# Patient Record
Sex: Male | Born: 1950 | Race: White | Hispanic: No | Marital: Married | Smoking: Former smoker
Health system: Southern US, Community
[De-identification: ages and names within clinical notes are randomized; demographics above are authoritative.]

## PROBLEM LIST (undated history)

## (undated) DIAGNOSIS — J449 Chronic obstructive pulmonary disease, unspecified: Secondary | ICD-10-CM

## (undated) DIAGNOSIS — F32A Depression, unspecified: Secondary | ICD-10-CM

## (undated) DIAGNOSIS — C801 Malignant (primary) neoplasm, unspecified: Secondary | ICD-10-CM

## (undated) DIAGNOSIS — E119 Type 2 diabetes mellitus without complications: Secondary | ICD-10-CM

## (undated) DIAGNOSIS — E1140 Type 2 diabetes mellitus with diabetic neuropathy, unspecified: Secondary | ICD-10-CM

## (undated) DIAGNOSIS — M545 Low back pain, unspecified: Secondary | ICD-10-CM

## (undated) DIAGNOSIS — R0600 Dyspnea, unspecified: Secondary | ICD-10-CM

## (undated) DIAGNOSIS — F329 Major depressive disorder, single episode, unspecified: Secondary | ICD-10-CM

## (undated) DIAGNOSIS — G4730 Sleep apnea, unspecified: Secondary | ICD-10-CM

## (undated) DIAGNOSIS — J988 Other specified respiratory disorders: Secondary | ICD-10-CM

## (undated) DIAGNOSIS — E7800 Pure hypercholesterolemia, unspecified: Secondary | ICD-10-CM

## (undated) HISTORY — DX: Other specified respiratory disorders: J98.8

## (undated) HISTORY — DX: Pure hypercholesterolemia, unspecified: E78.00

## (undated) HISTORY — DX: Type 2 diabetes mellitus without complications: E11.9

## (undated) HISTORY — DX: Sleep apnea, unspecified: G47.30

## (undated) HISTORY — DX: Low back pain: M54.5

## (undated) HISTORY — DX: Major depressive disorder, single episode, unspecified: F32.9

## (undated) HISTORY — DX: Depression, unspecified: F32.A

## (undated) HISTORY — DX: Type 2 diabetes mellitus with diabetic neuropathy, unspecified: E11.40

## (undated) HISTORY — DX: Dyspnea, unspecified: R06.00

## (undated) HISTORY — PX: CHOLECYSTECTOMY: SHX55

## (undated) HISTORY — PX: OTHER SURGICAL HISTORY: SHX169

## (undated) HISTORY — DX: Low back pain, unspecified: M54.50

---

## 2007-03-08 ENCOUNTER — Encounter: Payer: Self-pay | Admitting: Cardiology

## 2009-05-27 ENCOUNTER — Encounter: Payer: Self-pay | Admitting: Cardiology

## 2010-03-24 ENCOUNTER — Encounter: Payer: Self-pay | Admitting: Cardiology

## 2010-04-01 ENCOUNTER — Encounter: Payer: Self-pay | Admitting: Cardiology

## 2010-04-08 ENCOUNTER — Encounter (INDEPENDENT_AMBULATORY_CARE_PROVIDER_SITE_OTHER): Payer: Self-pay | Admitting: *Deleted

## 2010-04-08 ENCOUNTER — Ambulatory Visit: Payer: Self-pay | Admitting: Cardiology

## 2010-04-08 DIAGNOSIS — E1165 Type 2 diabetes mellitus with hyperglycemia: Secondary | ICD-10-CM

## 2010-04-08 DIAGNOSIS — E118 Type 2 diabetes mellitus with unspecified complications: Secondary | ICD-10-CM

## 2010-04-08 DIAGNOSIS — R030 Elevated blood-pressure reading, without diagnosis of hypertension: Secondary | ICD-10-CM

## 2010-04-08 DIAGNOSIS — E7800 Pure hypercholesterolemia, unspecified: Secondary | ICD-10-CM

## 2010-04-08 DIAGNOSIS — J069 Acute upper respiratory infection, unspecified: Secondary | ICD-10-CM | POA: Insufficient documentation

## 2010-04-08 DIAGNOSIS — R5381 Other malaise: Secondary | ICD-10-CM

## 2010-04-08 DIAGNOSIS — E785 Hyperlipidemia, unspecified: Secondary | ICD-10-CM

## 2010-04-08 DIAGNOSIS — J45909 Unspecified asthma, uncomplicated: Secondary | ICD-10-CM | POA: Insufficient documentation

## 2010-04-08 DIAGNOSIS — G4733 Obstructive sleep apnea (adult) (pediatric): Secondary | ICD-10-CM | POA: Insufficient documentation

## 2010-04-08 DIAGNOSIS — Z794 Long term (current) use of insulin: Secondary | ICD-10-CM

## 2010-04-08 DIAGNOSIS — J309 Allergic rhinitis, unspecified: Secondary | ICD-10-CM | POA: Insufficient documentation

## 2010-04-08 DIAGNOSIS — R5383 Other fatigue: Secondary | ICD-10-CM

## 2010-04-08 DIAGNOSIS — R9389 Abnormal findings on diagnostic imaging of other specified body structures: Secondary | ICD-10-CM | POA: Insufficient documentation

## 2010-04-08 DIAGNOSIS — E669 Obesity, unspecified: Secondary | ICD-10-CM | POA: Insufficient documentation

## 2010-04-08 DIAGNOSIS — R0602 Shortness of breath: Secondary | ICD-10-CM | POA: Insufficient documentation

## 2010-04-08 DIAGNOSIS — M545 Low back pain: Secondary | ICD-10-CM | POA: Insufficient documentation

## 2010-04-11 ENCOUNTER — Encounter: Payer: Self-pay | Admitting: Cardiology

## 2010-04-11 ENCOUNTER — Ambulatory Visit: Payer: Self-pay | Admitting: Cardiology

## 2010-05-05 ENCOUNTER — Encounter (INDEPENDENT_AMBULATORY_CARE_PROVIDER_SITE_OTHER): Payer: Self-pay | Admitting: *Deleted

## 2010-05-27 ENCOUNTER — Encounter (INDEPENDENT_AMBULATORY_CARE_PROVIDER_SITE_OTHER): Payer: Self-pay | Admitting: *Deleted

## 2010-06-02 ENCOUNTER — Encounter: Payer: Self-pay | Admitting: Cardiology

## 2010-07-01 ENCOUNTER — Encounter (INDEPENDENT_AMBULATORY_CARE_PROVIDER_SITE_OTHER): Payer: Self-pay | Admitting: *Deleted

## 2010-08-08 ENCOUNTER — Ambulatory Visit: Payer: Self-pay | Admitting: Pulmonary Disease

## 2010-08-08 DIAGNOSIS — J449 Chronic obstructive pulmonary disease, unspecified: Secondary | ICD-10-CM

## 2010-08-08 IMAGING — CR DG CHEST 2V
3 series · 3 of 3 positions shown · non-contrast
Comparison: None.

CLINICAL DATA: Shortness of breath.

CHEST - 2 VIEW

[view not recorded (1 of 3)]
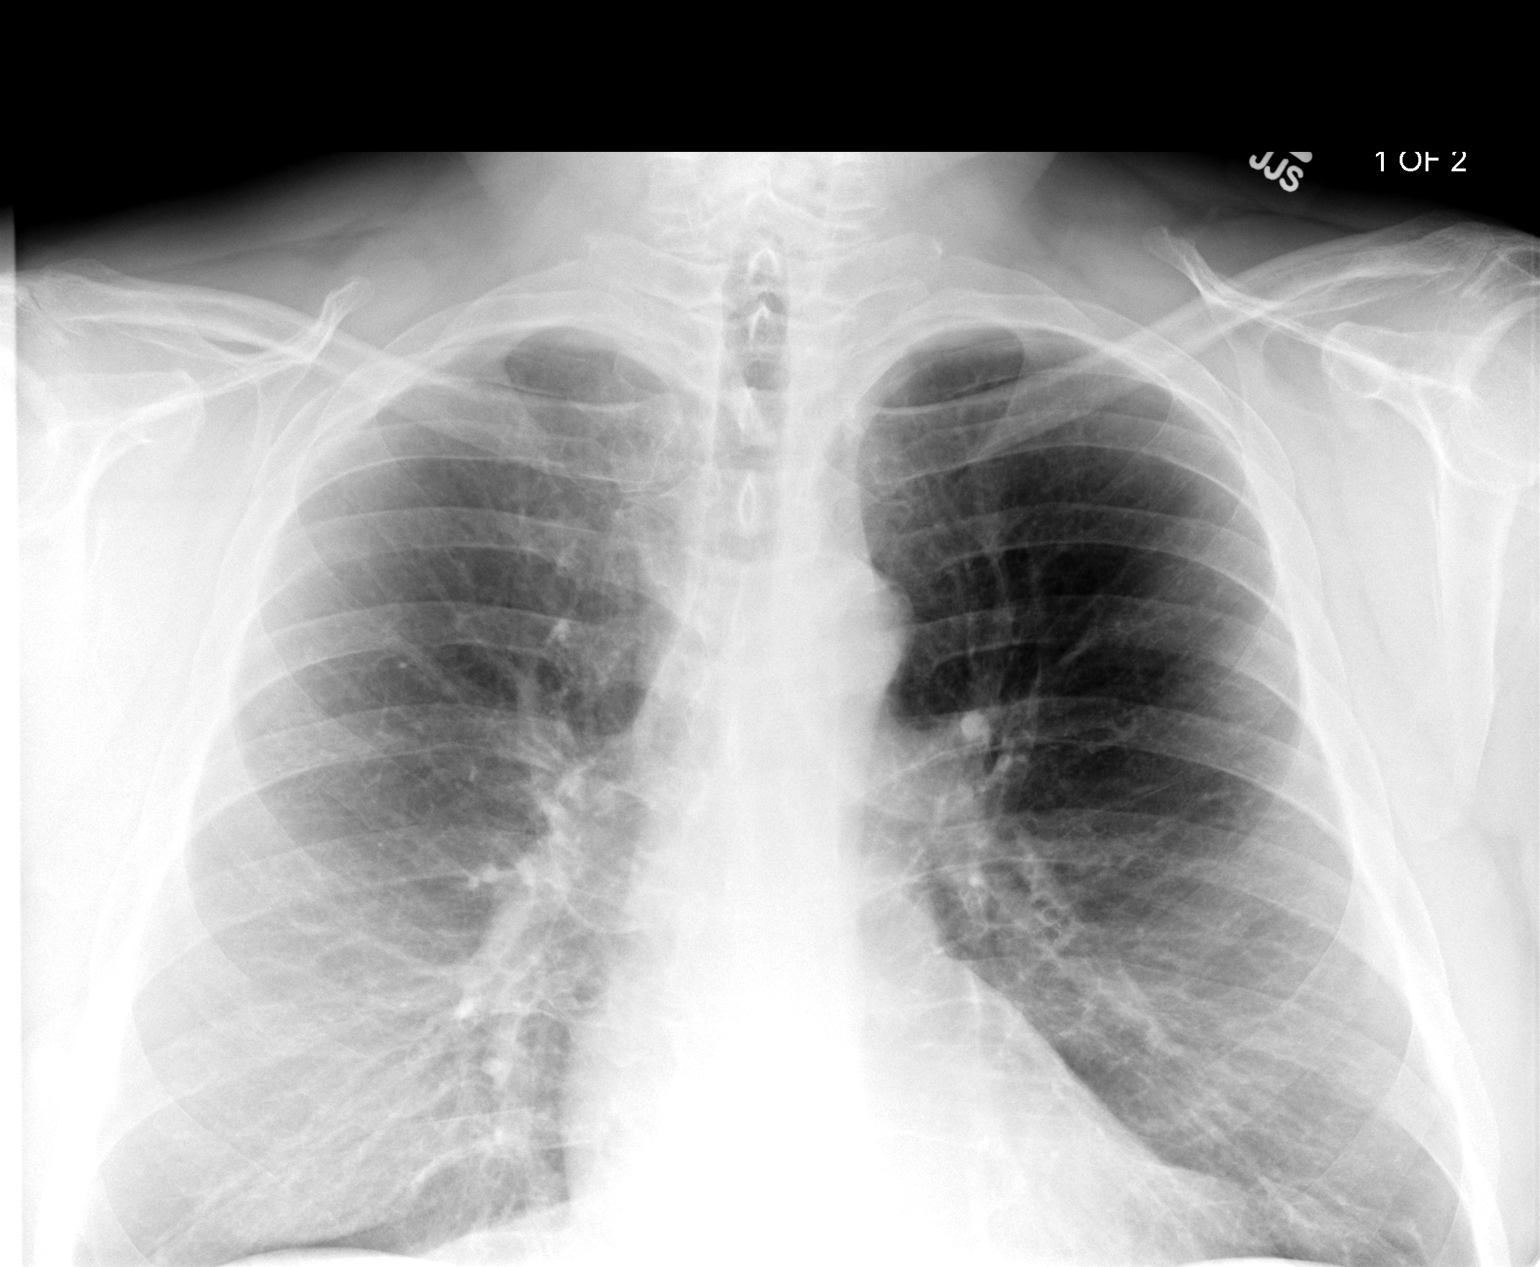

[view not recorded (2 of 3)]
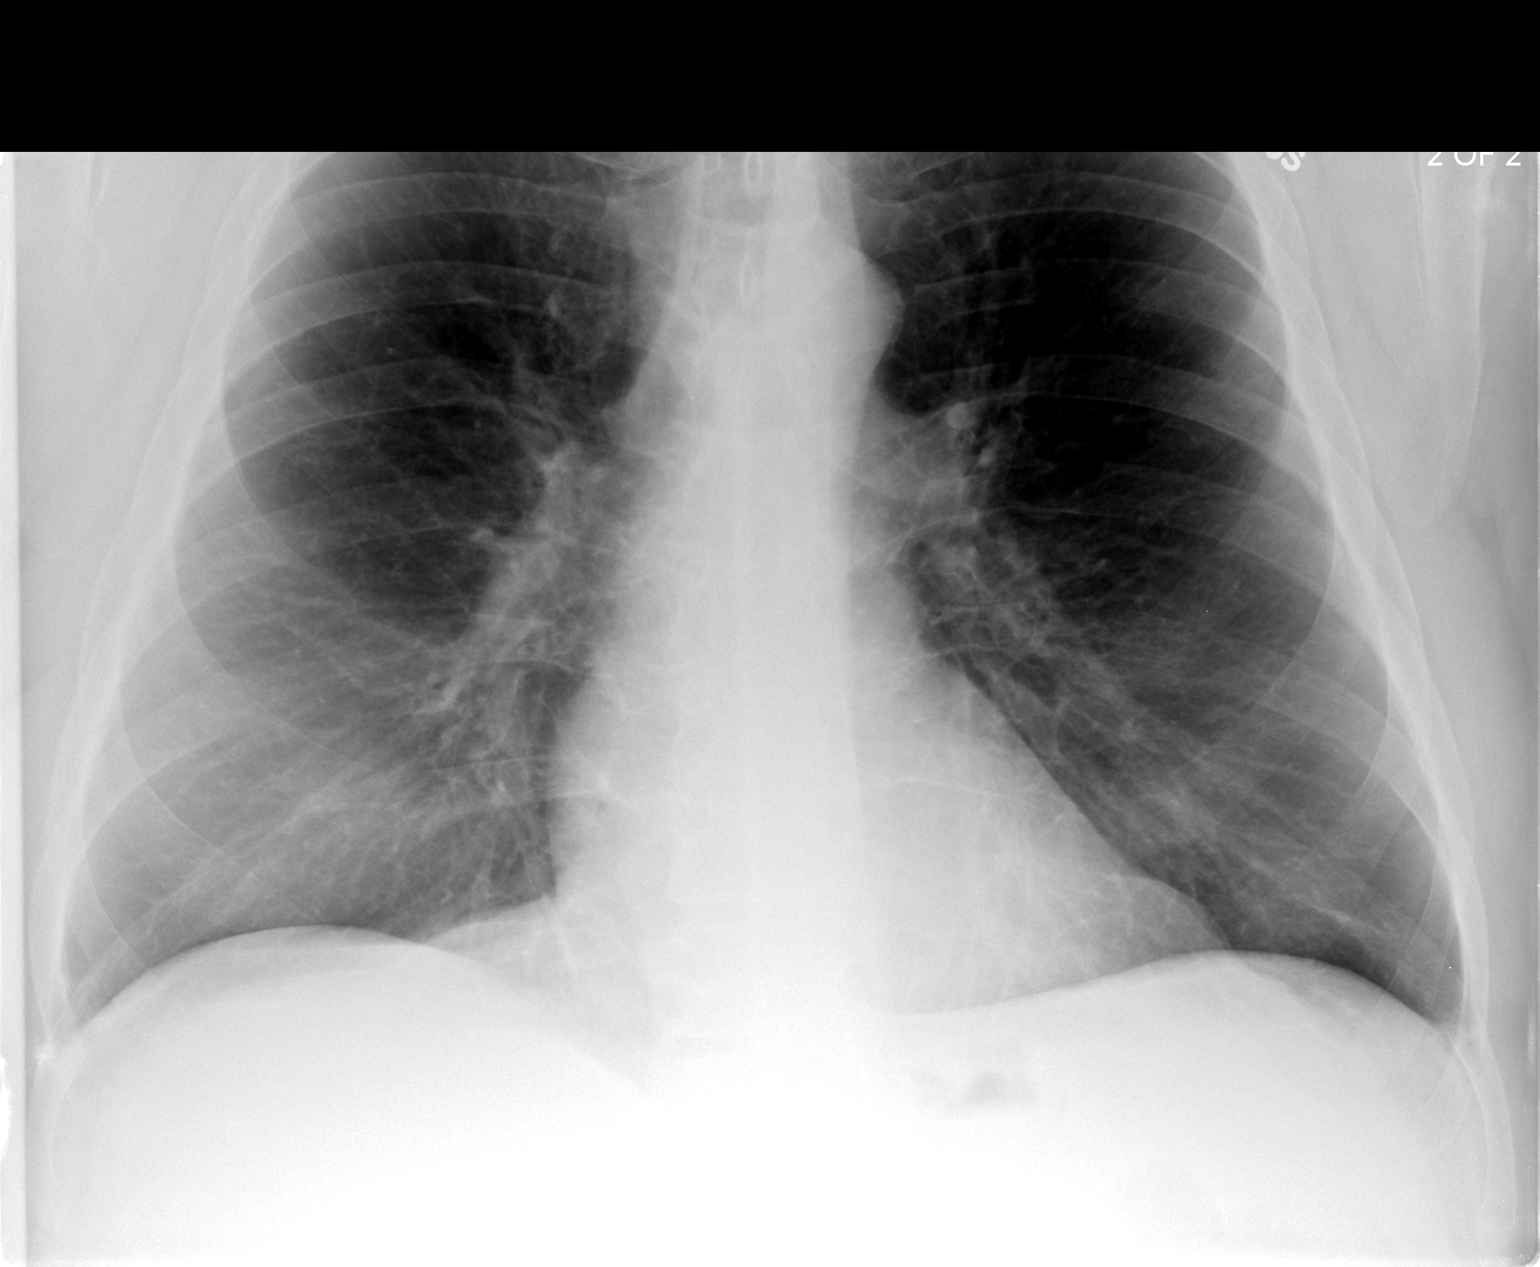

[view not recorded (3 of 3)]
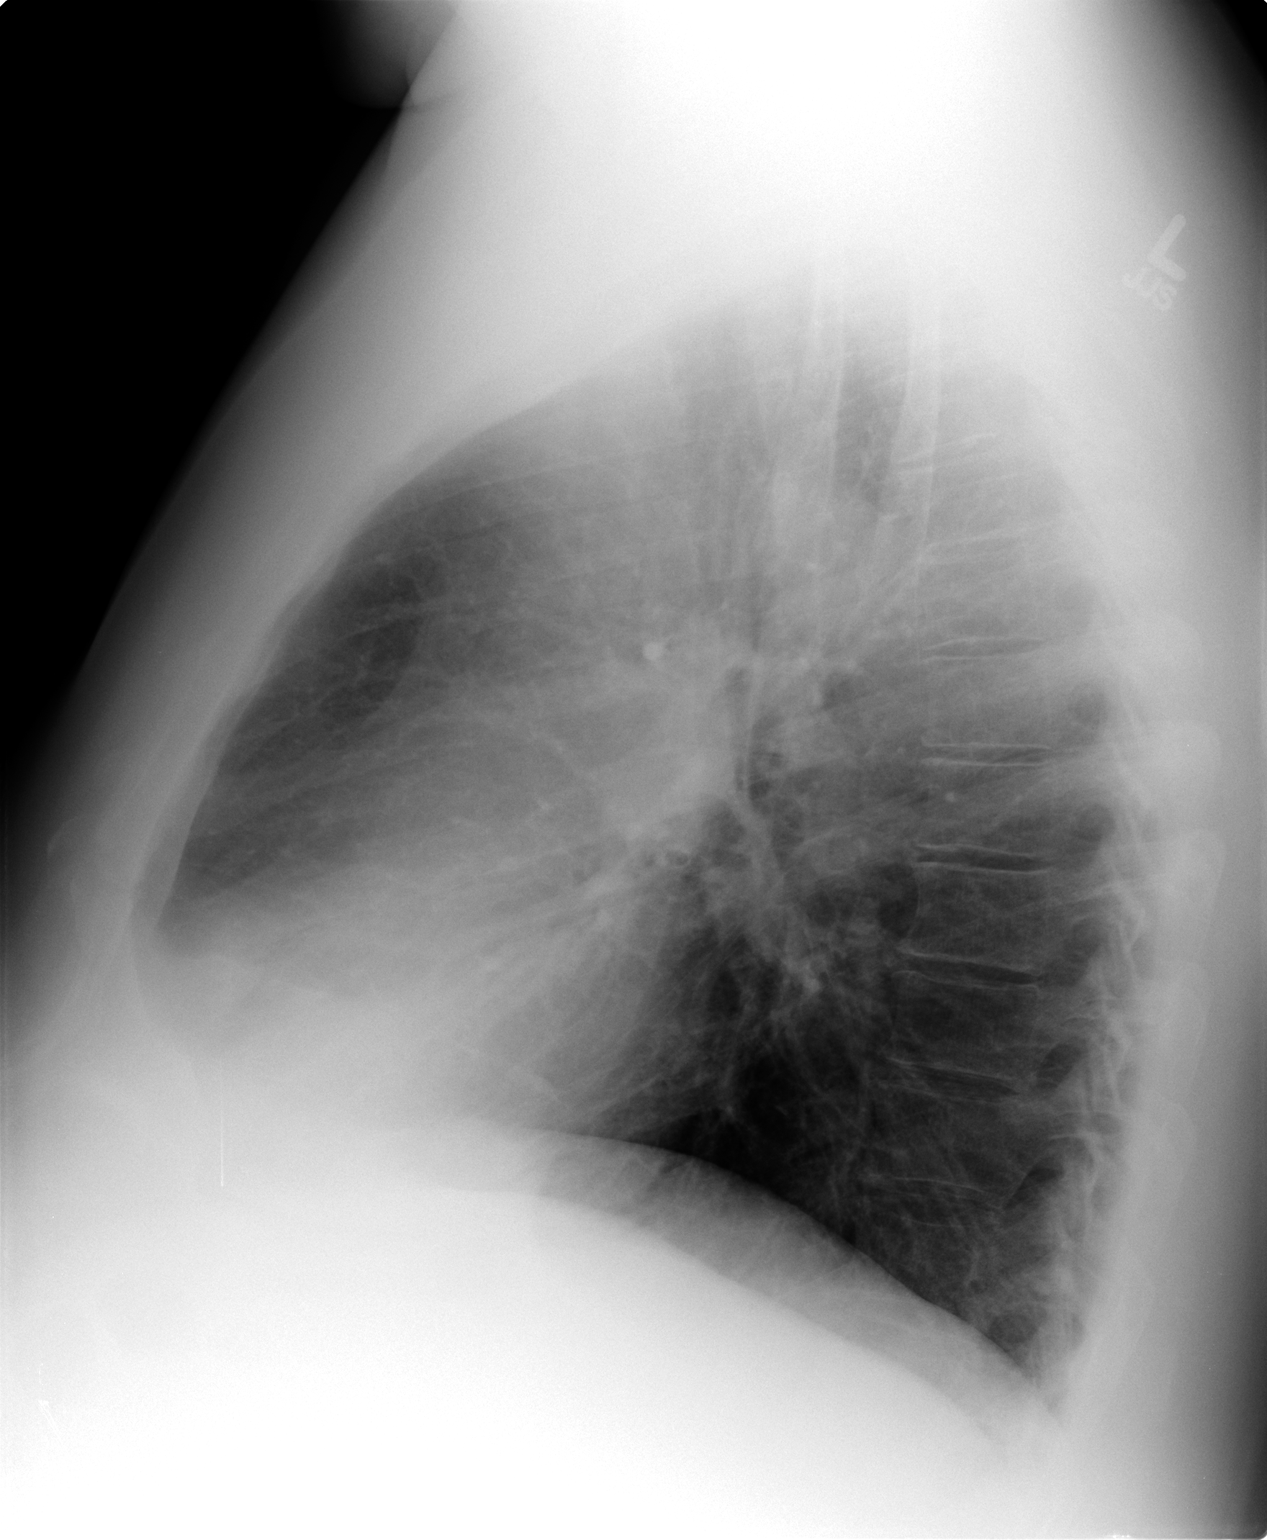

[3 of 3 positions shown; findings below may reference images not displayed]

FINDINGS: The chest appears hyperexpanded with attenuation of the
pulmonary vasculature.  There is no focal airspace disease or
effusion.  Heart size is normal.
IMPRESSION: Findings compatible with emphysema.  No acute disease.

## 2010-08-09 ENCOUNTER — Encounter: Payer: Self-pay | Admitting: Pulmonary Disease

## 2010-08-13 ENCOUNTER — Encounter: Payer: Self-pay | Admitting: Pulmonary Disease

## 2010-08-20 ENCOUNTER — Telehealth (INDEPENDENT_AMBULATORY_CARE_PROVIDER_SITE_OTHER): Payer: Self-pay | Admitting: *Deleted

## 2010-09-02 ENCOUNTER — Ambulatory Visit: Payer: Self-pay | Admitting: Cardiology

## 2010-09-03 ENCOUNTER — Ambulatory Visit: Payer: Self-pay | Admitting: Pulmonary Disease

## 2010-09-11 ENCOUNTER — Encounter: Payer: Self-pay | Admitting: Cardiology

## 2010-10-20 ENCOUNTER — Ambulatory Visit: Admit: 2010-10-20 | Payer: Self-pay | Admitting: Cardiology

## 2010-10-20 ENCOUNTER — Encounter: Payer: Self-pay | Admitting: Cardiology

## 2010-11-04 NOTE — Letter (Signed)
Summary: External Correspondence/ [facility]  External Correspondence/ [facility]   Imported By: Dorise Hiss 03/28/2010 10:28:50  _____________________________________________________________________  External Attachment:    Type:   Image     Comment:   External Document

## 2010-11-04 NOTE — Assessment & Plan Note (Signed)
Summary: NP-DYSPNEA   Visit Type:  Initial Consult Primary Provider:  Dr. Doreen Beam  CC:  Dyspnea.  History of Present Illness: The patient presents for evaluation of an abnormal echo and dyspnea.  He has no prior cardiac history but has multiple cardiovascular risk factors.  He does report a stress test some years ago that apparently was normal. Recently he had symptoms that he thought similar to previous bronchitis. He had some chest discomfort, shortness of breath and cough productive of phlegm. There was mild fevers and chills. He was treated with ciprofloxacin with improvement. As further workup he did have an echocardiogram. This demonstrated a well preserved ejection fraction. However, there was suggestion of small pericardial effusion. There were no significant valvular abnormalities.  The patient says that following antibiotics he has felt well. He is not having any of the cough or shortness of breath that he was having. He was having some chest discomfort he related to lifting weights and shooting arrows.  He has had no recent chest pain.  He is limited by back pain but is able to do some activities and denies bringing on any chest, neck, arm discomfort. He denies any palpitations, presyncope or syncope.  Preventive Screening-Counseling & Management  Alcohol-Tobacco     Smoking Status: quit     Year Quit: 6 years ago  Current Medications (verified): 1)  Lantus 100 Unit/ml Soln (Insulin Glargine) .... 70 Units Two Times A Day 2)  Gabapentin 300 Mg Caps (Gabapentin) .... Take 1 Tablet By Mouth Two Times A Day 3)  Afrin Nasal Spray 0.05 % Soln (Oxymetazoline Hcl) .... One To Two Sprays Each Nostril Once To Two Times A Day 4)  Zoloft 100 Mg Tabs (Sertraline Hcl) .... Take 2 Tablet By Mouth Once A Day 5)  Ibuprofen 800 Mg Tabs (Ibuprofen) .... Take 1 Tablet By Mouth Three Times A Day As Needed 6)  Ventolin Hfa 108 (90 Base) Mcg/act Aers (Albuterol Sulfate) .... As Needed 7)  Novolog  Flexpen 100 Unit/ml Soln (Insulin Aspart) .... 20 Units Every Morning and 24 Units Every Evening 8)  Metformin Hcl 500 Mg Tabs (Metformin Hcl) .... Two Tabs Two Times A Day 9)  Lisinopril 10 Mg Tabs (Lisinopril) .... Take 1 Tablet By Mouth Once A Day 10)  Potassium Gluconate 550 Mg Tabs (Potassium Gluconate) .... 3 Tabs Once Daily  Allergies: 1)  ! * Pencillin  Past History:  Past Medical History: DM neuropathy Respiratory Infections Low Back pain Asthma Dyspnea Sleep Apnea on CPAP Depression Diabetes Type 2 mid 1990s Pure Hypercholesterolemia x years  Past Surgical History: Cholecystectomy Left eye enucleation following trauma  Social History: Retired Emergency planning/management officer Tobacco use 1 1/2 PPD 20 yrs (quit 6 years ago) Non drinker Smoking Status:  quit  Review of Systems       As stated in the HPI and negative for all other systems.   Vital Signs:  Patient profile:   60 year old male Height:      76 inches Weight:      357.75 pounds BMI:     43.70 O2 Sat:      95 % on Room air Pulse rate:   83 / minute BP sitting:   138 / 78  (left arm) Cuff size:   large  Vitals Entered By: Hoover Brunette, LPN (April 08, 8294 1:50 PM)  Nutrition Counseling: Patient's BMI is greater than 25 and therefore counseled on weight management options.  O2 Flow:  Room air  CC: Dyspnea Is Patient Diabetic? Yes Comments referred by Dr. Sherril Croon for dyspnea.  Echo done at their office.   Physical Exam  General:  Well developed, well nourished, in no acute distress. Head:  normocephalic and atraumatic Eyes:  Status post left eye enucleation, right pupil round and reactive and fundi unremarkable Mouth:  Teeth, gums and palate normal. Oral mucosa normal. Neck:  Neck supple, no JVD. No masses, thyromegaly or abnormal cervical nodes. Chest Wall:  no deformities or breast masses noted Lungs:  Clear bilaterally to auscultation and percussion. Abdomen:  Bowel sounds positive; abdomen soft and  non-tender without masses, organomegaly, or hernias noted. No hepatosplenomegaly, obese.  (Exam compromised by morbid obesity) Msk:  Back normal, normal gait. Muscle strength and tone normal. Extremities:  No clubbing or cyanosis. Neurologic:  Alert and oriented x 3. Skin:  Intact without lesions or rashes. Cervical Nodes:  no significant adenopathy Inguinal Nodes:  no significant adenopathy Psych:  Normal affect.   Detailed Cardiovascular Exam  Neck    Carotids: Carotids full and equal bilaterally without bruits.      Neck Veins: Normal, no JVD.    Heart    Inspection: no deformities or lifts noted.      Palpation: normal PMI with no thrills palpable.      Auscultation: regular rate and rhythm, S1, S2 without murmurs, rubs, gallops, or clicks.    Vascular    Abdominal Aorta: no palpable masses, pulsations, or audible bruits.      Femoral Pulses: normal femoral pulses bilaterally.      Pedal Pulses: normal pedal pulses bilaterally.      Radial Pulses: normal radial pulses bilaterally.      Peripheral Circulation: no clubbing, cyanosis, or edema noted with normal capillary refill.     EKG  Procedure date:  03/24/2010  Findings:      Sinus rhythm, rate 85, QTC prolonged, no acute ST-T wave changes  Impression & Recommendations:  Problem # 1:  ECHOCARDIOGRAM, ABNORMAL (ICD-793.2) The patient had a small pericardial effusion. I suggest this is probably not clinically or hemodynamically significant but should be followed up with a repeat echo. I will schedule this for 4 months.  Problem # 2:  DYSLIPIDEMIA (ICD-272.4) He was previously on Zocor 80 mg but this drug prescription ran out. I have taken the liberty of starting Lipitor 40 mg. He will get a lipid and liver profile in 8 weeks and I have suggested that he continue to follow with his primary physician and reestablish with his endocrinologist. Apparently his hemoglobin A1c was very elevated as well. We discussed the  importance of risk reduction.  Of note his LDL was 127 HDL 31 recently.  Problem # 3:  DYSPNEA (ICD-786.05) I would not suggest obstructive coronary disease based on the above symptoms no dyspnea could be an anginal equivalent. He has significant risk factors and exercise treadmill testing is indicated.  Problem # 4:  OBESITY, UNSPECIFIED (ICD-278.00) We discussed at length the need for weight loss with diet and exercise.  Other Orders: GXT (GXT) Future Orders: T-Lipid Profile ([id number]) ... 05/26/2010 T-Hepatic Function [phone]) ... 05/26/2010  Patient Instructions: 1)  Your physician has requested that you have an echocardiogram.  Echocardiography is a painless test that uses sound waves to create images of your heart. It provides your doctor with information about the size and shape of your heart and how well your heart's chambers and valves are working.  This procedure takes approximately one hour. There are  no restrictions for this procedure. DO IN 4 MONTHS. If the results of your test are normal or stable, you will receive a letter. If they are abnormal, the nurse will contact you by phone.  2)  Your physician has requested that you have an exercise tolerance test.  For further information please visit [email or web address]  Please also follow instruction sheet, as given. If the results of your test are normal or stable, you will receive a letter. If they are abnormal, the nurse will contact you by phone.  3)  Start Lipitor 40mg  by mouth at bedtime. 4)  Your physician recommends that you go to the [facility] for a FASTING lipid profile and liver function labs: DO IN 8 WEEKS. AROUND 06/03/10. Do not eat or drink after midnight.  Prescriptions: LIPITOR 40 MG TABS (ATORVASTATIN CALCIUM) Take one tablet by mouth daily.  #30 x 6   Entered by:   Cyril Loosen, RN, BSN   Authorized by:   Rollene Rotunda, MD, [facility]   Signed by:   Cyril Loosen, RN, BSN on 04/08/2010   Method used:    Electronically to        [facility] # [phone]* (retail)       [address]       [place], [place]  [address]       Ph: [phone] or [phone]       Fax: [phone]   RxID:   [id number]   I have reviewed and approved all prescriptions at the time of this visit. Rollene Rotunda, MD, [facility]  April 08, 2010 2:47 PM

## 2010-11-04 NOTE — Medication Information (Signed)
Summary: RX Folder/ [facility]  RX Folder/ [facility]   Imported By: Dorise Hiss 03/28/2010 10:35:27  _____________________________________________________________________  External Attachment:    Type:   Image     Comment:   External Document

## 2010-11-04 NOTE — Letter (Signed)
Summary: Graded Exercise Tolerance Test  [facility] [address]   [place], [place] [address]   Phone: [phone]  Fax: [phone]      [facility] Cardiovascular Services  Graded Exercise Tolerance Test    [facility]  Appointment Date:_  Appointment Time:_   Your doctor has ordered a stress test to help determine the condition of your  heart during exercise. If you take blood pressure medicine , ask your doctor if you should take it the day of your test. You may eat a light meal before your test.  Please be sure to bring the copy of your order with you.   You should dress comfortably, for example: Sweat pants, shorts, or skirt, Loose                                                                                                       short sleeved T-shirt, Rubber soled lace-up shoes (tennis shoes)  You will need to arrive 15 minutes before your appointment time. You will also need to enter at the Main Entrance of the hospital and go to the registration desk. They will direct you to the Cardiovascular Department on the third floor.  You will need to plan on being at the hospital for one hour from registration for this appointment.    You may take your medications the morning of your test.  If the results of your test are normal or stable, you will receive a letter. If they are abnormal, the nurse will contact you by phone.

## 2010-11-04 NOTE — Letter (Signed)
Summary: Engineer, materials at [facility]  [address]   [place], [place] [address]   Phone: [phone]  Fax: [phone]        July 01, 2010 MRN: [id number]   [facility] [address] [place], [place]  [address]    Dear Mr. Nater,  Your test ordered by Selena Batten has been reviewed by your physician (or physician assistant) and was found to be normal or stable. Your physician (or physician assistant) felt no changes were needed at this time.  ____ Echocardiogram  ____ Cardiac Stress Test  __X__ Lab Work (Cholesterol)  ____ Peripheral vascular study of arms, legs or neck  ____ CT scan or X-ray  ____ Lung or Breathing test  ____ Other:   Thank you.   Cyril Loosen, RN, BSN    Duane Boston, M.D., F.A.C.C. Thressa Sheller, M.D., F.A.C.C. Oneal Grout, M.D., F.A.C.C. Cheree Ditto, M.D., F.A.C.C. Daiva Nakayama, M.D., F.A.C.C. Kenney Houseman, M.D., F.A.C.C. Jeanne Ivan, PA-C

## 2010-11-04 NOTE — Letter (Signed)
Summary: Generic Engineer, agricultural at [facility] [address]   [place], [place] [address]   Phone: [phone]  Fax: [phone]        May 27, 2010 MRN: [id number]    [facility] [address] [place], [place]  [address]    Dear Mr. Lutzke,   According to our records, you are due to have lab work done. Please take the enclosed order to the [facility] to have this done. Do not eat or drink after midnight.       Sincerely,  Cyril Loosen, RN, BSN  This letter has been electronically signed by your physician.

## 2010-11-04 NOTE — Assessment & Plan Note (Signed)
Summary: dyspnea/jd   Visit Type:  Initial Consult Copy to:  pcp Primary Provider/Referring Provider:  Dr. Doreen Beam  CC:  Pulmonary Consult.  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 Cannot do diet or exercise, can barely walk in the parking lot told 'asthma' many years ago, has used inhalers & diskus, Uses ventolin MDI 2-4 puffs 4-5 times/d Retains water, has  gained wt  - 12 lbs in last  , on CPAP x 14 years, compliant , helpful Not taking lisinopril  c/o wheezing on perfumes, cooking c/o dry cough, not taking lisinopril He smoked > 60 Pyrs before quitting in 2006. Echo in june '11 showed nml LV fn, small pericardial effusion, grossly nml valves. Stress test reportedly nml. A repeat echo has been scheduled by Dr Antoine Poche  He had bronchitic symptoms in july treated with cipro. Spirometry showed severe airway obstruction with a low FEV1 ratio & FEV1 of 25 %, good effort !   Preventive Screening-Counseling & Management  Alcohol-Tobacco     Alcohol drinks/day: 0     Smoking Status: quit     Packs/Day: 2.0     Year Started: 1966     Year Quit: 2006  Current Medications (verified): 1)  Lantus 100 Unit/ml Soln (Insulin Glargine) .... 70 Units Two Times A Day 2)  Gabapentin 300 Mg Caps (Gabapentin) .... Take 1 Tablet By Mouth Two Times A Day 3)  Afrin Nasal Spray 0.05 % Soln (Oxymetazoline Hcl) .... One To Two Sprays Each Nostril Once To Two Times A Day 4)  Zoloft 100 Mg Tabs (Sertraline Hcl) .... Take 2 Tablet By Mouth Once A Day 5)  Ibuprofen 200 Mg Tabs (Ibuprofen) .... As Needed 6)  Ventolin Hfa 108 (90 Base) Mcg/act Aers (Albuterol Sulfate) .... As Needed 7)  Novolog Flexpen 100 Unit/ml Soln (Insulin Aspart) .... 20 Units Every Morning and 24 Units Every Evening 8)  Metformin Hcl 500 Mg Tabs (Metformin Hcl) .... Two Tabs Two Times A Day 9)  Lisinopril 10 Mg Tabs (Lisinopril) .... Take 1 Tablet By  Mouth Once A Day 10)  Potassium Gluconate 550 Mg Tabs (Potassium Gluconate) .... Take 2 Tab By Mouth At Bedtime 11)  Lipitor 40 Mg Tabs (Atorvastatin Calcium) .... Take One Tablet By Mouth Daily. 12)  Furosemide 40 Mg Tabs (Furosemide) .... Take 1 Tablet By Mouth Once A Day As Needed For Leg Swelling 13)  Klor-Con M10 10 Meq Cr-Tabs (Potassium Chloride Crys Cr) .... Take 1 Tablet By Mouth Once A Day When Taking Furosemide 14)  Cpap .Marland KitchenMarland KitchenMarland Kitchen 15  Allergies (verified): 1)  ! * Pencillin  Past History:  Past Medical History: Last updated: 2010-04-14 DM neuropathy Respiratory Infections Low Back pain Asthma Dyspnea Sleep Apnea on CPAP Depression Diabetes Type 2 mid 1990s Pure Hypercholesterolemia x years  Past Surgical History: Last updated: 04-14-2010 Cholecystectomy Left eye enucleation following trauma  Family History: Last updated: 04/14/2010 Father:deceased Perrin Maltese Mother:DM  Social History: Last updated: 14-Apr-2010 Retired Emergency planning/management officer Tobacco use 1 1/2 PPD 20 yrs (quit 6 years ago) Non drinker  Social History: Packs/Day:  2.0 Alcohol drinks/day:  0  Review of Systems       The patient complains of shortness of breath with activity, shortness of breath at rest, and productive cough.  The patient denies non-productive cough, coughing up blood, chest pain, irregular heartbeats, acid heartburn, indigestion, loss of appetite, weight change, abdominal pain, difficulty swallowing, sore throat, tooth/dental problems,  headaches, nasal congestion/difficulty breathing through nose, sneezing, itching, ear ache, anxiety, depression, hand/feet swelling, joint stiffness or pain, rash, change in color of mucus, and fever.    Vital Signs:  Patient profile:   60 year old male Height:      76 inches Weight:      361 pounds BMI:     44.10 O2 Sat:      93 % on Room air Temp:     97.6 degrees F oral Pulse rate:   80 / minute BP sitting:   150 / 72  (left arm) Cuff size:    large  Vitals Entered By: Zackery Barefoot CMA (August 08, 2010 3:45 PM)  O2 Flow:  Room air CC: Pulmonary Consult Comments Medications reviewed with patient Verified contact number and pharmacy with patient Zackery Barefoot [facility]  August 08, 2010 3:46 PM    Physical Exam  Additional Exam:  wt 361 August 08, 2010  Gen. Pleasant, well-nourished, in no distress, normal affect ENT - no lesions, no post nasal drip, class 2 airway Neck: No JVD, no thyromegaly, no carotid bruits Lungs: no use of accessory muscles, no dullness to percussion, decresaed BL without rales or rhonchi  Cardiovascular: Rhythm regular, heart sounds  normal, no murmurs or gallops, no peripheral edema Abdomen: soft and non-tender, no hepatosplenomegaly, BS normal. Musculoskeletal: No deformities, no cyanosis or clubbing Neuro:  alert, non focal     Impression & Recommendations:  Problem # 1:  C O P D (ICD-496) He does seem to have significant airway obstruction - likley COPD although he does give h/o 'asthma' from a young age Will trial symbicort first & try to decrease his usage of rescue inhaler If persistent dyspnea, consider adding spiriva in future Symbicort 160/4.5 2 puffs two times a day -MAINTENANCE Use ventolin 2 puffs as needed for RESCUE   Problem # 2:  OBESITY, UNSPECIFIED (ICD-278.00) weight loss  Problem # 3:  UNSPECIFIED SLEEP APNEA (ICD-780.57)  Compliance encouraged, wt loss emphasized, asked to avoid meds with sedative side effects, cautioned against driving when sleepy.  Unclear setiings, he does not have a local DME or a download-able machine  Orders: Consultation Level IV (53664) Spirometry w/Graph (94010)  Medications Added to Medication List This Visit: 1)  Ibuprofen 200 Mg Tabs (Ibuprofen) .... As needed 2)  Potassium Gluconate 550 Mg Tabs (Potassium gluconate) .... Take 2 tab by mouth at bedtime 3)  Furosemide 40 Mg Tabs (Furosemide) .... Take 1 tablet by mouth once a day  as needed for leg swelling 4)  Klor-con M10 10 Meq Cr-tabs (Potassium chloride crys cr) .... Take 1 tablet by mouth once a day when taking furosemide 5)  Cpap  .Marland KitchenMarland Kitchen. 15  Other Orders: T-2 View CXR ([id number])  Patient Instructions: 1)  Copy sent to: Dr Sherril Croon, Dr Antoine Poche 2)  Please schedule a follow-up appointment in 3 weeks. 3)  A chest x-ray has been recommended.  Your imaging study may require preauthorization.  4)  Symbicort 160/4.5 2 puffs two times a day -MAINTENANCE 5)  Use ventolin 2 puffs as needed for RESCUE  6)  Weight loss 7)  Spirometry    Not Administered:    Influenza Vaccine not given due to: declined

## 2010-11-04 NOTE — Progress Notes (Signed)
Summary: Needs Echo  Phone Note Outgoing Call Call back at Home Phone [phone]   Call placed by: Cyril Loosen, RN, BSN,  August 20, 2010 10:01 AM Summary of Call: Left message to call back on voicemail regarding Echo that is due in November. Pt also does not have f/u appt scheduled or pending.  Initial call taken by: Cyril Loosen, RN, BSN,  August 20, 2010 10:02 AM  Follow-up for Phone Call        Pt would like to have echo scheduled. He will f/u with Dr. Antoine Poche on Monday, October 20, 2010. Follow-up by: Cyril Loosen, RN, BSN,  August 20, 2010 4:44 PM

## 2010-11-04 NOTE — Letter (Signed)
Summary: Engineer, materials at [facility]  [address]   [place], [place] [address]   Phone: [phone]  Fax: [phone]        May 05, 2010 MRN: [id number]   [facility] [address] [place], [place]  [address]   Dear Kenneth Patrick,  Your test ordered by Selena Batten has been reviewed by your physician (or physician assistant) and was found to be normal or stable. Your physician (or physician assistant) felt no changes were needed at this time.  ____ Echocardiogram  _X___ Cardiac Stress Test  ____ Lab Work  ____ Peripheral vascular study of arms, legs or neck  ____ CT scan or X-ray  ____ Lung or Breathing test  ____ Other:   Thank you.   Cyril Loosen, RN, BSN    Duane Boston, M.D., F.A.C.C. Thressa Sheller, M.D., F.A.C.C. Oneal Grout, M.D., F.A.C.C. Cheree Ditto, M.D., F.A.C.C. Daiva Nakayama, M.D., F.A.C.C. Kenney Houseman, M.D., F.A.C.C. Jeanne Ivan, PA-C

## 2010-11-04 NOTE — Miscellaneous (Signed)
Summary: Symbicort rx  Clinical Lists Changes  Medications: Added new medication of SYMBICORT 160-4.5 MCG/ACT AERO (BUDESONIDE-FORMOTEROL FUMARATE) Inhale 2 puffs two times a day - Signed Rx of SYMBICORT 160-4.5 MCG/ACT AERO (BUDESONIDE-FORMOTEROL FUMARATE) Inhale 2 puffs two times a day;  #1 month x 2;  Signed;  Entered by: Zackery Barefoot CMA;  Authorized by: Comer Locket Vassie Loll MD;  Method used: Electronically to [facility] # [phone]*, [address] [place], [place]  [address], Ph: [phone] or [phone], Fax: [phone]    Prescriptions: SYMBICORT 160-4.5 MCG/ACT AERO (BUDESONIDE-FORMOTEROL FUMARATE) Inhale 2 puffs two times a day  #1 month x 2   Entered by:   Zackery Barefoot CMA   Authorized by:   Comer Locket. Vassie Loll MD   Signed by:   Zackery Barefoot CMA on 08/13/2010   Method used:   Electronically to        [facility] # [phone]* (retail)       [address]       [place], [place]  [address]       Ph: [phone] or [phone]       Fax: [phone]   RxID:   [id number]

## 2010-11-04 NOTE — Assessment & Plan Note (Signed)
Summary: [name]   Visit Type:  Follow-up Copy to:  pcp Primary Provider/Referring Provider:  Dr. Doreen Beam  CC:  3 week follow up. Pt states breathing is better and has only used rescue inhaler 3 times since starting Symbicort. Pt c/o leg spasms worse when sleeping.  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  Initial OV Nov 4 '11 told 'asthma' many years ago, has used inhalers & diskus, Uses ventolin MDI 2-4 puffs 4-5 times/d Retains water, has  gained wt  - 12 lbs in last  , on CPAP x 14 years, compliant , helpful Not taking lisinopril  c/o wheezing on perfumes, cooking c/o dry cough, not taking lisinopril He smoked > 60 Pyrs before quitting in 2006. Echo in june '11 showed nml LV fn, small pericardial effusion, grossly nml valves. Stress test reportedly nml. A repeat echo has been scheduled by Dr Antoine Poche  He had bronchitic symptoms in july treated with cipro. Spirometry showed severe airway obstruction with a low FEV1 ratio & FEV1 of 25 %, good effort !  September 03, 2010 3:04 PM  Symbicort helped - breathing better, not enthusiasatic about pulm rehab program, Echo done today c/o nasal congestion, muscle cramps  Gets CPAP supplies from online website   Current Medications (verified): 1)  Lantus 100 Unit/ml Soln (Insulin Glargine) .... 70 Units Two Times A Day 2)  Gabapentin 300 Mg Caps (Gabapentin) .... Take 1 Tablet By Mouth Two Times A Day 3)  Afrin Nasal Spray 0.05 % Soln (Oxymetazoline Hcl) .... One To Two Sprays Each Nostril Once To Two Times A Day 4)  Zoloft 100 Mg Tabs (Sertraline Hcl) .... Take 2 Tablet By Mouth Once A Day 5)  Ibuprofen 200 Mg Tabs (Ibuprofen) .... As Needed 6)  Ventolin Hfa 108 (90 Base) Mcg/act Aers (Albuterol Sulfate) .... As Needed 7)  Novolog Flexpen 100 Unit/ml Soln (Insulin Aspart) .... 20 Units Every Morning and 24 Units Every Evening 8)  Metformin Hcl 500 Mg Tabs (Metformin Hcl) .... Two Tabs  Two Times A Day 9)  Potassium Gluconate 550 Mg Tabs (Potassium Gluconate) .... Take 2 Tab By Mouth At Bedtime 10)  Lipitor 40 Mg Tabs (Atorvastatin Calcium) .... Take One Tablet By Mouth Daily. 11)  Furosemide 40 Mg Tabs (Furosemide) .... Take 1 Tablet By Mouth Once A Day As Needed For Leg Swelling 12)  Klor-Con M10 10 Meq Cr-Tabs (Potassium Chloride Crys Cr) .... Take 1 Tablet By Mouth Once A Day When Taking Furosemide 13)  Cpap .Marland KitchenMarland Kitchen. 15 14)  Symbicort 160-4.5 Mcg/act Aero (Budesonide-Formoterol Fumarate) .... Inhale 2 Puffs Two Times A Day  Allergies (verified): 1)  ! 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  The patient denies anorexia, fever, weight loss, weight gain, vision loss, decreased hearing, hoarseness, chest pain, syncope, prolonged cough, headaches, hemoptysis, abdominal pain, melena, hematochezia, severe indigestion/heartburn, hematuria, muscle weakness, suspicious skin lesions, difficulty walking, depression, unusual weight change, abnormal bleeding, enlarged lymph nodes, and angioedema.    Vital Signs:  Patient profile:   60 year old male  Height:      76 inches Weight:      373.8 pounds BMI:     45.66 O2 Sat:      97 % on Room air Temp:     98.0 degrees F oral Pulse rate:   75 / minute BP sitting:   132 / 72  (left arm) Cuff size:   large  Vitals Entered By: Zackery Barefoot CMA (September 03, 2010 2:47 PM)  O2 Flow:  Room air CC: 3 week follow up. Pt states breathing is better, has only used rescue inhaler 3 times since starting Symbicort. Pt c/o leg  spasms worse when sleeping Comments Medications reviewed with patient Verified contact number and pharmacy with patient Zackery Barefoot [facility]  September 03, 2010 2:50 PM    Physical Exam  Additional Exam:  wt 361 August 08, 2010  274 September 03, 2010  Gen. Pleasant, well-nourished, in no distress, normal affect ENT - no lesions, no post nasal drip, class 2 airway Neck: No JVD, no thyromegaly, no carotid bruits Lungs: no use of accessory muscles, no dullness to percussion, decresaed BL without rales or rhonchi  Cardiovascular: Rhythm regular, heart sounds  normal, no murmurs or gallops, no peripheral edema Musculoskeletal: No deformities, no cyanosis or clubbing      Impression & Recommendations:  Problem # 1:  C O P D (ICD-496) Assessment Improved ct symbicort He wants to hold off spiriva for now - OK Tried to convince him to start pulm rehab program - he is very resistant to this flu shot  Problem # 2:  UNSPECIFIED SLEEP APNEA (ICD-780.57)  Compliance encouraged, wt loss emphasized, asked to avoid meds with sedative side effects, cautioned against driving when sleepy.   Orders: Est. Patient Level III (62952)  Patient Instructions: 1)  Copy sent to: Dr Sherril Croon 2)  Please schedule a follow-up appointment in 3 months. 3)  Think about pulmonary rehab program 4)  Flu shot  Appended Document: flu vaccine documentation Flu Vaccine Consent Questions     Do you have a history of severe allergic reactions to this vaccine? no    Any prior history of allergic reactions to egg and/or gelatin? no    Do you have a sensitivity to the preservative Thimersol? no    Do you have a past history of Guillan-Barre Syndrome? no    Do you currently have an acute febrile illness? no    Have you ever had a severe reaction to latex? no    Vaccine information given and explained to patient? yes    Are you currently pregnant? no    Lot Number:[id number]   Exp Date:04/04/2011   Site Given  Left  Deltoid IM  given at ov w/ RA on 09-03-10. Boone Master CNA/MA  September 04, 2010 10:05 AM   Clinical Lists Changes  Orders: Added new Service order of Admin 1st Vaccine (84132) - Signed Added new Service order of Flu Vaccine 67yrs + [phone]) - Signed Observations: Added new observation of FLU VAX VIS: 04/29/10 version (09/04/2010 10:03) Added new observation of FLU VAXLOT: [id number] (09/04/2010 10:03) Added new observation of FLU VAXMFR: [employer] (09/04/2010 10:03) Added new observation of FLU VAX EXP: 04/04/2011 (09/04/2010 10:03) Added new observation of FLU VAX DSE: 0.64ml (09/04/2010 10:03) Added new observation of FLU VAX: Fluvax 3+ (09/04/2010 10:03)

## 2010-11-06 NOTE — Letter (Signed)
Summary: Appointment -missed  [facility] at [facility] [address]   [place], [place] [address]   Phone: [phone]  Fax: [phone]     October 20, 2010 MRN: [id number]     [facility] [address] [place], [place]  [address]     Dear Kenneth Patrick,  Our records indicate you missed your appointment on October 20, 2010                        with Dr.  Antoine Poche.   It is very important that we reach you to reschedule this appointment. We look forward to participating in your health care needs.   Please contact us at the number listed above at your earliest convenience to reschedule this appointment.   Sincerely,    Glass blower/designer

## 2010-11-18 ENCOUNTER — Telehealth (INDEPENDENT_AMBULATORY_CARE_PROVIDER_SITE_OTHER): Payer: Self-pay | Admitting: *Deleted

## 2010-11-25 ENCOUNTER — Ambulatory Visit (INDEPENDENT_AMBULATORY_CARE_PROVIDER_SITE_OTHER): Payer: 59 | Admitting: Pulmonary Disease

## 2010-11-25 ENCOUNTER — Encounter: Payer: Self-pay | Admitting: Pulmonary Disease

## 2010-11-25 DIAGNOSIS — J449 Chronic obstructive pulmonary disease, unspecified: Secondary | ICD-10-CM

## 2010-11-25 DIAGNOSIS — G4730 Sleep apnea, unspecified: Secondary | ICD-10-CM

## 2010-11-26 NOTE — Progress Notes (Signed)
Summary: refill  Phone Note Call from Patient Call back at Home Phone [phone]   Caller: Patient Call For: alva Reason for Call: Refill Medication, Talk to Nurse Summary of Call: Patient needing refill--symbicort--[facility] Initial call taken by: Lehman Prom,  November 18, 2010 9:23 AM  Follow-up for Phone Call        Pt informed that refill for Symbicort was sent to pharmacy. Abigail Miyamoto RN  November 18, 2010 11:01 AM     Prescriptions: SYMBICORT 160-4.5 MCG/ACT AERO (BUDESONIDE-FORMOTEROL FUMARATE) Inhale 2 puffs two times a day  #1 month x 3   Entered by:   Abigail Miyamoto RN   Authorized by:   Comer Locket. Vassie Loll MD   Signed by:   Abigail Miyamoto RN on 11/18/2010   Method used:   Electronically to        [facility] # [phone]* (retail)       [address]       [place], [place]  [address]       Ph: [phone] or [phone]       Fax: [phone]   RxID:   [id number]

## 2010-12-02 NOTE — Assessment & Plan Note (Signed)
Summary: pt due/per pt call/mhh   Visit Type:  Follow-up Copy to:  pcp Primary Provider/Referring Provider:  Dr. Doreen Beam  CC:  Pt here for follow up on COPD and OSA. No new complaints. States Symbicort is helping.  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  Initial OV Nov 4 '11 told 'asthma' many years ago, has used inhalers & diskus, Uses ventolin MDI 2-4 puffs 4-5 times/d Retains water, has  gained wt  - 12 lbs in last  , on CPAP x 14 years, compliant , helpful Not taking lisinopril  c/o wheezing on perfumes, cooking He smoked > 60 Pyrs before quitting in 2006. Echo in june '11 showed nml LV fn, small pericardial effusion, grossly nml valves. Stress test reportedly nml. A repeat echo has been scheduled by Dr Antoine Poche He had bronchitic symptoms in july treated with cipro. Spirometry showed severe airway obstruction with a low FEV1 ratio & FEV1 of 25 %, good effort !  November 25, 2010 9:41 AM  3 mnth FU  -  Symbicort working well - breathing better,not using rescue inhaler as much,  not enthusiasatic about pulm rehab program, Echo >>nml LV fn c/o nasal congestion, pedal edema - on lasix as needed  Gets CPAP supplies from online website , I don't even nap without CPAP   Current Medications (verified): 1)  Lantus 100 Unit/ml Soln (Insulin Glargine) .... 70 Units Two Times A Day 2)  Gabapentin 300 Mg Caps (Gabapentin) .... Take 1 Tablet By Mouth Two Times A Day 3)  Afrin Nasal Spray 0.05 % Soln (Oxymetazoline Hcl) .... One To Two Sprays Each Nostril Once To Two Times A Day 4)  Zoloft 100 Mg Tabs (Sertraline Hcl) .... Take 2 Tablet By Mouth Once A Day 5)  Ibuprofen 200 Mg Tabs (Ibuprofen) .... As Needed 6)  Ventolin Hfa 108 (90 Base) Mcg/act Aers (Albuterol Sulfate) .... As Needed 7)  Novolog Flexpen 100 Unit/ml Soln (Insulin Aspart) .... 20 Units Every Morning and 24 Units Every Evening 8)  Metformin Hcl 500  Mg Tabs (Metformin Hcl) .... Two Tabs Two Times A Day 9)  Potassium Gluconate 550 Mg Tabs (Potassium Gluconate) .... Take 2 Tab By Mouth At Bedtime 10)  Furosemide 40 Mg Tabs (Furosemide) .... Take 1 Tablet By Mouth Once A Day As Needed For Leg Swelling 11)  Klor-Con M10 10 Meq Cr-Tabs (Potassium Chloride Crys Cr) .... Take 1 Tablet By Mouth Once A Day When Taking Furosemide 12)  Cpap .Marland KitchenMarland Kitchen. 15 13)  Symbicort 160-4.5 Mcg/act Aero (Budesonide-Formoterol Fumarate) .... Inhale 2 Puffs Two Times A Day  Allergies (verified): 1)  ! 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  The patient denies anorexia, fever, weight loss, weight gain, vision loss, decreased hearing, hoarseness, chest pain, syncope, prolonged cough, headaches, hemoptysis, abdominal pain, melena, hematochezia, severe indigestion/heartburn, hematuria, muscle weakness, suspicious skin lesions, transient blindness, difficulty walking, depression, unusual weight change, abnormal bleeding, enlarged lymph nodes, and angioedema.    Vital Signs:  Patient profile:   59 year  old male Height:      76 inches Weight:      374.6 pounds BMI:     45.76 O2 Sat:      95 % on Room air Temp:     97.7 degrees F oral Pulse rate:   76 / minute BP sitting:   140 / 72  (left arm) Cuff size:   large  Vitals Entered By: Zackery Barefoot CMA (November 25, 2010 9:25 AM)  O2 Flow:  Room air CC: Pt here for follow up on COPD, OSA. No new complaints. States Symbicort is helping Comments Medications reviewed with patient Verified contact  number and pharmacy with patient Zackery Barefoot [facility]  November 25, 2010 9:25 AM    Physical Exam  Additional Exam:  wt 361 August 08, 2010  374 September 03, 2010 >> 374 November 25, 2010  Gen. Pleasant, well-nourished, in no distress, normal affect ENT - no lesions, no post nasal drip, class 2 airway Neck: No JVD, no thyromegaly, no carotid bruits Lungs: no use of accessory muscles, no dullness to percussion, decresaed BL without rales or rhonchi  Cardiovascular: Rhythm regular, heart sounds  normal, no murmurs or gallops, no peripheral edema Musculoskeletal: No deformities, no cyanosis or clubbing      Impression & Recommendations:  Problem # 1:  C O P D (ICD-496) ct symbicort OK hold off spiriva for now  Tried to convince him to start pulm rehab program - he remians resistant to this discussed S & S of flare  Problem # 2:  UNSPECIFIED SLEEP APNEA (ICD-780.57)  Obstructive, on CPAP-  he prefers internet suplies rather than DME Compliance encouraged, wt loss emphasized, asked to avoid meds with sedative side effects, cautioned against driving when sleepy.   Orders: Est. Patient Level III (91478)  Patient Instructions: 1)  Copy sent to: Dr Sherril Croon 2)  Please schedule a follow-up appointment in 4 months with TP

## 2011-03-21 ENCOUNTER — Encounter: Payer: Self-pay | Admitting: Adult Health

## 2011-03-26 ENCOUNTER — Ambulatory Visit: Payer: 59 | Admitting: Adult Health

## 2011-05-14 ENCOUNTER — Ambulatory Visit (INDEPENDENT_AMBULATORY_CARE_PROVIDER_SITE_OTHER): Payer: 59 | Admitting: Adult Health

## 2011-05-14 ENCOUNTER — Encounter: Payer: Self-pay | Admitting: Adult Health

## 2011-05-14 DIAGNOSIS — J449 Chronic obstructive pulmonary disease, unspecified: Secondary | ICD-10-CM

## 2011-05-14 DIAGNOSIS — G4730 Sleep apnea, unspecified: Secondary | ICD-10-CM

## 2011-05-14 MED ORDER — BUDESONIDE-FORMOTEROL FUMARATE 160-4.5 MCG/ACT IN AERO: 2.0000 | INHALATION_SPRAY | Freq: Two times a day (BID) | RESPIRATORY_TRACT | Status: DC

## 2011-05-14 NOTE — Progress Notes (Signed)
Addended by: Modesto Charon on: 05/14/2011 03:25 PM   Modules accepted: Orders

## 2011-05-14 NOTE — Assessment & Plan Note (Signed)
Compensated on present regimen.  Pneumovax today  Encouraged act/exercise -he declines pulm. rehab

## 2011-05-14 NOTE — Assessment & Plan Note (Signed)
Doing well with CPAP  Encouraged on weight loss

## 2011-05-14 NOTE — Patient Instructions (Signed)
Continue on Symbicort 160/4.35mcg 2 puffs Twice daily  -brush/rinse and gargle after use.  Activity /exercise as tolerated.  CPAP at night  Weight loss.  follow up Dr. Vassie Loll  In 4 months  Pneumovax today

## 2011-05-14 NOTE — Progress Notes (Signed)
Addended by: Modesto Charon on: 05/14/2011 03:20 PM   Modules accepted: Orders

## 2011-05-14 NOTE — Progress Notes (Signed)
Subjective:    Patient ID: Kenneth Patrick, male    DOB: [birth year], 60 y.o.   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   Initial OV Nov 4 '11  told 'asthma' many years ago, has used inhalers & diskus, Uses ventolin MDI 2-4 puffs 4-5 times/d  Retains water, has gained wt - 12 lbs in last , on CPAP x 14 years, compliant , helpful  Not taking lisinopril  c/o wheezing on perfumes, cooking  He smoked > 60 Pyrs before quitting in 2006.  Echo in june '11 showed nml LV fn, small pericardial effusion, grossly nml valves. Stress test reportedly nml. A repeat echo has been scheduled by Dr Antoine Poche  He had bronchitic symptoms in july treated with cipro.  Spirometry showed severe airway obstruction with a low FEV1 ratio & FEV1 of 25 %, good effort !   November 25, 2010 9:41  3 mnth FU -  Symbicort working well - breathing better,not using rescue inhaler as much, not enthusiasatic about pulm rehab program, Echo >>nml LV fn  c/o nasal congestion, pedal edema - on lasix as needed  Gets CPAP supplies from online website , I don't even nap without CPAP  4 month follow up COPD Pt returns for follow up. Reports some good days and bad.  pt attributes some SOB, wheezing, hacking cough with yellow mucus in the mornings to the weather. Cough is minimal and comes and goes. No chest pain or fever. Scant mucus-minimally discolored.  Feels okay. Declines pulmonary rehab. Has never had pneumovax.   Wears CPAP everynight. ON average 8hr each night.   Review of Systems Constitutional:   No  weight loss, night sweats,  Fevers, chills, fatigue, or  lassitude.  HEENT:   No headaches,  Difficulty swallowing,  Tooth/dental problems, or  Sore throat,                No sneezing, itching, ear ache, nasal congestion, post nasal drip,   CV:  No chest pain,  Orthopnea, PND, swelling in lower extremities, anasarca, dizziness, palpitations, syncope.   GI  No  heartburn, indigestion, abdominal pain, nausea, vomiting, diarrhea, change in bowel habits, loss of appetite, bloody stools.   Resp: No shortness of breath with exertion or at rest.  No excess mucus, no productive cough,  No non-productive cough,  No coughing up of blood.  No change in color of mucus.  No wheezing.  No chest wall deformity  Skin: no rash or lesions.  GU: no dysuria, change in color of urine, no urgency or frequency.  No flank pain, no hematuria   MS:  No joint pain or swelling.  No decreased range of motion.  No back pain.  Psych:  No change in mood or affect. No depression or anxiety.  No memory loss.         Objective:   Physical Exam  GEN: A/Ox3; pleasant , NAD,  Obese   HEENT:  [place]/AT,  EACs-clear, TMs-wnl, NOSE-clear, THROAT-clear, no lesions, no postnasal drip or exudate noted.   NECK:  Supple w/ fair ROM; no JVD; normal carotid impulses w/o bruits; no thyromegaly or nodules palpated; no lymphadenopathy.  RESP  Clear  P & A; w/o, wheezes/ rales/ or rhonchi.no accessory muscle use, no dullness to percussion  CARD:  RRR, no m/r/g  ,tr peripheral edema, pulses intact, no cyanosis or clubbing.  GI:   Soft & nt; nml bowel sounds;  no organomegaly or masses detected.  Musco: Warm bil, no deformities or joint swelling noted.   Neuro: alert, no focal deficits noted.    Skin: Warm, no lesions or rashes         Assessment & Plan:

## 2011-10-01 ENCOUNTER — Ambulatory Visit: Payer: 59 | Admitting: Pulmonary Disease

## 2011-10-19 ENCOUNTER — Ambulatory Visit: Payer: 59 | Admitting: Pulmonary Disease

## 2011-10-23 ENCOUNTER — Ambulatory Visit (INDEPENDENT_AMBULATORY_CARE_PROVIDER_SITE_OTHER): Payer: 59 | Admitting: Pulmonary Disease

## 2011-10-23 ENCOUNTER — Encounter: Payer: Self-pay | Admitting: Pulmonary Disease

## 2011-10-23 VITALS — BP 140/84 | HR 92 | Temp 98.2°F | Ht 76.0 in | Wt 377.2 lb

## 2011-10-23 DIAGNOSIS — J449 Chronic obstructive pulmonary disease, unspecified: Secondary | ICD-10-CM

## 2011-10-23 DIAGNOSIS — Z23 Encounter for immunization: Secondary | ICD-10-CM

## 2011-10-23 NOTE — Patient Instructions (Signed)
Flu shot  Refills for symbicort will be sent Trial of spiriva  - call us back for Rx  if this works Call us if you change your mind about pulmonary rehab

## 2011-10-23 NOTE — Progress Notes (Signed)
  Subjective:    Patient ID: Kenneth Patrick, male    DOB: Feb 16, 1951, 61 y.o.   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  Initial OV Nov 4 '11  told 'asthma' many years ago, has used inhalers & diskus, Uses ventolin MDI 2-4 puffs 4-5 times/d  -on CPAP x 14 years, compliant , helpful  c/o wheezing on perfumes, cooking  He smoked > 60 Pyrs before quitting in 2006.  Echo in june '11 showed nml LV fn, small pericardial effusion, grossly nml valves. Stress test reportedly nml. Spirometry showed severe airway obstruction with a low FEV1 ratio & FEV1 of 25 %, good effort !    10/23/2011 Admits to gaining wt, breathing slight owrse Embarrassed about pulm rehab  Wears CPAP everynight. ON average 8hr each night. Gets CPAP supplies from online website   Review of Systems Patient denies significant dyspnea,cough, hemoptysis,  chest pain, palpitations, pedal edema, orthopnea, paroxysmal nocturnal dyspnea, lightheadedness, nausea, vomiting, abdominal or  leg pains      Objective:   Physical Exam  Gen. Pleasant, obese, in no distress ENT - no lesions, no post nasal drip, class 3 airway Neck: No JVD, no thyromegaly, no carotid bruits Lungs: no use of accessory muscles, no dullness to percussion, decreased without rales or rhonchi  Cardiovascular: Rhythm regular, heart sounds  normal, no murmurs or gallops, no peripheral edema Musculoskeletal: No deformities, no cyanosis or clubbing , no tremors       Assessment & Plan:

## 2011-10-23 NOTE — Assessment & Plan Note (Addendum)
Flu shot  Refills for symbicort will be sent Trial of spiriva  - call us back for Rx  if this works Advised  pulmonary rehab but he is not agreeable

## 2011-10-28 ENCOUNTER — Telehealth: Payer: Self-pay | Admitting: Pulmonary Disease

## 2011-10-28 MED ORDER — TIOTROPIUM BROMIDE MONOHYDRATE 18 MCG IN CAPS: 18.0000 ug | ORAL_CAPSULE | Freq: Every day | RESPIRATORY_TRACT | Status: DC

## 2011-10-28 NOTE — Telephone Encounter (Signed)
Per RA last note on 10/23/11-call for spiriva rx if it helps. Pt states the spiriva has helped a lot with his breathing and would like rx sent. I made pt aware will send rx and will forward to RA as an FYI.

## 2011-10-29 NOTE — Telephone Encounter (Signed)
ok 

## 2012-01-07 ENCOUNTER — Other Ambulatory Visit: Payer: Self-pay | Admitting: Adult Health

## 2012-01-08 ENCOUNTER — Telehealth: Payer: Self-pay | Admitting: Pulmonary Disease

## 2012-01-08 MED ORDER — BUDESONIDE-FORMOTEROL FUMARATE 160-4.5 MCG/ACT IN AERO: 2.0000 | INHALATION_SPRAY | Freq: Two times a day (BID) | RESPIRATORY_TRACT | Status: DC

## 2012-01-08 NOTE — Telephone Encounter (Signed)
I spoke with pt and he stated he needed his symbicort rx [facility] [place]. I advised will send rx and nothing further was needed

## 2012-01-12 ENCOUNTER — Other Ambulatory Visit: Payer: Self-pay | Admitting: Adult Health

## 2012-04-22 ENCOUNTER — Ambulatory Visit: Payer: 59 | Admitting: Adult Health

## 2012-05-03 ENCOUNTER — Ambulatory Visit: Payer: 59 | Admitting: Adult Health

## 2012-05-04 ENCOUNTER — Telehealth: Payer: Self-pay | Admitting: Internal Medicine

## 2012-05-04 ENCOUNTER — Encounter: Payer: Self-pay | Admitting: Adult Health

## 2012-05-04 ENCOUNTER — Ambulatory Visit (INDEPENDENT_AMBULATORY_CARE_PROVIDER_SITE_OTHER): Payer: 59 | Admitting: Adult Health

## 2012-05-04 VITALS — BP 132/74 | HR 88 | Temp 98.9°F | Ht 76.0 in | Wt 373.8 lb

## 2012-05-04 DIAGNOSIS — G4730 Sleep apnea, unspecified: Secondary | ICD-10-CM

## 2012-05-04 DIAGNOSIS — J449 Chronic obstructive pulmonary disease, unspecified: Secondary | ICD-10-CM

## 2012-05-04 NOTE — Patient Instructions (Addendum)
Continue on Symbicort 160/4.44mcg 2 puffs Twice daily  -brush/rinse and gargle after use.  Activity /exercise as tolerated.  CPAP at night  Weight loss.  follow up Dr. Vassie Loll  In 6  months

## 2012-05-04 NOTE — Telephone Encounter (Signed)
Error. Wrong pt. I will close this encounter. (Crystal in triage is aware). Kenneth Patrick

## 2012-05-04 NOTE — Progress Notes (Signed)
  Subjective:    Patient ID: Kenneth Patrick, male    DOB: 11/21/1950, 60 y.o.   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  Initial OV Nov 4 '11  told 'asthma' many years ago, has used inhalers & diskus, Uses ventolin MDI 2-4 puffs 4-5 times/d  -on CPAP x 14 years, compliant , helpful  c/o wheezing on perfumes, cooking  He smoked > 60 Pyrs before quitting in 2006.  Echo in june '11 showed nml LV fn, small pericardial effusion, grossly nml valves. Stress test reportedly nml. Spirometry showed severe airway obstruction with a low FEV1 ratio & FEV1 of 25 %, good effort !   10/23/11  Admits to gaining wt, breathing slight owrse Embarrassed about pulm rehab  Wears CPAP everynight. ON average 8hr each night. Gets CPAP supplies from online website  05/04/2012 Follow up  Did not tolerate Spiriva -thought it caused leg swelling.  Overall doing ok , without flare in cough or wheezing  Wears CPAP w/out missed time, wears with naps.  No chest pain or hemoptysis     Review of Systems Constitutional:   No  weight loss, night sweats,  Fevers, chills,  +fatigue, or  lassitude.  HEENT:   No headaches,  Difficulty swallowing,  Tooth/dental problems, or  Sore throat,                No sneezing, itching, ear ache, nasal congestion, post nasal drip,   CV:  No chest pain,  Orthopnea, PND, swelling in lower extremities, anasarca, dizziness, palpitations, syncope.   GI  No heartburn, indigestion, abdominal pain, nausea, vomiting, diarrhea, change in bowel habits, loss of appetite, bloody stools.   Resp: .  No excess mucus, no productive cough,  No non-productive cough,  No coughing up of blood.  No change in color of mucus.  No wheezing.  No chest wall deformity  Skin: no rash or lesions.  GU: no dysuria, change in color of urine, no urgency or frequency.  No flank pain, no hematuria   MS:  No joint pain or swelling.      Psych:   No change in mood or affect. .  No memory loss.          Objective:   Physical Exam  GEN: A/Ox3; pleasant , NAD, obese   HEENT:  [place]/AT,  EACs-clear, TMs-wnl, NOSE-clear, THROAT-clear, no lesions, no postnasal drip or exudate noted.   NECK:  Supple w/ fair ROM; no JVD; normal carotid impulses w/o bruits; no thyromegaly or nodules palpated; no lymphadenopathy.  RESP  Clear  P & A; w/o, wheezes/ rales/ or rhonchi.no accessory muscle use, no dullness to percussion  CARD:  RRR, no m/r/g  , tr  peripheral edema, pulses intact, no cyanosis or clubbing.  GI:   Soft & nt; nml bowel sounds; no organomegaly or masses detected.  Musco: Warm bil, no deformities or joint swelling noted.   Neuro: alert, no focal deficits noted.    Skin: Warm, no lesions or rashes        Assessment & Plan:

## 2012-05-05 NOTE — Assessment & Plan Note (Signed)
Compensated on present regimen Plan  Activity /exercise as tolerated.  CPAP at night  Weight loss.  follow up Dr. Vassie Loll  In 6  months

## 2012-05-05 NOTE — Assessment & Plan Note (Signed)
Compensated on present regimen   Plan  Continue on Symbicort 160/4.53mcg 2 puffs Twice daily  -brush/rinse and gargle after use.  Activity /exercise as tolerated.   follow up Dr. Vassie Loll  In 6  months

## 2012-07-26 ENCOUNTER — Telehealth: Payer: Self-pay | Admitting: Pulmonary Disease

## 2012-07-26 MED ORDER — BUDESONIDE-FORMOTEROL FUMARATE 160-4.5 MCG/ACT IN AERO: 2.0000 | INHALATION_SPRAY | Freq: Two times a day (BID) | RESPIRATORY_TRACT | Status: DC

## 2012-07-26 NOTE — Telephone Encounter (Signed)
lmomtcb x1 for pt--RX has been sent to the pharmacy

## 2012-07-26 NOTE — Telephone Encounter (Signed)
Patient aware rx has been sent to the pharmacy.  [facility]

## 2012-11-07 ENCOUNTER — Ambulatory Visit: Payer: 59 | Admitting: Pulmonary Disease

## 2012-11-29 ENCOUNTER — Telehealth: Payer: Self-pay | Admitting: Pulmonary Disease

## 2012-11-29 MED ORDER — MOMETASONE FURO-FORMOTEROL FUM 100-5 MCG/ACT IN AERO: 1.0000 | INHALATION_SPRAY | Freq: Two times a day (BID) | RESPIRATORY_TRACT | Status: DC

## 2012-11-29 NOTE — Telephone Encounter (Signed)
I spoke with pt and is aware of RX change strength and directions. RX has been sent. Nothing further was needed

## 2012-11-29 NOTE — Telephone Encounter (Signed)
dulera 100 1 puff bid

## 2012-11-29 NOTE — Telephone Encounter (Signed)
Mindy, have you seen fax from [facility] with alternatives for symbicort? Please advise, thanks!

## 2012-11-29 NOTE — Telephone Encounter (Signed)
Pl chk what alternatives are on formulary - advair vs dulera?

## 2012-11-29 NOTE — Telephone Encounter (Signed)
Please advise Dr. Alva thanks 

## 2012-11-29 NOTE — Telephone Encounter (Signed)
Alvesco,asmanex or QVAR is option 1 with lower savings  Option 2 is advair or dulera  Please advise thanks

## 2012-12-06 ENCOUNTER — Encounter: Payer: Self-pay | Admitting: Pulmonary Disease

## 2012-12-06 ENCOUNTER — Ambulatory Visit (INDEPENDENT_AMBULATORY_CARE_PROVIDER_SITE_OTHER): Payer: 59 | Admitting: Pulmonary Disease

## 2012-12-06 VITALS — BP 150/74 | HR 99 | Temp 97.0°F | Ht 76.0 in | Wt 376.0 lb

## 2012-12-06 DIAGNOSIS — J449 Chronic obstructive pulmonary disease, unspecified: Secondary | ICD-10-CM

## 2012-12-06 DIAGNOSIS — G4733 Obstructive sleep apnea (adult) (pediatric): Secondary | ICD-10-CM

## 2012-12-06 MED ORDER — TIOTROPIUM BROMIDE MONOHYDRATE 18 MCG IN CAPS: 18.0000 ug | ORAL_CAPSULE | Freq: Every day | RESPIRATORY_TRACT | Status: DC

## 2012-12-06 MED ORDER — BUDESONIDE-FORMOTEROL FUMARATE 160-4.5 MCG/ACT IN AERO: 2.0000 | INHALATION_SPRAY | Freq: Two times a day (BID) | RESPIRATORY_TRACT | Status: DC

## 2012-12-06 NOTE — Assessment & Plan Note (Addendum)
Trial of spiriva again - call us for Rx if this works Rx for [employer] for mail order x 3 mnth supply Otherwise , consider using dulera with a spacer or stop

## 2012-12-06 NOTE — Progress Notes (Signed)
  Subjective:    Patient ID: Kenneth Patrick, male    DOB: 1951-05-24, 62 y.o.   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  Initial OV Nov 4 '11  told 'asthma' many years ago, has used inhalers & diskus, Uses ventolin MDI 2-4 puffs 4-5 times/d  -on CPAP x 14 years, compliant , helpful  c/o wheezing on perfumes, cooking  He smoked > 60 Pyrs before quitting in 2006.  Echo in june '11 showed nml LV fn, small pericardial effusion, grossly nml valves. Stress test reportedly nml.  Spirometry showed severe airway obstruction with a low FEV1 ratio & FEV1 of 25 %, good effort !    12/06/2012 59m FU Formulary changed - had to go on dulera instead of symbicort, c/o palpitations & tremors after taking 1 puff of dulera Did not tolerate Spiriva -thought it caused leg swelling.  Overall doing ok , without flare in cough or wheezing  Wears CPAP w/out missed time, wears with naps.  No chest pain or hemoptysis breathing is slight worse, cough w/ occasional phlem, wheezing and chest tx. only used dulera twice-caused high HR, shakes Pt wears CPAP everynight x 6-8 hrs a night. denies any problems w/ mask/machine. feels rested during the day for the most part Gets CPAP supplies from online website   Review of Systems neg for any significant sore throat, dysphagia, itching, sneezing, nasal congestion or excess/ purulent secretions, fever, chills, sweats, unintended wt loss, pleuritic or exertional cp, hempoptysis, orthopnea pnd or change in chronic leg swelling. Also denies presyncope, palpitations, heartburn, abdominal pain, nausea, vomiting, diarrhea or change in bowel or urinary habits, dysuria,hematuria, rash, arthralgias, visual complaints, headache, numbness weakness or ataxia.     Objective:   Physical Exam   Gen. Pleasant, obese, in no distress ENT - no lesions, no post nasal drip Neck: No JVD, no thyromegaly, no carotid  bruits Lungs: no use of accessory muscles, no dullness to percussion, decreased without rales or rhonchi  Cardiovascular: Rhythm regular, heart sounds  normal, no murmurs or gallops, no peripheral edema Musculoskeletal: No deformities, no cyanosis or clubbing , no tremors        Assessment & Plan:

## 2012-12-06 NOTE — Assessment & Plan Note (Signed)
Weight loss encouraged, compliance with goal of at least 4-6 hrs every night is the expectation. Advised against medications with sedative side effects Cautioned against driving when sleepy - understanding that sleepiness will vary on a day to day basis  

## 2012-12-06 NOTE — Patient Instructions (Addendum)
Trial of spiriva again - call us for Rx if this works Rx for [employer] for mail order x 3 mnth supply

## 2013-06-08 ENCOUNTER — Encounter: Payer: Self-pay | Admitting: Adult Health

## 2013-06-08 ENCOUNTER — Ambulatory Visit: Payer: 59 | Admitting: Adult Health

## 2013-06-08 ENCOUNTER — Ambulatory Visit (INDEPENDENT_AMBULATORY_CARE_PROVIDER_SITE_OTHER): Payer: 59 | Admitting: Adult Health

## 2013-06-08 VITALS — BP 120/62 | HR 90 | Temp 97.0°F | Ht 76.0 in | Wt 371.6 lb

## 2013-06-08 DIAGNOSIS — Z23 Encounter for immunization: Secondary | ICD-10-CM

## 2013-06-08 DIAGNOSIS — J4489 Other specified chronic obstructive pulmonary disease: Secondary | ICD-10-CM

## 2013-06-08 DIAGNOSIS — J449 Chronic obstructive pulmonary disease, unspecified: Secondary | ICD-10-CM

## 2013-06-08 DIAGNOSIS — G4733 Obstructive sleep apnea (adult) (pediatric): Secondary | ICD-10-CM

## 2013-06-08 MED ORDER — FLUTICASONE FUROATE-VILANTEROL 100-25 MCG/INH IN AEPB: 1.0000 | INHALATION_SPRAY | Freq: Every day | RESPIRATORY_TRACT | Status: DC

## 2013-06-08 MED ORDER — FLUTICASONE FUROATE-VILANTEROL 100-25 MCG/INH IN AEPB: 1.0000 | INHALATION_SPRAY | Freq: Every day | RESPIRATORY_TRACT | Status: AC

## 2013-06-08 NOTE — Assessment & Plan Note (Signed)
Compensated on nocturnal CPAP  Plan  patient encouraged on weight loss. Continue on CPAP at bedtime and naps

## 2013-06-08 NOTE — Patient Instructions (Addendum)
Finish Symbicort.  Begin Breo 1 puff daily (in place of Symbicort) .   Activity /exercise as tolerated.  CPAP at night  Weight loss.  follow up Dr. Vassie Loll  In 6  months  Flu shot.

## 2013-06-08 NOTE — Assessment & Plan Note (Addendum)
Doing well on [employer] is requiring him change to an alternative  Plan  Flu shot today  [employer] .  Begin Breo 1 puff daily (in place of Symbicort) .  follow up Dr. Vassie Loll  In 6  months

## 2013-06-08 NOTE — Progress Notes (Signed)
  Subjective:    Patient ID: Kenneth Patrick, male    DOB: [birth year], 62 y.o.   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  Initial OV Nov 4 '11  told 'asthma' many years ago, has used inhalers & diskus, Uses ventolin MDI 2-4 puffs 4-5 times/d  -on CPAP x 14 years, compliant , helpful  c/o wheezing on perfumes, cooking  He smoked > 60 Pyrs before quitting in 2006.  Echo in june '11 showed nml LV fn, small pericardial effusion, grossly nml valves. Stress test reportedly nml.  Spirometry showed severe airway obstruction with a low FEV1 ratio & FEV1 of 25 %, good effort !    12/06/2012 15m FU Formulary changed - had to go on dulera instead of symbicort, c/o palpitations & tremors after taking 1 puff of dulera Did not tolerate Spiriva -thought it caused leg swelling.  Overall doing ok , without flare in cough or wheezing  Wears CPAP w/out missed time, wears with naps.  No chest pain or hemoptysis breathing is slight worse, cough w/ occasional phlem, wheezing and chest tx. only used dulera twice-caused high HR, shakes Pt wears CPAP everynight x 6-8 hrs a night. denies any problems w/ mask/machine. feels rested during the day for the most part Gets CPAP supplies from online website  >>spiriva rx   06/08/2013 Follow up COPD/OSA  Remains on Symbicort but insurance requires alternative as they will no longer cover Symbicort.   Retried Spiriva last ov ,only took sample . Does not fill he needs it.   Is on ACE inhibitor , denies cough.  Wears CPAP everynight for 6-10 hrs on avg.  Wears with naps. Feels rested most days.  Denies chest pain, orthopnea, edema or hemoptysis.    Review of Systems  neg for any significant sore throat, dysphagia, itching, sneezing, nasal congestion or excess/ purulent secretions, fever, chills, sweats, unintended wt loss, pleuritic or exertional cp, hempoptysis, orthopnea pnd or change in chronic leg  swelling. Also denies presyncope, palpitations, heartburn, abdominal pain, nausea, vomiting, diarrhea or change in bowel or urinary habits, dysuria,hematuria, rash, arthralgias, visual complaints, headache, numbness weakness or ataxia.     Objective:   Physical Exam    Gen. Pleasant, obese, in no distress ENT - no lesions, no post nasal drip Neck: No JVD, no thyromegaly, no carotid bruits Lungs: no use of accessory muscles, no dullness to percussion, decreased without rales or rhonchi  Cardiovascular: Rhythm regular, heart sounds  normal, no murmurs or gallops, no peripheral edema Musculoskeletal: No deformities, no cyanosis or clubbing , no tremors        Assessment & Plan:

## 2013-06-08 NOTE — Addendum Note (Signed)
Addended by: Boone Master E on: 06/08/2013 10:42 AM   Modules accepted: Orders

## 2013-11-24 ENCOUNTER — Inpatient Hospital Stay (HOSPITAL_COMMUNITY)
Admission: AD | Admit: 2013-11-24 | Discharge: 2013-11-30 | DRG: 871 | Disposition: A | Discharge status: Home / Self Care | Payer: 59 | Source: Other Acute Inpatient Hospital | Attending: Pulmonary Disease | Admitting: Pulmonary Disease

## 2013-11-24 DIAGNOSIS — J9600 Acute respiratory failure, unspecified whether with hypoxia or hypercapnia: Secondary | ICD-10-CM | POA: Diagnosis present

## 2013-11-24 DIAGNOSIS — F3289 Other specified depressive episodes: Secondary | ICD-10-CM | POA: Diagnosis present

## 2013-11-24 DIAGNOSIS — Z880 Allergy status to penicillin: Secondary | ICD-10-CM

## 2013-11-24 DIAGNOSIS — J189 Pneumonia, unspecified organism: Secondary | ICD-10-CM

## 2013-11-24 DIAGNOSIS — IMO0002 Reserved for concepts with insufficient information to code with codable children: Secondary | ICD-10-CM | POA: Diagnosis present

## 2013-11-24 DIAGNOSIS — J218 Acute bronchiolitis due to other specified organisms: Secondary | ICD-10-CM | POA: Diagnosis present

## 2013-11-24 DIAGNOSIS — E872 Acidosis, unspecified: Secondary | ICD-10-CM

## 2013-11-24 DIAGNOSIS — E1165 Type 2 diabetes mellitus with hyperglycemia: Secondary | ICD-10-CM | POA: Diagnosis present

## 2013-11-24 DIAGNOSIS — Z418 Encounter for other procedures for purposes other than remedying health state: Secondary | ICD-10-CM

## 2013-11-24 DIAGNOSIS — E46 Unspecified protein-calorie malnutrition: Secondary | ICD-10-CM | POA: Diagnosis present

## 2013-11-24 DIAGNOSIS — I10 Essential (primary) hypertension: Secondary | ICD-10-CM | POA: Diagnosis present

## 2013-11-24 DIAGNOSIS — N179 Acute kidney failure, unspecified: Secondary | ICD-10-CM

## 2013-11-24 DIAGNOSIS — I959 Hypotension, unspecified: Secondary | ICD-10-CM

## 2013-11-24 DIAGNOSIS — I2584 Coronary atherosclerosis due to calcified coronary lesion: Secondary | ICD-10-CM | POA: Diagnosis present

## 2013-11-24 DIAGNOSIS — E118 Type 2 diabetes mellitus with unspecified complications: Secondary | ICD-10-CM

## 2013-11-24 DIAGNOSIS — E119 Type 2 diabetes mellitus without complications: Secondary | ICD-10-CM

## 2013-11-24 DIAGNOSIS — E669 Obesity, unspecified: Secondary | ICD-10-CM

## 2013-11-24 DIAGNOSIS — E1149 Type 2 diabetes mellitus with other diabetic neurological complication: Secondary | ICD-10-CM | POA: Diagnosis present

## 2013-11-24 DIAGNOSIS — Z6839 Body mass index (BMI) 39.0-39.9, adult: Secondary | ICD-10-CM

## 2013-11-24 DIAGNOSIS — R918 Other nonspecific abnormal finding of lung field: Secondary | ICD-10-CM

## 2013-11-24 DIAGNOSIS — J9690 Respiratory failure, unspecified, unspecified whether with hypoxia or hypercapnia: Secondary | ICD-10-CM | POA: Diagnosis present

## 2013-11-24 DIAGNOSIS — B338 Other specified viral diseases: Secondary | ICD-10-CM | POA: Diagnosis present

## 2013-11-24 DIAGNOSIS — J4489 Other specified chronic obstructive pulmonary disease: Secondary | ICD-10-CM

## 2013-11-24 DIAGNOSIS — J449 Chronic obstructive pulmonary disease, unspecified: Secondary | ICD-10-CM

## 2013-11-24 DIAGNOSIS — Z2989 Encounter for other specified prophylactic measures: Secondary | ICD-10-CM

## 2013-11-24 DIAGNOSIS — N170 Acute kidney failure with tubular necrosis: Secondary | ICD-10-CM | POA: Diagnosis present

## 2013-11-24 DIAGNOSIS — J9601 Acute respiratory failure with hypoxia: Secondary | ICD-10-CM

## 2013-11-24 DIAGNOSIS — B974 Respiratory syncytial virus as the cause of diseases classified elsewhere: Secondary | ICD-10-CM | POA: Diagnosis present

## 2013-11-24 DIAGNOSIS — G4733 Obstructive sleep apnea (adult) (pediatric): Secondary | ICD-10-CM

## 2013-11-24 DIAGNOSIS — E861 Hypovolemia: Secondary | ICD-10-CM | POA: Diagnosis present

## 2013-11-24 DIAGNOSIS — E876 Hypokalemia: Secondary | ICD-10-CM | POA: Diagnosis not present

## 2013-11-24 DIAGNOSIS — J159 Unspecified bacterial pneumonia: Secondary | ICD-10-CM | POA: Diagnosis present

## 2013-11-24 DIAGNOSIS — Z6841 Body Mass Index (BMI) 40.0 and over, adult: Secondary | ICD-10-CM

## 2013-11-24 DIAGNOSIS — E1142 Type 2 diabetes mellitus with diabetic polyneuropathy: Secondary | ICD-10-CM | POA: Diagnosis present

## 2013-11-24 DIAGNOSIS — R6520 Severe sepsis without septic shock: Secondary | ICD-10-CM

## 2013-11-24 DIAGNOSIS — E7800 Pure hypercholesterolemia, unspecified: Secondary | ICD-10-CM | POA: Diagnosis present

## 2013-11-24 DIAGNOSIS — Z806 Family history of leukemia: Secondary | ICD-10-CM

## 2013-11-24 DIAGNOSIS — I2510 Atherosclerotic heart disease of native coronary artery without angina pectoris: Secondary | ICD-10-CM | POA: Diagnosis present

## 2013-11-24 DIAGNOSIS — A419 Sepsis, unspecified organism: Secondary | ICD-10-CM | POA: Diagnosis present

## 2013-11-24 DIAGNOSIS — F329 Major depressive disorder, single episode, unspecified: Secondary | ICD-10-CM | POA: Diagnosis present

## 2013-11-24 DIAGNOSIS — E875 Hyperkalemia: Secondary | ICD-10-CM | POA: Diagnosis present

## 2013-11-24 DIAGNOSIS — Z713 Dietary counseling and surveillance: Secondary | ICD-10-CM

## 2013-11-24 DIAGNOSIS — Z833 Family history of diabetes mellitus: Secondary | ICD-10-CM

## 2013-11-24 DIAGNOSIS — N049 Nephrotic syndrome with unspecified morphologic changes: Secondary | ICD-10-CM | POA: Diagnosis present

## 2013-11-24 DIAGNOSIS — Z87891 Personal history of nicotine dependence: Secondary | ICD-10-CM

## 2013-11-24 DIAGNOSIS — Z794 Long term (current) use of insulin: Secondary | ICD-10-CM

## 2013-11-24 DIAGNOSIS — D649 Anemia, unspecified: Secondary | ICD-10-CM | POA: Diagnosis present

## 2013-11-24 LAB — GLUCOSE, CAPILLARY: Glucose-Capillary: 256 mg/dL — ABNORMAL HIGH (ref 70–99)

## 2013-11-24 MED ORDER — GABAPENTIN 300 MG PO CAPS
300.0000 mg | ORAL_CAPSULE | Freq: Two times a day (BID) | ORAL | Status: DC
  Administered 2013-11-25 – 2013-11-30 (×11): 300 mg via ORAL
  Filled 2013-11-24 (×12): qty 1

## 2013-11-24 MED ORDER — FLUTICASONE FUROATE-VILANTEROL 100-25 MCG/INH IN AEPB: 1.0000 | INHALATION_SPRAY | Freq: Every day | RESPIRATORY_TRACT | Status: DC

## 2013-11-24 MED ORDER — SERTRALINE HCL 100 MG PO TABS
200.0000 mg | ORAL_TABLET | Freq: Every day | ORAL | Status: DC
  Administered 2013-11-25 – 2013-11-30 (×6): 200 mg via ORAL
  Filled 2013-11-24 (×6): qty 2

## 2013-11-24 NOTE — H&P (Signed)
PULMONARY  / CRITICAL CARE MEDICINE HISTORY AND PHYSICAL EXAMINATION  Name: Kenneth Patrick MRN: [id number] DOB: Feb 28, 1951    ADMISSION DATE:  11/24/2013  CHIEF COMPLAINT:  Shortness of breath  BRIEF PATIENT DESCRIPTION: 63 M with COPD who presented to OSH with hypoxic respiratory failure and metabolic acidosis in the setting of AKI.  SIGNIFICANT EVENTS / STUDIES:  1. ABG at [facility] 7.14/32/54/10.9 on 2L --> 7.12/35/106/12.5 on BiPAP 2. BNP 684 at [facility] 3. BUN72/Cr 5.03  LINES / TUBES: 1. PIV  CULTURES: 1. None  ANTIBIOTICS: 1. None  HISTORY OF PRESENT ILLNESS:  Kenneth Patrick is a 63 yo M with COPD, severe DM, and OSA who presented to [facility] this evening after visiting his PCP and developing near-syncope. He had been sick with a 3 week history of increased SOB, cough productive of yellowish/greenish sputum, and chills. He denies fevers or hemoptysis.   PAST MEDICAL HISTORY :  Past Medical History  Diagnosis Date  . Diabetic neuropathy   . Respiratory infection   . Low back pain   . Asthma   . Dyspnea   . Sleep apnea   . Depression   . DM2 (diabetes mellitus, type 2)   . Hypercholesterolemia     Past Surgical History  Procedure Laterality Date  . Cholecystectomy    . Left eye enucleation following trauma      Prior to Admission medications   Medication Sig Start Date End Date Taking? Authorizing Provider  albuterol (VENTOLIN HFA) 108 (90 BASE) MCG/ACT inhaler Inhale 2 puffs into the lungs every 6 (six) hours as needed.      Historical Provider, MD  atorvastatin (LIPITOR) 80 MG tablet Take 1 tablet by mouth At bedtime. 05/01/11   Historical Provider, MD  Fluticasone Furoate-Vilanterol (BREO ELLIPTA) 100-25 MCG/INH AEPB Inhale 1 puff into the lungs daily. 06/08/13   Tammy S Parrett, NP  furosemide (LASIX) 40 MG tablet Take 40 mg by mouth daily as needed.      Historical Provider, MD  gabapentin (NEURONTIN) 300 MG capsule Take 300 mg by  mouth 2 (two) times daily.      Historical Provider, MD  ibuprofen (ADVIL,MOTRIN) 200 MG tablet as needed.      Historical Provider, MD  insulin aspart (NOVOLOG FLEXPEN) 100 UNIT/ML injection 20 units every morning and 24 units every evening     Historical Provider, MD  insulin glargine (LANTUS) 100 UNIT/ML injection Inject 70 Units into the skin 2 (two) times daily.      Historical Provider, MD  INVOKANA 300 MG TABS Take 1 tablet by mouth daily. 05/22/13   Historical Provider, MD  LEVEMIR 100 UNIT/ML injection Use as directed (will replace Lantus) 05/15/13   Historical Provider, MD  lisinopril (PRINIVIL,ZESTRIL) 10 MG tablet Once a day 11/30/12   Historical Provider, MD  metFORMIN (GLUCOPHAGE) 500 MG tablet Take 1,000 mg by mouth 2 (two) times daily with a meal.      Historical Provider, MD  NOVOFINE 32G X 6 MM MISC Use as directed 05/12/11   Historical Provider, MD  oxymetazoline (AFRIN) 0.05 % nasal spray Place 1-2 sprays into the nose 2 (two) times daily.      Historical Provider, MD  potassium chloride (K-DUR,KLOR-CON) 10 MEQ tablet Take by mouth daily. When taking furosemide     Historical Provider, MD  Potassium Gluconate 550 MG TABS Take 2 tablets by mouth at bedtime.      Historical Provider, MD  sertraline (ZOLOFT) 100 MG tablet  Take 200 mg by mouth daily.      Historical Provider, MD  VICTOZA 18 MG/3ML SOLN daily. 05/12/11   Historical Provider, MD    Allergies  Allergen Reactions  . Penicillins     FAMILY HISTORY:  Family History  Problem Relation Age of Onset  . Leukemia Father   . Diabetes Mother     SOCIAL HISTORY:  reports that he quit smoking about 9 years ago. He has never used smokeless tobacco. He reports that he does not drink alcohol or use illicit drugs.  REVIEW OF SYSTEMS:  A 12-system ROS was conducted and, unless otherwise specified in the HPI, was negative.    PHYSICAL EXAM  VITAL SIGNS: Temp:  [98.2 F (36.8 C)] 98.2 F (36.8 C) (02/20 2300) Pulse Rate:   [107-115] 107 (02/20 2345) Resp:  [16-22] 16 (02/20 2345) BP: (90-104)/(45-54) 94/45 mmHg (02/20 2345) SpO2:  [93 %-96 %] 93 % (02/20 2345) FiO2 (%):  [40 %] 40 % (02/20 2300) Weight:  [327 lb 13.2 oz (148.7 kg)] 327 lb 13.2 oz (148.7 kg) (02/20 2300)  HEMODYNAMICS:    VENTILATOR SETTINGS: Vent Mode:  [-] BIPAP;PCV FiO2 (%):  [40 %] 40 % Set Rate:  [15 bmp] 15 bmp PEEP:  [5 cmH20] 5 cmH20  INTAKE / OUTPUT: Intake/Output   None     PHYSICAL EXAMINATION: General:  Obese male in NAD on BiPAP Neuro:  Intact HEENT:  L Sclera anicteric, s/p enucleation of right eye. Conjunctiva pink. MMM, OP clear, BiPAP mask present Neck:  Trachea supple and midline, (-) LAN or JVD. Obesity limits sensitivity of exam Cardiovascular:  RRR, NS1/S2, (-) MRG Lungs:  Crackles bilaterally Abdomen:  Obese, NT/ND/(+)BS Musculoskeletal:  (-) C/C; 2+ edema to shins bilaterally Skin:  Intact  LABS:  CBC No results found for this basename: WBC, HGB, HCT, PLT,  in the last 72 hours  Coag's No results found for this basename: APTT, INR,  in the last 72 hours  BMET No results found for this basename: NA, K, CL, CO2, BUN, CREATININE, GLUCOSE,  in the last 72 hours  Electrolytes No results found for this basename: CALCIUM, MG, PHOS,  in the last 72 hours  Sepsis Markers No results found for this basename: LACTICACIDVEN, PROCALCITON, O2SATVEN,  in the last 72 hours  ABG No results found for this basename: PHART, PCO2ART, PO2ART,  in the last 72 hours  Liver Enzymes No results found for this basename: AST, ALT, ALKPHOS, BILITOT, ALBUMIN,  in the last 72 hours  Cardiac Enzymes No results found for this basename: TROPONINI, PROBNP,  in the last 72 hours  Glucose Recent Labs     11/24/13  2253  GLUCAP  256*    Imaging No results found.  EKG: EKG from [facility] was personally reviewed. (-) Acute ST changes. CXR: Chest X-ray from [facility] was personally reviewed. There are bilateral  diffuse interstitial opacities without clear focal consolidation. The heart is normal in size.   ASSESSMENT / PLAN: Principal Problem:   Respiratory failure Active Problems:   AKI (acute kidney injury)   Metabolic acidosis   Hypotension, unspecified   DM   C O P D   OSA (obstructive sleep apnea)   PULMONARY A/P: 1. Acute hypoxic respiratory failure: Unclear etiology. Definitely fluid overloaded on exam but CX-ray not classic for edema. Renopulmonary syndrome (goodpastures, etc).  2. OSA 3. COPD  Patient requesting to stay on BiPAP even though settings are 8/5, will maintain for now  Supplemental  O2 to maintain sat >= 92%  Repeat CX-ray here and obtain chest CT  Consider Bronch Based on results   CARDIOVASCULAR A/P:  1. Hypotension: Unclear baseline blood pressure but patient on several anti-hypertesnives at home. ? Sepsis or cardiac event. Initial troponin at OSH negative.  Monitor BP,  No role for additional fluid  Low threshold for pressors  Serial Lactates  Serial Troponins  RENAL A/P:  1. AKI: Again, unclear etiology. ? Sepsis induced vs. [place]-pulmonary syndrome vs. Medication side effect. 2. Metabolic acidosis: Likely 2/2 AKI.   Renal consult   Urine electolytes  Renal US  Serial BMPs  Continue bicarb drip for now pending renal evaluation  GASTROINTESTINAL A/P:  1. No acute issue   HEMATOLOGIC A/P:   1. No acute issue  INFECTIOUS A/P: 1. No obvious infection, however, will emperically cover for now.   Cont emperic levaquin   Check Blood, Urine, Sputum cultures and RVP  ENDOCRINE A/P: 1. DM: Difficulty to control  Restart half dose of lantus  SSI  Check A1c   NEUROLOGIC A/P: 1.  No Acute issues  BEST PRACTICE / DISPOSITION Level of Care:  ICU Consultants:  Renal  Code Status:  Full Diet:  NPO DVT Px:  SQH GI Px:  Not indicated Skin Integrity:  Intact Social / Family:  Updated at bedside  TODAY'S SUMMARY:   I  have personally obtained a history, examined the patient, evaluated laboratory and imaging results, formulated the assessment and plan and placed orders.  CRITICAL CARE: The patient is critically ill with multiple organ systems failure and requires high complexity decision making for assessment and support, frequent evaluation and titration of therapies, application of advanced monitoring technologies and extensive interpretation of multiple databases. Critical Care Time devoted to patient care services described in this note is 60 minutes.   Margarette Asal, MD Pulmonary and [place] Pager: [phone]  11/25/2013, 12:13 AM

## 2013-11-25 ENCOUNTER — Inpatient Hospital Stay (HOSPITAL_COMMUNITY): Payer: 59

## 2013-11-25 ENCOUNTER — Encounter (HOSPITAL_COMMUNITY): Payer: Self-pay | Admitting: *Deleted

## 2013-11-25 DIAGNOSIS — I959 Hypotension, unspecified: Secondary | ICD-10-CM | POA: Diagnosis present

## 2013-11-25 DIAGNOSIS — J449 Chronic obstructive pulmonary disease, unspecified: Secondary | ICD-10-CM

## 2013-11-25 DIAGNOSIS — J9600 Acute respiratory failure, unspecified whether with hypoxia or hypercapnia: Secondary | ICD-10-CM

## 2013-11-25 DIAGNOSIS — N179 Acute kidney failure, unspecified: Secondary | ICD-10-CM

## 2013-11-25 DIAGNOSIS — J9601 Acute respiratory failure with hypoxia: Secondary | ICD-10-CM

## 2013-11-25 DIAGNOSIS — E872 Acidosis, unspecified: Secondary | ICD-10-CM

## 2013-11-25 LAB — GLUCOSE, CAPILLARY
GLUCOSE-CAPILLARY: 403 mg/dL — AB (ref 70–99)
GLUCOSE-CAPILLARY: 422 mg/dL — AB (ref 70–99)
GLUCOSE-CAPILLARY: 375 mg/dL — AB (ref 70–99)
GLUCOSE-CAPILLARY: 304 mg/dL — AB (ref 70–99)
Glucose-Capillary: 170 mg/dL — ABNORMAL HIGH (ref 70–99)
Glucose-Capillary: 200 mg/dL — ABNORMAL HIGH (ref 70–99)
Glucose-Capillary: 224 mg/dL — ABNORMAL HIGH (ref 70–99)
Glucose-Capillary: 252 mg/dL — ABNORMAL HIGH (ref 70–99)
Glucose-Capillary: 335 mg/dL — ABNORMAL HIGH (ref 70–99)
Glucose-Capillary: 354 mg/dL — ABNORMAL HIGH (ref 70–99)
Glucose-Capillary: 374 mg/dL — ABNORMAL HIGH (ref 70–99)
Glucose-Capillary: 432 mg/dL — ABNORMAL HIGH (ref 70–99)

## 2013-11-25 LAB — BASIC METABOLIC PANEL
BUN: 80 mg/dL — ABNORMAL HIGH (ref 6–23)
BUN: 81 mg/dL — AB (ref 6–23)
BUN: 85 mg/dL — ABNORMAL HIGH (ref 6–23)
BUN: 88 mg/dL — ABNORMAL HIGH (ref 6–23)
CHLORIDE: 94 meq/L — AB (ref 96–112)
CHLORIDE: 94 meq/L — AB (ref 96–112)
CO2: 12 mEq/L — ABNORMAL LOW (ref 19–32)
CO2: 12 meq/L — AB (ref 19–32)
CO2: 15 mEq/L — ABNORMAL LOW (ref 19–32)
CO2: 18 meq/L — AB (ref 19–32)
CREATININE: 5.13 mg/dL — AB (ref 0.50–1.35)
CREATININE: 5.26 mg/dL — AB (ref 0.50–1.35)
CREATININE: 5.17 mg/dL — AB (ref 0.50–1.35)
Calcium: 8.6 mg/dL (ref 8.4–10.5)
Calcium: 8.4 mg/dL (ref 8.4–10.5)
Calcium: 8.1 mg/dL — ABNORMAL LOW (ref 8.4–10.5)
Calcium: 8 mg/dL — ABNORMAL LOW (ref 8.4–10.5)
Chloride: 97 mEq/L (ref 96–112)
Chloride: 95 mEq/L — ABNORMAL LOW (ref 96–112)
Creatinine, Ser: 4.95 mg/dL — ABNORMAL HIGH (ref 0.50–1.35)
GFR calc Af Amer: 12 mL/min — ABNORMAL LOW (ref >90)
GFR calc Af Amer: 13 mL/min — ABNORMAL LOW (ref >90)
GFR calc non Af Amer: 11 mL/min — ABNORMAL LOW (ref >90)
GFR calc non Af Amer: 11 mL/min — ABNORMAL LOW (ref >90)
GFR calc non Af Amer: 11 mL/min — ABNORMAL LOW (ref >90)
GFR calc non Af Amer: 11 mL/min — ABNORMAL LOW (ref >90)
GFR, EST AFRICAN AMERICAN: 13 mL/min — AB (ref >90)
GFR, EST AFRICAN AMERICAN: 13 mL/min — AB (ref >90)
GLUCOSE: 469 mg/dL — AB (ref 70–99)
Glucose, Bld: 405 mg/dL — ABNORMAL HIGH (ref 70–99)
Glucose, Bld: 429 mg/dL — ABNORMAL HIGH (ref 70–99)
Glucose, Bld: 354 mg/dL — ABNORMAL HIGH (ref 70–99)
POTASSIUM: 5.5 meq/L — AB (ref 3.7–5.3)
POTASSIUM: 4.4 meq/L (ref 3.7–5.3)
Potassium: 6 mEq/L — ABNORMAL HIGH (ref 3.7–5.3)
Potassium: 3.8 mEq/L (ref 3.7–5.3)
Sodium: 132 mEq/L — ABNORMAL LOW (ref 137–147)
Sodium: 133 mEq/L — ABNORMAL LOW (ref 137–147)
Sodium: 132 mEq/L — ABNORMAL LOW (ref 137–147)
Sodium: 133 mEq/L — ABNORMAL LOW (ref 137–147)

## 2013-11-25 LAB — CBC WITH DIFFERENTIAL/PLATELET
BASOS ABS: 0 10*3/uL (ref 0.0–0.1)
BASOS PCT: 0 % (ref 0–1)
EOS ABS: 0 10*3/uL (ref 0.0–0.7)
Eosinophils Relative: 0 % (ref 0–5)
HCT: 29.4 % — ABNORMAL LOW (ref 39.0–52.0)
Hemoglobin: 9.5 g/dL — ABNORMAL LOW (ref 13.0–17.0)
LYMPHS PCT: 4 % — AB (ref 12–46)
Lymphs Abs: 0.6 10*3/uL — ABNORMAL LOW (ref 0.7–4.0)
MCH: 28.5 pg (ref 26.0–34.0)
MCHC: 32.3 g/dL (ref 30.0–36.0)
MCV: 88.3 fL (ref 78.0–100.0)
MONOS PCT: 4 % (ref 3–12)
Monocytes Absolute: 0.6 10*3/uL (ref 0.1–1.0)
NEUTROS PCT: 92 % — AB (ref 43–77)
Neutro Abs: 14.8 10*3/uL — ABNORMAL HIGH (ref 1.7–7.7)
Platelets: 309 10*3/uL (ref 150–400)
RBC: 3.33 MIL/uL — ABNORMAL LOW (ref 4.22–5.81)
RDW: 16 % — ABNORMAL HIGH (ref 11.5–15.5)
WBC Morphology: INCREASED
WBC: 16 10*3/uL — ABNORMAL HIGH (ref 4.0–10.5)

## 2013-11-25 LAB — STREP PNEUMONIAE URINARY ANTIGEN: STREP PNEUMO URINARY ANTIGEN: NEGATIVE

## 2013-11-25 LAB — COMPREHENSIVE METABOLIC PANEL
ALT: 22 U/L (ref 0–53)
AST: 20 U/L (ref 0–37)
Albumin: 2.2 g/dL — ABNORMAL LOW (ref 3.5–5.2)
Alkaline Phosphatase: 127 U/L — ABNORMAL HIGH (ref 39–117)
BUN: 76 mg/dL — AB (ref 6–23)
CALCIUM: 8.3 mg/dL — AB (ref 8.4–10.5)
CO2: 11 meq/L — AB (ref 19–32)
CREATININE: 5.07 mg/dL — AB (ref 0.50–1.35)
Chloride: 96 mEq/L (ref 96–112)
GFR calc Af Amer: 13 mL/min — ABNORMAL LOW (ref >90)
GFR, EST NON AFRICAN AMERICAN: 11 mL/min — AB (ref >90)
GLUCOSE: 373 mg/dL — AB (ref 70–99)
Potassium: 5.8 mEq/L — ABNORMAL HIGH (ref 3.7–5.3)
Sodium: 129 mEq/L — ABNORMAL LOW (ref 137–147)
Total Bilirubin: <0.2 mg/dL — ABNORMAL LOW (ref 0.3–1.2)
Total Protein: 7 g/dL (ref 6.0–8.3)

## 2013-11-25 LAB — POCT I-STAT 3, ART BLOOD GAS (G3+)
Acid-base deficit: 16 mmol/L — ABNORMAL HIGH (ref 0.0–2.0)
Acid-base deficit: 15 mmol/L — ABNORMAL HIGH (ref 0.0–2.0)
Acid-base deficit: 9 mmol/L — ABNORMAL HIGH (ref 0.0–2.0)
BICARBONATE: 16.2 meq/L — AB (ref 20.0–24.0)
Bicarbonate: 12.6 mEq/L — ABNORMAL LOW (ref 20.0–24.0)
Bicarbonate: 12.7 mEq/L — ABNORMAL LOW (ref 20.0–24.0)
O2 SAT: 94 %
O2 Saturation: 84 %
O2 Saturation: 94 %
PCO2 ART: 37 mmHg (ref 35.0–45.0)
PCO2 ART: 34.9 mmHg — AB (ref 35.0–45.0)
PH ART: 7.14 — AB (ref 7.350–7.450)
PH ART: 7.305 — AB (ref 7.350–7.450)
PO2 ART: 91 mmHg (ref 80.0–100.0)
PO2 ART: 75 mmHg — AB (ref 80.0–100.0)
Patient temperature: 98.6
Patient temperature: 98.6
Patient temperature: 98
TCO2: 14 mmol/L (ref 0–100)
TCO2: 14 mmol/L (ref 0–100)
TCO2: 17 mmol/L (ref 0–100)
pCO2 arterial: 32.3 mmHg — ABNORMAL LOW (ref 35.0–45.0)
pH, Arterial: 7.168 — CL (ref 7.350–7.450)
pO2, Arterial: 61 mmHg — ABNORMAL LOW (ref 80.0–100.0)

## 2013-11-25 LAB — URINALYSIS, ROUTINE W REFLEX MICROSCOPIC
BILIRUBIN URINE: NEGATIVE
GLUCOSE, UA: 500 mg/dL — AB
GLUCOSE, UA: 500 mg/dL — AB
KETONES UR: NEGATIVE mg/dL
Ketones, ur: NEGATIVE mg/dL
Leukocytes, UA: NEGATIVE
Nitrite: NEGATIVE
Nitrite: NEGATIVE
Protein, ur: 100 mg/dL — AB
Protein, ur: NEGATIVE mg/dL
SPECIFIC GRAVITY, URINE: 1.027 (ref 1.005–1.030)
SPECIFIC GRAVITY, URINE: 1.016 (ref 1.005–1.030)
Urobilinogen, UA: 0.2 mg/dL (ref 0.0–1.0)
Urobilinogen, UA: 0.2 mg/dL (ref 0.0–1.0)
pH: 5 (ref 5.0–8.0)
pH: 5 (ref 5.0–8.0)

## 2013-11-25 LAB — TROPONIN I
Troponin I: <0.3 ng/mL (ref <0.30)
Troponin I: <0.3 ng/mL (ref <0.30)
Troponin I: <0.3 ng/mL (ref <0.30)

## 2013-11-25 LAB — CBC
HCT: 30.4 % — ABNORMAL LOW (ref 39.0–52.0)
HEMOGLOBIN: 10 g/dL — AB (ref 13.0–17.0)
MCH: 28.6 pg (ref 26.0–34.0)
MCHC: 32.9 g/dL (ref 30.0–36.0)
MCV: 86.9 fL (ref 78.0–100.0)
Platelets: 349 10*3/uL (ref 150–400)
RBC: 3.5 MIL/uL — ABNORMAL LOW (ref 4.22–5.81)
RDW: 16.2 % — ABNORMAL HIGH (ref 11.5–15.5)
WBC: 13.5 10*3/uL — ABNORMAL HIGH (ref 4.0–10.5)

## 2013-11-25 LAB — URINE MICROSCOPIC-ADD ON

## 2013-11-25 LAB — IRON AND TIBC
IRON: 47 ug/dL (ref 42–135)
Saturation Ratios: 23 % (ref 20–55)
TIBC: 204 ug/dL — ABNORMAL LOW (ref 215–435)
UIBC: 157 ug/dL (ref 125–400)

## 2013-11-25 LAB — PHOSPHORUS
PHOSPHORUS: 4.7 mg/dL — AB (ref 2.3–4.6)
Phosphorus: 5.1 mg/dL — ABNORMAL HIGH (ref 2.3–4.6)

## 2013-11-25 LAB — PROTIME-INR
INR: 1.16 (ref 0.00–1.49)
PROTHROMBIN TIME: 14.6 s (ref 11.6–15.2)

## 2013-11-25 LAB — LACTIC ACID, PLASMA
LACTIC ACID, VENOUS: 1 mmol/L (ref 0.5–2.2)
Lactic Acid, Venous: 0.9 mmol/L (ref 0.5–2.2)
Lactic Acid, Venous: 1.1 mmol/L (ref 0.5–2.2)

## 2013-11-25 LAB — UREA NITROGEN, URINE: UREA NITROGEN UR: 148 mg/dL

## 2013-11-25 LAB — CORTISOL: Cortisol, Plasma: 37.6 ug/dL

## 2013-11-25 LAB — PRO B NATRIURETIC PEPTIDE: PRO B NATRI PEPTIDE: 199.5 pg/mL — AB (ref 0–125)

## 2013-11-25 LAB — PROCALCITONIN: Procalcitonin: 2.5 ng/mL

## 2013-11-25 LAB — SODIUM, URINE, RANDOM: Sodium, Ur: 32 mEq/L

## 2013-11-25 LAB — SEDIMENTATION RATE: Sed Rate: >140 mm/hr — ABNORMAL HIGH (ref 0–16)

## 2013-11-25 LAB — MRSA PCR SCREENING: MRSA by PCR: NEGATIVE

## 2013-11-25 LAB — MAGNESIUM
MAGNESIUM: 2.1 mg/dL (ref 1.5–2.5)
MAGNESIUM: 2.2 mg/dL (ref 1.5–2.5)

## 2013-11-25 LAB — CREATININE, URINE, RANDOM: Creatinine, Urine: 235.69 mg/dL

## 2013-11-25 IMAGING — US US RENAL
1 series · 14 of 14 positions shown · non-contrast
Comparison: None.

CLINICAL DATA: Elevated creatinine, diabetes

EXAM:
RENAL/URINARY TRACT ULTRASOUND COMPLETE

[Series 1: us renal · 0.28mm/px · 14 of 14 slices shown]
[im 1/14]
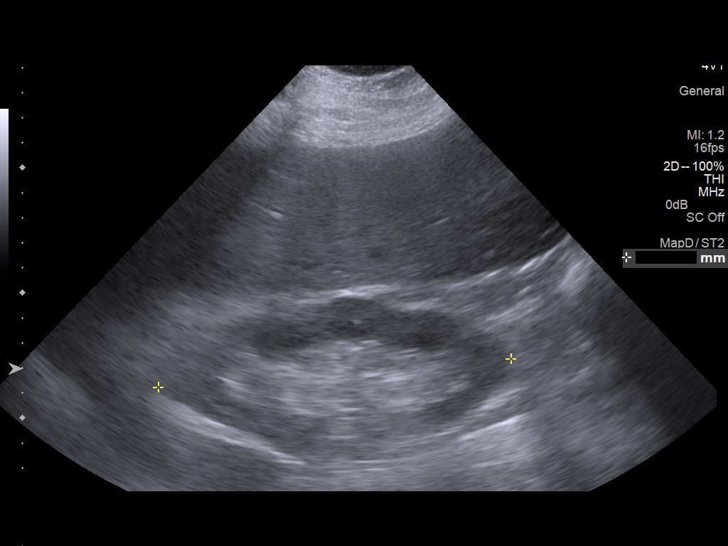
[im 2/14]
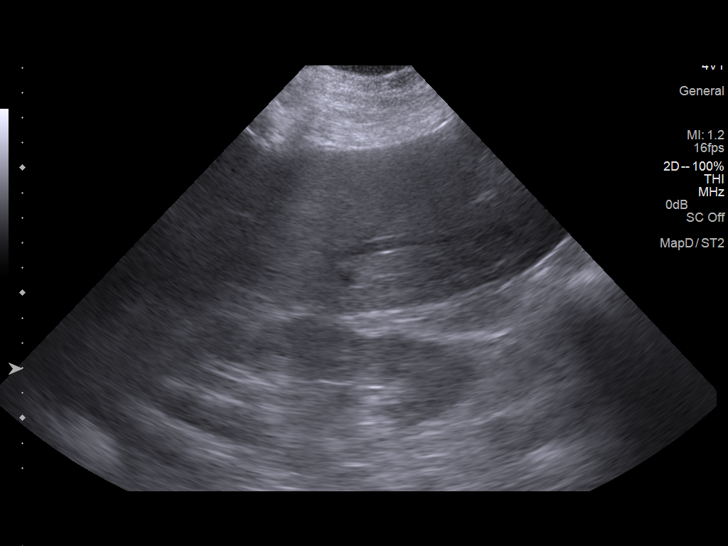
[im 3/14]
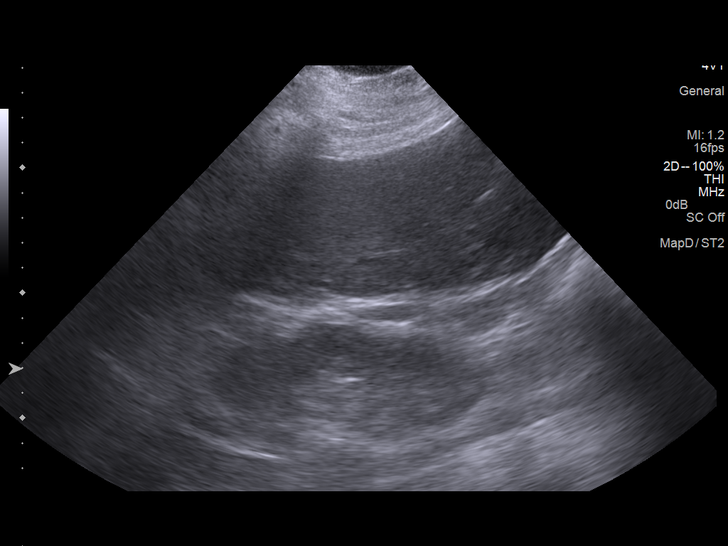
[im 4/14]
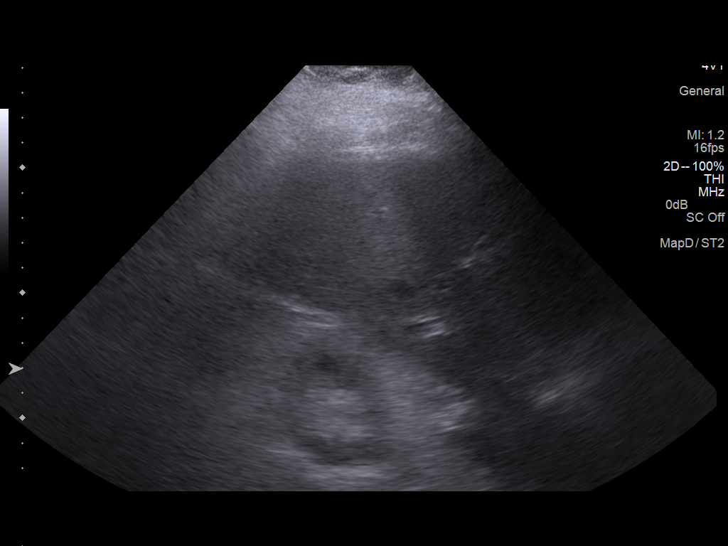
[im 5/14]
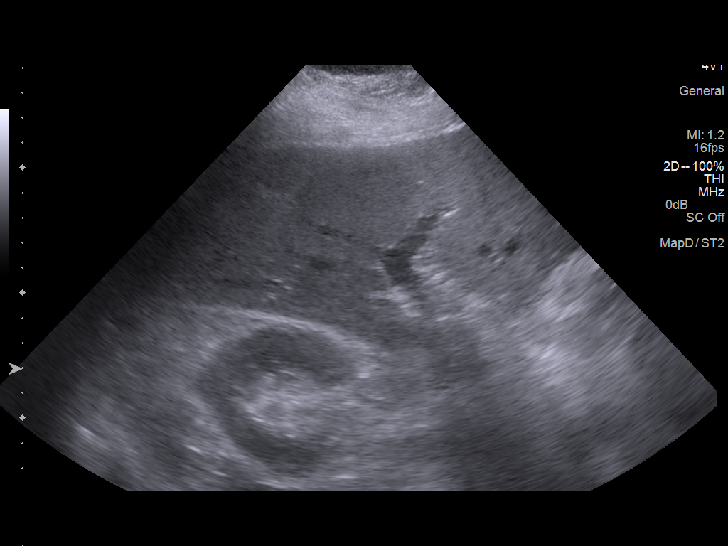
[im 6/14]
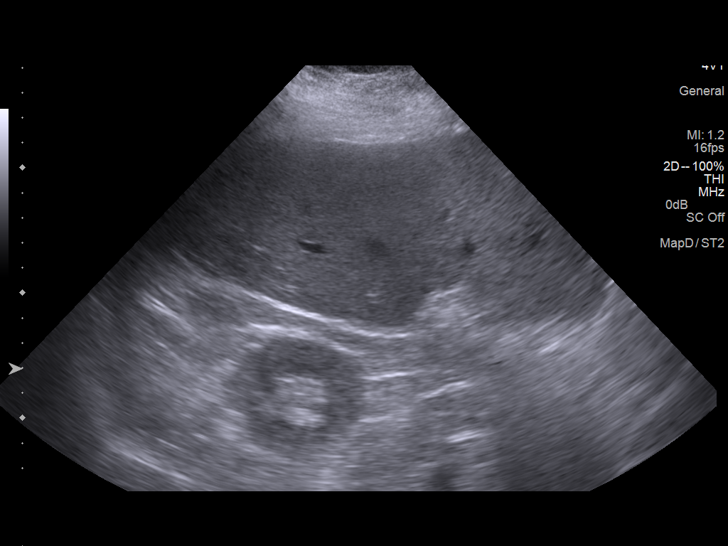
[im 7/14]
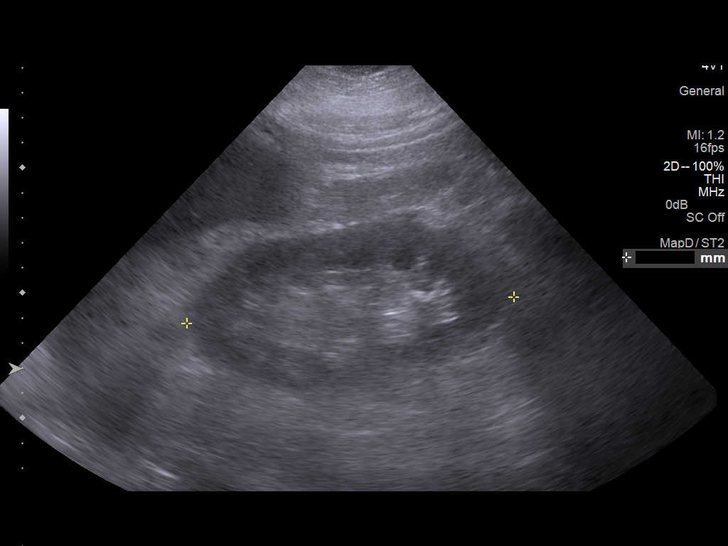
[im 8/14]
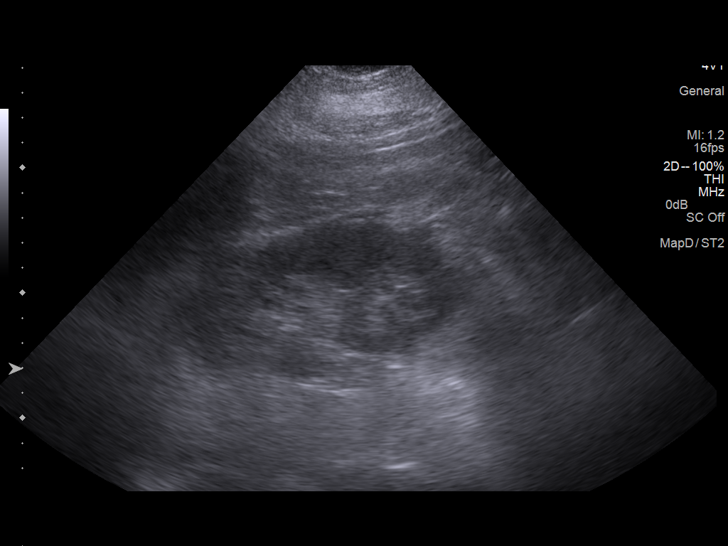
[im 9/14]
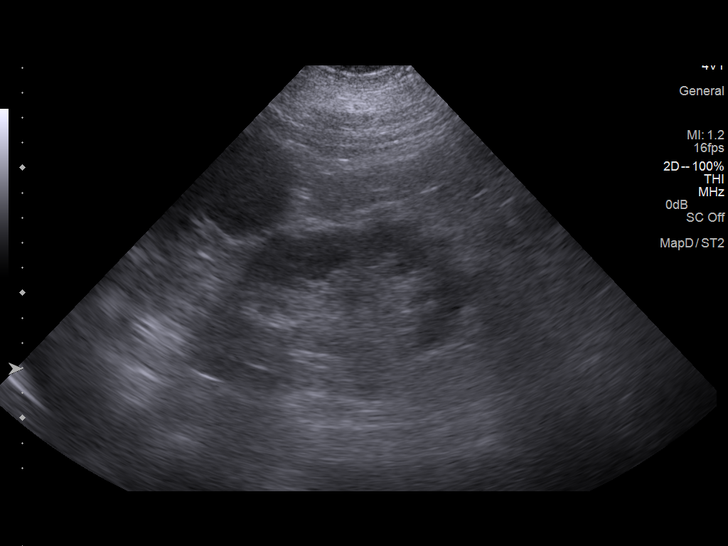
[im 10/14]
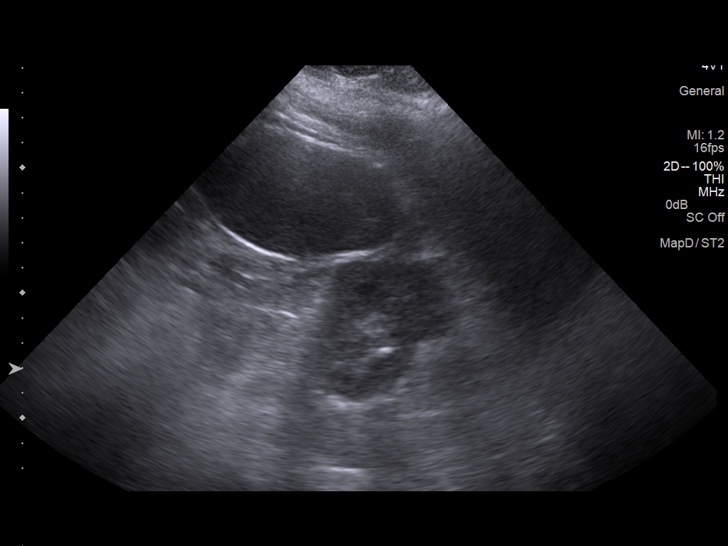
[im 11/14]
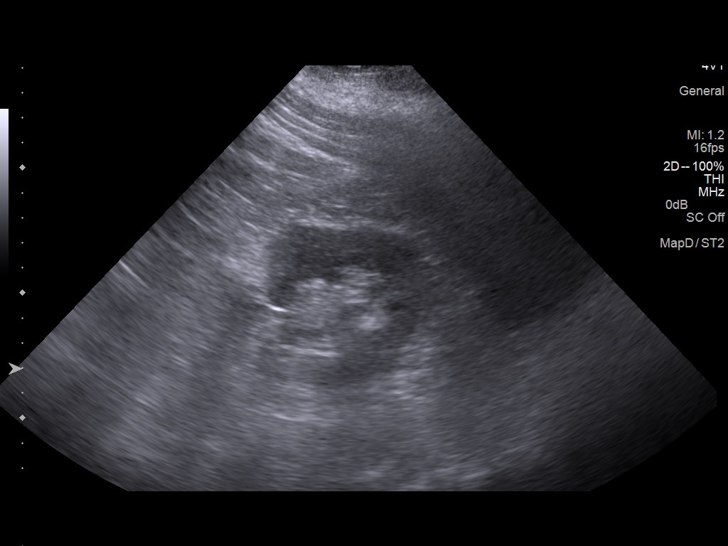
[im 12/14]
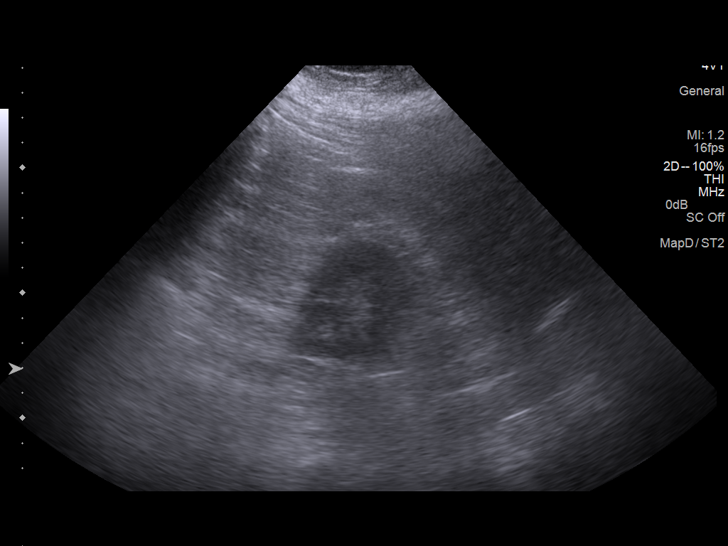
[im 13/14]
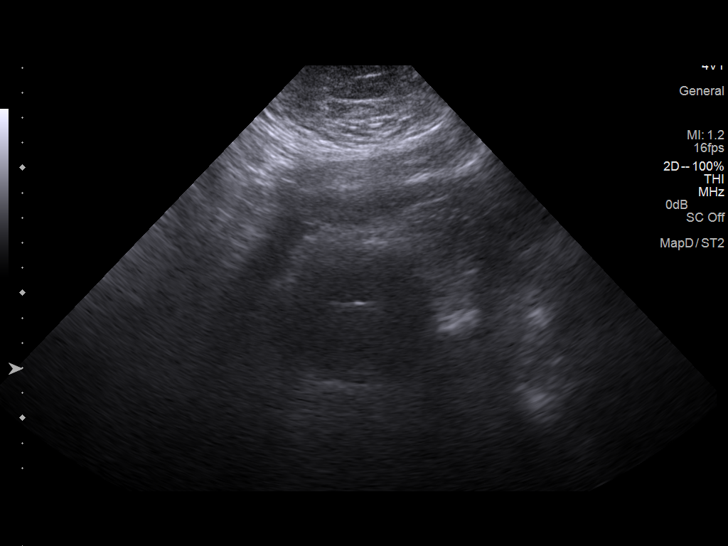
[im 14/14]
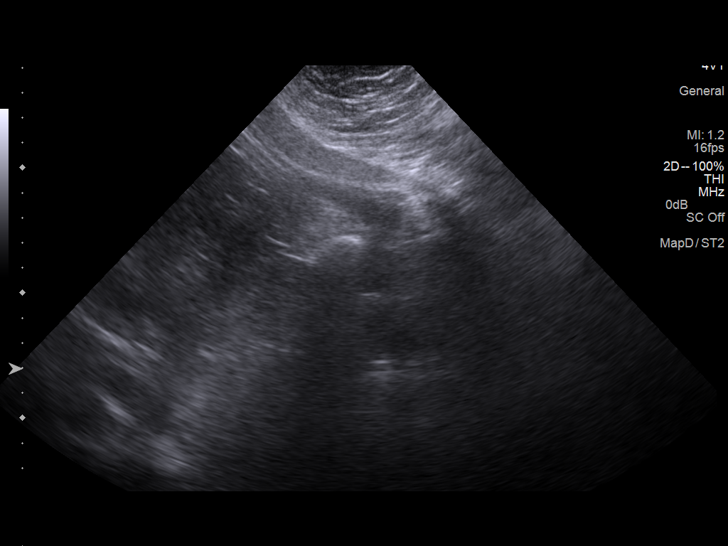

[14 of 14 positions shown; findings below may reference images not displayed]

FINDINGS: Right Kidney:

Length: 14.1 cm. Echogenicity within normal limits. No mass or
hydronephrosis visualized.

Left Kidney:

Length: 13.1 cm. Echogenicity within normal limits. No mass or
hydronephrosis visualized.

Bladder:

Decompressed by Foley catheter
IMPRESSION: No acute finding by ultrasound.  Negative for hydronephrosis.

## 2013-11-25 IMAGING — CR DG CHEST 1V PORT
1 series · 1 of 1 positions shown · non-contrast
Comparison: Portable exam [XJ] hr compared to [DATE] at [XJ] hr

CLINICAL DATA: Right jugular dialysis catheter placement

EXAM:
PORTABLE CHEST - 1 VIEW

[AP]
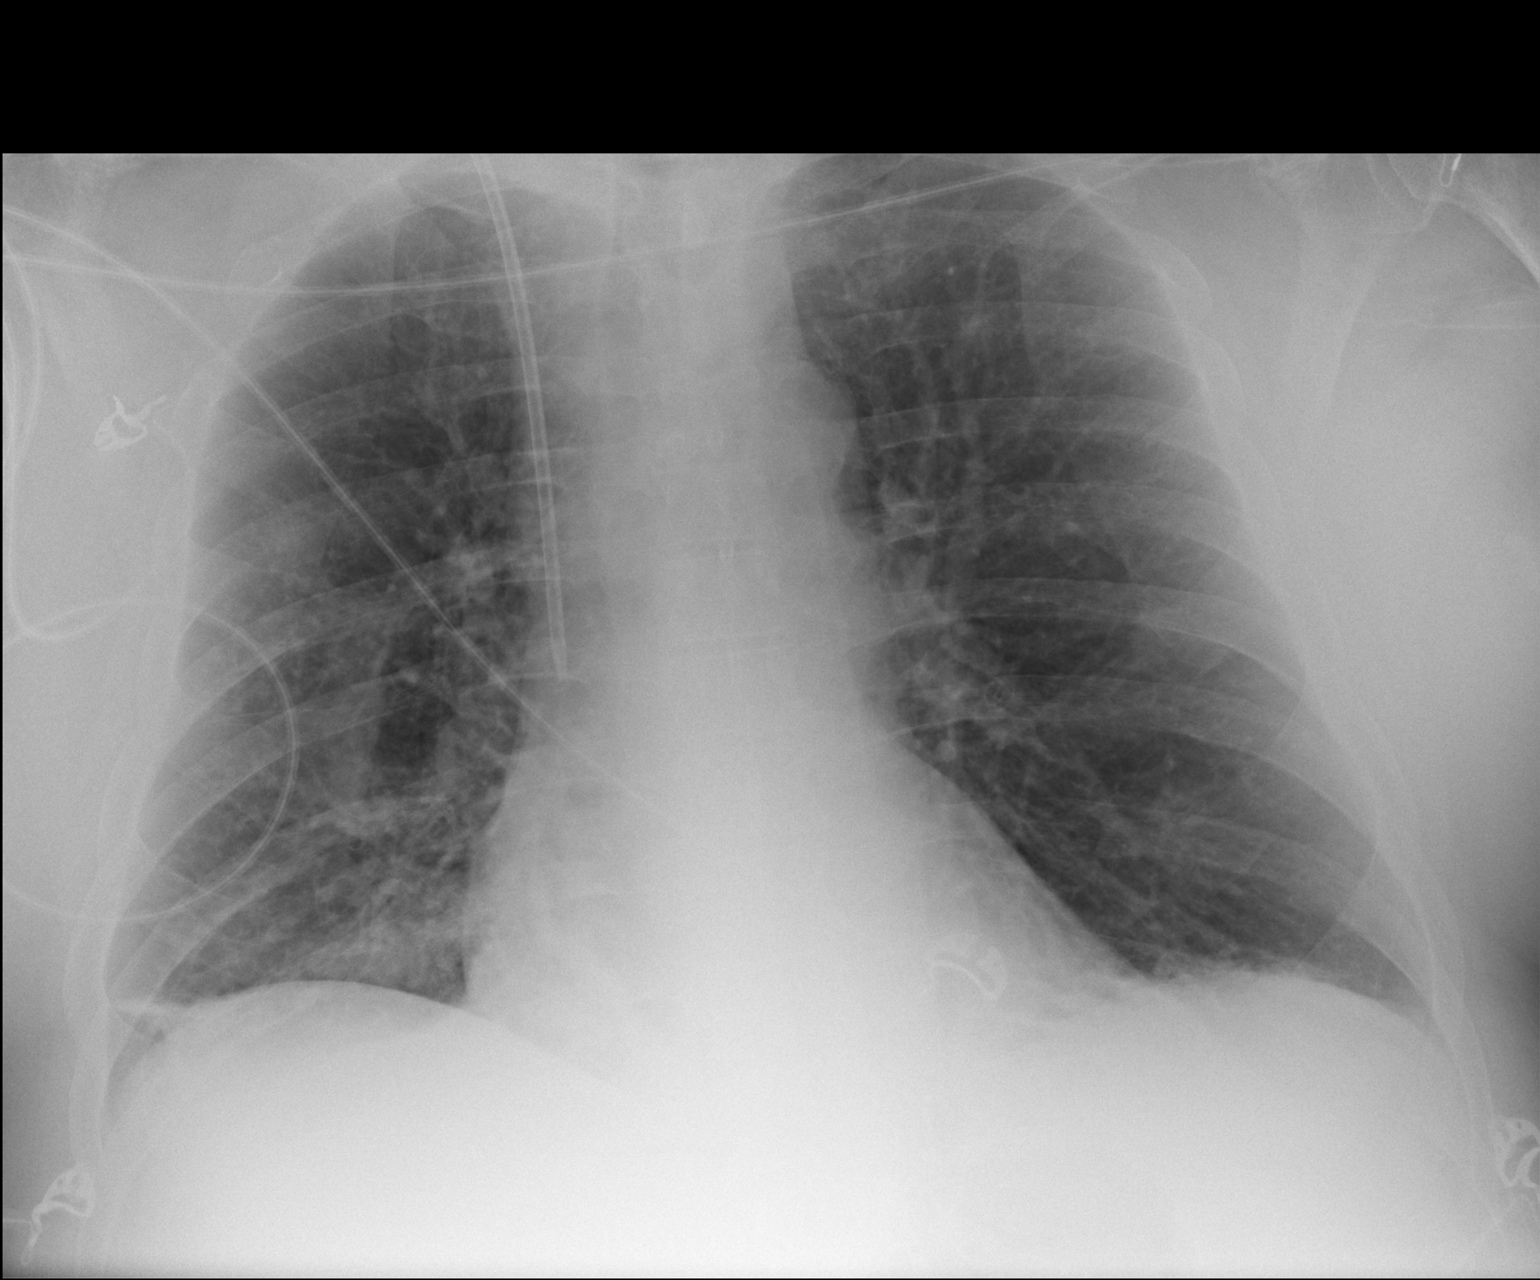

[1 of 1 positions shown; findings below may reference images not displayed]

FINDINGS: New right jugular central venous catheter with tip projecting over
mid SVC.

Normal heart size and mediastinal contours.

Pulmonary vascular congestion.

Improved interstitial infiltrates since previous exam.

Bibasilar atelectasis.

No definite pleural effusion or pneumothorax.
IMPRESSION: Bibasilar atelectasis.

No pneumothorax following right jugular line placement.

The rapid improvement an infiltrates since the earlier study of
[DATE] suggests improved edema.

## 2013-11-25 IMAGING — CT CT CHEST W/O CM
2 of 4 series · 15 of 36 positions shown, 18 images · non-contrast
Comparison: [DATE]

CLINICAL DATA: Worsening shortness of breath.  Respiratory failure.

EXAM:
CT CHEST WITHOUT CONTRAST
TECHNIQUE: Multidetector CT imaging of the chest was performed following the
standard protocol without IV contrast.

[Series 3: thorax 5.0 i31f 1 · axial · 0.85mm/px · z∈[-885,-655]mm · 12 of 56 slices shown, 15 images]
[im 5/56  mediastinal]
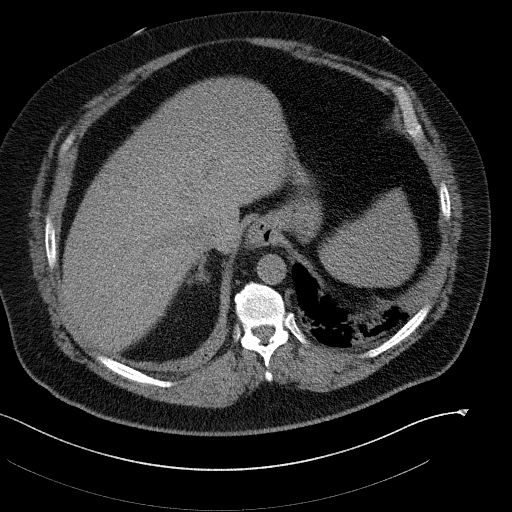
[im 5/56  lung]
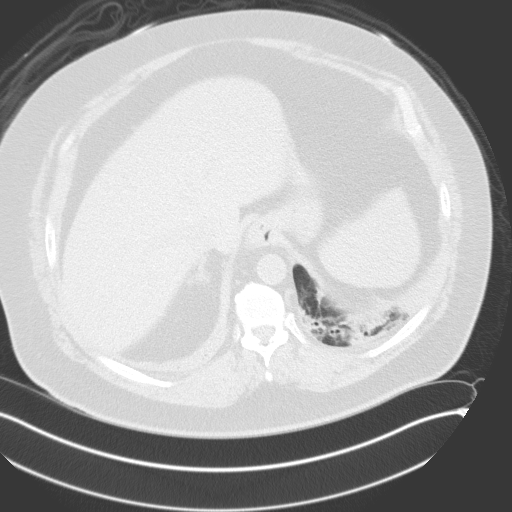
[im 9/56  lung]
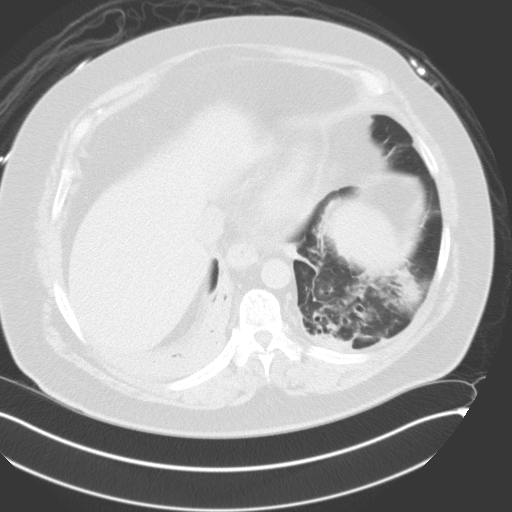
[im 13/56  lung]
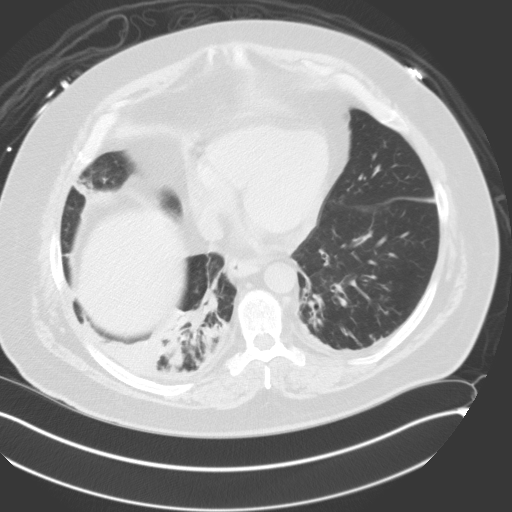
[im 17/56  lung]
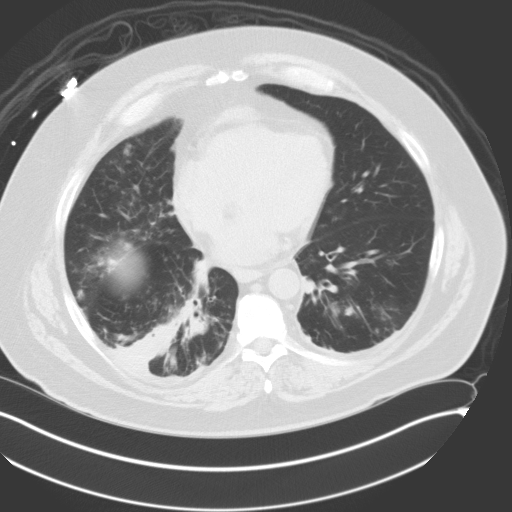
[im 22/56  mediastinal]
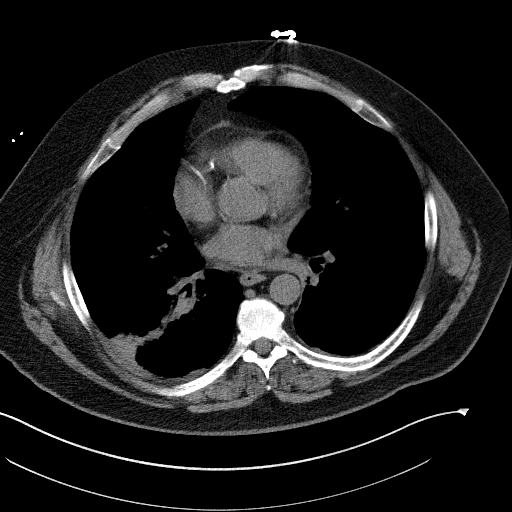
[im 22/56  lung]
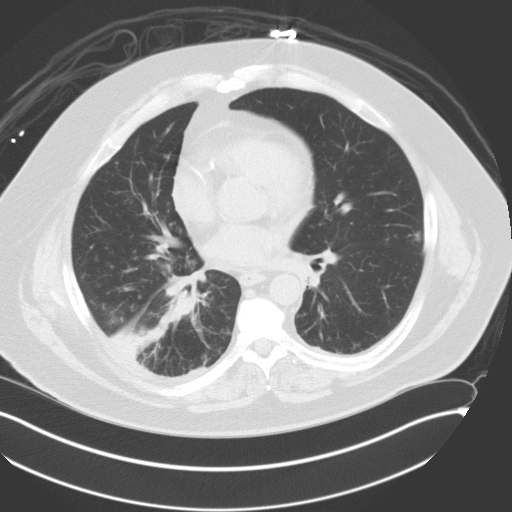
[im 26/56  lung]
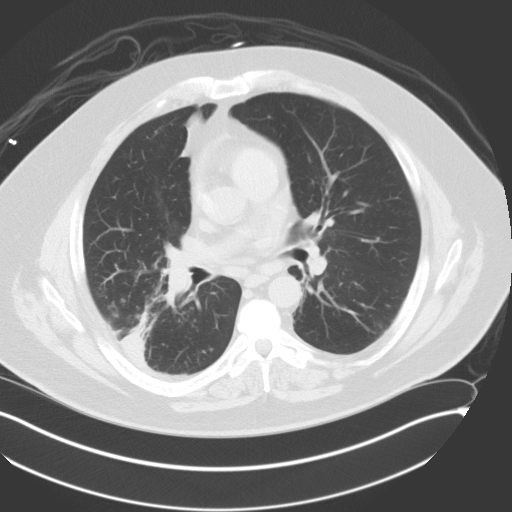
[im 30/56  lung]
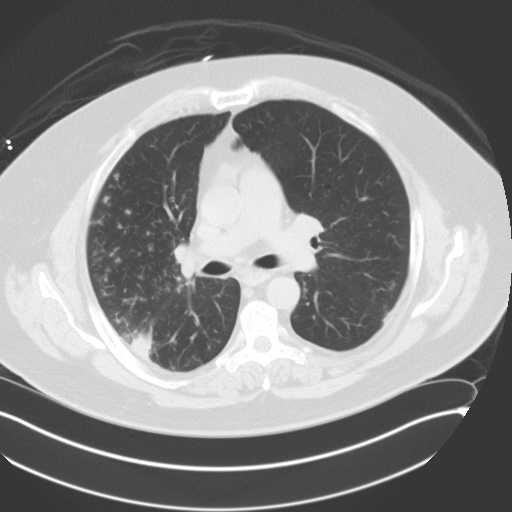
[im 34/56  lung]
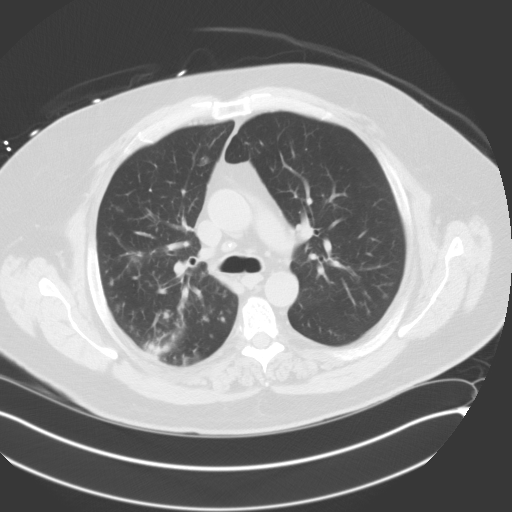
[im 39/56  mediastinal]
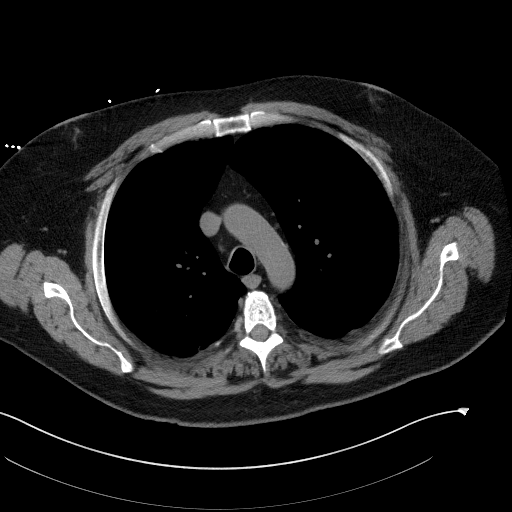
[im 39/56  lung]
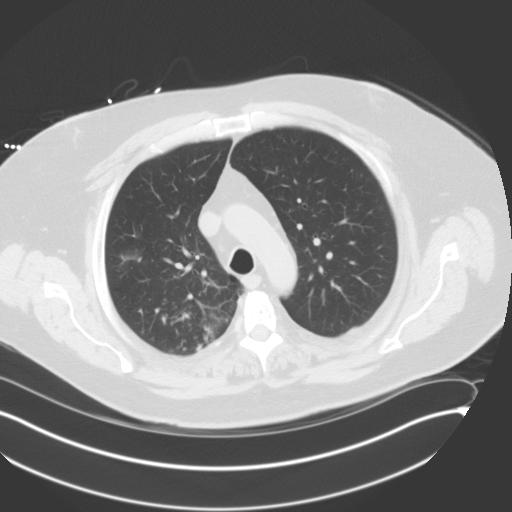
[im 43/56  lung]
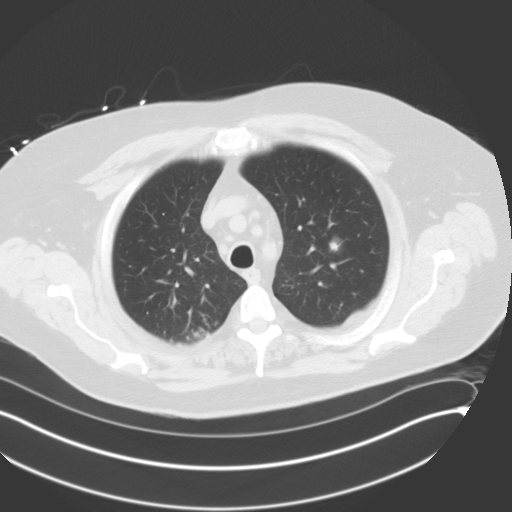
[im 47/56  lung]
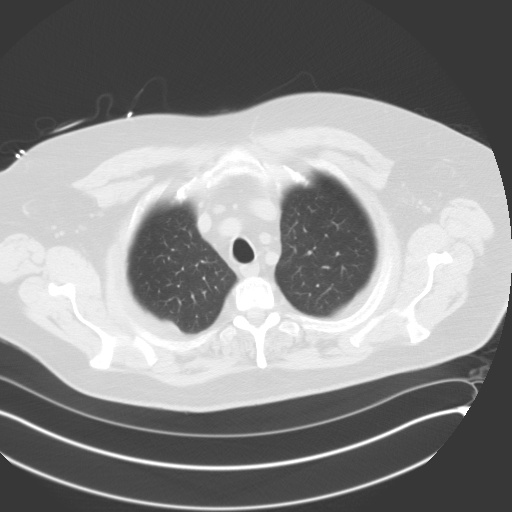
[im 51/56  lung]
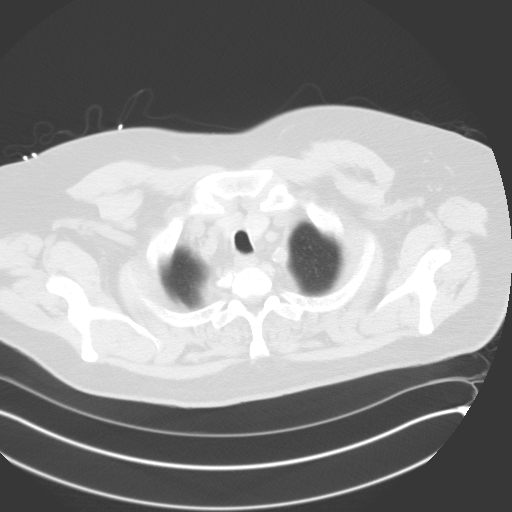

[Series 602: cor · coronal · 0.85mm/px · 3 of 138 slices shown]
[im 28/138  lung]
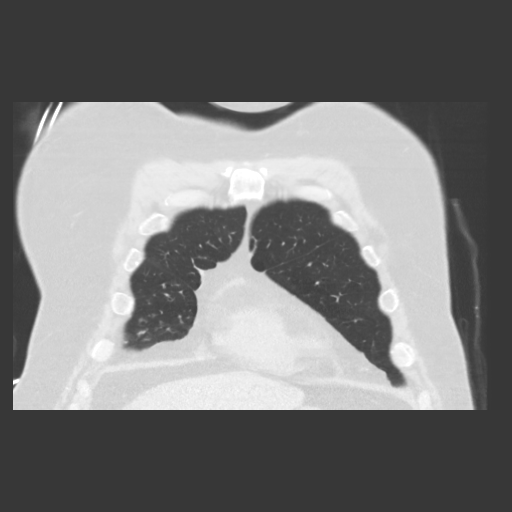
[im 55/138  lung]
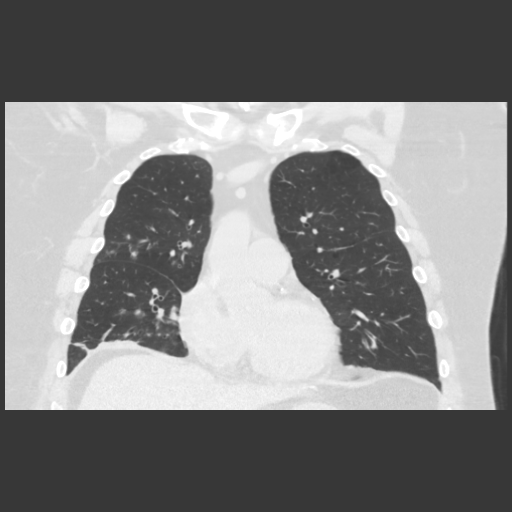
[im 83/138  lung]
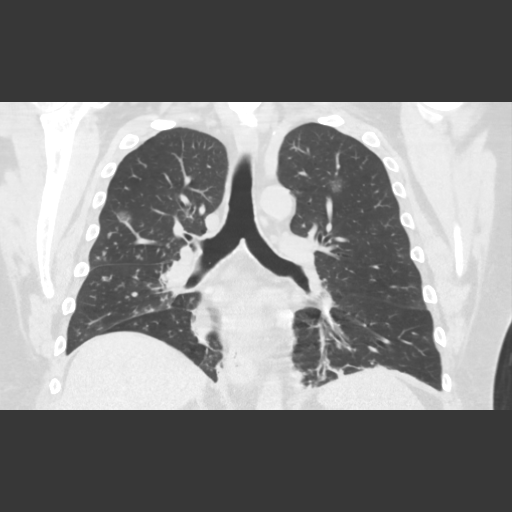

[15 of 36 positions shown; findings below may reference images not displayed]

FINDINGS: No pleural effusion identified. Airspace consolidation and
atelectasis is noted within the posterior right lower lobe.

There are multiple tree-in-bud nodules identified throughout the
right upper lobe, right lower lobe and right middle lobe. Solid
nodule with surrounding ground-glass attenuation is identified in
the right upper lobe measuring 1.8 cm, image 20/series 4. Similarly
there is a left lower lobe, solid nodule measuring 1.5 cm, image
38/series 4. Bronchiectasis is identified within both lower lobes,
left greater than right. A few peripheral nodules may have central
cavitation. For example, within the periphery of the right middle
lobe there is a 1.6 cm nodule, image 28/ series 4. Within the left
lower lobe there is a 1.2 cm cavitary nodule.

The trachea appears patent and is midline. The heart size appears
normal. Calcified stress set partially calcified right paratracheal
and sub- carinal lymph nodes are identified compatible with prior
granulomatous disease. Noncalcified right paratracheal lymph node
measures 8 mm, image 13/series 3. Noncalcified sub- carinal lymph
node is enlarged measuring 1.1 cm. Calcified atherosclerotic disease
also involves the thoracic aorta as well as the LAD and RCA coronary
arteries.

No axillary or supraclavicular adenopathy. Incidental imaging
through the upper abdomen is unremarkable.

Review of the visualized bony structures is on unremarkable.
IMPRESSION: 1. Multiple abnormal findings are identified in both lungs which are
likely inflammatory or infectious in etiology.
2. Bilateral lower lobe bronchiectasis left-greater-than-right is
favored to be the sequela of chronic aspiration.
3. Multiple tree in bud nodules are scattered throughout the right
lung compatible with inflammatory or infectious bronchiolitis.
4. Multi focal nodules with either central cavitation and or
peripheral ground-glass attenuation are noted. Differential
considerations include septic emboli as well as fungal infection.
5. A few calcified mediastinal lymph nodes are identified compatible
with prior granulomatous disease.
6. Atherosclerotic disease including coronary artery calcifications.

## 2013-11-25 IMAGING — CR DG CHEST 1V PORT
1 series · 1 of 1 positions shown · non-contrast
Comparison: DG CHEST 1V PORT dated [DATE]; CT ANGIO CHEST dated
[DATE]

CLINICAL DATA: Hypoxia.

EXAM:
PORTABLE CHEST - 1 VIEW

[AP]
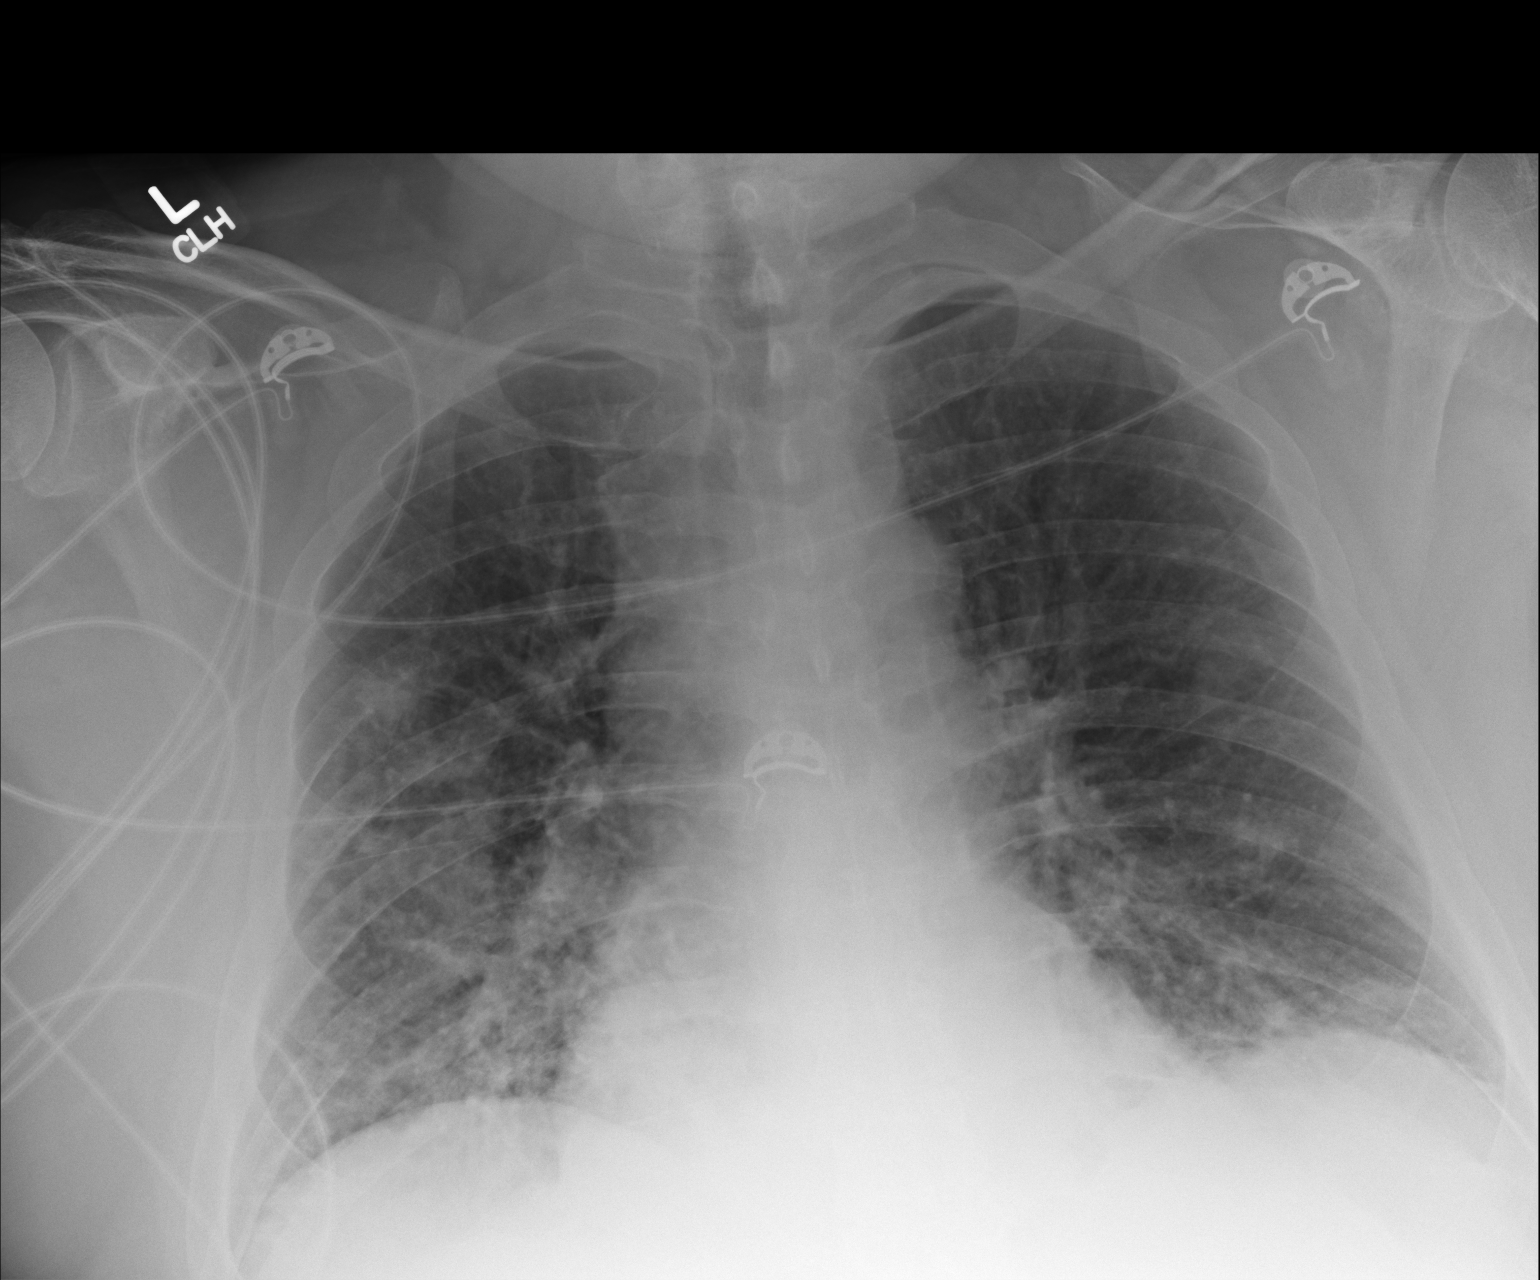

[1 of 1 positions shown; findings below may reference images not displayed]

FINDINGS: Mediastinum and hilar structures are normal. Patchy bilateral
pulmonary infiltrates are again noted. Heart size normal. No pleural
effusion or pneumothorax. No acute bony abnormality.
IMPRESSION: Prominent patchy bilateral pulmonary infiltrates consistent with
pneumonia. Close follow-up chest x-rays recommended to demonstrate
complete clearing.

## 2013-11-25 MED ORDER — BUDESONIDE-FORMOTEROL FUMARATE 160-4.5 MCG/ACT IN AERO
2.0000 | INHALATION_SPRAY | Freq: Two times a day (BID) | RESPIRATORY_TRACT | Status: DC
  Administered 2013-11-25 – 2013-11-30 (×11): 2 via RESPIRATORY_TRACT
  Filled 2013-11-25: qty 6

## 2013-11-25 MED ORDER — BIOTENE DRY MOUTH MT LIQD
15.0000 mL | Freq: Two times a day (BID) | OROMUCOSAL | Status: DC
  Administered 2013-11-25 – 2013-11-30 (×7): 15 mL via OROMUCOSAL

## 2013-11-25 MED ORDER — ALBUTEROL SULFATE (2.5 MG/3ML) 0.083% IN NEBU
2.5000 mg | INHALATION_SOLUTION | RESPIRATORY_TRACT | Status: DC
  Administered 2013-11-25 – 2013-11-26 (×13): 2.5 mg via RESPIRATORY_TRACT
  Filled 2013-11-25 (×12): qty 3

## 2013-11-25 MED ORDER — SODIUM CHLORIDE 0.9 % IR SOLN: Freq: Once | Status: DC

## 2013-11-25 MED ORDER — INSULIN GLARGINE 100 UNIT/ML ~~LOC~~ SOLN: 50.0000 [IU] | Freq: Every day | SUBCUTANEOUS | Status: DC

## 2013-11-25 MED ORDER — DEXTROSE 5 % IV SOLN
1.0000 g | Freq: Once | INTRAVENOUS | Status: AC
  Administered 2013-11-25: 1 g via INTRAVENOUS
  Filled 2013-11-25: qty 1

## 2013-11-25 MED ORDER — FUROSEMIDE 10 MG/ML IJ SOLN
160.0000 mg | Freq: Once | INTRAVENOUS | Status: AC
  Administered 2013-11-25: 160 mg via INTRAVENOUS
  Filled 2013-11-25: qty 16

## 2013-11-25 MED ORDER — INSULIN ASPART 100 UNIT/ML ~~LOC~~ SOLN
0.0000 [IU] | Freq: Three times a day (TID) | SUBCUTANEOUS | Status: DC
  Administered 2013-11-25: 20 [IU] via SUBCUTANEOUS

## 2013-11-25 MED ORDER — INSULIN GLARGINE 100 UNIT/ML ~~LOC~~ SOLN: 75.0000 [IU] | Freq: Every day | SUBCUTANEOUS | Status: DC

## 2013-11-25 MED ORDER — DEXTROSE 5 % IV SOLN
INTRAVENOUS | Status: DC
  Administered 2013-11-25: 22:00:00 via INTRAVENOUS

## 2013-11-25 MED ORDER — INSULIN ASPART 100 UNIT/ML ~~LOC~~ SOLN: 0.0000 [IU] | Freq: Every day | SUBCUTANEOUS | Status: DC

## 2013-11-25 MED ORDER — CHLORHEXIDINE GLUCONATE 0.12 % MT SOLN
15.0000 mL | Freq: Two times a day (BID) | OROMUCOSAL | Status: DC
  Administered 2013-11-25 – 2013-11-30 (×8): 15 mL via OROMUCOSAL
  Filled 2013-11-25 (×13): qty 15

## 2013-11-25 MED ORDER — ASPIRIN 300 MG RE SUPP
300.0000 mg | RECTAL | Status: AC
  Administered 2013-11-25: 300 mg via RECTAL
  Filled 2013-11-25: qty 1

## 2013-11-25 MED ORDER — SODIUM POLYSTYRENE SULFONATE 15 GM/60ML PO SUSP
15.0000 g | Freq: Four times a day (QID) | ORAL | Status: DC
  Administered 2013-11-25 (×2): 15 g via ORAL
  Filled 2013-11-25 (×3): qty 60

## 2013-11-25 MED ORDER — LEVOFLOXACIN IN D5W 750 MG/150ML IV SOLN
750.0000 mg | Freq: Every day | INTRAVENOUS | Status: DC
  Filled 2013-11-25: qty 150

## 2013-11-25 MED ORDER — SODIUM CHLORIDE 0.45 % IV SOLN
INTRAVENOUS | Status: DC
  Administered 2013-11-25 – 2013-11-27 (×4): via INTRAVENOUS
  Filled 2013-11-25 (×8): qty 100

## 2013-11-25 MED ORDER — LEVOFLOXACIN IN D5W 750 MG/150ML IV SOLN
750.0000 mg | INTRAVENOUS | Status: DC
  Administered 2013-11-26: 750 mg via INTRAVENOUS
  Filled 2013-11-25 (×2): qty 150

## 2013-11-25 MED ORDER — HEPARIN SODIUM (PORCINE) 1000 UNIT/ML IJ SOLN
1000.0000 [IU] | Freq: Once | INTRAMUSCULAR | Status: AC
  Administered 2013-11-25: 1000 [IU] via INTRAVENOUS
  Filled 2013-11-25 (×2): qty 6

## 2013-11-25 MED ORDER — ASPIRIN 81 MG PO CHEW: 324.0000 mg | CHEWABLE_TABLET | ORAL | Status: AC

## 2013-11-25 MED ORDER — SODIUM CHLORIDE 0.9 % IV SOLN: 250.0000 mL | INTRAVENOUS | Status: DC | PRN

## 2013-11-25 MED ORDER — ALBUTEROL SULFATE (2.5 MG/3ML) 0.083% IN NEBU: 2.5000 mg | INHALATION_SOLUTION | RESPIRATORY_TRACT | Status: DC | PRN

## 2013-11-25 MED ORDER — INSULIN ASPART 100 UNIT/ML ~~LOC~~ SOLN
2.0000 [IU] | SUBCUTANEOUS | Status: DC
  Administered 2013-11-25 (×2): 6 [IU] via SUBCUTANEOUS

## 2013-11-25 MED ORDER — INSULIN GLARGINE 100 UNIT/ML ~~LOC~~ SOLN
35.0000 [IU] | Freq: Every day | SUBCUTANEOUS | Status: DC
  Administered 2013-11-25: 35 [IU] via SUBCUTANEOUS
  Filled 2013-11-25: qty 0.35

## 2013-11-25 MED ORDER — HEPARIN SODIUM (PORCINE) 5000 UNIT/ML IJ SOLN
5000.0000 [IU] | Freq: Three times a day (TID) | INTRAMUSCULAR | Status: DC
  Administered 2013-11-25 – 2013-11-30 (×16): 5000 [IU] via SUBCUTANEOUS
  Filled 2013-11-25 (×18): qty 1

## 2013-11-25 MED ORDER — SODIUM BICARBONATE 8.4 % IV SOLN
INTRAVENOUS | Status: AC
  Filled 2013-11-25: qty 150

## 2013-11-25 MED ORDER — SODIUM CHLORIDE 0.9 % IV SOLN
INTRAVENOUS | Status: DC
  Administered 2013-11-25: 17:00:00 via INTRAVENOUS
  Administered 2013-11-26: 4.3 [IU]/h via INTRAVENOUS
  Filled 2013-11-25 (×2): qty 1

## 2013-11-25 MED ORDER — DEXTROSE 5 % IV SOLN
1.0000 g | Freq: Three times a day (TID) | INTRAVENOUS | Status: DC
  Administered 2013-11-25 – 2013-11-30 (×15): 1 g via INTRAVENOUS
  Filled 2013-11-25 (×18): qty 1

## 2013-11-25 MED ORDER — VANCOMYCIN HCL 10 G IV SOLR
2000.0000 mg | INTRAVENOUS | Status: DC
  Administered 2013-11-25 – 2013-11-26 (×2): 2000 mg via INTRAVENOUS
  Filled 2013-11-25 (×3): qty 2000

## 2013-11-25 MED ORDER — ALBUTEROL SULFATE (2.5 MG/3ML) 0.083% IN NEBU
INHALATION_SOLUTION | RESPIRATORY_TRACT | Status: AC
  Administered 2013-11-25: 2.5 mg via RESPIRATORY_TRACT
  Filled 2013-11-25: qty 3

## 2013-11-25 NOTE — Progress Notes (Signed)
Spoke with nephrologist on call. Will give have dose lasix challenge. They will assess in AM. If no response to lasix will likely need HD.

## 2013-11-25 NOTE — Progress Notes (Signed)
INITIAL NUTRITION ASSESSMENT  DOCUMENTATION CODES Per approved criteria  -Morbid Obesity   INTERVENTION: Advance diet as medically appropriate  RD to follow for nutrition care plan, add interventions accordingly  NUTRITION DIAGNOSIS: Inadequate oral intake related to inability to eat as evidenced by NPO status  Goal: Pt to meet >/= 90% of their estimated nutrition needs   Monitor:  PO diet advancement vs need for TF, weight, labs, I/O's  Reason for Assessment: Malnutrition Screening Tool Report  63 y.o. male  Admitting Dx: Respiratory failure  ASSESSMENT: 33 M with COPD who presented to OSH with hypoxic respiratory failure and metabolic acidosis in the setting of AKI.  RD unable to obtain nutrition hx; patient currently on BiPAP; no family at bedside; per admission nutrition screen patient reported eating poorly because of a decreased appetite; weight down since September, however, desirable given morbid obesity -- RD to monitor PO diet advancement vs need for nutrition support.  RD unable to complete Nutrition Focused Physical Exam at this time.  Height: Ht Readings from Last 1 Encounters:  11/24/13 6\' 4"  (1.93 m)    Weight: Wt Readings from Last 1 Encounters:  11/25/13 331 lb 2.1 oz (150.2 kg)    Ideal Body Weight: 202 lb  % Ideal Body Weight: 163%  Wt Readings from Last 10 Encounters:  11/25/13 331 lb 2.1 oz (150.2 kg)  06/08/13 371 lb 9.6 oz (168.557 kg)  12/06/12 376 lb (170.552 kg)  05/04/12 373 lb 12.8 oz (169.555 kg)  10/23/11 377 lb 3.2 oz (171.097 kg)  05/14/11 379 lb (171.913 kg)  11/25/10 374 lb 9.6 oz (169.917 kg)  09/03/10 373 lb 12.8 oz (169.555 kg)  08/08/10 361 lb (163.749 kg)  04/08/10 357 lb 12 oz (162.274 kg)    Usual Body Weight: 371 lb -- November 2014  % Usual Body Weight: 89%  BMI:  Body mass index is 40.32 kg/(m^2).  Estimated Nutritional Needs: Kcal: 7782-4235 Protein: 135-145 gm Fluid: per MD  Skin: Intact  Diet Order:  Diabetic  EDUCATION NEEDS: -No education needs identified at this time   Intake/Output Summary (Last 24 hours) at 11/25/13 1045 Last data filed at 11/25/13 0600  Gross per 24 hour  Intake    986 ml  Output    195 ml  Net    791 ml    Labs:   Recent Labs Lab 11/25/13 0200 11/25/13 0340 11/25/13 0514  NA 129* 132* 133*  K 5.8* 6.0* 5.5*  CL 96 97 95*  CO2 11* 12* 12*  BUN 76* 80* 81*  CREATININE 5.07* 5.13* 5.26*  CALCIUM 8.3* 8.6 8.4  MG 2.1 2.2  --   PHOS 4.7* 5.1*  --   GLUCOSE 373* 405* 429*    CBG (last 3)   Recent Labs  11/24/13 2253 11/25/13 0359 11/25/13 0723  GLUCAP 256* 374* 403*    Scheduled Meds: . albuterol  2.5 mg Nebulization Q4H  . antiseptic oral rinse  15 mL Mouth Rinse q12n4p  . aztreonam  1 g Intravenous Once  . budesonide-formoterol  2 puff Inhalation BID  . chlorhexidine  15 mL Mouth Rinse BID  . gabapentin  300 mg Oral BID  . heparin  5,000 Units Subcutaneous 3 times per day  . insulin aspart  0-20 Units Subcutaneous TID WC  . insulin aspart  0-5 Units Subcutaneous QHS  . insulin glargine  35 Units Subcutaneous Daily  . [START ON 11/26/2013] levofloxacin (LEVAQUIN) IV  750 mg Intravenous Q48H  . sertraline  200 mg Oral Daily  . sodium polystyrene  15 g Oral Q6H    Continuous Infusions: .  sodium bicarbonate  infusion 1000 mL 100 mL/hr at 11/24/13 2300    Past Medical History  Diagnosis Date  . Diabetic neuropathy   . Respiratory infection   . Low back pain   . Asthma   . Dyspnea   . Sleep apnea   . Depression   . DM2 (diabetes mellitus, type 2)   . Hypercholesterolemia     Past Surgical History  Procedure Laterality Date  . Cholecystectomy    . Left eye enucleation following trauma      Arthur Holms, RD, LDN Pager #: [phone] After-Hours Pager #: [phone]

## 2013-11-25 NOTE — Progress Notes (Signed)
Name: Kenneth Patrick MRN: [id number] DOB: 11-07-1950    ADMISSION DATE:  11/24/2013 CONSULTATION DATE:  11/24/2013  REFERRING MD :  OSH PRIMARY SERVICE: PCCM  CHIEF COMPLAINT:  Shortness of breath  BRIEF PATIENT DESCRIPTION: 63 M with COPD who presented to OSH with hypoxic respiratory failure and metabolic acidosis in the setting of AKI. Has been feeling poorly over the past several weeks with increased productive cough and shortness of breath.  SIGNIFICANT EVENTS / STUDIES:  ABG at [facility] 7.14/32/54/10.9 on 2L --> 7.12/35/106/12.5 on BiPAP  BNP 684 at [facility] 5.03 2/21- remains acidotic 2/21 renal US>>>  LINES / TUBES: PIV 2/20  CULTURES: UCx 2/21>> BCx 2/21>> Viral panel 2/21>>  ANTIBIOTICS: Levaquin 2/20 >>  SUBJECTIVE: feeling slightly better. Off BiPAP.  VITAL SIGNS: Temp:  [97.8 F (36.6 C)-98.2 F (36.8 C)] 98.2 F (36.8 C) (02/21 0359) Pulse Rate:  [102-115] 104 (02/21 0630) Resp:  [14-30] 16 (02/21 0630) BP: (84-121)/(41-62) 106/52 mmHg (02/21 0630) SpO2:  [89 %-96 %] 91 % (02/21 0630) FiO2 (%):  [40 %] 40 % (02/21 0435) Weight:  [148.7 kg (327 lb 13.2 oz)-150.2 kg (331 lb 2.1 oz)] 150.2 kg (331 lb 2.1 oz) (02/21 0430) HEMODYNAMICS:   VENTILATOR SETTINGS: Vent Mode:  [-] BIPAP;PCV FiO2 (%):  [40 %] 40 % Set Rate:  [15 bmp] 15 bmp PEEP:  [5 cmH20] 5 cmH20 INTAKE / OUTPUT: Intake/Output     02/20 0701 - 02/21 0700   P.O. 60   I.V. (mL/kg) 860 (5.7)   IV Piggyback 66   Total Intake(mL/kg) 986 (6.6)   Urine (mL/kg/hr) 195   Total Output 195   Net +791         PHYSICAL EXAMINATION: General: Obese male in NAD Neuro: alert, appropriately answering questions  HEENT: [place] in place Cardiovascular: rrr, no murmur appreciated Lungs: Crackles bilaterally  Abdomen: Obese, NT +BS  Musculoskeletal: minimal edema in BLE  Skin: Intact   LABS:  CBC  Recent Labs Lab 11/25/13 0200 11/25/13 0340  WBC 16.0* 13.5*  HGB  9.5* 10.0*  HCT 29.4* 30.4*  PLT 309 349   Coag's  Recent Labs Lab 11/25/13 0200  INR 1.16   BMET  Recent Labs Lab 11/25/13 0200 11/25/13 0340  NA 129* 132*  K 5.8* 6.0*  CL 96 97  CO2 11* 12*  BUN 76* 80*  CREATININE 5.07* 5.13*  GLUCOSE 373* 405*   Electrolytes  Recent Labs Lab 11/25/13 0200 11/25/13 0340  CALCIUM 8.3* 8.6  MG 2.1 2.2  PHOS 4.7* 5.1*   Sepsis Markers  Recent Labs Lab 11/25/13 0200 11/25/13 0558  LATICACIDVEN 0.9 1.0  PROCALCITON 2.50  --    ABG  Recent Labs Lab 11/25/13 0114 11/25/13 0554  PHART 7.140* 7.168*  PCO2ART 37.0 34.9*  PO2ART 91.0 61.0*   Liver Enzymes  Recent Labs Lab 11/25/13 0200  AST 20  ALT 22  ALKPHOS 127*  BILITOT <0.2*  ALBUMIN 2.2*   Cardiac Enzymes  Recent Labs Lab 11/25/13 0200 11/25/13 0602  TROPONINI <0.30 <0.30  PROBNP 199.5*  --    Glucose  Recent Labs Lab 11/24/13 2253 11/25/13 0359  GLUCAP 256* 374*    Imaging Dg Chest Port 1 View  11/25/2013   CLINICAL DATA:  Hypoxia.  EXAM: PORTABLE CHEST - 1 VIEW  COMPARISON:  DG CHEST 1V PORT dated 11/24/2013; CT ANGIO CHEST dated 01/15/2012  FINDINGS: Mediastinum and hilar structures are normal. Patchy bilateral pulmonary infiltrates are again  noted. Heart size normal. No pleural effusion or pneumothorax. No acute bony abnormality.  IMPRESSION: Prominent patchy bilateral pulmonary infiltrates consistent with pneumonia. Close follow-up chest x-rays recommended to demonstrate complete clearing.   Electronically Signed   By: Marcello Moores  Register   On: 11/25/2013 00:43     CXR: bilateral infiltrates rt greater left , annular lesions rt?  ASSESSMENT / PLAN:  PULMONARY  A/P: Acute hypoxic respiratory failure OSA COPD PNA Supplemental O2 to maintain sat >= 92%  F/u chest CT - unsure of need with CXR showing PNA, cannon balls, lesions? Antibiotics per above Would continue BIPAP 4 on 4-6  Off, not cpap now with need improved ventilation Evaluate  for hemoptysis history  CARDIOVASCULAR  A/P:  Hypotension: Unclear baseline blood pressure but patient on several anti-hypertesnives at home. ? Sepsis or cardiac event. Initial troponin at OSH negative. Monitor BP  Low threshold for pressors  Serial Lactates - 1.0 Serial Troponins - negative Appears with edema, limiting volume for now Need cvp, echo assess volume status  RENAL  A/P:  AKI: Again, unclear etiology. ? Sepsis induced with PNA, pulm / renal syndrome? Metabolic acidosis anion gap: Likely 2/2 AKI.  Renal consult Urine electolytes  Renal US - f/u results asap, r/o stones hydro with rbc noted Serial BMPs  bicarb for K only , not for AG process Hold home metformin consider early assessment vasculitis work up, anca, ant gbm, dsdna, c3, c4, esr, ana, esnure done Repeat UA , re evaluate rbc, assess wbc casts Need cvp, likley to place hd catheter  GASTROINTESTINAL  A/P: No acute issues will advance diet - carb mod/renal diet ppi if shock noted or vent  HEMATOLOGIC  A/P: No acute issues monitor CBC NO LOVENOX Sub q hep coags wnl for line  INFECTIOUS  A/P: PNA Cont levaquin  Check Blood, Urine, Sputum cultures and RVP  ENDOCRINE  A/P: DM lantus addition SSI  cbgs  NEUROLOGIC  A/P: No acute issues Monitor off narcs Avoid benzo  Tommi Rumps, MD [place] PGY-2  TODAY'S SUMMARY: continue antibiotics, renal consult today, needs line, cvp, likely place HD catheter  I have personally obtained a history, examined the patient, evaluated laboratory and imaging results, formulated the assessment and plan and placed orders. Ccm time 30 min   Lavon Paganini. Titus Mould, MD, FACP Pgr: [place] [facility]  Pulmonary and [place] Pager: [phone]  11/25/2013, 6:53 AM

## 2013-11-25 NOTE — Procedures (Signed)
Hemodialysis Catheter Insertion Procedure Note - HD Kenneth Patrick [id number] Jul 09, 1951  Procedure: Insertion of Central Venous Catheter Indications: Assessment of intravascular volume and hemodialysis  Procedure Details Consent: Risks of procedure as well as the alternatives and risks of each were explained to the (patient/caregiver).  Consent for procedure obtained. Time Out: Verified patient identification, verified procedure, site/side was marked, verified correct patient position, special equipment/implants available, medications/allergies/relevent history reviewed, required imaging and test results available.  Performed  Maximum sterile technique was used including antiseptics, cap, gloves, gown, hand hygiene, mask and sheet. Skin prep: Chlorhexidine; local anesthetic administered A  triple lumen catheter was placed in the right internal jugular vein using the Seldinger technique.  Evaluation Blood flow good Complications: No apparent complications Patient did tolerate procedure well. Chest X-ray ordered to verify placement.  CXR: pending.  Tommi Rumps 11/25/2013, 12:22 PM  [place]  Daniel J. Titus Mould, MD, [place] Pgr: [place] [facility]

## 2013-11-25 NOTE — Progress Notes (Signed)
[place] Progress Note Patient Name: Fatih Stalvey DOB: 11-Mar-1951 MRN: [id number]  Date of Service  11/25/2013   HPI/Events of Note  Acute renal failure and hyperkalemia.  5.8.   eICU Interventions  Kayexalate ordered for 3 doses.   Intervention Category Minor Interventions: Electrolytes abnormality - evaluation and management  Mauri Brooklyn, P 11/25/2013, 4:09 AM

## 2013-11-25 NOTE — Progress Notes (Addendum)
ANTIBIOTIC CONSULT NOTE - INITIAL  Pharmacy Consult for levofloxacin, aztreonam Indication: pneumonia  Allergies  Allergen Reactions  . Penicillins     Patient Measurements: Height: 6\' 4"  (193 cm) Weight: 331 lb 2.1 oz (150.2 kg) IBW/kg (Calculated) : 86.8   Vital Signs: Temp: 97.9 F (36.6 C) (02/21 1149) Temp src: Oral (02/21 1149) BP: 110/47 mmHg (02/21 0907) Pulse Rate: 104 (02/21 0700) Intake/Output from previous day: 02/20 0701 - 02/21 0700 In: 986 [P.O.:60; I.V.:860; IV Piggyback:66] Out: 195 [Urine:195] Intake/Output from this shift:    Labs:  Recent Labs  11/25/13 0200 11/25/13 0218 11/25/13 0340 11/25/13 0514  WBC 16.0*  --  13.5*  --   HGB 9.5*  --  10.0*  --   PLT 309  --  349  --   LABCREA  --  235.69  --   --   CREATININE 5.07*  --  5.13* 5.26*   Estimated Creatinine Clearance: 23.1 ml/min (by C-G formula based on Cr of 5.26). No results found for this basename: VANCOTROUGH, Corlis Leak, VANCORANDOM, [place], GENTPEAK, GENTRANDOM, TOBRATROUGH, TOBRAPEAK, TOBRARND, AMIKACINPEAK, AMIKACINTROU, AMIKACIN,  in the last 72 hours   Microbiology: Recent Results (from the past 720 hour(s))  MRSA PCR SCREENING     Status: None   Collection Time    11/24/13 11:42 PM      Result Value Ref Range Status   MRSA by PCR NEGATIVE  NEGATIVE Final   Comment:            The GeneXpert MRSA Assay (FDA     approved for NASAL specimens     only), is one component of a     comprehensive MRSA colonization     surveillance program. It is not     intended to diagnose MRSA     infection nor to guide or     monitor treatment for     MRSA infections.    Medical History: Past Medical History  Diagnosis Date  . Diabetic neuropathy   . Respiratory infection   . Low back pain   . Asthma   . Dyspnea   . Sleep apnea   . Depression   . DM2 (diabetes mellitus, type 2)   . Hypercholesterolemia     Assessment: Kenneth Patrick transferred from [facility] with respiratory  failure and metabolic acidosis with AKI. SCr 5.26 with est CrCl ~53mL/min. WBC 13.5, currently afebrile. Was started on levofloxacin at [facility], continuing here. Starting Aztreonam today.  Goal of Therapy:  Eradication of infection  Plan:  1. Continue levofloxacin 750mg  IV q48h with next dose tonight 2. Aztreonam 1g IV q8h 3. Follow renal function, HD needs, clinical progression, c/s, LOT  Lauren D. Bajbus, PharmD, BCPS Clinical Pharmacist Pager: [phone] 11/25/2013 1:13 PM  Adding IV vanc this PM  Plan  Vanc 2g IV q24 Trough at steady state  Onnie Boer, PharmD Pager: [phone] 11/25/2013 4:46 PM

## 2013-11-25 NOTE — Consult Note (Signed)
Reason for Consult:AKI, hyperkalemia Referring Physician: Titus Mould, MD  Kenneth Patrick is an 63 y.o. male.  HPI: Pt is a 63yo M with PMH sig for COPD, DM, HTN, who presented to [facility] hospital with worsening SOB and was transferred to [facility] due to hypoxic respiratory failure and metabolic acidosis in the setting of AKI. Unknown baseline Scr, however the patient had been taking NSAIDs (12 ibuprofen's daily for months) along with ACE-I and metformin PTA.  He does not know what his baseline Scr has been, nor does he recall being told by his PCP that he has any kidney issues related to his DM and HTN.  We were asked to see the patient to help further evaluate and manage his AKI and metabolic derangements.    Trend in Creatinine: Creatinine, Ser  Date/Time Value Ref Range Status  11/25/2013  5:14 AM 5.26* 0.50 - 1.35 mg/dL Final  11/25/2013  3:40 AM 5.13* 0.50 - 1.35 mg/dL Final  11/25/2013  2:00 AM 5.07* 0.50 - 1.35 mg/dL Final    PMH:   Past Medical History  Diagnosis Date  . Diabetic neuropathy   . Respiratory infection   . Low back pain   . Asthma   . Dyspnea   . Sleep apnea   . Depression   . DM2 (diabetes mellitus, type 2)   . Hypercholesterolemia     PSH:   Past Surgical History  Procedure Laterality Date  . Cholecystectomy    . Left eye enucleation following trauma      Allergies:  Allergies  Allergen Reactions  . Penicillins     Medications:   Prior to Admission medications   Medication Sig Start Date End Date Taking? Authorizing Provider  albuterol (VENTOLIN HFA) 108 (90 BASE) MCG/ACT inhaler Inhale 2 puffs into the lungs every 6 (six) hours as needed.      Historical Provider, MD  atorvastatin (LIPITOR) 80 MG tablet Take 1 tablet by mouth At bedtime. 05/01/11   Historical Provider, MD  Fluticasone Furoate-Vilanterol (BREO ELLIPTA) 100-25 MCG/INH AEPB Inhale 1 puff into the lungs daily. 06/08/13   Tammy S Parrett, NP  furosemide (LASIX) 40 MG tablet Take 40 mg by mouth  daily as needed.      Historical Provider, MD  gabapentin (NEURONTIN) 300 MG capsule Take 300 mg by mouth 2 (two) times daily.      Historical Provider, MD  ibuprofen (ADVIL,MOTRIN) 200 MG tablet as needed.      Historical Provider, MD  insulin aspart (NOVOLOG FLEXPEN) 100 UNIT/ML injection 20 units every morning and 24 units every evening     Historical Provider, MD  insulin glargine (LANTUS) 100 UNIT/ML injection Inject 70 Units into the skin 2 (two) times daily.      Historical Provider, MD  INVOKANA 300 MG TABS Take 1 tablet by mouth daily. 05/22/13   Historical Provider, MD  LEVEMIR 100 UNIT/ML injection Use as directed (will replace Lantus) 05/15/13   Historical Provider, MD  lisinopril (PRINIVIL,ZESTRIL) 10 MG tablet Once a day 11/30/12   Historical Provider, MD  metFORMIN (GLUCOPHAGE) 500 MG tablet Take 1,000 mg by mouth 2 (two) times daily with a meal.      Historical Provider, MD  NOVOFINE 32G X 6 MM MISC Use as directed 05/12/11   Historical Provider, MD  oxymetazoline (AFRIN) 0.05 % nasal spray Place 1-2 sprays into the nose 2 (two) times daily.      Historical Provider, MD  potassium chloride (K-DUR,KLOR-CON) 10 MEQ tablet Take by mouth  daily. When taking furosemide     Historical Provider, MD  Potassium Gluconate 550 MG TABS Take 2 tablets by mouth at bedtime.      Historical Provider, MD  sertraline (ZOLOFT) 100 MG tablet Take 200 mg by mouth daily.      Historical Provider, MD  VICTOZA 18 MG/3ML SOLN daily. 05/12/11   Historical Provider, MD    Inpatient medications: . albuterol  2.5 mg Nebulization Q4H  . antiseptic oral rinse  15 mL Mouth Rinse q12n4p  . budesonide-formoterol  2 puff Inhalation BID  . chlorhexidine  15 mL Mouth Rinse BID  . gabapentin  300 mg Oral BID  . heparin  5,000 Units Subcutaneous 3 times per day  . insulin aspart  0-20 Units Subcutaneous TID WC  . insulin aspart  0-5 Units Subcutaneous QHS  . insulin glargine  35 Units Subcutaneous Daily  . [START ON  11/26/2013] levofloxacin (LEVAQUIN) IV  750 mg Intravenous Q48H  . sertraline  200 mg Oral Daily  . sodium polystyrene  15 g Oral Q6H    Discontinued Meds:   Medications Discontinued During This Encounter  Medication Reason  . levofloxacin (LEVAQUIN) IVPB 750 mg   . Fluticasone Furoate-Vilanterol 100-25 MCG/INH AEPB 1 puff   . insulin glargine (LANTUS) injection 75 Units   . insulin aspart (novoLOG) injection 2-6 Units     Social History:  reports that he quit smoking about 9 years ago. He has never used smokeless tobacco. He reports that he does not drink alcohol or use illicit drugs.  Family History:   Family History  Problem Relation Age of Onset  . Leukemia Father   . Diabetes Mother     A comprehensive review of systems was negative except for: Constitutional: positive for fatigue and malaise Respiratory: positive for cough, dyspnea on exertion and sputum Cardiovascular: positive for fatigue and lower extremity edema Genitourinary: positive for foamy urine over the last several weeks Weight change:   Intake/Output Summary (Last 24 hours) at 11/25/13 0902 Last data filed at 11/25/13 0600  Gross per 24 hour  Intake    986 ml  Output    195 ml  Net    791 ml   BP 107/55  Pulse 104  Temp(Src) 97.5 F (36.4 C) (Oral)  Resp 17  Ht 6\' 4"  (1.93 m)  Wt 150.2 kg (331 lb 2.1 oz)  BMI 40.32 kg/m2  SpO2 92% Filed Vitals:   11/25/13 0630 11/25/13 0700 11/25/13 0740 11/25/13 0851  BP: 106/52 107/55    Pulse: 104 104    Temp:   97.5 F (36.4 C)   TempSrc:   Oral   Resp: 16 17    Height:      Weight:      SpO2: 91% 92%  92%     General appearance: cooperative, fatigued and wearing BiPap Head: Normocephalic, without obvious abnormality, atraumatic Neck: no adenopathy, no carotid bruit, no JVD, supple, symmetrical, trachea midline and thyroid not enlarged, symmetric, no tenderness/mass/nodules Resp: diminished BS throughout lung fields Cardio: tachycardic, no rub GI:  obese, +BS, soft, NT Extremities: edema tr pretib edema Neurologic: Grossly normal  Labs: Basic Metabolic Panel:  Recent Labs Lab 11/25/13 0200 11/25/13 0340 11/25/13 0514  NA 129* 132* 133*  K 5.8* 6.0* 5.5*  CL 96 97 95*  CO2 11* 12* 12*  GLUCOSE 373* 405* 429*  BUN 76* 80* 81*  CREATININE 5.07* 5.13* 5.26*  ALBUMIN 2.2*  --   --   CALCIUM  8.3* 8.6 8.4  PHOS 4.7* 5.1*  --    Liver Function Tests:  Recent Labs Lab 11/25/13 0200  AST 20  ALT 22  ALKPHOS 127*  BILITOT <0.2*  PROT 7.0  ALBUMIN 2.2*   No results found for this basename: LIPASE, AMYLASE,  in the last 168 hours No results found for this basename: AMMONIA,  in the last 168 hours CBC:  Recent Labs Lab 11/25/13 0200 11/25/13 0340  WBC 16.0* 13.5*  NEUTROABS 14.8*  --   HGB 9.5* 10.0*  HCT 29.4* 30.4*  MCV 88.3 86.9  PLT 309 349   PT/INR: @LABRCNTIP (inr:5) Cardiac Enzymes: ) Recent Labs Lab 11/25/13 0200 11/25/13 0602  TROPONINI <0.30 <0.30   CBG:  Recent Labs Lab 11/24/13 2253 11/25/13 0359 11/25/13 0723  GLUCAP 256* 374* 403*    Iron Studies: No results found for this basename: IRON, TIBC, TRANSFERRIN, FERRITIN,  in the last 168 hours  Xrays/Other Studies: Dg Chest Port 1 View  11/25/2013   CLINICAL DATA:  Hypoxia.  EXAM: PORTABLE CHEST - 1 VIEW  COMPARISON:  DG CHEST 1V PORT dated 11/24/2013; CT ANGIO CHEST dated 01/15/2012  FINDINGS: Mediastinum and hilar structures are normal. Patchy bilateral pulmonary infiltrates are again noted. Heart size normal. No pleural effusion or pneumothorax. No acute bony abnormality.  IMPRESSION: Prominent patchy bilateral pulmonary infiltrates consistent with pneumonia. Close follow-up chest x-rays recommended to demonstrate complete clearing.   Electronically Signed   By: Marcello Moores  Register   On: 11/25/2013 00:43     Assessment/Plan: 1.  AKI- likely due to ischemic ATN in setting of SIRS with concomitant use of ACE-I, NSAIDs and metformin.    1. Cont to hold ACE and NSAIDs 2. Agree with pulm-renal syndrome w/u but most likely hemodynamically mediated with above agents and acute illness 3. UOP increased to 750 this am, will follow closely but may need HD for acidosis given his COPD.  Will follow 4. Renal US pending 2. Metabolic acidosis- as above: AKI and metformin 1. Continue with IV Bicarb and follow for need of HD 3. Hypoxic resp failure/CAP 1. Per PCCM 4. SIRS- per PCCM 5. COPD- per PCCM 6. DM- hyperglycemia- would change bicarb to isotonic bicarb without D5 7. OSA- on BiPap 8. Anemia- ?acute or chronic, will need outside labs 1. Check SPEP/UPEP, iron studies 9. Protein malnutrition- will evaluate for possible nephrotic syndrome   Suren Payne A 11/25/2013, 9:02 AM

## 2013-11-26 DIAGNOSIS — I519 Heart disease, unspecified: Secondary | ICD-10-CM

## 2013-11-26 DIAGNOSIS — R6510 Systemic inflammatory response syndrome (SIRS) of non-infectious origin without acute organ dysfunction: Secondary | ICD-10-CM

## 2013-11-26 LAB — BASIC METABOLIC PANEL
BUN: 82 mg/dL — ABNORMAL HIGH (ref 6–23)
BUN: 84 mg/dL — ABNORMAL HIGH (ref 6–23)
BUN: 86 mg/dL — ABNORMAL HIGH (ref 6–23)
BUN: 86 mg/dL — ABNORMAL HIGH (ref 6–23)
CALCIUM: 7.4 mg/dL — AB (ref 8.4–10.5)
CHLORIDE: 95 meq/L — AB (ref 96–112)
CO2: 19 mEq/L (ref 19–32)
CO2: 20 meq/L (ref 19–32)
CO2: 18 meq/L — AB (ref 19–32)
CO2: 18 mEq/L — ABNORMAL LOW (ref 19–32)
CREATININE: 4.72 mg/dL — AB (ref 0.50–1.35)
CREATININE: 4.21 mg/dL — AB (ref 0.50–1.35)
CREATININE: 4.53 mg/dL — AB (ref 0.50–1.35)
Calcium: 7.1 mg/dL — ABNORMAL LOW (ref 8.4–10.5)
Calcium: 7.4 mg/dL — ABNORMAL LOW (ref 8.4–10.5)
Calcium: 7.7 mg/dL — ABNORMAL LOW (ref 8.4–10.5)
Chloride: 97 mEq/L (ref 96–112)
Chloride: 99 mEq/L (ref 96–112)
Chloride: 97 mEq/L (ref 96–112)
Creatinine, Ser: 3.88 mg/dL — ABNORMAL HIGH (ref 0.50–1.35)
GFR calc Af Amer: 14 mL/min — ABNORMAL LOW (ref >90)
GFR calc Af Amer: 16 mL/min — ABNORMAL LOW (ref >90)
GFR calc Af Amer: 18 mL/min — ABNORMAL LOW (ref >90)
GFR calc non Af Amer: 12 mL/min — ABNORMAL LOW (ref >90)
GFR calc non Af Amer: 13 mL/min — ABNORMAL LOW (ref >90)
GFR calc non Af Amer: 14 mL/min — ABNORMAL LOW (ref >90)
GFR calc non Af Amer: 15 mL/min — ABNORMAL LOW (ref >90)
GFR, EST AFRICAN AMERICAN: 15 mL/min — AB (ref >90)
GLUCOSE: 171 mg/dL — AB (ref 70–99)
GLUCOSE: 175 mg/dL — AB (ref 70–99)
Glucose, Bld: 141 mg/dL — ABNORMAL HIGH (ref 70–99)
Glucose, Bld: 205 mg/dL — ABNORMAL HIGH (ref 70–99)
POTASSIUM: 3.5 meq/L — AB (ref 3.7–5.3)
Potassium: 3.5 mEq/L — ABNORMAL LOW (ref 3.7–5.3)
Potassium: 3.5 mEq/L — ABNORMAL LOW (ref 3.7–5.3)
Potassium: 3.8 mEq/L (ref 3.7–5.3)
SODIUM: 139 meq/L (ref 137–147)
SODIUM: 138 meq/L (ref 137–147)
Sodium: 134 mEq/L — ABNORMAL LOW (ref 137–147)
Sodium: 136 mEq/L — ABNORMAL LOW (ref 137–147)

## 2013-11-26 LAB — CBC
HEMATOCRIT: 26.7 % — AB (ref 39.0–52.0)
Hemoglobin: 9.1 g/dL — ABNORMAL LOW (ref 13.0–17.0)
MCH: 28.9 pg (ref 26.0–34.0)
MCHC: 34.1 g/dL (ref 30.0–36.0)
MCV: 84.8 fL (ref 78.0–100.0)
Platelets: 307 10*3/uL (ref 150–400)
RBC: 3.15 MIL/uL — ABNORMAL LOW (ref 4.22–5.81)
RDW: 16 % — AB (ref 11.5–15.5)
WBC: 10.7 10*3/uL — ABNORMAL HIGH (ref 4.0–10.5)

## 2013-11-26 LAB — LEGIONELLA ANTIGEN, URINE: LEGIONELLA ANTIGEN, URINE: NEGATIVE

## 2013-11-26 LAB — GLUCOSE, CAPILLARY
GLUCOSE-CAPILLARY: 126 mg/dL — AB (ref 70–99)
GLUCOSE-CAPILLARY: 146 mg/dL — AB (ref 70–99)
GLUCOSE-CAPILLARY: 148 mg/dL — AB (ref 70–99)
GLUCOSE-CAPILLARY: 142 mg/dL — AB (ref 70–99)
GLUCOSE-CAPILLARY: 227 mg/dL — AB (ref 70–99)
Glucose-Capillary: 141 mg/dL — ABNORMAL HIGH (ref 70–99)
Glucose-Capillary: 140 mg/dL — ABNORMAL HIGH (ref 70–99)
Glucose-Capillary: 131 mg/dL — ABNORMAL HIGH (ref 70–99)
Glucose-Capillary: 146 mg/dL — ABNORMAL HIGH (ref 70–99)
Glucose-Capillary: 138 mg/dL — ABNORMAL HIGH (ref 70–99)
Glucose-Capillary: 135 mg/dL — ABNORMAL HIGH (ref 70–99)
Glucose-Capillary: 160 mg/dL — ABNORMAL HIGH (ref 70–99)
Glucose-Capillary: 184 mg/dL — ABNORMAL HIGH (ref 70–99)

## 2013-11-26 LAB — URINE CULTURE
Colony Count: NO GROWTH
Culture: NO GROWTH

## 2013-11-26 LAB — VITAMIN B12: Vitamin B-12: 374 pg/mL (ref 211–911)

## 2013-11-26 LAB — HAPTOGLOBIN: HAPTOGLOBIN: 521 mg/dL — AB (ref 45–215)

## 2013-11-26 LAB — FERRITIN: FERRITIN: 373 ng/mL — AB (ref 22–322)

## 2013-11-26 LAB — PROTEIN, URINE, 24 HOUR
Collection Interval-UPROT: 24 hours
PROTEIN 24H UR: 1050 mg/d — AB (ref 50–100)
Protein, Urine: 30 mg/dL
Urine Total Volume-UPROT: 3500 mL

## 2013-11-26 LAB — HEPATITIS PANEL, ACUTE
HCV Ab: NEGATIVE
HEP A IGM: NONREACTIVE
HEP B C IGM: NONREACTIVE
Hepatitis B Surface Ag: NEGATIVE

## 2013-11-26 LAB — FOLATE RBC: RBC Folate: 651 ng/mL — ABNORMAL HIGH (ref >280)

## 2013-11-26 LAB — HIV ANTIBODY (ROUTINE TESTING W REFLEX): HIV: NONREACTIVE

## 2013-11-26 MED ORDER — DEXTROSE 10 % IV SOLN: INTRAVENOUS | Status: DC | PRN

## 2013-11-26 MED ORDER — PNEUMOCOCCAL VAC POLYVALENT 25 MCG/0.5ML IJ INJ
0.5000 mL | INJECTION | INTRAMUSCULAR | Status: AC
  Administered 2013-11-27: 0.5 mL via INTRAMUSCULAR
  Filled 2013-11-26: qty 0.5

## 2013-11-26 MED ORDER — POTASSIUM CHLORIDE 10 MEQ/100ML IV SOLN
10.0000 meq | INTRAVENOUS | Status: AC
  Administered 2013-11-26: 10 meq via INTRAVENOUS

## 2013-11-26 MED ORDER — INSULIN ASPART 100 UNIT/ML ~~LOC~~ SOLN
0.0000 [IU] | SUBCUTANEOUS | Status: DC
  Administered 2013-11-26: 5 [IU] via SUBCUTANEOUS
  Administered 2013-11-26 – 2013-11-27 (×5): 3 [IU] via SUBCUTANEOUS

## 2013-11-26 MED ORDER — INSULIN GLARGINE 100 UNIT/ML ~~LOC~~ SOLN
35.0000 [IU] | SUBCUTANEOUS | Status: DC
  Administered 2013-11-26 – 2013-11-27 (×2): 35 [IU] via SUBCUTANEOUS
  Filled 2013-11-26 (×3): qty 0.35

## 2013-11-26 MED ORDER — POTASSIUM CHLORIDE 10 MEQ/100ML IV SOLN
10.0000 meq | INTRAVENOUS | Status: AC
  Administered 2013-11-26 (×4): 10 meq via INTRAVENOUS
  Filled 2013-11-26 (×4): qty 100

## 2013-11-26 MED ORDER — INSULIN ASPART 100 UNIT/ML ~~LOC~~ SOLN: 1.0000 [IU] | SUBCUTANEOUS | Status: DC

## 2013-11-26 MED ORDER — POTASSIUM CHLORIDE 10 MEQ/100ML IV SOLN
10.0000 meq | INTRAVENOUS | Status: AC
Start: 2013-11-26 — End: 2013-11-26
  Administered 2013-11-26 (×3): 10 meq via INTRAVENOUS
  Filled 2013-11-26 (×3): qty 100

## 2013-11-26 MED ORDER — POTASSIUM CHLORIDE 10 MEQ/100ML IV SOLN
10.0000 meq | INTRAVENOUS | Status: DC
  Administered 2013-11-26 (×3): 10 meq via INTRAVENOUS
  Filled 2013-11-26 (×3): qty 100

## 2013-11-26 NOTE — Progress Notes (Signed)
[place] Progress Note Patient Name: Kenneth Patrick DOB: March 31, 1951 MRN: [id number]  Date of Service  11/26/2013   HPI/Events of Note   hypokalemia  eICU Interventions  electrolytes replaced.     Intervention Category Minor Interventions: Electrolytes abnormality - evaluation and management  Mauri Brooklyn, P 11/26/2013, 7:25 PM

## 2013-11-26 NOTE — Progress Notes (Signed)
Echocardiogram 2D Echocardiogram has been performed.  Kenneth Patrick 11/26/2013, 10:15 AM

## 2013-11-26 NOTE — Progress Notes (Signed)
Name: Kenneth Patrick MRN: [id number] DOB: 07/14/1951    ADMISSION DATE:  11/24/2013 CONSULTATION DATE:  11/24/2013  REFERRING MD :  [facility] PRIMARY SERVICE: PCCM  CHIEF COMPLAINT:  Shortness of breath  BRIEF PATIENT DESCRIPTION: 78 M with COPD who presented to OSH with hypoxic respiratory failure and metabolic acidosis in the setting of AKI. Has been feeling poorly over the past several weeks with increased productive cough and shortness of breath.  SIGNIFICANT EVENTS / STUDIES:  ABG at [facility] 7.14/32/54/10.9 on 2L --> 7.12/35/106/12.5 on BiPAP  BNP 684 at [facility] 5.03 2/21- remains acidotic 2/21 renal US>>>no hydronephrosis 2/21 CT chest>>>bilateral lower lobe bronchiectasis, multiple tree in bud nodules in right lung compatible with infectious or inflammatory process, potential septic emboli 2/21 acidosis improved on bicarb and with bipap 2/22- no distress, no fevers, bipap off, no hd required 2/22 echo>>>  LINES / TUBES: PIV 2/20>> Right HD cath 2/21>>  CULTURES: UCx 2/21>> BCx 2/21>> Viral panel 2/21>>  ANTIBIOTICS: Levaquin 2/20 >> Vanc 2/21>> Aztreonam 2/2>>  SUBJECTIVE: cbgs elevated. Placed on insulin drip. Breathing well overnight.  VITAL SIGNS: Temp:  [97.4 F (36.3 C)-98 F (36.7 C)] 97.8 F (36.6 C) (02/22 0411) Pulse Rate:  [88-106] 91 (02/22 0500) Resp:  [14-21] 17 (02/22 0500) BP: (98-124)/(47-64) 101/52 mmHg (02/22 0500) SpO2:  [89 %-97 %] 93 % (02/22 0500) FiO2 (%):  [40 %] 40 % (02/21 1139) Weight:  [150 kg (330 lb 11 oz)] 150 kg (330 lb 11 oz) (02/22 0400) HEMODYNAMICS: CVP:  [7 mmHg-12 mmHg] 7 mmHg VENTILATOR SETTINGS: Vent Mode:  [-]  FiO2 (%):  [40 %] 40 % INTAKE / OUTPUT: Intake/Output     02/21 0701 - 02/22 0700   P.O. 300   I.V. (mL/kg) 2671.7 (17.8)   IV Piggyback 1000   Total Intake(mL/kg) 3971.7 (26.5)   Urine (mL/kg/hr) 3375 (0.9)   Total Output 3375   Net +596.7         PHYSICAL  EXAMINATION: General: Obese male in NAD Neuro: alert, appropriately answering questions  HEENT: Powers in place Cardiovascular: rrr, no murmur appreciated Lungs: Crackles bilaterally improved Abdomen: Obese, NT +BS  Musculoskeletal: minimal edema in BLE  Skin: Intact   LABS:  CBC  Recent Labs Lab 11/25/13 0200 11/25/13 0340 11/26/13 0520  WBC 16.0* 13.5* 10.7*  HGB 9.5* 10.0* 9.1*  HCT 29.4* 30.4* 26.7*  PLT 309 349 307   Coag's  Recent Labs Lab 11/25/13 0200  INR 1.16   BMET  Recent Labs Lab 11/25/13 1800 11/26/13 0001 11/26/13 0520  NA 133* 134* 136*  K 3.8 3.5* 3.5*  CL 94* 95* 97  CO2 18* 18* 19  BUN 88* 86* 86*  CREATININE 4.95* 4.72* 4.53*  GLUCOSE 354* 171* 141*   Electrolytes  Recent Labs Lab 11/25/13 0200 11/25/13 0340  11/25/13 1800 11/26/13 0001 11/26/13 0520  CALCIUM 8.3* 8.6  < > 8.0* 7.7* 7.4*  MG 2.1 2.2  --   --   --   --   PHOS 4.7* 5.1*  --   --   --   --   < > = values in this interval not displayed. Sepsis Markers  Recent Labs Lab 11/25/13 0200 11/25/13 0558 11/25/13 1224  LATICACIDVEN 0.9 1.0 1.1  PROCALCITON 2.50  --   --    ABG  Recent Labs Lab 11/25/13 0114 11/25/13 0554 11/25/13 1712  PHART 7.140* 7.168* 7.305*  PCO2ART 37.0 34.9* 32.3*  PO2ART 91.0 61.0*  75.0*   Liver Enzymes  Recent Labs Lab 11/25/13 0200  AST 20  ALT 22  ALKPHOS 127*  BILITOT <0.2*  ALBUMIN 2.2*   Cardiac Enzymes  Recent Labs Lab 11/25/13 0200 11/25/13 0602 11/25/13 1224  TROPONINI <0.30 <0.30 <0.30  PROBNP 199.5*  --   --    Glucose  Recent Labs Lab 11/25/13 2259 11/25/13 2354 11/26/13 0103 11/26/13 0157 11/26/13 0307 11/26/13 0401  GLUCAP 200* 170* 141* 140* 126* 131*    Imaging Ct Chest Wo Contrast  11/25/2013   CLINICAL DATA:  Worsening shortness of breath.  Respiratory failure.  EXAM: CT CHEST WITHOUT CONTRAST  TECHNIQUE: Multidetector CT imaging of the chest was performed following the standard protocol  without IV contrast.  COMPARISON:  01/15/2012  FINDINGS: No pleural effusion identified. Airspace consolidation and atelectasis is noted within the posterior right lower lobe.  There are multiple tree-in-bud nodules identified throughout the right upper lobe, right lower lobe and right middle lobe. Solid nodule with surrounding ground-glass attenuation is identified in the right upper lobe measuring 1.8 cm, image 20/series 4. Similarly there is a left lower lobe, solid nodule measuring 1.5 cm, image 38/series 4. Bronchiectasis is identified within both lower lobes, left greater than right. A few peripheral nodules may have central cavitation. For example, within the periphery of the right middle lobe there is a 1.6 cm nodule, image 28/ series 4. Within the left lower lobe there is a 1.2 cm cavitary nodule.  The trachea appears patent and is midline. The heart size appears normal. Calcified stress set partially calcified right paratracheal and sub- carinal lymph nodes are identified compatible with prior granulomatous disease. Noncalcified right paratracheal lymph node measures 8 mm, image 13/series 3. Noncalcified sub- carinal lymph node is enlarged measuring 1.1 cm. Calcified atherosclerotic disease also involves the thoracic aorta as well as the LAD and RCA coronary arteries.  No axillary or supraclavicular adenopathy. Incidental imaging through the upper abdomen is unremarkable.  Review of the visualized bony structures is on unremarkable.  IMPRESSION: 1. Multiple abnormal findings are identified in both lungs which are likely inflammatory or infectious in etiology. 2. Bilateral lower lobe bronchiectasis left-greater-than-right is favored to be the sequela of chronic aspiration. 3. Multiple tree in bud nodules are scattered throughout the right lung compatible with inflammatory or infectious bronchiolitis. 4. Multi focal nodules with either central cavitation and or peripheral ground-glass attenuation are noted.  Differential considerations include septic emboli as well as fungal infection. 5. A few calcified mediastinal lymph nodes are identified compatible with prior granulomatous disease. 6. Atherosclerotic disease including coronary artery calcifications.   Electronically Signed   By: Kerby Moors M.D.   On: 11/25/2013 16:11   US Renal  11/25/2013   CLINICAL DATA:  Elevated creatinine, diabetes  EXAM: RENAL/URINARY TRACT ULTRASOUND COMPLETE  COMPARISON:  None.  FINDINGS: Right Kidney:  Length: 14.1 cm. Echogenicity within normal limits. No mass or hydronephrosis visualized.  Left Kidney:  Length: 13.1 cm. Echogenicity within normal limits. No mass or hydronephrosis visualized.  Bladder:  Decompressed by Foley catheter  IMPRESSION: No acute finding by ultrasound.  Negative for hydronephrosis.   Electronically Signed   By: Daryll Brod M.D.   On: 11/25/2013 09:13   Dg Chest Port 1 View  11/25/2013   CLINICAL DATA:  Right jugular dialysis catheter placement  EXAM: PORTABLE CHEST - 1 VIEW  COMPARISON:  Portable exam 1238 hr compared to 11/25/2013 at 0021 hr  FINDINGS: New right jugular central venous catheter  with tip projecting over mid SVC.  Normal heart size and mediastinal contours.  Pulmonary vascular congestion.  Improved interstitial infiltrates since previous exam.  Bibasilar atelectasis.  No definite pleural effusion or pneumothorax.  IMPRESSION: Bibasilar atelectasis.  No pneumothorax following right jugular line placement.  The rapid improvement an infiltrates since the earlier study of 11/25/2013 suggests improved edema.   Electronically Signed   By: Lavonia Dana M.D.   On: 11/25/2013 13:12   Dg Chest Port 1 View  11/25/2013   CLINICAL DATA:  Hypoxia.  EXAM: PORTABLE CHEST - 1 VIEW  COMPARISON:  DG CHEST 1V PORT dated 11/24/2013; CT ANGIO CHEST dated 01/15/2012  FINDINGS: Mediastinum and hilar structures are normal. Patchy bilateral pulmonary infiltrates are again noted. Heart size normal. No pleural  effusion or pneumothorax. No acute bony abnormality.  IMPRESSION: Prominent patchy bilateral pulmonary infiltrates consistent with pneumonia. Close follow-up chest x-rays recommended to demonstrate complete clearing.   Electronically Signed   By: Marcello Moores  Register   On: 11/25/2013 00:43    ASSESSMENT / PLAN:  PULMONARY  A/P: Acute hypoxic respiratory failure OSA COPD PNA Supplemental O2 to maintain sat >= 92%, BiPAP prn  Chest CT with ?septic emboli / fungal? / PNA/ vasculitis - obtain echo Follow auto immune work up results to final Hold empiric roids Antibiotics per above pcxr in am   CARDIOVASCULAR  A/P: hypovolemia likely Hypotension: ?related to septic emboli Monitor BP  Low threshold for pressors - not needed currently CVP 7  echo assess volume status Increase maintenance fluids rate  RENAL  A/P:  AKI: likely related to sepsis in combination with ACE/ibuproen/metformin use Metabolic acidosis anion gap: Likely 2/2 AKI.  Elevated ESR FeNa 0.5% would argue for prerenal Hypokalemia Renal consult - appreciate their assistance Renal US - normal BMETs q12h Dc bicarb for K, however, consider continue as bicarb rising with this approach Hold home metformin F/u vasculitis work-up Repeat UA with granular casts Replete K conservative Allow pos balance  GASTROINTESTINAL  A/P: No acute issues carb mod/renal diet ppi if shock noted or vent  HEMATOLOGIC  A/P: No acute issues monitor CBC NO LOVENOX Sub q hep  INFECTIOUS  A/P: PNA ?septic emboli Vanc, azactam, levaquin  F/u Blood, Urine, Sputum cultures and RVP Needs echo ID consult Low threshold voriconazole (oral with renal failure) if declines  ENDOCRINE  A/P: DM Insulin drip to d/c, will give lantus 35 u SSI  cbgs  NEUROLOGIC  A/P: No acute issues Avoid benzo  Tommi Rumps, MD [place] PGY-2  TODAY'S SUMMARY: continue antibiotics, needs echo, monitor renal status, transition to  sq insulin, no longer requires BIPAP, consider sdu  I have personally obtained a history, examined the patient, evaluated laboratory and imaging results, formulated the assessment and plan and placed orders.   Lavon Paganini. Titus Mould, MD, [place] Pgr: [facility]  Pulmonary and [place] Pager: [phone]  11/26/2013, 5:58 AM

## 2013-11-26 NOTE — Consult Note (Signed)
INFECTIOUS DISEASE CONSULT NOTE  Date of Admission:  11/24/2013  Date of Consult:  11/26/2013  Reason for Consult: Septic Emboli Referring Physician: Titus Mould  Impression/Recommendation SIRS ARF, acidosis Septic Emboli?  Would Check TEE Await Resp cx/ucx/BCx pending, respiratory panel pending Check Fungal Ab panel Check HIV, acute hepatitis  Comment He denies drug use, recent injections, dental work. He does not appear to have HIV risk factors (is married). He did live in [place] prior to moving to [place] 10 years ago (would consider [place]). He has been on anbx since admission, not clear if his BCx were done prior to this.   Thank you so much for this interesting consult,   Kenneth Patrick (pager) [phone] [email or web address]  Kenneth Patrick is an 63 y.o. male.  HPI: 63 yo M with hx of DM2 (since 1997), COPD who was adm to [facility] 2-20 after a near syncopal episode. Prior to this he had 3 weeks of SOB, yellow-green sputum and chills. He was transferred to [facility] on same day (on BiPAP) after ABG showed acidemia, acute renal failure. CXR showed diffuse interstitial opacities. He was started on levaquin. He had aztreonam added on 2-21.  He underwent CT chest today: 1. Multiple abnormal findings are identified in both lungs which are likely inflammatory or infectious in etiology. 2. Bilateral lower lobe bronchiectasis left-greater-than-right is favored to be the sequela of chronic aspiration. 3. Multiple tree in bud nodules are scattered throughout the right lung compatible with inflammatory or infectious bronchiolitis. 4. Multi focal nodules with either central cavitation and or peripheral ground-glass attenuation are noted. Differential considerations include septic emboli as well as fungal infection. 5. A few calcified mediastinal lymph nodes are identified compatible with prior granulomatous disease. 6. Atherosclerotic disease including coronary artery  calcifications.  Vancomycin added today. TTE done today, result pending.  Has been afebrile in hosptial.    Past Medical History  Diagnosis Date  . Diabetic neuropathy   . Respiratory infection   . Low back pain   . Asthma   . Dyspnea   . Sleep apnea   . Depression   . DM2 (diabetes mellitus, type 2)   . Hypercholesterolemia     Past Surgical History  Procedure Laterality Date  . Cholecystectomy    . Left eye enucleation following trauma       Allergies  Allergen Reactions  . Penicillins     Childhood allergy     Medications:  Scheduled: . albuterol  2.5 mg Nebulization Q4H  . antiseptic oral rinse  15 mL Mouth Rinse q12n4p  . aztreonam  1 g Intravenous 3 times per day  . budesonide-formoterol  2 puff Inhalation BID  . chlorhexidine  15 mL Mouth Rinse BID  . gabapentin  300 mg Oral BID  . heparin  5,000 Units Subcutaneous 3 times per day  . insulin aspart  0-15 Units Subcutaneous 6 times per day  . insulin glargine  35 Units Subcutaneous Q24H  . levofloxacin (LEVAQUIN) IV  750 mg Intravenous Q48H  . sertraline  200 mg Oral Daily  . vancomycin  2,000 mg Intravenous Q24H    Total days of antibiotics: 3 (levaquin, aztreonam, vanco)          Social History:  reports that he quit smoking about 9 years ago. He has never used smokeless tobacco. He reports that he does not drink alcohol or use illicit drugs.  Family History  Problem Relation Age of Onset  . Leukemia Father   .  Diabetes Mother     General ROS: +neuropathic pain, no fevers, no problem with urination or BM. see HPI.   Blood pressure 114/53, pulse 96, temperature 97.3 F (36.3 C), temperature source Oral, resp. rate 19, height 6' 4"  (1.93 m), weight 150 kg (330 lb 11 oz), SpO2 92.00%. General appearance: alert, cooperative, fatigued and no distress Eyes: negative findings: conjunctivae and sclerae normal and pupils equal, round, reactive to light and accomodation Throat: normal findings: oropharynx  pink & moist without lesions or evidence of thrush Neck: no adenopathy, supple, symmetrical, trachea midline and R neck HD line Lungs: diminished breath sounds anterior - bilateral and coarse breath sounds Heart: regular rate and rhythm Abdomen: normal findings: bowel sounds normal and soft, non-tender and abnormal findings:  distended Extremities: edema none and no diabetic foot lesions. Decreased light touch sensation   Results for orders placed during the hospital encounter of 11/24/13 (from the past 48 hour(s))  GLUCOSE, CAPILLARY     Status: Abnormal   Collection Time    11/24/13 10:53 PM      Result Value Ref Range   Glucose-Capillary 256 (*) 70 - 99 mg/dL  MRSA PCR SCREENING     Status: None   Collection Time    11/24/13 11:42 PM      Result Value Ref Range   MRSA by PCR NEGATIVE  NEGATIVE   Comment:            The GeneXpert MRSA Assay (FDA     approved for NASAL specimens     only), is one component of a     comprehensive MRSA colonization     surveillance program. It is not     intended to diagnose MRSA     infection nor to guide or     monitor treatment for     MRSA infections.  POCT I-STAT 3, BLOOD GAS (G3+)     Status: Abnormal   Collection Time    11/25/13  1:14 AM      Result Value Ref Range   pH, Arterial 7.140 (*) 7.350 - 7.450   pCO2 arterial 37.0  35.0 - 45.0 mmHg   pO2, Arterial 91.0  80.0 - 100.0 mmHg   Bicarbonate 12.6 (*) 20.0 - 24.0 mEq/L   TCO2 14  0 - 100 mmol/L   O2 Saturation 94.0     Acid-base deficit 16.0 (*) 0.0 - 2.0 mmol/L   Patient temperature 98.6 F     Collection site RADIAL, ALLEN'S TEST ACCEPTABLE     Drawn by RT     Sample type ARTERIAL     Comment NOTIFIED PHYSICIAN    COMPREHENSIVE METABOLIC PANEL     Status: Abnormal   Collection Time    11/25/13  2:00 AM      Result Value Ref Range   Sodium 129 (*) 137 - 147 mEq/L   Potassium 5.8 (*) 3.7 - 5.3 mEq/L   Chloride 96  96 - 112 mEq/L   CO2 11 (*) 19 - 32 mEq/L   Glucose, Bld  373 (*) 70 - 99 mg/dL   BUN 76 (*) 6 - 23 mg/dL   Creatinine, Ser 5.07 (*) 0.50 - 1.35 mg/dL   Calcium 8.3 (*) 8.4 - 10.5 mg/dL   Total Protein 7.0  6.0 - 8.3 g/dL   Albumin 2.2 (*) 3.5 - 5.2 g/dL   AST 20  0 - 37 U/L   ALT 22  0 - 53 U/L   Alkaline Phosphatase  127 (*) 39 - 117 U/L   Total Bilirubin <0.2 (*) 0.3 - 1.2 mg/dL   GFR calc non Af Amer 11 (*) >90 mL/min   GFR calc Af Amer 13 (*) >90 mL/min   Comment: (NOTE)     The eGFR has been calculated using the CKD EPI equation.     This calculation has not been validated in all clinical situations.     eGFR's persistently <90 mL/min signify possible Chronic Kidney     Disease.  PHOSPHORUS     Status: Abnormal   Collection Time    11/25/13  2:00 AM      Result Value Ref Range   Phosphorus 4.7 (*) 2.3 - 4.6 mg/dL  MAGNESIUM     Status: None   Collection Time    11/25/13  2:00 AM      Result Value Ref Range   Magnesium 2.1  1.5 - 2.5 mg/dL  TROPONIN I     Status: None   Collection Time    11/25/13  2:00 AM      Result Value Ref Range   Troponin I <0.30  <0.30 ng/mL   Comment:            Due to the release kinetics of cTnI,     a negative result within the first hours     of the onset of symptoms does not rule out     myocardial infarction with certainty.     If myocardial infarction is still suspected,     repeat the test at appropriate intervals.  LACTIC ACID, PLASMA     Status: None   Collection Time    11/25/13  2:00 AM      Result Value Ref Range   Lactic Acid, Venous 0.9  0.5 - 2.2 mmol/L  PROCALCITONIN     Status: None   Collection Time    11/25/13  2:00 AM      Result Value Ref Range   Procalcitonin 2.50     Comment:            Interpretation:     PCT > 2 ng/mL:     Systemic infection (sepsis) is likely,     unless other causes are known.     (NOTE)             ICU PCT Algorithm               Non ICU PCT Algorithm        ----------------------------     ------------------------------             PCT < 0.25  ng/mL                 PCT < 0.1 ng/mL         Stopping of antibiotics            Stopping of antibiotics           strongly encouraged.               strongly encouraged.        ----------------------------     ------------------------------           PCT level decrease by               PCT < 0.25 ng/mL           >= 80% from peak PCT           OR PCT 0.25 -  0.5 ng/mL          Stopping of antibiotics                                                 encouraged.         Stopping of antibiotics               encouraged.        ----------------------------     ------------------------------           PCT level decrease by              PCT >= 0.25 ng/mL           < 80% from peak PCT            AND PCT >= 0.5 ng/mL            Continuing antibiotics                                                  encouraged.           Continuing antibiotics                encouraged.        ----------------------------     ------------------------------         PCT level increase compared          PCT > 0.5 ng/mL             with peak PCT AND              PCT >= 0.5 ng/mL             Escalation of antibiotics                                              strongly encouraged.          Escalation of antibiotics            strongly encouraged.  PRO B NATRIURETIC PEPTIDE     Status: Abnormal   Collection Time    11/25/13  2:00 AM      Result Value Ref Range   Pro B Natriuretic peptide (BNP) 199.5 (*) 0 - 125 pg/mL  CORTISOL     Status: None   Collection Time    11/25/13  2:00 AM      Result Value Ref Range   Cortisol, Plasma 37.6     Comment: (NOTE)     AM:  4.3 - 22.4 ug/dL     PM:  3.1 - 16.7 ug/dL     Performed at [facility]  CBC WITH DIFFERENTIAL     Status: Abnormal   Collection Time    11/25/13  2:00 AM      Result Value Ref Range   WBC 16.0 (*) 4.0 - 10.5 K/uL   RBC 3.33 (*) 4.22 - 5.81 MIL/uL   Hemoglobin 9.5 (*) 13.0 - 17.0 g/dL   HCT 29.4 (*) 39.0 - 52.0 %   MCV 88.3  78.0 - 100.0  fL   MCH 28.5  26.0 - 34.0 pg   MCHC 32.3  30.0 - 36.0 g/dL   RDW 16.0 (*) 11.5 - 15.5 %   Platelets 309  150 - 400 K/uL   Neutrophils Relative % 92 (*) 43 - 77 %   Lymphocytes Relative 4 (*) 12 - 46 %   Monocytes Relative 4  3 - 12 %   Eosinophils Relative 0  0 - 5 %   Basophils Relative 0  0 - 1 %   Neutro Abs 14.8 (*) 1.7 - 7.7 K/uL   Lymphs Abs 0.6 (*) 0.7 - 4.0 K/uL   Monocytes Absolute 0.6  0.1 - 1.0 K/uL   Eosinophils Absolute 0.0  0.0 - 0.7 K/uL   Basophils Absolute 0.0  0.0 - 0.1 K/uL   WBC Morphology INCREASED BANDS (>20% BANDS)     Comment: TOXIC GRANULATION  PROTIME-INR     Status: None   Collection Time    11/25/13  2:00 AM      Result Value Ref Range   Prothrombin Time 14.6  11.6 - 15.2 seconds   INR 1.16  0.00 - 1.49  STREP PNEUMONIAE URINARY ANTIGEN     Status: None   Collection Time    11/25/13  2:18 AM      Result Value Ref Range   Strep Pneumo Urinary Antigen NEGATIVE  NEGATIVE   Comment:            Infection due to S. pneumoniae     cannot be absolutely ruled out     since the antigen present     may be below the detection limit     of the test.  SODIUM, URINE, RANDOM     Status: None   Collection Time    11/25/13  2:18 AM      Result Value Ref Range   Sodium, Ur 32    CREATININE, URINE, RANDOM     Status: None   Collection Time    11/25/13  2:18 AM      Result Value Ref Range   Creatinine, Urine 235.69    URINALYSIS, ROUTINE W REFLEX MICROSCOPIC     Status: Abnormal   Collection Time    11/25/13  2:19 AM      Result Value Ref Range   Color, Urine AMBER (*) YELLOW   Comment: BIOCHEMICALS MAY BE AFFECTED BY COLOR   APPearance TURBID (*) CLEAR   Specific Gravity, Urine 1.027  1.005 - 1.030   pH 5.0  5.0 - 8.0   Glucose, UA 500 (*) NEGATIVE mg/dL   Hgb urine dipstick LARGE (*) NEGATIVE   Bilirubin Urine SMALL (*) NEGATIVE   Ketones, ur NEGATIVE  NEGATIVE mg/dL   Protein, ur 100 (*) NEGATIVE mg/dL   Urobilinogen, UA 0.2  0.0 - 1.0 mg/dL    Nitrite NEGATIVE  NEGATIVE   Leukocytes, UA SMALL (*) NEGATIVE  LEGIONELLA ANTIGEN, URINE     Status: None   Collection Time    11/25/13  2:19 AM      Result Value Ref Range   Specimen Description URINE, CATHETERIZED     Special Requests NONE     Legionella Antigen, Urine       Value: Negative for Legionella pneumophilia serogroup 1     Performed at [facility]   Report Status 11/26/2013 FINAL    UREA NITROGEN, URINE     Status: None   Collection Time    11/25/13  2:19 AM      Result Value Ref Range   Urea Nitrogen, Ur 148     Comment: (NOTE)       No established reference range for random urine.     Performed at [place]     Status: Abnormal   Collection Time    11/25/13  2:19 AM      Result Value Ref Range   Squamous Epithelial / LPF RARE  RARE   WBC, UA 3-6  <3 WBC/hpf   RBC / HPF 21-50  <3 RBC/hpf   Bacteria, UA MANY (*) RARE  BASIC METABOLIC PANEL     Status: Abnormal   Collection Time    11/25/13  3:40 AM      Result Value Ref Range   Sodium 132 (*) 137 - 147 mEq/L   Potassium 6.0 (*) 3.7 - 5.3 mEq/L   Chloride 97  96 - 112 mEq/L   CO2 12 (*) 19 - 32 mEq/L   Glucose, Bld 405 (*) 70 - 99 mg/dL   BUN 80 (*) 6 - 23 mg/dL   Creatinine, Ser 5.13 (*) 0.50 - 1.35 mg/dL   Calcium 8.6  8.4 - 10.5 mg/dL   GFR calc non Af Amer 11 (*) >90 mL/min   GFR calc Af Amer 13 (*) >90 mL/min   Comment: (NOTE)     The eGFR has been calculated using the CKD EPI equation.     This calculation has not been validated in all clinical situations.     eGFR's persistently <90 mL/min signify possible Chronic Kidney     Disease.  MAGNESIUM     Status: None   Collection Time    11/25/13  3:40 AM      Result Value Ref Range   Magnesium 2.2  1.5 - 2.5 mg/dL  PHOSPHORUS     Status: Abnormal   Collection Time    11/25/13  3:40 AM      Result Value Ref Range   Phosphorus 5.1 (*) 2.3 - 4.6 mg/dL  CBC     Status: Abnormal   Collection Time     11/25/13  3:40 AM      Result Value Ref Range   WBC 13.5 (*) 4.0 - 10.5 K/uL   RBC 3.50 (*) 4.22 - 5.81 MIL/uL   Hemoglobin 10.0 (*) 13.0 - 17.0 g/dL   HCT 30.4 (*) 39.0 - 52.0 %   MCV 86.9  78.0 - 100.0 fL   MCH 28.6  26.0 - 34.0 pg   MCHC 32.9  30.0 - 36.0 g/dL   RDW 16.2 (*) 11.5 - 15.5 %   Platelets 349  150 - 400 K/uL  GLUCOSE, CAPILLARY     Status: Abnormal   Collection Time    11/25/13  3:59 AM      Result Value Ref Range   Glucose-Capillary 374 (*) 70 - 99 mg/dL   Comment 1 Documented in Chart     Comment 2 Notify RN    BASIC METABOLIC PANEL     Status: Abnormal   Collection Time    11/25/13  5:14 AM      Result Value Ref Range   Sodium 133 (*) 137 - 147 mEq/L   Potassium 5.5 (*) 3.7 - 5.3 mEq/L   Chloride 95 (*) 96 - 112 mEq/L   CO2 12 (*) 19 - 32 mEq/L   Glucose, Bld 429 (*) 70 - 99 mg/dL   BUN  81 (*) 6 - 23 mg/dL   Creatinine, Ser 5.26 (*) 0.50 - 1.35 mg/dL   Calcium 8.4  8.4 - 10.5 mg/dL   GFR calc non Af Amer 11 (*) >90 mL/min   GFR calc Af Amer 12 (*) >90 mL/min   Comment: (NOTE)     The eGFR has been calculated using the CKD EPI equation.     This calculation has not been validated in all clinical situations.     eGFR's persistently <90 mL/min signify possible Chronic Kidney     Disease.  POCT I-STAT 3, BLOOD GAS (G3+)     Status: Abnormal   Collection Time    11/25/13  5:54 AM      Result Value Ref Range   pH, Arterial 7.168 (*) 7.350 - 7.450   pCO2 arterial 34.9 (*) 35.0 - 45.0 mmHg   pO2, Arterial 61.0 (*) 80.0 - 100.0 mmHg   Bicarbonate 12.7 (*) 20.0 - 24.0 mEq/L   TCO2 14  0 - 100 mmol/L   O2 Saturation 84.0     Acid-base deficit 15.0 (*) 0.0 - 2.0 mmol/L   Patient temperature 98.6 F     Collection site RADIAL, ALLEN'S TEST ACCEPTABLE     Drawn by Operator     Sample type ARTERIAL     Comment NOTIFIED PHYSICIAN    LACTIC ACID, PLASMA     Status: None   Collection Time    11/25/13  5:58 AM      Result Value Ref Range   Lactic Acid, Venous  1.0  0.5 - 2.2 mmol/L  TROPONIN I     Status: None   Collection Time    11/25/13  6:02 AM      Result Value Ref Range   Troponin I <0.30  <0.30 ng/mL   Comment:            Due to the release kinetics of cTnI,     a negative result within the first hours     of the onset of symptoms does not rule out     myocardial infarction with certainty.     If myocardial infarction is still suspected,     repeat the test at appropriate intervals.  GLUCOSE, CAPILLARY     Status: Abnormal   Collection Time    11/25/13  7:23 AM      Result Value Ref Range   Glucose-Capillary 403 (*) 70 - 99 mg/dL  GLUCOSE, CAPILLARY     Status: Abnormal   Collection Time    11/25/13 11:23 AM      Result Value Ref Range   Glucose-Capillary 432 (*) 70 - 99 mg/dL  BASIC METABOLIC PANEL     Status: Abnormal   Collection Time    11/25/13 12:00 PM      Result Value Ref Range   Sodium 132 (*) 137 - 147 mEq/L   Potassium 4.4  3.7 - 5.3 mEq/L   Comment: DELTA CHECK NOTED   Chloride 94 (*) 96 - 112 mEq/L   CO2 15 (*) 19 - 32 mEq/L   Glucose, Bld 469 (*) 70 - 99 mg/dL   BUN 85 (*) 6 - 23 mg/dL   Creatinine, Ser 5.17 (*) 0.50 - 1.35 mg/dL   Calcium 8.1 (*) 8.4 - 10.5 mg/dL   GFR calc non Af Amer 11 (*) >90 mL/min   GFR calc Af Amer 13 (*) >90 mL/min   Comment: (NOTE)     The eGFR has been  calculated using the CKD EPI equation.     This calculation has not been validated in all clinical situations.     eGFR's persistently <90 mL/min signify possible Chronic Kidney     Disease.  FERRITIN     Status: Abnormal   Collection Time    11/25/13 12:00 PM      Result Value Ref Range   Ferritin 373 (*) 22 - 322 ng/mL   Comment: Performed at [facility]     Status: Abnormal   Collection Time    11/25/13 12:00 PM      Result Value Ref Range   Iron 47  42 - 135 ug/dL   TIBC 204 (*) 215 - 435 ug/dL   Saturation Ratios 23  20 - 55 %   UIBC 157  125 - 400 ug/dL   Comment: Performed at [facility]     Status: None   Collection Time    11/25/13 12:00 PM      Result Value Ref Range   Vitamin B-12 374  211 - 911 pg/mL   Comment: Performed at [facility]     Status: Abnormal   Collection Time    11/25/13 12:00 PM      Result Value Ref Range   Haptoglobin 521 (*) 45 - 215 mg/dL   Comment: (NOTE)     Result repeated and verified.     Result confirmed by automatic dilution.     Performed at [place]     Status: Abnormal   Collection Time    11/25/13 12:15 PM      Result Value Ref Range   Sed Rate >140 (*) 0 - 16 mm/hr  URINALYSIS, ROUTINE W REFLEX MICROSCOPIC     Status: Abnormal   Collection Time    11/25/13 12:15 PM      Result Value Ref Range   Color, Urine YELLOW  YELLOW   APPearance CLEAR  CLEAR   Specific Gravity, Urine 1.016  1.005 - 1.030   pH 5.0  5.0 - 8.0   Glucose, UA 500 (*) NEGATIVE mg/dL   Hgb urine dipstick MODERATE (*) NEGATIVE   Bilirubin Urine NEGATIVE  NEGATIVE   Ketones, ur NEGATIVE  NEGATIVE mg/dL   Protein, ur NEGATIVE  NEGATIVE mg/dL   Urobilinogen, UA 0.2  0.0 - 1.0 mg/dL   Nitrite NEGATIVE  NEGATIVE   Leukocytes, UA NEGATIVE  NEGATIVE  URINE MICROSCOPIC-ADD ON     Status: Abnormal   Collection Time    11/25/13 12:15 PM      Result Value Ref Range   WBC, UA 0-2  <3 WBC/hpf   RBC / HPF 3-6  <3 RBC/hpf   Casts GRANULAR CAST (*) NEGATIVE  TROPONIN I     Status: None   Collection Time    11/25/13 12:24 PM      Result Value Ref Range   Troponin I <0.30  <0.30 ng/mL   Comment:            Due to the release kinetics of cTnI,     a negative result within the first hours     of the onset of symptoms does not rule out     myocardial infarction with certainty.     If myocardial infarction is still suspected,     repeat the test at appropriate intervals.  LACTIC ACID, PLASMA  Status: None   Collection Time    11/25/13 12:24 PM      Result Value Ref Range   Lactic  Acid, Venous 1.1  0.5 - 2.2 mmol/L  GLUCOSE, CAPILLARY     Status: Abnormal   Collection Time    11/25/13  3:22 PM      Result Value Ref Range   Glucose-Capillary 422 (*) 70 - 99 mg/dL  GLUCOSE, CAPILLARY     Status: Abnormal   Collection Time    11/25/13  5:04 PM      Result Value Ref Range   Glucose-Capillary 354 (*) 70 - 99 mg/dL  POCT I-STAT 3, BLOOD GAS (G3+)     Status: Abnormal   Collection Time    11/25/13  5:12 PM      Result Value Ref Range   pH, Arterial 7.305 (*) 7.350 - 7.450   pCO2 arterial 32.3 (*) 35.0 - 45.0 mmHg   pO2, Arterial 75.0 (*) 80.0 - 100.0 mmHg   Bicarbonate 16.2 (*) 20.0 - 24.0 mEq/L   TCO2 17  0 - 100 mmol/L   O2 Saturation 94.0     Acid-base deficit 9.0 (*) 0.0 - 2.0 mmol/L   Patient temperature 98.0 F     Collection site RADIAL, ALLEN'S TEST ACCEPTABLE     Drawn by Operator     Sample type ARTERIAL    GLUCOSE, CAPILLARY     Status: Abnormal   Collection Time    11/25/13  5:59 PM      Result Value Ref Range   Glucose-Capillary 375 (*) 70 - 99 mg/dL  BASIC METABOLIC PANEL     Status: Abnormal   Collection Time    11/25/13  6:00 PM      Result Value Ref Range   Sodium 133 (*) 137 - 147 mEq/L   Potassium 3.8  3.7 - 5.3 mEq/L   Chloride 94 (*) 96 - 112 mEq/L   CO2 18 (*) 19 - 32 mEq/L   Glucose, Bld 354 (*) 70 - 99 mg/dL   BUN 88 (*) 6 - 23 mg/dL   Creatinine, Ser 4.95 (*) 0.50 - 1.35 mg/dL   Calcium 8.0 (*) 8.4 - 10.5 mg/dL   GFR calc non Af Amer 11 (*) >90 mL/min   GFR calc Af Amer 13 (*) >90 mL/min   Comment: (NOTE)     The eGFR has been calculated using the CKD EPI equation.     This calculation has not been validated in all clinical situations.     eGFR's persistently <90 mL/min signify possible Chronic Kidney     Disease.  GLUCOSE, CAPILLARY     Status: Abnormal   Collection Time    11/25/13  7:15 PM      Result Value Ref Range   Glucose-Capillary 335 (*) 70 - 99 mg/dL  GLUCOSE, CAPILLARY     Status: Abnormal   Collection Time      11/25/13  8:11 PM      Result Value Ref Range   Glucose-Capillary 304 (*) 70 - 99 mg/dL  GLUCOSE, CAPILLARY     Status: Abnormal   Collection Time    11/25/13  9:02 PM      Result Value Ref Range   Glucose-Capillary 252 (*) 70 - 99 mg/dL  GLUCOSE, CAPILLARY     Status: Abnormal   Collection Time    11/25/13  9:57 PM      Result Value Ref Range   Glucose-Capillary 224 (*)  70 - 99 mg/dL  GLUCOSE, CAPILLARY     Status: Abnormal   Collection Time    11/25/13 10:59 PM      Result Value Ref Range   Glucose-Capillary 200 (*) 70 - 99 mg/dL  GLUCOSE, CAPILLARY     Status: Abnormal   Collection Time    11/25/13 11:54 PM      Result Value Ref Range   Glucose-Capillary 170 (*) 70 - 99 mg/dL  BASIC METABOLIC PANEL     Status: Abnormal   Collection Time    11/26/13 12:01 AM      Result Value Ref Range   Sodium 134 (*) 137 - 147 mEq/L   Potassium 3.5 (*) 3.7 - 5.3 mEq/L   Chloride 95 (*) 96 - 112 mEq/L   CO2 18 (*) 19 - 32 mEq/L   Glucose, Bld 171 (*) 70 - 99 mg/dL   BUN 86 (*) 6 - 23 mg/dL   Creatinine, Ser 4.72 (*) 0.50 - 1.35 mg/dL   Calcium 7.7 (*) 8.4 - 10.5 mg/dL   GFR calc non Af Amer 12 (*) >90 mL/min   GFR calc Af Amer 14 (*) >90 mL/min   Comment: (NOTE)     The eGFR has been calculated using the CKD EPI equation.     This calculation has not been validated in all clinical situations.     eGFR's persistently <90 mL/min signify possible Chronic Kidney     Disease.  GLUCOSE, CAPILLARY     Status: Abnormal   Collection Time    11/26/13  1:03 AM      Result Value Ref Range   Glucose-Capillary 141 (*) 70 - 99 mg/dL  GLUCOSE, CAPILLARY     Status: Abnormal   Collection Time    11/26/13  1:57 AM      Result Value Ref Range   Glucose-Capillary 140 (*) 70 - 99 mg/dL  GLUCOSE, CAPILLARY     Status: Abnormal   Collection Time    11/26/13  3:07 AM      Result Value Ref Range   Glucose-Capillary 126 (*) 70 - 99 mg/dL  GLUCOSE, CAPILLARY     Status: Abnormal   Collection  Time    11/26/13  4:01 AM      Result Value Ref Range   Glucose-Capillary 131 (*) 70 - 99 mg/dL  GLUCOSE, CAPILLARY     Status: Abnormal   Collection Time    11/26/13  4:58 AM      Result Value Ref Range   Glucose-Capillary 146 (*) 70 - 99 mg/dL  BASIC METABOLIC PANEL     Status: Abnormal   Collection Time    11/26/13  5:20 AM      Result Value Ref Range   Sodium 136 (*) 137 - 147 mEq/L   Potassium 3.5 (*) 3.7 - 5.3 mEq/L   Chloride 97  96 - 112 mEq/L   CO2 19  19 - 32 mEq/L   Glucose, Bld 141 (*) 70 - 99 mg/dL   BUN 86 (*) 6 - 23 mg/dL   Creatinine, Ser 4.53 (*) 0.50 - 1.35 mg/dL   Calcium 7.4 (*) 8.4 - 10.5 mg/dL   GFR calc non Af Amer 13 (*) >90 mL/min   GFR calc Af Amer 15 (*) >90 mL/min   Comment: (NOTE)     The eGFR has been calculated using the CKD EPI equation.     This calculation has not been validated in all clinical situations.  eGFR's persistently <90 mL/min signify possible Chronic Kidney     Disease.  CBC     Status: Abnormal   Collection Time    11/26/13  5:20 AM      Result Value Ref Range   WBC 10.7 (*) 4.0 - 10.5 K/uL   RBC 3.15 (*) 4.22 - 5.81 MIL/uL   Hemoglobin 9.1 (*) 13.0 - 17.0 g/dL   HCT 26.7 (*) 39.0 - 52.0 %   MCV 84.8  78.0 - 100.0 fL   MCH 28.9  26.0 - 34.0 pg   MCHC 34.1  30.0 - 36.0 g/dL   RDW 16.0 (*) 11.5 - 15.5 %   Platelets 307  150 - 400 K/uL  GLUCOSE, CAPILLARY     Status: Abnormal   Collection Time    11/26/13  6:01 AM      Result Value Ref Range   Glucose-Capillary 138 (*) 70 - 99 mg/dL  GLUCOSE, CAPILLARY     Status: Abnormal   Collection Time    11/26/13  6:53 AM      Result Value Ref Range   Glucose-Capillary 146 (*) 70 - 99 mg/dL  GLUCOSE, CAPILLARY     Status: Abnormal   Collection Time    11/26/13  7:57 AM      Result Value Ref Range   Glucose-Capillary 148 (*) 70 - 99 mg/dL  GLUCOSE, CAPILLARY     Status: Abnormal   Collection Time    11/26/13  8:55 AM      Result Value Ref Range   Glucose-Capillary 142 (*)  70 - 99 mg/dL  GLUCOSE, CAPILLARY     Status: Abnormal   Collection Time    11/26/13  9:59 AM      Result Value Ref Range   Glucose-Capillary 135 (*) 70 - 99 mg/dL  GLUCOSE, CAPILLARY     Status: Abnormal   Collection Time    11/26/13 12:28 PM      Result Value Ref Range   Glucose-Capillary 160 (*) 70 - 99 mg/dL      Component Value Date/Time   SDES URINE, CATHETERIZED 11/25/2013 0219   SPECREQUEST NONE 11/25/2013 0219   REPTSTATUS 11/26/2013 FINAL 11/25/2013 0219   Ct Chest Wo Contrast  11/25/2013   CLINICAL DATA:  Worsening shortness of breath.  Respiratory failure.  EXAM: CT CHEST WITHOUT CONTRAST  TECHNIQUE: Multidetector CT imaging of the chest was performed following the standard protocol without IV contrast.  COMPARISON:  01/15/2012  FINDINGS: No pleural effusion identified. Airspace consolidation and atelectasis is noted within the posterior right lower lobe.  There are multiple tree-in-bud nodules identified throughout the right upper lobe, right lower lobe and right middle lobe. Solid nodule with surrounding ground-glass attenuation is identified in the right upper lobe measuring 1.8 cm, image 20/series 4. Similarly there is a left lower lobe, solid nodule measuring 1.5 cm, image 38/series 4. Bronchiectasis is identified within both lower lobes, left greater than right. A few peripheral nodules may have central cavitation. For example, within the periphery of the right middle lobe there is a 1.6 cm nodule, image 28/ series 4. Within the left lower lobe there is a 1.2 cm cavitary nodule.  The trachea appears patent and is midline. The heart size appears normal. Calcified stress set partially calcified right paratracheal and sub- carinal lymph nodes are identified compatible with prior granulomatous disease. Noncalcified right paratracheal lymph node measures 8 mm, image 13/series 3. Noncalcified sub- carinal lymph node is enlarged measuring 1.1 cm.  Calcified atherosclerotic disease also  involves the thoracic aorta as well as the LAD and RCA coronary arteries.  No axillary or supraclavicular adenopathy. Incidental imaging through the upper abdomen is unremarkable.  Review of the visualized bony structures is on unremarkable.  IMPRESSION: 1. Multiple abnormal findings are identified in both lungs which are likely inflammatory or infectious in etiology. 2. Bilateral lower lobe bronchiectasis left-greater-than-right is favored to be the sequela of chronic aspiration. 3. Multiple tree in bud nodules are scattered throughout the right lung compatible with inflammatory or infectious bronchiolitis. 4. Multi focal nodules with either central cavitation and or peripheral ground-glass attenuation are noted. Differential considerations include septic emboli as well as fungal infection. 5. A few calcified mediastinal lymph nodes are identified compatible with prior granulomatous disease. 6. Atherosclerotic disease including coronary artery calcifications.   Electronically Signed   By: Kerby Moors M.D.   On: 11/25/2013 16:11   US Renal  11/25/2013   CLINICAL DATA:  Elevated creatinine, diabetes  EXAM: RENAL/URINARY TRACT ULTRASOUND COMPLETE  COMPARISON:  None.  FINDINGS: Right Kidney:  Length: 14.1 cm. Echogenicity within normal limits. No mass or hydronephrosis visualized.  Left Kidney:  Length: 13.1 cm. Echogenicity within normal limits. No mass or hydronephrosis visualized.  Bladder:  Decompressed by Foley catheter  IMPRESSION: No acute finding by ultrasound.  Negative for hydronephrosis.   Electronically Signed   By: Daryll Brod M.D.   On: 11/25/2013 09:13   Dg Chest Port 1 View  11/25/2013   CLINICAL DATA:  Right jugular dialysis catheter placement  EXAM: PORTABLE CHEST - 1 VIEW  COMPARISON:  Portable exam 1238 hr compared to 11/25/2013 at 0021 hr  FINDINGS: New right jugular central venous catheter with tip projecting over mid SVC.  Normal heart size and mediastinal contours.  Pulmonary vascular  congestion.  Improved interstitial infiltrates since previous exam.  Bibasilar atelectasis.  No definite pleural effusion or pneumothorax.  IMPRESSION: Bibasilar atelectasis.  No pneumothorax following right jugular line placement.  The rapid improvement an infiltrates since the earlier study of 11/25/2013 suggests improved edema.   Electronically Signed   By: Lavonia Dana M.D.   On: 11/25/2013 13:12   Dg Chest Port 1 View  11/25/2013   CLINICAL DATA:  Hypoxia.  EXAM: PORTABLE CHEST - 1 VIEW  COMPARISON:  DG CHEST 1V PORT dated 11/24/2013; CT ANGIO CHEST dated 01/15/2012  FINDINGS: Mediastinum and hilar structures are normal. Patchy bilateral pulmonary infiltrates are again noted. Heart size normal. No pleural effusion or pneumothorax. No acute bony abnormality.  IMPRESSION: Prominent patchy bilateral pulmonary infiltrates consistent with pneumonia. Close follow-up chest x-rays recommended to demonstrate complete clearing.   Electronically Signed   By: Marcello Moores  Register   On: 11/25/2013 00:43   Recent Results (from the past 240 hour(s))  MRSA PCR SCREENING     Status: None   Collection Time    11/24/13 11:42 PM      Result Value Ref Range Status   MRSA by PCR NEGATIVE  NEGATIVE Final   Comment:            The GeneXpert MRSA Assay (FDA     approved for NASAL specimens     only), is one component of a     comprehensive MRSA colonization     surveillance program. It is not     intended to diagnose MRSA     infection nor to guide or     monitor treatment for     MRSA infections.  11/26/2013, 12:46 PM     LOS: 2 days

## 2013-11-26 NOTE — Progress Notes (Signed)
Patient ID: Kenneth Patrick, male   DOB: January 18, 1951, 63 y.o.   MRN: [id number] S:Pt feeling better O:BP 108/56  Pulse 94  Temp(Src) 97.8 F (36.6 C) (Oral)  Resp 17  Ht 6\' 4"  (1.93 m)  Wt 150 kg (330 lb 11 oz)  BMI 40.27 kg/m2  SpO2 94%  Intake/Output Summary (Last 24 hours) at 11/26/13 0836 Last data filed at 11/26/13 0700  Gross per 24 hour  Intake 4312.24 ml  Output   2775 ml  Net 1537.24 ml   Intake/Output: I/O last 3 completed shifts: In: 5518.2 [P.O.:360; I.V.:3892.2; IV Piggyback:1266] Out: 6948 [NIOEV:0350]  Intake/Output this shift:    Weight change: 1.3 kg (2 lb 13.9 oz) Gen:WD WN in NAD, wearing [place] CVS:no rub Resp:bibasilar crackles KXF:GHWEX, +BS,soft, NT Ext:tr edema   Recent Labs Lab 11/25/13 0200 11/25/13 0340 11/25/13 0514 11/25/13 1200 11/25/13 1800 11/26/13 0001 11/26/13 0520  NA 129* 132* 133* 132* 133* 134* 136*  K 5.8* 6.0* 5.5* 4.4 3.8 3.5* 3.5*  CL 96 97 95* 94* 94* 95* 97  CO2 11* 12* 12* 15* 18* 18* 19  GLUCOSE 373* 405* 429* 469* 354* 171* 141*  BUN 76* 80* 81* 85* 88* 86* 86*  CREATININE 5.07* 5.13* 5.26* 5.17* 4.95* 4.72* 4.53*  ALBUMIN 2.2*  --   --   --   --   --   --   CALCIUM 8.3* 8.6 8.4 8.1* 8.0* 7.7* 7.4*  PHOS 4.7* 5.1*  --   --   --   --   --   AST 20  --   --   --   --   --   --   ALT 22  --   --   --   --   --   --    Liver Function Tests:  Recent Labs Lab 11/25/13 0200  AST 20  ALT 22  ALKPHOS 127*  BILITOT <0.2*  PROT 7.0  ALBUMIN 2.2*   No results found for this basename: LIPASE, AMYLASE,  in the last 168 hours No results found for this basename: AMMONIA,  in the last 168 hours CBC:  Recent Labs Lab 11/25/13 0200 11/25/13 0340 11/26/13 0520  WBC 16.0* 13.5* 10.7*  NEUTROABS 14.8*  --   --   HGB 9.5* 10.0* 9.1*  HCT 29.4* 30.4* 26.7*  MCV 88.3 86.9 84.8  PLT 309 349 307   Cardiac Enzymes:  Recent Labs Lab 11/25/13 0200 11/25/13 0602 11/25/13 1224  TROPONINI <0.30 <0.30 <0.30    CBG:  Recent Labs Lab 11/26/13 0307 11/26/13 0401 11/26/13 0458 11/26/13 0601 11/26/13 0653  GLUCAP 126* 131* 146* 138* 146*    Iron Studies:  Recent Labs  11/25/13 1200  IRON 47  TIBC 204*  FERRITIN 373*   Studies/Results: Ct Chest Wo Contrast  11/25/2013   CLINICAL DATA:  Worsening shortness of breath.  Respiratory failure.  EXAM: CT CHEST WITHOUT CONTRAST  TECHNIQUE: Multidetector CT imaging of the chest was performed following the standard protocol without IV contrast.  COMPARISON:  01/15/2012  FINDINGS: No pleural effusion identified. Airspace consolidation and atelectasis is noted within the posterior right lower lobe.  There are multiple tree-in-bud nodules identified throughout the right upper lobe, right lower lobe and right middle lobe. Solid nodule with surrounding ground-glass attenuation is identified in the right upper lobe measuring 1.8 cm, image 20/series 4. Similarly there is a left lower lobe, solid nodule measuring 1.5 cm, image 38/series 4. Bronchiectasis is identified within both lower  lobes, left greater than right. A few peripheral nodules may have central cavitation. For example, within the periphery of the right middle lobe there is a 1.6 cm nodule, image 28/ series 4. Within the left lower lobe there is a 1.2 cm cavitary nodule.  The trachea appears patent and is midline. The heart size appears normal. Calcified stress set partially calcified right paratracheal and sub- carinal lymph nodes are identified compatible with prior granulomatous disease. Noncalcified right paratracheal lymph node measures 8 mm, image 13/series 3. Noncalcified sub- carinal lymph node is enlarged measuring 1.1 cm. Calcified atherosclerotic disease also involves the thoracic aorta as well as the LAD and RCA coronary arteries.  No axillary or supraclavicular adenopathy. Incidental imaging through the upper abdomen is unremarkable.  Review of the visualized bony structures is on unremarkable.   IMPRESSION: 1. Multiple abnormal findings are identified in both lungs which are likely inflammatory or infectious in etiology. 2. Bilateral lower lobe bronchiectasis left-greater-than-right is favored to be the sequela of chronic aspiration. 3. Multiple tree in bud nodules are scattered throughout the right lung compatible with inflammatory or infectious bronchiolitis. 4. Multi focal nodules with either central cavitation and or peripheral ground-glass attenuation are noted. Differential considerations include septic emboli as well as fungal infection. 5. A few calcified mediastinal lymph nodes are identified compatible with prior granulomatous disease. 6. Atherosclerotic disease including coronary artery calcifications.   Electronically Signed   By: Kerby Moors M.D.   On: 11/25/2013 16:11   US Renal  11/25/2013   CLINICAL DATA:  Elevated creatinine, diabetes  EXAM: RENAL/URINARY TRACT ULTRASOUND COMPLETE  COMPARISON:  None.  FINDINGS: Right Kidney:  Length: 14.1 cm. Echogenicity within normal limits. No mass or hydronephrosis visualized.  Left Kidney:  Length: 13.1 cm. Echogenicity within normal limits. No mass or hydronephrosis visualized.  Bladder:  Decompressed by Foley catheter  IMPRESSION: No acute finding by ultrasound.  Negative for hydronephrosis.   Electronically Signed   By: Daryll Brod M.D.   On: 11/25/2013 09:13   Dg Chest Port 1 View  11/25/2013   CLINICAL DATA:  Right jugular dialysis catheter placement  EXAM: PORTABLE CHEST - 1 VIEW  COMPARISON:  Portable exam 1238 hr compared to 11/25/2013 at 0021 hr  FINDINGS: New right jugular central venous catheter with tip projecting over mid SVC.  Normal heart size and mediastinal contours.  Pulmonary vascular congestion.  Improved interstitial infiltrates since previous exam.  Bibasilar atelectasis.  No definite pleural effusion or pneumothorax.  IMPRESSION: Bibasilar atelectasis.  No pneumothorax following right jugular line placement.  The  rapid improvement an infiltrates since the earlier study of 11/25/2013 suggests improved edema.   Electronically Signed   By: Lavonia Dana M.D.   On: 11/25/2013 13:12   Dg Chest Port 1 View  11/25/2013   CLINICAL DATA:  Hypoxia.  EXAM: PORTABLE CHEST - 1 VIEW  COMPARISON:  DG CHEST 1V PORT dated 11/24/2013; CT ANGIO CHEST dated 01/15/2012  FINDINGS: Mediastinum and hilar structures are normal. Patchy bilateral pulmonary infiltrates are again noted. Heart size normal. No pleural effusion or pneumothorax. No acute bony abnormality.  IMPRESSION: Prominent patchy bilateral pulmonary infiltrates consistent with pneumonia. Close follow-up chest x-rays recommended to demonstrate complete clearing.   Electronically Signed   By: Marcello Moores  Register   On: 11/25/2013 00:43   . albuterol  2.5 mg Nebulization Q4H  . antiseptic oral rinse  15 mL Mouth Rinse q12n4p  . aztreonam  1 g Intravenous 3 times per day  .  budesonide-formoterol  2 puff Inhalation BID  . chlorhexidine  15 mL Mouth Rinse BID  . gabapentin  300 mg Oral BID  . heparin  5,000 Units Subcutaneous 3 times per day  . insulin aspart  1-3 Units Subcutaneous 6 times per day  . insulin glargine  35 Units Subcutaneous Q24H  . levofloxacin (LEVAQUIN) IV  750 mg Intravenous Q48H  . potassium chloride  10 mEq Intravenous Q1 Hr x 2  . sertraline  200 mg Oral Daily  . vancomycin  2,000 mg Intravenous Q24H    BMET    Component Value Date/Time   NA 136* 11/26/2013 0520   K 3.5* 11/26/2013 0520   CL 97 11/26/2013 0520   CO2 19 11/26/2013 0520   GLUCOSE 141* 11/26/2013 0520   BUN 86* 11/26/2013 0520   CREATININE 4.53* 11/26/2013 0520   CALCIUM 7.4* 11/26/2013 0520   GFRNONAA 13* 11/26/2013 0520   GFRAA 15* 11/26/2013 0520   CBC    Component Value Date/Time   WBC 10.7* 11/26/2013 0520   RBC 3.15* 11/26/2013 0520   HGB 9.1* 11/26/2013 0520   HCT 26.7* 11/26/2013 0520   PLT 307 11/26/2013 0520   MCV 84.8 11/26/2013 0520   MCH 28.9 11/26/2013 0520   MCHC 34.1  11/26/2013 0520   RDW 16.0* 11/26/2013 0520   LYMPHSABS 0.6* 11/25/2013 0200   MONOABS 0.6 11/25/2013 0200   EOSABS 0.0 11/25/2013 0200   BASOSABS 0.0 11/25/2013 0200     Assessment/Plan:  1. AKI- likely due to ischemic ATN in setting of SIRS with concomitant use of ACE-I, NSAIDs and metformin, as well as pre-renal (FeNa <1%).  1. Improving with gentle hydration 2. Cont to hold ACE and NSAIDs 3. Agree with pulm-renal syndrome w/u but most likely hemodynamically mediated with above agents and acute illness 1. Labs still pending at this time 4. UOP markedly increased to >3057ml over last 24 hours,  5. no indication for HD at this time. 6. Renal US without hydro, +echogenicity 2. Metabolic acidosis- as above: AKI and metformin  1. improved with IV Bicarb 2. IVF's per PCCM 3. Hypoxic resp failure/CAP  1. Per PCCM 4. SIRS- per PCCM 5. COPD- per PCCM 6. DM- hyperglycemia- would change bicarb to isotonic bicarb without D5 7. OSA- on BiPap 8. Anemia- ?acute or chronic, will need outside labs  1. Check SPEP/UPEP, iron studies 9. Protein malnutrition- will evaluate for possible nephrotic syndrome 10.   Grannis A

## 2013-11-26 NOTE — Progress Notes (Signed)
RT Note: Placed pt on CPAP 15cm H2o  on FFM and 10L bleed in. Pt tolerated about 10 minutes and said he could not wear it. Placed back on 4l [place]. SPO2 89%, RT will continue to monitor

## 2013-11-27 ENCOUNTER — Inpatient Hospital Stay (HOSPITAL_COMMUNITY): Payer: 59

## 2013-11-27 DIAGNOSIS — N289 Disorder of kidney and ureter, unspecified: Secondary | ICD-10-CM

## 2013-11-27 DIAGNOSIS — R918 Other nonspecific abnormal finding of lung field: Secondary | ICD-10-CM

## 2013-11-27 LAB — RESPIRATORY VIRUS PANEL
ADENOVIRUS: NOT DETECTED
INFLUENZA B 1: NOT DETECTED
Influenza A H1: NOT DETECTED
Influenza A H3: NOT DETECTED
Influenza A: NOT DETECTED
Metapneumovirus: NOT DETECTED
PARAINFLUENZA 1 A: NOT DETECTED
PARAINFLUENZA 2 A: NOT DETECTED
PARAINFLUENZA 3 A: NOT DETECTED
RESPIRATORY SYNCYTIAL VIRUS B: DETECTED — AB
Respiratory Syncytial Virus A: NOT DETECTED
Rhinovirus: NOT DETECTED

## 2013-11-27 LAB — BASIC METABOLIC PANEL
BUN: 74 mg/dL — ABNORMAL HIGH (ref 6–23)
BUN: 65 mg/dL — ABNORMAL HIGH (ref 6–23)
CHLORIDE: 101 meq/L (ref 96–112)
CO2: 22 mEq/L (ref 19–32)
CO2: 19 mEq/L (ref 19–32)
Calcium: 7.1 mg/dL — ABNORMAL LOW (ref 8.4–10.5)
Calcium: 7.7 mg/dL — ABNORMAL LOW (ref 8.4–10.5)
Chloride: 103 mEq/L (ref 96–112)
Creatinine, Ser: 2.91 mg/dL — ABNORMAL HIGH (ref 0.50–1.35)
Creatinine, Ser: 2.02 mg/dL — ABNORMAL HIGH (ref 0.50–1.35)
GFR calc Af Amer: 25 mL/min — ABNORMAL LOW (ref >90)
GFR calc non Af Amer: 22 mL/min — ABNORMAL LOW (ref >90)
GFR calc non Af Amer: 34 mL/min — ABNORMAL LOW (ref >90)
GFR, EST AFRICAN AMERICAN: 39 mL/min — AB (ref >90)
Glucose, Bld: 167 mg/dL — ABNORMAL HIGH (ref 70–99)
Glucose, Bld: 200 mg/dL — ABNORMAL HIGH (ref 70–99)
POTASSIUM: 3.5 meq/L — AB (ref 3.7–5.3)
POTASSIUM: 4.3 meq/L (ref 3.7–5.3)
SODIUM: 142 meq/L (ref 137–147)
Sodium: 139 mEq/L (ref 137–147)

## 2013-11-27 LAB — ANTI-DNA ANTIBODY, DOUBLE-STRANDED: ds DNA Ab: <1 IU/mL

## 2013-11-27 LAB — POCT I-STAT 3, ART BLOOD GAS (G3+)
Acid-base deficit: 3 mmol/L — ABNORMAL HIGH (ref 0.0–2.0)
Bicarbonate: 21.6 mEq/L (ref 20.0–24.0)
O2 SAT: 91 %
TCO2: 23 mmol/L (ref 0–100)
pCO2 arterial: 37.8 mmHg (ref 35.0–45.0)
pH, Arterial: 7.364 (ref 7.350–7.450)
pO2, Arterial: 63 mmHg — ABNORMAL LOW (ref 80.0–100.0)

## 2013-11-27 LAB — ANCA TITERS: C-ANCA: 1:20 {titer} — ABNORMAL HIGH

## 2013-11-27 LAB — ANCA SCREEN W REFLEX TITER
Atypical p-ANCA Screen: NEGATIVE
C-ANCA SCREEN: POSITIVE — AB
P-ANCA SCREEN: NEGATIVE

## 2013-11-27 LAB — GLUCOSE, CAPILLARY
GLUCOSE-CAPILLARY: 157 mg/dL — AB (ref 70–99)
GLUCOSE-CAPILLARY: 174 mg/dL — AB (ref 70–99)
Glucose-Capillary: 169 mg/dL — ABNORMAL HIGH (ref 70–99)
Glucose-Capillary: 161 mg/dL — ABNORMAL HIGH (ref 70–99)
Glucose-Capillary: 171 mg/dL — ABNORMAL HIGH (ref 70–99)
Glucose-Capillary: 215 mg/dL — ABNORMAL HIGH (ref 70–99)

## 2013-11-27 LAB — ANA: ANA: NEGATIVE

## 2013-11-27 LAB — EXTRACTABLE NUCLEAR ANTIGEN ANTIBODY
ENA SM Ab Ser-aCnc: <1
SSA (Ro) (ENA) Antibody, IgG: <1
SSB (La) (ENA) Antibody, IgG: <1
Scleroderma (Scl-70) (ENA) Antibody, IgG: <1
Sm/rnp: <1

## 2013-11-27 LAB — GLOMERULAR BASEMENT MEMBRANE ANTIBODIES: GBM Ab: <1

## 2013-11-27 IMAGING — CR DG CHEST 1V PORT
1 series · 1 of 1 positions shown · non-contrast
Comparison: DG CHEST 1V PORT dated [DATE]; CT CHEST W/O CM dated
[DATE]

CLINICAL DATA: Septic emboli and shortness of breath.

EXAM:
PORTABLE CHEST - 1 VIEW

[AP]
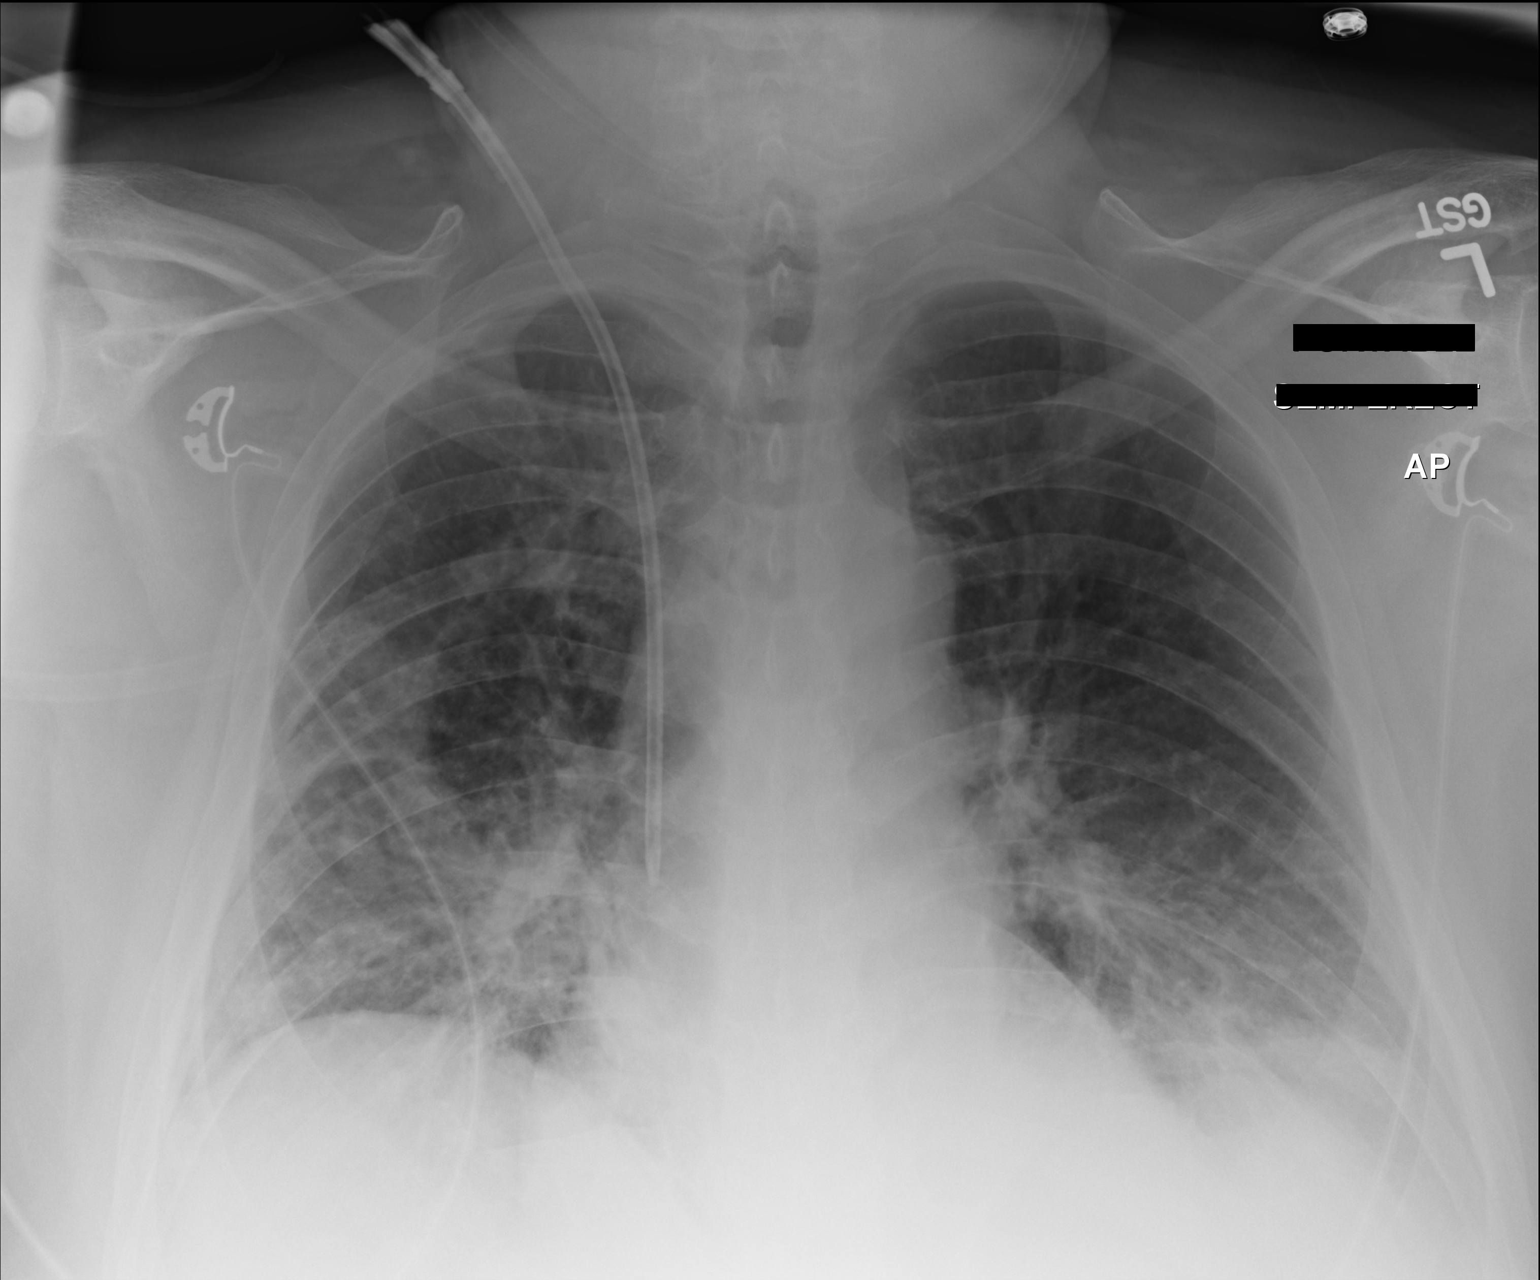

[1 of 1 positions shown; findings below may reference images not displayed]

FINDINGS: Central venous catheter tip in the lower SVC. There are increased
parenchymal densities at both lung bases. Again noted is a vague
nodular density in the right mid lung. Heart size is normal and
stable. The trachea is midline. No evidence for a pneumothorax.
IMPRESSION: Increased parenchymal densities in the lower lungs bilaterally.
Again noted is a nodular density in the right mid lung.

## 2013-11-27 MED ORDER — POTASSIUM CHLORIDE 10 MEQ/100ML IV SOLN: 10.0000 meq | INTRAVENOUS | Status: DC

## 2013-11-27 MED ORDER — INSULIN ASPART 100 UNIT/ML ~~LOC~~ SOLN: 0.0000 [IU] | Freq: Three times a day (TID) | SUBCUTANEOUS | Status: DC

## 2013-11-27 MED ORDER — PREDNISONE 20 MG PO TABS
40.0000 mg | ORAL_TABLET | Freq: Every day | ORAL | Status: DC
  Administered 2013-11-27 – 2013-11-28 (×2): 40 mg via ORAL
  Filled 2013-11-27 (×3): qty 2

## 2013-11-27 MED ORDER — INSULIN GLARGINE 100 UNIT/ML ~~LOC~~ SOLN
45.0000 [IU] | Freq: Every day | SUBCUTANEOUS | Status: DC
  Administered 2013-11-28 – 2013-11-30 (×3): 45 [IU] via SUBCUTANEOUS
  Filled 2013-11-27 (×3): qty 0.45

## 2013-11-27 MED ORDER — POTASSIUM CHLORIDE CRYS ER 20 MEQ PO TBCR
40.0000 meq | EXTENDED_RELEASE_TABLET | Freq: Four times a day (QID) | ORAL | Status: DC
  Administered 2013-11-27: 40 meq via ORAL
  Filled 2013-11-27: qty 2

## 2013-11-27 MED ORDER — INSULIN ASPART 100 UNIT/ML ~~LOC~~ SOLN: 0.0000 [IU] | Freq: Every day | SUBCUTANEOUS | Status: DC

## 2013-11-27 MED ORDER — ALBUTEROL SULFATE (2.5 MG/3ML) 0.083% IN NEBU
2.5000 mg | INHALATION_SOLUTION | Freq: Three times a day (TID) | RESPIRATORY_TRACT | Status: DC
  Administered 2013-11-27 – 2013-11-30 (×11): 2.5 mg via RESPIRATORY_TRACT
  Filled 2013-11-27 (×12): qty 3

## 2013-11-27 MED ORDER — INSULIN ASPART 100 UNIT/ML ~~LOC~~ SOLN
0.0000 [IU] | Freq: Every day | SUBCUTANEOUS | Status: DC
  Administered 2013-11-27 – 2013-11-28 (×2): 2 [IU] via SUBCUTANEOUS
  Administered 2013-11-29: 3 [IU] via SUBCUTANEOUS

## 2013-11-27 MED ORDER — OXYMETAZOLINE HCL 0.05 % NA SOLN
1.0000 | Freq: Two times a day (BID) | NASAL | Status: DC
Start: 2013-11-27 — End: 2013-11-30
  Administered 2013-11-27 – 2013-11-30 (×8): 1 via NASAL
  Filled 2013-11-27: qty 15

## 2013-11-27 MED ORDER — INSULIN ASPART 100 UNIT/ML ~~LOC~~ SOLN
0.0000 [IU] | Freq: Three times a day (TID) | SUBCUTANEOUS | Status: DC
  Administered 2013-11-27 – 2013-11-28 (×3): 4 [IU] via SUBCUTANEOUS
  Administered 2013-11-28: 15 [IU] via SUBCUTANEOUS
  Administered 2013-11-28: 7 [IU] via SUBCUTANEOUS
  Administered 2013-11-29 (×2): 11 [IU] via SUBCUTANEOUS
  Administered 2013-11-29: 7 [IU] via SUBCUTANEOUS
  Administered 2013-11-30: 11 [IU] via SUBCUTANEOUS
  Administered 2013-11-30: 7 [IU] via SUBCUTANEOUS

## 2013-11-27 NOTE — Progress Notes (Signed)
Name: Kenneth Patrick MRN: [id number] DOB: June 24, 1951    ADMISSION DATE:  11/24/2013 CONSULTATION DATE:  11/24/2013  REFERRING MD :  [facility] PRIMARY SERVICE: PCCM  CHIEF COMPLAINT:  Shortness of breath  BRIEF PATIENT DESCRIPTION: 62 M with COPD who presented to OSH with hypoxic respiratory failure and metabolic acidosis in the setting of AKI. Has been feeling poorly over the past several weeks with increased productive cough and shortness of breath.  SIGNIFICANT EVENTS / STUDIES:  ABG at [facility] 7.14/32/54/10.9 on 2L --> 7.12/35/106/12.5 on BiPAP  BNP 684 at [facility] 5.03 2/21- remains acidotic 2/21 renal US>>>no hydronephrosis 2/21 CT chest>>>bilateral lower lobe bronchiectasis, multiple tree in bud nodules in right lung compatible with infectious or inflammatory process, potential septic emboli 2/21 acidosis improved on bicarb and with bipap 2/22- no distress, no fevers, bipap off, no hd required 2/22 echo>>>EF 87-86%, grade 1 diastolic dysfunction, no comment on thrombi  LINES / TUBES: PIV 2/20>> Right HD cath 2/21>>  CULTURES: UCx 2/21>>neg BCx 2/21>>ng Viral panel 2/21>>  ANTIBIOTICS: Levaquin 2/20 >> Vanc 2/21>>2/23 Aztreonam 2/2>>  SUBJECTIVE: got congested overnight, started back on afrin which is a home med, tolerated CPAP for 15 minutes. Nursing reports that patient has had loss of 20 lbs recently without trying and has had diminished appetite during this time. Needs 4 L [place]  VITAL SIGNS: Temp:  [97.3 F (36.3 C)-98.4 F (36.9 C)] 98 F (36.7 C) (02/23 0400) Pulse Rate:  [92-103] 101 (02/23 0600) Resp:  [15-24] 20 (02/23 0600) BP: (101-140)/(47-65) 121/63 mmHg (02/23 0600) SpO2:  [87 %-94 %] 91 % (02/23 0600) Weight:  [331 lb 5.6 oz (150.3 kg)] 331 lb 5.6 oz (150.3 kg) (02/23 0400) HEMODYNAMICS: CVP:  [9 mmHg-12 mmHg] 12 mmHg VENTILATOR SETTINGS:   INTAKE / OUTPUT: Intake/Output     02/22 0701 - 02/23 0700   P.O. 560   I.V.  (mL/kg) 2671.8 (17.8)   IV Piggyback 1400   Total Intake(mL/kg) 4631.8 (30.8)   Urine (mL/kg/hr) 2675 (0.7)   Total Output 2675   Net +1956.8         PHYSICAL EXAMINATION: General: Obese male in NAD Neuro: alert, appropriately answering questions, oriented x3 HEENT: [place] in place Cardiovascular: rrr, no murmur appreciated Lungs: Crackles bilaterally improved Abdomen: Obese, NT +BS  Musculoskeletal: no edema in BLE  Skin: Intact   LABS:  CBC  Recent Labs Lab 11/25/13 0200 11/25/13 0340 11/26/13 0520  WBC 16.0* 13.5* 10.7*  HGB 9.5* 10.0* 9.1*  HCT 29.4* 30.4* 26.7*  PLT 309 349 307   Coag's  Recent Labs Lab 11/25/13 0200  INR 1.16   BMET  Recent Labs Lab 11/26/13 1237 11/26/13 1756 11/27/13 0515  NA 139 138 139  K 3.8 3.5* 3.5*  CL 99 97 101  CO2 18* 20 22  BUN 84* 82* 74*  CREATININE 4.21* 3.88* 2.91*  GLUCOSE 175* 205* 167*   Electrolytes  Recent Labs Lab 11/25/13 0200 11/25/13 0340  11/26/13 1237 11/26/13 1756 11/27/13 0515  CALCIUM 8.3* 8.6  < > 7.4* 7.1* 7.1*  MG 2.1 2.2  --   --   --   --   PHOS 4.7* 5.1*  --   --   --   --   < > = values in this interval not displayed. Sepsis Markers  Recent Labs Lab 11/25/13 0200 11/25/13 0558 11/25/13 1224  LATICACIDVEN 0.9 1.0 1.1  PROCALCITON 2.50  --   --    ABG  Recent Labs Lab 11/25/13 0114 11/25/13 0554 11/25/13 1712  PHART 7.140* 7.168* 7.305*  PCO2ART 37.0 34.9* 32.3*  PO2ART 91.0 61.0* 75.0*   Liver Enzymes  Recent Labs Lab 11/25/13 0200  AST 20  ALT 22  ALKPHOS 127*  BILITOT <0.2*  ALBUMIN 2.2*   Cardiac Enzymes  Recent Labs Lab 11/25/13 0200 11/25/13 0602 11/25/13 1224  TROPONINI <0.30 <0.30 <0.30  PROBNP 199.5*  --   --    Glucose  Recent Labs Lab 11/26/13 0959 11/26/13 1228 11/26/13 1615 11/26/13 1956 11/27/13 0008 11/27/13 0410  GLUCAP 135* 160* 184* 227* 174* 157*    Imaging Ct Chest Wo Contrast  11/25/2013   CLINICAL DATA:  Worsening  shortness of breath.  Respiratory failure.  EXAM: CT CHEST WITHOUT CONTRAST  TECHNIQUE: Multidetector CT imaging of the chest was performed following the standard protocol without IV contrast.  COMPARISON:  01/15/2012  FINDINGS: No pleural effusion identified. Airspace consolidation and atelectasis is noted within the posterior right lower lobe.  There are multiple tree-in-bud nodules identified throughout the right upper lobe, right lower lobe and right middle lobe. Solid nodule with surrounding ground-glass attenuation is identified in the right upper lobe measuring 1.8 cm, image 20/series 4. Similarly there is a left lower lobe, solid nodule measuring 1.5 cm, image 38/series 4. Bronchiectasis is identified within both lower lobes, left greater than right. A few peripheral nodules may have central cavitation. For example, within the periphery of the right middle lobe there is a 1.6 cm nodule, image 28/ series 4. Within the left lower lobe there is a 1.2 cm cavitary nodule.  The trachea appears patent and is midline. The heart size appears normal. Calcified stress set partially calcified right paratracheal and sub- carinal lymph nodes are identified compatible with prior granulomatous disease. Noncalcified right paratracheal lymph node measures 8 mm, image 13/series 3. Noncalcified sub- carinal lymph node is enlarged measuring 1.1 cm. Calcified atherosclerotic disease also involves the thoracic aorta as well as the LAD and RCA coronary arteries.  No axillary or supraclavicular adenopathy. Incidental imaging through the upper abdomen is unremarkable.  Review of the visualized bony structures is on unremarkable.  IMPRESSION: 1. Multiple abnormal findings are identified in both lungs which are likely inflammatory or infectious in etiology. 2. Bilateral lower lobe bronchiectasis left-greater-than-right is favored to be the sequela of chronic aspiration. 3. Multiple tree in bud nodules are scattered throughout the right  lung compatible with inflammatory or infectious bronchiolitis. 4. Multi focal nodules with either central cavitation and or peripheral ground-glass attenuation are noted. Differential considerations include septic emboli as well as fungal infection. 5. A few calcified mediastinal lymph nodes are identified compatible with prior granulomatous disease. 6. Atherosclerotic disease including coronary artery calcifications.   Electronically Signed   By: Kerby Moors M.D.   On: 11/25/2013 16:11   US Renal  11/25/2013   CLINICAL DATA:  Elevated creatinine, diabetes  EXAM: RENAL/URINARY TRACT ULTRASOUND COMPLETE  COMPARISON:  None.  FINDINGS: Right Kidney:  Length: 14.1 cm. Echogenicity within normal limits. No mass or hydronephrosis visualized.  Left Kidney:  Length: 13.1 cm. Echogenicity within normal limits. No mass or hydronephrosis visualized.  Bladder:  Decompressed by Foley catheter  IMPRESSION: No acute finding by ultrasound.  Negative for hydronephrosis.   Electronically Signed   By: Daryll Brod M.D.   On: 11/25/2013 09:13   Dg Chest Port 1 View  11/25/2013   CLINICAL DATA:  Right jugular dialysis catheter placement  EXAM: PORTABLE CHEST -  1 VIEW  COMPARISON:  Portable exam 1238 hr compared to 11/25/2013 at 0021 hr  FINDINGS: New right jugular central venous catheter with tip projecting over mid SVC.  Normal heart size and mediastinal contours.  Pulmonary vascular congestion.  Improved interstitial infiltrates since previous exam.  Bibasilar atelectasis.  No definite pleural effusion or pneumothorax.  IMPRESSION: Bibasilar atelectasis.  No pneumothorax following right jugular line placement.  The rapid improvement an infiltrates since the earlier study of 11/25/2013 suggests improved edema.   Electronically Signed   By: Lavonia Dana M.D.   On: 11/25/2013 13:12   CXR interval increase in bibasilar infiltrates vs edema   ASSESSMENT / PLAN:  PULMONARY  A/P: Acute hypoxic respiratory  failure OSA COPD PNA -RLL with bronchiectasis (new compared to CT 2013) ?Septic emboli Supplemental O2 to maintain sat >= 92%, dropping sats this am, will check abg  Chest CT with ?septic emboli / fungal? / PNA/ vasculitis - TTE neg for thrombi - likely is PNA Follow auto immune work up results to final Antibiotics per above pcxr in am  Prednisone 40 mg today due to wheezing on exam Albuterol nebs Continue symbicort  CARDIOVASCULAR  A/P: hypovolemia likely Hypotension: ?related to septic emboli - improved Monitor BP   Doubt need for TEE  D/c fluids and encourage PO intake Remove HD cath  RENAL  A/P:  AKI: likely related to sepsis in combination with ACE/ibuproen/metformin use Metabolic acidosis anion gap: Likely 2/2 AKI. Improving Elevated ESR FeNa 0.5% would argue for prerenal Hypokalemia Renal consult - appreciate their assistance Renal US - normal BMETs daily Hold home metformin F/u vasculitis work-up Replete K PO Allow pos balance F/u SPEP/UPEP  GASTROINTESTINAL  A/P: No acute issues carb mod/renal diet ppi if shock noted or vent  HEMATOLOGIC  A/P: Anemia - stable monitor CBC NO LOVENOX Sub q hep  INFECTIOUS  A/P: PNA ?septic emboli - less likely Vanc, azactam, levaquin - will take off vanc today after touching base with ID F/u Blood, Sputum cultures and RVP No TEE at this time as this is likely PNA  ID consult - appreciate their help  ENDOCRINE  A/P: DM lantus 45 u SSI  cbgs improved -expect to rise again with pred  NEUROLOGIC  A/P: No acute issues Avoid benzo  Tommi Rumps, MD [place] PGY-2  TODAY'S SUMMARY: continue antibiotics, monitor renal status, transfer to tele   Independently examined pt, evaluated data & formulated above care plan with resident who scribed this note & edited by me.  ALVA,RAKESH V.  [phone]

## 2013-11-27 NOTE — Progress Notes (Signed)
Patient has a CPAP unit in his room that he has refused to use for tonight. Patient states that the machine dries out his sinuses. Patient is aware to call RT if he does change his mind and wants to try the machine. RT will continue to assist as needed.

## 2013-11-27 NOTE — Progress Notes (Signed)
UR Completed.  Vergie Living [phone] 11/27/2013

## 2013-11-27 NOTE — Progress Notes (Signed)
[facility] ROUNDING NOTE   Subjective:   Interval History: great urine output  Objective:  Vital signs in last 24 hours:  Temp:  [97.3 F (36.3 C)-98.4 F (36.9 C)] 98.1 F (36.7 C) (02/23 0810) Pulse Rate:  [94-116] 106 (02/23 0900) Resp:  [15-24] 24 (02/23 0900) BP: (101-140)/(47-66) 123/64 mmHg (02/23 0900) SpO2:  [86 %-94 %] 89 % (02/23 0900) FiO2 (%):  [4 %] 4 % (02/23 0900) Weight:  [150.3 kg (331 lb 5.6 oz)] 150.3 kg (331 lb 5.6 oz) (02/23 0400)  Weight change: 0.3 kg (10.6 oz) Filed Weights   11/25/13 0430 11/26/13 0400 11/27/13 0400  Weight: 150.2 kg (331 lb 2.1 oz) 150 kg (330 lb 11 oz) 150.3 kg (331 lb 5.6 oz)    Intake/Output: I/O last 3 completed shifts: In: 7069.8 [P.O.:800; I.V.:4269.8; IV Piggyback:2000] Out: 4098 [Urine:4325]   Intake/Output this shift:  Total I/O In: 370 [P.O.:120; I.V.:250] Out: -   CVS- RRR RS- CTA ABD- BS present soft non-distended EXT- no edema   Basic Metabolic Panel:  Recent Labs Lab 11/25/13 0200 11/25/13 0340  11/26/13 0001 11/26/13 0520 11/26/13 1237 11/26/13 1756 11/27/13 0515  NA 129* 132*  < > 134* 136* 139 138 139  K 5.8* 6.0*  < > 3.5* 3.5* 3.8 3.5* 3.5*  CL 96 97  < > 95* 97 99 97 101  CO2 11* 12*  < > 18* 19 18* 20 22  GLUCOSE 373* 405*  < > 171* 141* 175* 205* 167*  BUN 76* 80*  < > 86* 86* 84* 82* 74*  CREATININE 5.07* 5.13*  < > 4.72* 4.53* 4.21* 3.88* 2.91*  CALCIUM 8.3* 8.6  < > 7.7* 7.4* 7.4* 7.1* 7.1*  MG 2.1 2.2  --   --   --   --   --   --   PHOS 4.7* 5.1*  --   --   --   --   --   --   < > = values in this interval not displayed.  Liver Function Tests:  Recent Labs Lab 11/25/13 0200  AST 20  ALT 22  ALKPHOS 127*  BILITOT <0.2*  PROT 7.0  ALBUMIN 2.2*   No results found for this basename: LIPASE, AMYLASE,  in the last 168 hours No results found for this basename: AMMONIA,  in the last 168 hours  CBC:  Recent Labs Lab 11/25/13 0200 11/25/13 0340  11/26/13 0520  WBC 16.0* 13.5* 10.7*  NEUTROABS 14.8*  --   --   HGB 9.5* 10.0* 9.1*  HCT 29.4* 30.4* 26.7*  MCV 88.3 86.9 84.8  PLT 309 349 307    Cardiac Enzymes:  Recent Labs Lab 11/25/13 0200 11/25/13 0602 11/25/13 1224  TROPONINI <0.30 <0.30 <0.30    BNP: No components found with this basename: POCBNP,   CBG:  Recent Labs Lab 11/26/13 1615 11/26/13 1956 11/27/13 0008 11/27/13 0410 11/27/13 0748  GLUCAP 184* 227* 174* 157* 169*    Microbiology: Results for orders placed during the hospital encounter of 11/24/13  MRSA PCR SCREENING     Status: None   Collection Time    11/24/13 11:42 PM      Result Value Ref Range Status   MRSA by PCR NEGATIVE  NEGATIVE Final   Comment:            The GeneXpert MRSA Assay (FDA     approved for NASAL specimens     only), is one component of a  comprehensive MRSA colonization     surveillance program. It is not     intended to diagnose MRSA     infection nor to guide or     monitor treatment for     MRSA infections.  CULTURE, BLOOD (ROUTINE X 2)     Status: None   Collection Time    11/25/13  1:54 AM      Result Value Ref Range Status   Specimen Description BLOOD RIGHT ARM   Final   Special Requests BOTTLES DRAWN AEROBIC ONLY 2CC   Final   Culture  Setup Time     Final   Value: 11/25/2013 12:01     Performed at [facility]   Culture     Final   Value:        BLOOD CULTURE RECEIVED NO GROWTH TO DATE CULTURE WILL BE HELD FOR 5 DAYS BEFORE ISSUING A FINAL NEGATIVE REPORT     Performed at [facility]   Report Status PENDING   Incomplete  CULTURE, BLOOD (ROUTINE X 2)     Status: None   Collection Time    11/25/13  2:00 AM      Result Value Ref Range Status   Specimen Description BLOOD RIGHT HAND   Final   Special Requests BOTTLES DRAWN AEROBIC ONLY 10CC   Final   Culture  Setup Time     Final   Value: 11/25/2013 12:01     Performed at [facility]   Culture     Final   Value:         BLOOD CULTURE RECEIVED NO GROWTH TO DATE CULTURE WILL BE HELD FOR 5 DAYS BEFORE ISSUING A FINAL NEGATIVE REPORT     Performed at [facility]   Report Status PENDING   Incomplete  URINE CULTURE     Status: None   Collection Time    11/25/13  2:19 AM      Result Value Ref Range Status   Specimen Description URINE, CATHETERIZED   Final   Special Requests NONE   Final   Culture  Setup Time     Final   Value: 11/25/2013 12:13     Performed at [facility] Count     Final   Value: NO GROWTH     Performed at [facility]   Culture     Final   Value: NO GROWTH     Performed at [facility]   Report Status 11/26/2013 FINAL   Final    Coagulation Studies:  Recent Labs  11/25/13 0200  LABPROT 14.6  INR 1.16    Urinalysis:  Recent Labs  11/25/13 0219 11/25/13 1215  COLORURINE AMBER* YELLOW  LABSPEC 1.027 1.016  PHURINE 5.0 5.0  GLUCOSEU 500* 500*  HGBUR LARGE* MODERATE*  BILIRUBINUR SMALL* NEGATIVE  KETONESUR NEGATIVE NEGATIVE  PROTEINUR 100* NEGATIVE  UROBILINOGEN 0.2 0.2  NITRITE NEGATIVE NEGATIVE  LEUKOCYTESUR SMALL* NEGATIVE      Imaging: Ct Chest Wo Contrast  11/25/2013   CLINICAL DATA:  Worsening shortness of breath.  Respiratory failure.  EXAM: CT CHEST WITHOUT CONTRAST  TECHNIQUE: Multidetector CT imaging of the chest was performed following the standard protocol without IV contrast.  COMPARISON:  01/15/2012  FINDINGS: No pleural effusion identified. Airspace consolidation and atelectasis is noted within the posterior right lower lobe.  There are multiple tree-in-bud nodules identified throughout the right upper lobe, right lower lobe and right middle lobe.  Solid nodule with surrounding ground-glass attenuation is identified in the right upper lobe measuring 1.8 cm, image 20/series 4. Similarly there is a left lower lobe, solid nodule measuring 1.5 cm, image 38/series 4. Bronchiectasis is identified within both lower lobes,  left greater than right. A few peripheral nodules may have central cavitation. For example, within the periphery of the right middle lobe there is a 1.6 cm nodule, image 28/ series 4. Within the left lower lobe there is a 1.2 cm cavitary nodule.  The trachea appears patent and is midline. The heart size appears normal. Calcified stress set partially calcified right paratracheal and sub- carinal lymph nodes are identified compatible with prior granulomatous disease. Noncalcified right paratracheal lymph node measures 8 mm, image 13/series 3. Noncalcified sub- carinal lymph node is enlarged measuring 1.1 cm. Calcified atherosclerotic disease also involves the thoracic aorta as well as the LAD and RCA coronary arteries.  No axillary or supraclavicular adenopathy. Incidental imaging through the upper abdomen is unremarkable.  Review of the visualized bony structures is on unremarkable.  IMPRESSION: 1. Multiple abnormal findings are identified in both lungs which are likely inflammatory or infectious in etiology. 2. Bilateral lower lobe bronchiectasis left-greater-than-right is favored to be the sequela of chronic aspiration. 3. Multiple tree in bud nodules are scattered throughout the right lung compatible with inflammatory or infectious bronchiolitis. 4. Multi focal nodules with either central cavitation and or peripheral ground-glass attenuation are noted. Differential considerations include septic emboli as well as fungal infection. 5. A few calcified mediastinal lymph nodes are identified compatible with prior granulomatous disease. 6. Atherosclerotic disease including coronary artery calcifications.   Electronically Signed   By: Kerby Moors M.D.   On: 11/25/2013 16:11   Dg Chest Port 1 View  11/27/2013   CLINICAL DATA:  Septic emboli and shortness of breath.  EXAM: PORTABLE CHEST - 1 VIEW  COMPARISON:  DG CHEST 1V PORT dated 11/25/2013; CT CHEST W/O CM dated 11/25/2013  FINDINGS: Central venous catheter tip in  the lower SVC. There are increased parenchymal densities at both lung bases. Again noted is a vague nodular density in the right mid lung. Heart size is normal and stable. The trachea is midline. No evidence for a pneumothorax.  IMPRESSION: Increased parenchymal densities in the lower lungs bilaterally. Again noted is a nodular density in the right mid lung.   Electronically Signed   By: Markus Daft M.D.   On: 11/27/2013 07:35   Dg Chest Port 1 View  11/25/2013   CLINICAL DATA:  Right jugular dialysis catheter placement  EXAM: PORTABLE CHEST - 1 VIEW  COMPARISON:  Portable exam 1238 hr compared to 11/25/2013 at 0021 hr  FINDINGS: New right jugular central venous catheter with tip projecting over mid SVC.  Normal heart size and mediastinal contours.  Pulmonary vascular congestion.  Improved interstitial infiltrates since previous exam.  Bibasilar atelectasis.  No definite pleural effusion or pneumothorax.  IMPRESSION: Bibasilar atelectasis.  No pneumothorax following right jugular line placement.  The rapid improvement an infiltrates since the earlier study of 11/25/2013 suggests improved edema.   Electronically Signed   By: Lavonia Dana M.D.   On: 11/25/2013 13:12     Medications:   . dextrose    . dextrose 20 mL/hr at 11/25/13 2213  .  sodium bicarbonate  infusion 1000 mL 125 mL/hr at 11/27/13 0848   . albuterol  2.5 mg Nebulization TID  . antiseptic oral rinse  15 mL Mouth Rinse q12n4p  . aztreonam  1 g Intravenous 3 times per day  . budesonide-formoterol  2 puff Inhalation BID  . chlorhexidine  15 mL Mouth Rinse BID  . gabapentin  300 mg Oral BID  . heparin  5,000 Units Subcutaneous 3 times per day  . insulin aspart  0-15 Units Subcutaneous 6 times per day  . insulin glargine  35 Units Subcutaneous Q24H  . levofloxacin (LEVAQUIN) IV  750 mg Intravenous Q48H  . oxymetazoline  1 spray Each Nare BID  . pneumococcal 23 valent vaccine  0.5 mL Intramuscular Tomorrow-1000  . potassium chloride  40  mEq Oral Q6H  . sertraline  200 mg Oral Daily  . vancomycin  2,000 mg Intravenous Q24H   albuterol, dextrose  Assessment/ Plan:  Pt is a 63yo M with PMH sig for COPD, DM, HTN, who presented to [facility] hospital with worsening SOB and was transferred to [facility] due to hypoxic respiratory failure and metabolic acidosis in the setting of AKI. Unknown baseline Scr, however the patient had been taking NSAIDs (12 ibuprofen's daily for months) along with ACE-I and metformin PTA.   1. Acute renal failure with great diuresis and renal function improved creatinine now decreased to 2.9   2. Great diuresis urine output 3 L  3. Anemia controlled  SPEP and UPEP  Still in process. T sats 23 %     I anticipate recovery  Discussed with Dr Dagmar Hait  Will sign off and have asked that he follow up on SPEP and UPEP     LOS: 3 Bradlee Bridgers W @TODAY @9 :29 AM

## 2013-11-27 NOTE — Progress Notes (Signed)
Inpatient Diabetes Program Recommendations  AACE/ADA: New Consensus Statement on Inpatient Glycemic Control (2013)  Target Ranges:  Prepandial:   less than 140 mg/dL      Peak postprandial:   less than 180 mg/dL (1-2 hours)      Critically ill patients:  140 - 180 mg/dL   Reason for Visit: Results for STEEN, BISIG (MRN [id number]) as of 11/27/2013 10:10  Ref. Range 11/26/2013 16:15 11/26/2013 19:56 11/27/2013 00:08 11/27/2013 04:10 11/27/2013 07:48  Glucose-Capillary Latest Range: 70-99 mg/dL 184 (H) 227 (H) 174 (H) 157 (H) 169 (H)  Diabetes history: Type 2 diabetes Outpatient Diabetes medications: Levemir 70 units bid, Novolog 20 units tid with meals, Victoza 1.8 mg daily, Metformin 1000 mg bid  Current orders for Inpatient glycemic control: Patient received Lantus 35 units this AM however insulins now discontinued.    Please restart Lantus 35 units daily and Moderate Novolog correction.  Also consider adding Novolog meal coverage 5 units tid with meals.      Adah Perl, RN, BC-ADM Inpatient Diabetes Coordinator Pager [phone]

## 2013-11-27 NOTE — Progress Notes (Signed)
Patient ID: Kenneth Patrick, male   DOB: 12-08-1950, 63 y.o.   MRN: [id number]         [facility] for Infectious Disease    Date of Admission:  11/24/2013           Day 3 vancomycin        Day 3 levofloxacin        Day 3 aztreonam Principal Problem:   Respiratory failure Active Problems:   DM   C O P D   OSA (obstructive sleep apnea)   AKI (acute kidney injury)   Metabolic acidosis   Hypotension, unspecified   Acute respiratory failure with hypoxia   . albuterol  2.5 mg Nebulization TID  . antiseptic oral rinse  15 mL Mouth Rinse q12n4p  . aztreonam  1 g Intravenous 3 times per day  . budesonide-formoterol  2 puff Inhalation BID  . chlorhexidine  15 mL Mouth Rinse BID  . gabapentin  300 mg Oral BID  . heparin  5,000 Units Subcutaneous 3 times per day  . insulin aspart  0-20 Units Subcutaneous TID WC  . insulin aspart  0-5 Units Subcutaneous QHS  . [START ON 11/28/2013] insulin glargine  45 Units Subcutaneous Daily  . levofloxacin (LEVAQUIN) IV  750 mg Intravenous Q48H  . oxymetazoline  1 spray Each Nare BID  . [START ON 11/28/2013] predniSONE  40 mg Oral Q breakfast  . sertraline  200 mg Oral Daily    Subjective: He states that he still feels miserable. He is not bringing up much sputum but he still has a terrible cough and chest pain from coughing so much over the past 3 weeks. He is a retired Higher education careers adviser. He has never had pneumonia before that he knows of. He used to live and [place], [place]. His wife has been sick with a similar respiratory illness but is improved now. He has a history of COPD.  Past Medical History  Diagnosis Date  . Diabetic neuropathy   . Respiratory infection   . Low back pain   . Asthma   . Dyspnea   . Sleep apnea   . Depression   . DM2 (diabetes mellitus, type 2)   . Hypercholesterolemia     History  Substance Use Topics  . Smoking status: Former Smoker -- 1.50 packs/day for 20 years    Quit date: 10/05/2004  . Smokeless tobacco:  Never Used  . Alcohol Use: No    Family History  Problem Relation Age of Onset  . Leukemia Father   . Diabetes Mother     Allergies  Allergen Reactions  . Penicillins     Childhood allergy     Objective: Temp:  [97.3 F (36.3 C)-98.4 F (36.9 C)] 98 F (36.7 C) (02/23 1624) Pulse Rate:  [100-116] 102 (02/23 1400) Resp:  [18-28] 24 (02/23 1400) BP: (101-140)/(47-66) 131/63 mmHg (02/23 1400) SpO2:  [86 %-94 %] 91 % (02/23 1400) FiO2 (%):  [4 %] 4 % (02/23 1400) Weight:  [150.3 kg (331 lb 5.6 oz)] 150.3 kg (331 lb 5.6 oz) (02/23 0400)  General: He looks very uncomfortable.  Skin: Cool and clammy Lungs: Clear anteriorly Cor: Regular S1-S2 without murmur Abdomen: Obese, soft and nontender  Lab Results Lab Results  Component Value Date   WBC 10.7* 11/26/2013   HGB 9.1* 11/26/2013   HCT 26.7* 11/26/2013   MCV 84.8 11/26/2013   PLT 307 11/26/2013    Lab Results  Component Value Date   CREATININE 2.91*  11/27/2013   BUN 74* 11/27/2013   NA 139 11/27/2013   K 3.5* 11/27/2013   CL 101 11/27/2013   CO2 22 11/27/2013    Lab Results  Component Value Date   ALT 22 11/25/2013   AST 20 11/25/2013   ALKPHOS 127* 11/25/2013   BILITOT <0.2* 11/25/2013      Microbiology: Recent Results (from the past 240 hour(s))  MRSA PCR SCREENING     Status: None   Collection Time    11/24/13 11:42 PM      Result Value Ref Range Status   MRSA by PCR NEGATIVE  NEGATIVE Final   Comment:            The GeneXpert MRSA Assay (FDA     approved for NASAL specimens     only), is one component of a     comprehensive MRSA colonization     surveillance program. It is not     intended to diagnose MRSA     infection nor to guide or     monitor treatment for     MRSA infections.  CULTURE, BLOOD (ROUTINE X 2)     Status: None   Collection Time    11/25/13  1:54 AM      Result Value Ref Range Status   Specimen Description BLOOD RIGHT ARM   Final   Special Requests BOTTLES DRAWN AEROBIC ONLY 2CC    Final   Culture  Setup Time     Final   Value: 11/25/2013 12:01     Performed at [facility]   Culture     Final   Value:        BLOOD CULTURE RECEIVED NO GROWTH TO DATE CULTURE WILL BE HELD FOR 5 DAYS BEFORE ISSUING A FINAL NEGATIVE REPORT     Performed at [facility]   Report Status PENDING   Incomplete  CULTURE, BLOOD (ROUTINE X 2)     Status: None   Collection Time    11/25/13  2:00 AM      Result Value Ref Range Status   Specimen Description BLOOD RIGHT HAND   Final   Special Requests BOTTLES DRAWN AEROBIC ONLY 10CC   Final   Culture  Setup Time     Final   Value: 11/25/2013 12:01     Performed at [facility]   Culture     Final   Value:        BLOOD CULTURE RECEIVED NO GROWTH TO DATE CULTURE WILL BE HELD FOR 5 DAYS BEFORE ISSUING A FINAL NEGATIVE REPORT     Performed at [facility]   Report Status PENDING   Incomplete  URINE CULTURE     Status: None   Collection Time    11/25/13  2:19 AM      Result Value Ref Range Status   Specimen Description URINE, CATHETERIZED   Final   Special Requests NONE   Final   Culture  Setup Time     Final   Value: 11/25/2013 12:13     Performed at [facility]     Final   Value: NO GROWTH     Performed at [facility]   Culture     Final   Value: NO GROWTH     Performed at [facility]   Report Status 11/26/2013 FINAL   Final    Studies/Results: Dg Chest Port 1 View  11/27/2013  CLINICAL DATA:  Septic emboli and shortness of breath.  EXAM: PORTABLE CHEST - 1 VIEW  COMPARISON:  DG CHEST 1V PORT dated 11/25/2013; CT CHEST W/O CM dated 11/25/2013  FINDINGS: Central venous catheter tip in the lower SVC. There are increased parenchymal densities at both lung bases. Again noted is a vague nodular density in the right mid lung. Heart size is normal and stable. The trachea is midline. No evidence for a pneumothorax.  IMPRESSION: Increased parenchymal densities in the lower  lungs bilaterally. Again noted is a nodular density in the right mid lung.   Electronically Signed   By: Markus Daft M.D.   On: 11/27/2013 07:35    Assessment: He has bilateral nodular infiltrates, some with early cavitation complicated by acute renal insufficiency. Blood cultures obtained at [facility], presumably before antibiotics were started, or negative. There was no evidence of endocarditis by transthoracic echocardiogram. Will continue current antibiotic therapy pending final cultures and Histoplasma studies.  Plan: 1. Continue current antibiotics  Michel Bickers, MD [facility] for Infectious [place] [phone] pager   [phone] cell 11/27/2013, 4:42 PM

## 2013-11-28 DIAGNOSIS — B338 Other specified viral diseases: Secondary | ICD-10-CM

## 2013-11-28 DIAGNOSIS — B974 Respiratory syncytial virus as the cause of diseases classified elsewhere: Secondary | ICD-10-CM

## 2013-11-28 DIAGNOSIS — J189 Pneumonia, unspecified organism: Secondary | ICD-10-CM

## 2013-11-28 LAB — BASIC METABOLIC PANEL
BUN: 67 mg/dL — ABNORMAL HIGH (ref 6–23)
CALCIUM: 8.6 mg/dL (ref 8.4–10.5)
CO2: 24 mEq/L (ref 19–32)
Chloride: 103 mEq/L (ref 96–112)
Creatinine, Ser: 1.84 mg/dL — ABNORMAL HIGH (ref 0.50–1.35)
GFR calc Af Amer: 44 mL/min — ABNORMAL LOW (ref >90)
GFR, EST NON AFRICAN AMERICAN: 38 mL/min — AB (ref >90)
Glucose, Bld: 225 mg/dL — ABNORMAL HIGH (ref 70–99)
Potassium: 4.7 mEq/L (ref 3.7–5.3)
SODIUM: 143 meq/L (ref 137–147)

## 2013-11-28 LAB — IMMUNOFIXATION ELECTROPHORESIS
IGA: 233 mg/dL (ref 68–379)
IGG (IMMUNOGLOBIN G), SERUM: 850 mg/dL (ref 650–1600)
IgM, Serum: 39 mg/dL — ABNORMAL LOW (ref 41–251)
Total Protein ELP: 6 g/dL (ref 6.0–8.3)

## 2013-11-28 LAB — UIFE/LIGHT CHAINS/TP QN, 24-HR UR
Albumin, U: DETECTED
Alpha 1, Urine: DETECTED — AB
Alpha 2, Urine: DETECTED — AB
Beta, Urine: DETECTED — AB
FREE LAMBDA LT CHAINS, UR: 2.68 mg/dL — AB (ref 0.02–0.67)
Free Kappa Lt Chains,Ur: 12 mg/dL — ABNORMAL HIGH (ref 0.14–2.42)
Free Kappa/Lambda Ratio: 4.48 ratio (ref 2.04–10.37)
GAMMA UR: DETECTED — AB
Total Protein, Urine: 17.1 mg/dL

## 2013-11-28 LAB — GLUCOSE, CAPILLARY
GLUCOSE-CAPILLARY: 219 mg/dL — AB (ref 70–99)
Glucose-Capillary: 196 mg/dL — ABNORMAL HIGH (ref 70–99)
Glucose-Capillary: 307 mg/dL — ABNORMAL HIGH (ref 70–99)
Glucose-Capillary: 232 mg/dL — ABNORMAL HIGH (ref 70–99)

## 2013-11-28 LAB — C4 COMPLEMENT: COMPLEMENT C4, BODY FLUID: 36 mg/dL (ref 10–40)

## 2013-11-28 LAB — C3 COMPLEMENT: C3 Complement: 236 mg/dL — ABNORMAL HIGH (ref 90–180)

## 2013-11-28 MED ORDER — LEVOFLOXACIN 500 MG PO TABS
500.0000 mg | ORAL_TABLET | ORAL | Status: DC
  Administered 2013-11-28 – 2013-11-30 (×3): 500 mg via ORAL
  Filled 2013-11-28 (×3): qty 1

## 2013-11-28 MED ORDER — PREDNISONE 20 MG PO TABS
30.0000 mg | ORAL_TABLET | Freq: Every day | ORAL | Status: DC
  Administered 2013-11-29 – 2013-11-30 (×2): 30 mg via ORAL
  Filled 2013-11-28 (×3): qty 1

## 2013-11-28 MED ORDER — LEVOFLOXACIN IN D5W 750 MG/150ML IV SOLN
750.0000 mg | INTRAVENOUS | Status: DC
  Administered 2013-11-28: 750 mg via INTRAVENOUS
  Filled 2013-11-28: qty 150

## 2013-11-28 NOTE — Progress Notes (Signed)
Placed pt on cpap for rest.  Initially placed on home settings of 15cm h2o, however pt unable to tolerate this pressure stating it was too much.  Settings adjusted down to 12cm then 10cm per pt comfort with 4l o2 bleed in.  Pt is tolerating well at this time.  Hr100, sats96%.  Sterile water added to max fill line of humidity chamber.

## 2013-11-28 NOTE — Progress Notes (Signed)
Patient ID: Kenneth Patrick, male   DOB: 1950/12/27, 63 y.o.   MRN: [id number]         [facility] for Infectious Disease    Date of Admission:  11/24/2013           Day 3 vancomycin        Day 3 levofloxacin        Day 3 aztreonam Principal Problem:   Respiratory failure Active Problems:   DM   C O P D   OSA (obstructive sleep apnea)   AKI (acute kidney injury)   Metabolic acidosis   Hypotension, unspecified   Acute respiratory failure with hypoxia   . albuterol  2.5 mg Nebulization TID  . antiseptic oral rinse  15 mL Mouth Rinse q12n4p  . aztreonam  1 g Intravenous 3 times per day  . budesonide-formoterol  2 puff Inhalation BID  . chlorhexidine  15 mL Mouth Rinse BID  . gabapentin  300 mg Oral BID  . heparin  5,000 Units Subcutaneous 3 times per day  . insulin aspart  0-20 Units Subcutaneous TID WC  . insulin aspart  0-5 Units Subcutaneous QHS  . insulin glargine  45 Units Subcutaneous Daily  . levofloxacin  500 mg Oral Q24H  . oxymetazoline  1 spray Each Nare BID  . [START ON 11/29/2013] predniSONE  30 mg Oral Q breakfast  . sertraline  200 mg Oral Daily    Subjective: He states he is feeling a little bit better. He has not been tolerating CPAP. He has been losing weight.  Past Medical History  Diagnosis Date  . Diabetic neuropathy   . Respiratory infection   . Low back pain   . Asthma   . Dyspnea   . Sleep apnea   . Depression   . DM2 (diabetes mellitus, type 2)   . Hypercholesterolemia     History  Substance Use Topics  . Smoking status: Former Smoker -- 1.50 packs/day for 20 years    Quit date: 10/05/2004  . Smokeless tobacco: Never Used  . Alcohol Use: No    Family History  Problem Relation Age of Onset  . Leukemia Father   . Diabetes Mother     Allergies  Allergen Reactions  . Penicillins     Childhood allergy     Objective: Temp:  [97.6 F (36.4 C)-98.3 F (36.8 C)] 98 F (36.7 C) (02/24 1431) Pulse Rate:  [99-115] 115 (02/24  1431) Resp:  [20-26] 22 (02/24 1431) BP: (117-166)/(67-80) 138/78 mmHg (02/24 1431) SpO2:  [89 %-94 %] 94 % (02/24 1431) FiO2 (%):  [4 %] 4 % (02/23 1800) Weight:  [321 lb 6.9 oz (145.8 kg)] 321 lb 6.9 oz (145.8 kg) (02/24 0436)  General: He looks more comfortable.  Skin: No rash Lungs: Scattered crackles. No wheezes. Cor: Regular S1-S2 without murmur Abdomen: Obese, soft and nontender  Lab Results Lab Results  Component Value Date   WBC 10.7* 11/26/2013   HGB 9.1* 11/26/2013   HCT 26.7* 11/26/2013   MCV 84.8 11/26/2013   PLT 307 11/26/2013    Lab Results  Component Value Date   CREATININE 1.84* 11/28/2013   BUN 67* 11/28/2013   NA 143 11/28/2013   K 4.7 11/28/2013   CL 103 11/28/2013   CO2 24 11/28/2013    Lab Results  Component Value Date   ALT 22 11/25/2013   AST 20 11/25/2013   ALKPHOS 127* 11/25/2013   BILITOT <0.2* 11/25/2013  Microbiology: Recent Results (from the past 240 hour(s))  MRSA PCR SCREENING     Status: None   Collection Time    11/24/13 11:42 PM      Result Value Ref Range Status   MRSA by PCR NEGATIVE  NEGATIVE Final   Comment:            The GeneXpert MRSA Assay (FDA     approved for NASAL specimens     only), is one component of a     comprehensive MRSA colonization     surveillance program. It is not     intended to diagnose MRSA     infection nor to guide or     monitor treatment for     MRSA infections.  RESPIRATORY VIRUS PANEL     Status: Abnormal   Collection Time    11/25/13 12:50 AM      Result Value Ref Range Status   Source - RVPAN NASAL SWAB   Corrected   Comment: CORRECTED ON 02/23 AT 1927: PREVIOUSLY REPORTED AS NASAL SWAB   Respiratory Syncytial Virus A NOT DETECTED   Final   Respiratory Syncytial Virus B DETECTED (*)  Final   Influenza A NOT DETECTED   Final   Influenza B NOT DETECTED   Final   Parainfluenza 1 NOT DETECTED   Final   Parainfluenza 2 NOT DETECTED   Final   Parainfluenza 3 NOT DETECTED   Final   Metapneumovirus  NOT DETECTED   Final   Rhinovirus NOT DETECTED   Final   Adenovirus NOT DETECTED   Final   Influenza A H1 NOT DETECTED   Final   Influenza A H3 NOT DETECTED   Final   Comment: (NOTE)           Normal Reference Range for each Analyte: NOT DETECTED     Testing performed using the Luminex xTAG Respiratory Viral Panel test     kit.     This test was developed and its performance characteristics determined     by [facility]. It has not been cleared or approved by the [place]     Food and Drug Administration. This test is used for clinical purposes.     It should not be regarded as investigational or for research. This     laboratory is certified under the [facility] (CLIA) as qualified to perform high complexity     clinical laboratory testing.     Performed at [place] [facility], BLOOD (ROUTINE X 2)     Status: None   Collection Time    11/25/13  1:54 AM      Result Value Ref Range Status   Specimen Description BLOOD RIGHT ARM   Final   Special Requests BOTTLES DRAWN AEROBIC ONLY 2CC   Final   Culture  Setup Time     Final   Value: 11/25/2013 12:01     Performed at [facility]   Culture     Final   Value:        BLOOD CULTURE RECEIVED NO GROWTH TO DATE CULTURE WILL BE HELD FOR 5 DAYS BEFORE ISSUING A FINAL NEGATIVE REPORT     Performed at [facility]   Report Status PENDING   Incomplete  CULTURE, BLOOD (ROUTINE X 2)     Status: None   Collection Time    11/25/13  2:00 AM  Result Value Ref Range Status   Specimen Description BLOOD RIGHT HAND   Final   Special Requests BOTTLES DRAWN AEROBIC ONLY 10CC   Final   Culture  Setup Time     Final   Value: 11/25/2013 12:01     Performed at [facility]   Culture     Final   Value:        BLOOD CULTURE RECEIVED NO GROWTH TO DATE CULTURE WILL BE HELD FOR 5 DAYS BEFORE ISSUING A FINAL NEGATIVE REPORT     Performed at [facility]   Report Status  PENDING   Incomplete  URINE CULTURE     Status: None   Collection Time    11/25/13  2:19 AM      Result Value Ref Range Status   Specimen Description URINE, CATHETERIZED   Final   Special Requests NONE   Final   Culture  Setup Time     Final   Value: 11/25/2013 12:13     Performed at [facility]     Final   Value: NO GROWTH     Performed at [facility]   Culture     Final   Value: NO GROWTH     Performed at [facility]   Report Status 11/26/2013 FINAL   Final    Studies/Results: Dg Chest Port 1 View  11/27/2013   CLINICAL DATA:  Septic emboli and shortness of breath.  EXAM: PORTABLE CHEST - 1 VIEW  COMPARISON:  DG CHEST 1V PORT dated 11/25/2013; CT CHEST W/O CM dated 11/25/2013  FINDINGS: Central venous catheter tip in the lower SVC. There are increased parenchymal densities at both lung bases. Again noted is a vague nodular density in the right mid lung. Heart size is normal and stable. The trachea is midline. No evidence for a pneumothorax.  IMPRESSION: Increased parenchymal densities in the lower lungs bilaterally. Again noted is a nodular density in the right mid lung.   Electronically Signed   By: Markus Daft M.D.   On: 11/27/2013 07:35    Assessment: Sputum PCR is positive for respiratory syncytial virus. This may have been the trigger for his pneumonia but is certainly not the cause of his bilateral nodular infiltrates. RSV is unlikely to cause pneumonia and adult. He will need to stay on isolation including contact precautions. Agree with continuing broad empiric antibiotic therapy for probable bacterial pneumonia.  Plan: 1. Continue current antibiotics  Michel Bickers, MD [facility] for [place] [phone] pager   [phone] cell 11/28/2013, 2:37 PM

## 2013-11-28 NOTE — Evaluation (Signed)
Physical Therapy Evaluation Patient Details Name: Kenneth Patrick MRN: [id number] DOB: Feb 17, 1951 Today's Date: 11/28/2013 Time: 1450-1510 PT Time Calculation (min): 20 min  PT Assessment / Plan / Recommendation History of Present Illness  48 M with COPD who presented to OSH with hypoxic respiratory failure and metabolic acidosis in the setting of AKI. Pt with PNA and RSV.  Clinical Impression  Pt admitted with above. Pt currently with functional limitations due to the deficits listed below (see PT Problem List). Expect pt will make good progress and return home. Pt will benefit from skilled PT to increase their independence and safety with mobility to allow discharge to the venue listed below.       PT Assessment  Patient needs continued PT services    Follow Up Recommendations  No PT follow up    Does the patient have the potential to tolerate intense rehabilitation      Barriers to Discharge        Equipment Recommendations  None recommended by PT    Recommendations for Other Services     Frequency Min 3X/week    Precautions / Restrictions Precautions Precautions: Fall   Pertinent Vitals/Pain Pt on 5L of O2. HR 121 with amb. HR 113 at rest.      Mobility  Transfers Overall transfer level: Needs assistance Equipment used: None Transfers: Sit to/from Stand Sit to Stand: Min guard General transfer comment: Pt uses rocking momentum to get up from bed. Ambulation/Gait Ambulation/Gait assistance: Min assist Ambulation Distance (Feet): 150 Feet Assistive device:  (IV pole) Gait Pattern/deviations: Step-through pattern;Decreased step length - right;Decreased step length - left Gait velocity interpretation: Below normal speed for age/gender General Gait Details: slightly unsteady but no overt loss of balance.    Exercises     PT Diagnosis: Difficulty walking;Generalized weakness  PT Problem List: Decreased strength;Decreased activity tolerance;Decreased  balance;Decreased mobility PT Treatment Interventions: DME instruction;Gait training;Patient/family education;Functional mobility training;Therapeutic activities;Therapeutic exercise;Balance training     PT Goals(Current goals can be found in the care plan section) Acute Rehab PT Goals Patient Stated Goal: return home PT Goal Formulation: With patient Time For Goal Achievement: 12/05/13 Potential to Achieve Goals: Good  Visit Information  Last PT Received On: 11/28/13 Assistance Needed: +1 History of Present Illness: 65 M with COPD who presented to OSH with hypoxic respiratory failure and metabolic acidosis in the setting of AKI. Pt with PNA and RSV.       Prior [place] expects to be discharged to:: Private residence Living Arrangements: Spouse/significant other Available Help at Discharge: Family Type of Home: House Home Access: Level entry Home Layout: One Point Lay: None Prior Function Level of Independence: Independent Communication Communication: No difficulties    Cognition  Cognition Arousal/Alertness: Awake/alert Behavior During Therapy: WFL for tasks assessed/performed Overall Cognitive Status: Within Functional Limits for tasks assessed    Extremity/Trunk Assessment Upper Extremity Assessment Upper Extremity Assessment: Overall WFL for tasks assessed Lower Extremity Assessment Lower Extremity Assessment: Generalized weakness   Balance Balance Overall balance assessment: Needs assistance Standing balance support: No upper extremity supported Standing balance-Leahy Scale: Fair  End of Session PT - End of Session Equipment Utilized During Treatment: Oxygen Activity Tolerance: Patient limited by fatigue Patient left: in bed;with call bell/phone within reach;with family/visitor present;with nursing/sitter in room Nurse Communication: Mobility status  GP     [facility] 11/28/2013, 3:28 PM  [facility]  PT [phone]

## 2013-11-28 NOTE — Progress Notes (Signed)
[facility] ROUNDING NOTE   Subjective:   Interval History: feels better   Objective:  Vital signs in last 24 hours:  Temp:  [97.6 F (36.4 C)-98.3 F (36.8 C)] 98 F (36.7 C) (02/24 1431) Pulse Rate:  [99-115] 115 (02/24 1431) Resp:  [20-26] 22 (02/24 1431) BP: (117-166)/(71-80) 138/78 mmHg (02/24 1431) SpO2:  [89 %-94 %] 94 % (02/24 1431) FiO2 (%):  [4 %] 4 % (02/23 1800) Weight:  [145.8 kg (321 lb 6.9 oz)] 145.8 kg (321 lb 6.9 oz) (02/24 0436)  Weight change: -4.5 kg (-9 lb 14.7 oz) Filed Weights   11/26/13 0400 11/27/13 0400 11/28/13 0436  Weight: 150 kg (330 lb 11 oz) 150.3 kg (331 lb 5.6 oz) 145.8 kg (321 lb 6.9 oz)    Intake/Output: I/O last 3 completed shifts: In: 2615 [P.O.:560; I.V.:1505; IV Piggyback:550] Out: 2977 [Urine:2976; Stool:1]   Intake/Output this shift:  Total I/O In: 240 [P.O.:240] Out: -   CVS- RRR RS- CTA ABD- BS present soft non-distended EXT- no edema   Basic Metabolic Panel:  Recent Labs Lab 11/25/13 0200 11/25/13 0340  11/26/13 1237 11/26/13 1756 11/27/13 0515 11/27/13 1840 11/28/13 0533  NA 129* 132*  < > 139 138 139 142 143  K 5.8* 6.0*  < > 3.8 3.5* 3.5* 4.3 4.7  CL 96 97  < > 99 97 101 103 103  CO2 11* 12*  < > 18* 20 22 19 24   GLUCOSE 373* 405*  < > 175* 205* 167* 200* 225*  BUN 76* 80*  < > 84* 82* 74* 65* 67*  CREATININE 5.07* 5.13*  < > 4.21* 3.88* 2.91* 2.02* 1.84*  CALCIUM 8.3* 8.6  < > 7.4* 7.1* 7.1* 7.7* 8.6  MG 2.1 2.2  --   --   --   --   --   --   PHOS 4.7* 5.1*  --   --   --   --   --   --   < > = values in this interval not displayed.  Liver Function Tests:  Recent Labs Lab 11/25/13 0200  AST 20  ALT 22  ALKPHOS 127*  BILITOT <0.2*  PROT 7.0  ALBUMIN 2.2*   No results found for this basename: LIPASE, AMYLASE,  in the last 168 hours No results found for this basename: AMMONIA,  in the last 168 hours  CBC:  Recent Labs Lab 11/25/13 0200 11/25/13 0340 11/26/13 0520  WBC  16.0* 13.5* 10.7*  NEUTROABS 14.8*  --   --   HGB 9.5* 10.0* 9.1*  HCT 29.4* 30.4* 26.7*  MCV 88.3 86.9 84.8  PLT 309 349 307    Cardiac Enzymes:  Recent Labs Lab 11/25/13 0200 11/25/13 0602 11/25/13 1224  TROPONINI <0.30 <0.30 <0.30    BNP: No components found with this basename: POCBNP,   CBG:  Recent Labs Lab 11/27/13 1129 11/27/13 1522 11/27/13 2133 11/28/13 0843 11/28/13 1133  GLUCAP 161* 171* 215* 219* 196*    Microbiology: Results for orders placed during the hospital encounter of 11/24/13  MRSA PCR SCREENING     Status: None   Collection Time    11/24/13 11:42 PM      Result Value Ref Range Status   MRSA by PCR NEGATIVE  NEGATIVE Final   Comment:            The GeneXpert MRSA Assay (FDA     approved for NASAL specimens     only), is one component of  a     comprehensive MRSA colonization     surveillance program. It is not     intended to diagnose MRSA     infection nor to guide or     monitor treatment for     MRSA infections.  RESPIRATORY VIRUS PANEL     Status: Abnormal   Collection Time    11/25/13 12:50 AM      Result Value Ref Range Status   Source - RVPAN NASAL SWAB   Corrected   Comment: CORRECTED ON 02/23 AT 1927: PREVIOUSLY REPORTED AS NASAL SWAB   Respiratory Syncytial Virus A NOT DETECTED   Final   Respiratory Syncytial Virus B DETECTED (*)  Final   Influenza A NOT DETECTED   Final   Influenza B NOT DETECTED   Final   Parainfluenza 1 NOT DETECTED   Final   Parainfluenza 2 NOT DETECTED   Final   Parainfluenza 3 NOT DETECTED   Final   Metapneumovirus NOT DETECTED   Final   Rhinovirus NOT DETECTED   Final   Adenovirus NOT DETECTED   Final   Influenza A H1 NOT DETECTED   Final   Influenza A H3 NOT DETECTED   Final   Comment: (NOTE)           Normal Reference Range for each Analyte: NOT DETECTED     Testing performed using the Luminex xTAG Respiratory Viral Panel test     kit.     This test was developed and its performance  characteristics determined     by [facility]. It has not been cleared or approved by the [place]     Food and Drug Administration. This test is used for clinical purposes.     It should not be regarded as investigational or for research. This     laboratory is certified under the [place] (CLIA) as qualified to perform high complexity     clinical laboratory testing.     Performed at [place], BLOOD (ROUTINE X 2)     Status: None   Collection Time    11/25/13  1:54 AM      Result Value Ref Range Status   Specimen Description BLOOD RIGHT ARM   Final   Special Requests BOTTLES DRAWN AEROBIC ONLY 2CC   Final   Culture  Setup Time     Final   Value: 11/25/2013 12:01     Performed at [facility]   Culture     Final   Value:        BLOOD CULTURE RECEIVED NO GROWTH TO DATE CULTURE WILL BE HELD FOR 5 DAYS BEFORE ISSUING A FINAL NEGATIVE REPORT     Performed at [facility]   Report Status PENDING   Incomplete  CULTURE, BLOOD (ROUTINE X 2)     Status: None   Collection Time    11/25/13  2:00 AM      Result Value Ref Range Status   Specimen Description BLOOD RIGHT HAND   Final   Special Requests BOTTLES DRAWN AEROBIC ONLY 10CC   Final   Culture  Setup Time     Final   Value: 11/25/2013 12:01     Performed at [facility]   Culture     Final   Value:        BLOOD CULTURE RECEIVED NO GROWTH TO DATE CULTURE WILL BE HELD FOR  5 DAYS BEFORE ISSUING A FINAL NEGATIVE REPORT     Performed at [facility]   Report Status PENDING   Incomplete  URINE CULTURE     Status: None   Collection Time    11/25/13  2:19 AM      Result Value Ref Range Status   Specimen Description URINE, CATHETERIZED   Final   Special Requests NONE   Final   Culture  Setup Time     Final   Value: 11/25/2013 12:13     Performed at [facility]     Final   Value: NO GROWTH     Performed at [facility]   Culture     Final   Value: NO GROWTH     Performed at [facility]   Report Status 11/26/2013 FINAL   Final    Coagulation Studies: No results found for this basename: LABPROT, INR,  in the last 72 hours  Urinalysis: No results found for this basename: COLORURINE, APPERANCEUR, LABSPEC, PHURINE, GLUCOSEU, HGBUR, BILIRUBINUR, KETONESUR, PROTEINUR, UROBILINOGEN, NITRITE, LEUKOCYTESUR,  in the last 72 hours    Imaging: Dg Chest Port 1 View  11/27/2013   CLINICAL DATA:  Septic emboli and shortness of breath.  EXAM: PORTABLE CHEST - 1 VIEW  COMPARISON:  DG CHEST 1V PORT dated 11/25/2013; CT CHEST Patrick/O CM dated 11/25/2013  FINDINGS: Central venous catheter tip in the lower SVC. There are increased parenchymal densities at both lung bases. Again noted is a vague nodular density in the right mid lung. Heart size is normal and stable. The trachea is midline. No evidence for a pneumothorax.  IMPRESSION: Increased parenchymal densities in the lower lungs bilaterally. Again noted is a nodular density in the right mid lung.   Electronically Signed   By: Markus Daft M.D.   On: 11/27/2013 07:35     Medications:     . albuterol  2.5 mg Nebulization TID  . antiseptic oral rinse  15 mL Mouth Rinse q12n4p  . aztreonam  1 g Intravenous 3 times per day  . budesonide-formoterol  2 puff Inhalation BID  . chlorhexidine  15 mL Mouth Rinse BID  . gabapentin  300 mg Oral BID  . heparin  5,000 Units Subcutaneous 3 times per day  . insulin aspart  0-20 Units Subcutaneous TID WC  . insulin aspart  0-5 Units Subcutaneous QHS  . insulin glargine  45 Units Subcutaneous Daily  . levofloxacin  500 mg Oral Q24H  . oxymetazoline  1 spray Each Nare BID  . [START ON 11/29/2013] predniSONE  30 mg Oral Q breakfast  . sertraline  200 mg Oral Daily   albuterol  Assessment/ Plan:  Pt is a 63yo M with PMH sig for COPD, DM, HTN, who presented to [facility] hospital with worsening SOB and was transferred to  [facility] due to hypoxic respiratory failure and metabolic acidosis in the setting of AKI. Unknown baseline Scr, however the patient had been taking NSAIDs (12 ibuprofen's daily for months) along with ACE-I and metformin PTA.    1. Acute renal failure with great diuresis and renal function improved creatinine now decreased to 1.8  2. Great diuresis urine output  3. Serological testing demonstrated a positive low titer ANCA. I shall order an elisa antibody test      LOS: 4 Kenneth Patrick @TODAY @3 :53 PM

## 2013-11-28 NOTE — Progress Notes (Signed)
ANTIBIOTIC CONSULT NOTE - FOLLOW UP  Pharmacy Consult for Azactam Indication: pneumonia  Allergies  Allergen Reactions  . Penicillins     Childhood allergy     Patient Measurements: Height: 6' 4"  (193 cm) Weight: 321 lb 6.9 oz (145.8 kg) IBW/kg (Calculated) : 86.8 Adjusted Body Weight:   Vital Signs: Temp: 97.6 F (36.4 C) (02/24 0436) Temp src: Oral (02/24 0436) BP: 145/77 mmHg (02/24 0618) Pulse Rate: 99 (02/24 0618) Intake/Output from previous day: 02/23 0701 - 02/24 0700 In: 620 [P.O.:320; I.V.:250; IV Piggyback:50] Out: 1202 [Urine:1201; Stool:1] Intake/Output from this shift:    Labs:  Recent Labs  11/26/13 0520  11/27/13 0515 11/27/13 1840 11/28/13 0533  WBC 10.7*  --   --   --   --   HGB 9.1*  --   --   --   --   PLT 307  --   --   --   --   CREATININE 4.53*  < > 2.91* 2.02* 1.84*  < > = values in this interval not displayed. Estimated Creatinine Clearance: 65 ml/min (by C-G formula based on Cr of 1.84). No results found for this basename: VANCOTROUGH, Corlis Leak, VANCORANDOM, [place], GENTPEAK, GENTRANDOM, TOBRATROUGH, TOBRAPEAK, TOBRARND, AMIKACINPEAK, AMIKACINTROU, AMIKACIN,  in the last 72 hours   Microbiology: Recent Results (from the past 720 hour(s))  MRSA PCR SCREENING     Status: None   Collection Time    11/24/13 11:42 PM      Result Value Ref Range Status   MRSA by PCR NEGATIVE  NEGATIVE Final   Comment:            The GeneXpert MRSA Assay (FDA     approved for NASAL specimens     only), is one component of a     comprehensive MRSA colonization     surveillance program. It is not     intended to diagnose MRSA     infection nor to guide or     monitor treatment for     MRSA infections.  RESPIRATORY VIRUS PANEL     Status: Abnormal   Collection Time    11/25/13 12:50 AM      Result Value Ref Range Status   Source - RVPAN NASAL SWAB   Corrected   Comment: CORRECTED ON 02/23 AT 1927: PREVIOUSLY REPORTED AS NASAL SWAB   Respiratory  Syncytial Virus A NOT DETECTED   Final   Respiratory Syncytial Virus B DETECTED (*)  Final   Influenza A NOT DETECTED   Final   Influenza B NOT DETECTED   Final   Parainfluenza 1 NOT DETECTED   Final   Parainfluenza 2 NOT DETECTED   Final   Parainfluenza 3 NOT DETECTED   Final   Metapneumovirus NOT DETECTED   Final   Rhinovirus NOT DETECTED   Final   Adenovirus NOT DETECTED   Final   Influenza A H1 NOT DETECTED   Final   Influenza A H3 NOT DETECTED   Final   Comment: (NOTE)           Normal Reference Range for each Analyte: NOT DETECTED     Testing performed using the Luminex xTAG Respiratory Viral Panel test     kit.     This test was developed and its performance characteristics determined     by [facility]. It has not been cleared or approved by the [place]     Food and Drug Administration. This test is used for  clinical purposes.     It should not be regarded as investigational or for research. This     laboratory is certified under the [place] (CLIA) as qualified to perform high complexity     clinical laboratory testing.     Performed at [facility], BLOOD (ROUTINE X 2)     Status: None   Collection Time    11/25/13  1:54 AM      Result Value Ref Range Status   Specimen Description BLOOD RIGHT ARM   Final   Special Requests BOTTLES DRAWN AEROBIC ONLY 2CC   Final   Culture  Setup Time     Final   Value: 11/25/2013 12:01     Performed at [facility]   Culture     Final   Value:        BLOOD CULTURE RECEIVED NO GROWTH TO DATE CULTURE WILL BE HELD FOR 5 DAYS BEFORE ISSUING A FINAL NEGATIVE REPORT     Performed at [facility]   Report Status PENDING   Incomplete  CULTURE, BLOOD (ROUTINE X 2)     Status: None   Collection Time    11/25/13  2:00 AM      Result Value Ref Range Status   Specimen Description BLOOD RIGHT HAND   Final   Special Requests BOTTLES DRAWN AEROBIC ONLY 10CC   Final    Culture  Setup Time     Final   Value: 11/25/2013 12:01     Performed at [facility]   Culture     Final   Value:        BLOOD CULTURE RECEIVED NO GROWTH TO DATE CULTURE WILL BE HELD FOR 5 DAYS BEFORE ISSUING A FINAL NEGATIVE REPORT     Performed at [facility]   Report Status PENDING   Incomplete  URINE CULTURE     Status: None   Collection Time    11/25/13  2:19 AM      Result Value Ref Range Status   Specimen Description URINE, CATHETERIZED   Final   Special Requests NONE   Final   Culture  Setup Time     Final   Value: 11/25/2013 12:13     Performed at [place]     Final   Value: NO GROWTH     Performed at [facility]   Culture     Final   Value: NO GROWTH     Performed at [facility]   Report Status 11/26/2013 FINAL   Final    Anti-infectives   Start     Dose/Rate Route Frequency Ordered Stop   11/26/13 1800  levofloxacin (LEVAQUIN) IVPB 750 mg    Comments:  Levaquin 750 mg IV q48h for CrCl < 50 mL/min   750 mg 100 mL/hr over 90 Minutes Intravenous Every 48 hours 11/25/13 0100     11/25/13 1800  vancomycin (VANCOCIN) 2,000 mg in sodium chloride 0.9 % 500 mL IVPB  Status:  Discontinued     2,000 mg 250 mL/hr over 120 Minutes Intravenous Every 24 hours 11/25/13 1647 11/27/13 1003   11/25/13 1400  aztreonam (AZACTAM) 1 g in dextrose 5 % 50 mL IVPB     1 g 100 mL/hr over 30 Minutes Intravenous 3 times per day 11/25/13 1314     11/25/13 1015  aztreonam (AZACTAM) 1 g in  dextrose 5 % 50 mL IVPB     1 g 100 mL/hr over 30 Minutes Intravenous  Once 11/25/13 1002 11/25/13 1100   11/25/13 0200  levofloxacin (LEVAQUIN) IVPB 750 mg  Status:  Discontinued     750 mg 100 mL/hr over 90 Minutes Intravenous Daily at bedtime 11/25/13 0028 11/25/13 0100      Assessment: CC: [facility]x from [facility] w/ resp failure  PMH: Diabetic neuropathy, back pain, asthma, OSA, depression, DM, HLD  AC: none PTA, heparin SQ for VTE  proph  ID: Levaquin D#5/aztreonam D#4 for PNA, now with ?septic emboli vs fungal infection vs vasculitis - Afebrile, WBC 10.7 on 2/22, Scr trending down to 1.84, HIV neg. Repiratory panel: +RSV type B. TTE negative for endocarditis.  LVQ 2/20>> Aztreonam 2/21>> Vanc 2/21>>2/23  2/21 UCx - NEG 2/21 BCx - NGTD 2/21: Respi: +RSV type B 2/20 MRSA neg - NEG   Plan: 1. Increase levaquin 731m IV Q24h 2. Continue aztreonam 1gm IV Q8H dose ok    Kieon Lawhorn S. RAlford Highland PharmD, BCPS Clinical Staff Pharmacist Pager [phone] REilene GhaziStillinger 11/28/2013,9:37 AM

## 2013-11-28 NOTE — Progress Notes (Signed)
RN called central telemetry to report that the strips could not be seen on our monitors. Staff at central tele confirmed that she was able to see the strips on her computer and did not understand why it was not showing up. Tele strip was unable to be documented. RN will continue to monitor patient.

## 2013-11-28 NOTE — Progress Notes (Signed)
Name: Kenneth Patrick MRN: [id number] DOB: 1951/05/27    ADMISSION DATE:  11/24/2013 CONSULTATION DATE:  11/24/2013  REFERRING MD :  [facility] PRIMARY SERVICE: PCCM  CHIEF COMPLAINT:  Shortness of breath  BRIEF PATIENT DESCRIPTION: 19 M with COPD who presented to OSH with hypoxic respiratory failure and metabolic acidosis in the setting of AKI. Has been feeling poorly over the past several weeks with increased productive cough and shortness of breath.  SIGNIFICANT EVENTS / STUDIES:  ABG at [facility] 7.14/32/54/10.9 on 2L --> 7.12/35/106/12.5 on BiPAP  BNP 684 at [facility] 5.03 ........................................................................................................................................ 2/21 - remains acidotic 2/21 - renal US>>>no hydronephrosis 2/21 - CT chest>>>bilateral lower lobe bronchiectasis, multiple tree in bud nodules in right lung compatible with infectious or inflammatory process, potential septic emboli 2/21 - acidosis improved on bicarb and with bipap, renal US normal 2/22 - no distress, no fevers, bipap off, no hd required 2/22 - echo>>>EF 61-44%, grade 1 diastolic dysfunction, no comment on thrombi  LINES / TUBES: PIV 2/20>> Right HD cath 2/21>>  CULTURES: UCx 2/21>>neg BCx 2/21>>ng Viral panel 2/21>>>RSV POSITIVE  ANTIBIOTICS: Levaquin 2/20 >> Vanc 2/21>>2/23 Aztreonam 2/2>>  SUBJECTIVE: got congested overnight, started back on afrin which is a home med, tolerated CPAP for 15 minutes. Nursing reports that patient has had loss of 20 lbs recently without trying and has had diminished appetite during this time. Needs 4 L [place]  VITAL SIGNS: Temp:  [97.6 F (36.4 C)-98.3 F (36.8 C)] 97.6 F (36.4 C) (02/24 0436) Pulse Rate:  [99-104] 99 (02/24 0618) Resp:  [20-28] 22 (02/24 0436) BP: (117-166)/(63-80) 145/77 mmHg (02/24 0618) SpO2:  [89 %-92 %] 92 % (02/24 0746) FiO2 (%):  [4 %] 4 % (02/23 1800) Weight:  [321 lb 6.9  oz (145.8 kg)] 321 lb 6.9 oz (145.8 kg) (02/24 0436)  VENTILATOR SETTINGS: Vent Mode:  [-]  FiO2 (%):  [4 %] 4 %  INTAKE / OUTPUT: Intake/Output     02/23 0701 - 02/24 0700 02/24 0701 - 02/25 0700   P.O. 320    I.V. (mL/kg) 250 (1.7)    IV Piggyback 50    Total Intake(mL/kg) 620 (4.3)    Urine (mL/kg/hr) 1201 (0.3)    Stool 1 (0)    Total Output 1202     Net -582          Urine Occurrence 2 x    Stool Occurrence 2 x     PHYSICAL EXAMINATION: General: Obese male in NAD Neuro: alert, appropriately answering questions, oriented x3 HEENT: [place] in place Cardiovascular: rrr, no murmur appreciated Lungs: Crackles bilaterally improved, diminished breath sounds Abdomen: Obese, NT +BS  Musculoskeletal: no edema in BLE  Skin: Intact  LABS:  CBC  Recent Labs Lab 11/25/13 0200 11/25/13 0340 11/26/13 0520  WBC 16.0* 13.5* 10.7*  HGB 9.5* 10.0* 9.1*  HCT 29.4* 30.4* 26.7*  PLT 309 349 307   Coag's  Recent Labs Lab 11/25/13 0200  INR 1.16   BMET  Recent Labs Lab 11/27/13 0515 11/27/13 1840 11/28/13 0533  NA 139 142 143  K 3.5* 4.3 4.7  CL 101 103 103  CO2 22 19 24   BUN 74* 65* 67*  CREATININE 2.91* 2.02* 1.84*  GLUCOSE 167* 200* 225*   Electrolytes  Recent Labs Lab 11/25/13 0200 11/25/13 0340  11/27/13 0515 11/27/13 1840 11/28/13 0533  CALCIUM 8.3* 8.6  < > 7.1* 7.7* 8.6  MG 2.1 2.2  --   --   --   --  PHOS 4.7* 5.1*  --   --   --   --   < > = values in this interval not displayed. Sepsis Markers  Recent Labs Lab 11/25/13 0200 11/25/13 0558 11/25/13 1224  LATICACIDVEN 0.9 1.0 1.1  PROCALCITON 2.50  --   --    ABG  Recent Labs Lab 11/25/13 0554 11/25/13 1712 11/27/13 0835  PHART 7.168* 7.305* 7.364  PCO2ART 34.9* 32.3* 37.8  PO2ART 61.0* 75.0* 63.0*   Liver Enzymes  Recent Labs Lab 11/25/13 0200  AST 20  ALT 22  ALKPHOS 127*  BILITOT <0.2*  ALBUMIN 2.2*   Cardiac Enzymes  Recent Labs Lab 11/25/13 0200 11/25/13 0602  11/25/13 1224  TROPONINI <0.30 <0.30 <0.30  PROBNP 199.5*  --   --    Glucose  Recent Labs Lab 11/27/13 0748 11/27/13 1129 11/27/13 1522 11/27/13 2133 11/28/13 0843 11/28/13 1133  GLUCAP 169* 161* 171* 215* 219* 196*    Imaging Dg Chest Port 1 View  11/27/2013   CLINICAL DATA:  Septic emboli and shortness of breath.  EXAM: PORTABLE CHEST - 1 VIEW  COMPARISON:  DG CHEST 1V PORT dated 11/25/2013; CT CHEST W/O CM dated 11/25/2013  FINDINGS: Central venous catheter tip in the lower SVC. There are increased parenchymal densities at both lung bases. Again noted is a vague nodular density in the right mid lung. Heart size is normal and stable. The trachea is midline. No evidence for a pneumothorax.  IMPRESSION: Increased parenchymal densities in the lower lungs bilaterally. Again noted is a nodular density in the right mid lung.   Electronically Signed   By: Kenneth Patrick M.D.   On: 11/27/2013 07:35    ASSESSMENT / PLAN:  PULMONARY  A/P:  Acute hypoxic respiratory failure -resolving RSV  OSA COPD PNA -RLL with bronchiectasis (new compared to CT 2013), Doubt Septic emboli TTE neg  Plan - Supplemental O2 to maintain sat >= 92% Antibiotics per above pcxr in am  Prednisone 40 mg QD -taper Albuterol nebs Continue symbicort Nocturnal CPAP  CARDIOVASCULAR  A/P:  Hypovolemia  Hypotension - ?related to septic emboli,  improved  Monitor BP   Doubt need for TEE  Encourage PO intake Remove HD cath  RENAL  A/P:  AKI: likely related to sepsis in combination with ACE/ibuproen/metformin use.  Renal US normal. c-ANCA POS 4:09 Metabolic acidosis anion gap: Likely 2/2 AKI. Improving Elevated ESR > 140 FeNa 0.5% would argue for prerenal Hypokalemia UA- granular casts, RBCs 21-50, no RBC casts Plan - Renal consult - appreciate their assistance BMETs daily Hold home metformin Replete K PO Allow pos balance F/u SPEP/UPEP  GASTROINTESTINAL  A/P:  No acute issues  carb mod/renal  diet   HEMATOLOGIC  A/P:  Anemia - stable  monitor CBC NO LOVENOX Sub q hep  INFECTIOUS  A/P:  PNA  ct azactam, levaquin No TEE at this time as this is likely PNA  ID consult - appreciate their help  ENDOCRINE  A/P:  DM  lantus 45 u SSI  cbgs improved -expect to rise again with pred     Kenneth Gens, NP-C [facility] Pgr: [phone] or [phone]  Independently examined pt, evaluated data & formulated above care plan with NP who scribed this note & edited by me. D/w ID & renal consultants  Eniyah Eastmond V.

## 2013-11-29 ENCOUNTER — Inpatient Hospital Stay (HOSPITAL_COMMUNITY): Payer: 59

## 2013-11-29 LAB — MAGNESIUM: Magnesium: 2.3 mg/dL (ref 1.5–2.5)

## 2013-11-29 LAB — CBC
HCT: 30.2 % — ABNORMAL LOW (ref 39.0–52.0)
HEMOGLOBIN: 9.9 g/dL — AB (ref 13.0–17.0)
MCH: 28.4 pg (ref 26.0–34.0)
MCHC: 32.8 g/dL (ref 30.0–36.0)
MCV: 86.5 fL (ref 78.0–100.0)
Platelets: 324 10*3/uL (ref 150–400)
RBC: 3.49 MIL/uL — ABNORMAL LOW (ref 4.22–5.81)
RDW: 16.1 % — ABNORMAL HIGH (ref 11.5–15.5)
WBC: 13.3 10*3/uL — ABNORMAL HIGH (ref 4.0–10.5)

## 2013-11-29 LAB — GLUCOSE, CAPILLARY
GLUCOSE-CAPILLARY: 274 mg/dL — AB (ref 70–99)
GLUCOSE-CAPILLARY: 270 mg/dL — AB (ref 70–99)
GLUCOSE-CAPILLARY: 274 mg/dL — AB (ref 70–99)
GLUCOSE-CAPILLARY: 246 mg/dL — AB (ref 70–99)
Glucose-Capillary: 216 mg/dL — ABNORMAL HIGH (ref 70–99)

## 2013-11-29 LAB — URINALYSIS, ROUTINE W REFLEX MICROSCOPIC
Bilirubin Urine: NEGATIVE
Glucose, UA: >1000 mg/dL — AB
Ketones, ur: NEGATIVE mg/dL
LEUKOCYTES UA: NEGATIVE
Nitrite: NEGATIVE
Protein, ur: NEGATIVE mg/dL
SPECIFIC GRAVITY, URINE: 1.027 (ref 1.005–1.030)
UROBILINOGEN UA: 0.2 mg/dL (ref 0.0–1.0)
pH: 6 (ref 5.0–8.0)

## 2013-11-29 LAB — PROTEIN ELECTROPHORESIS, SERUM
ALPHA-2-GLOBULIN: 19.1 % — AB (ref 7.1–11.8)
Albumin ELP: 42.5 % — ABNORMAL LOW (ref 55.8–66.1)
Alpha-1-Globulin: 11.2 % — ABNORMAL HIGH (ref 2.9–4.9)
BETA 2: 7.4 % — AB (ref 3.2–6.5)
Beta Globulin: 6.6 % (ref 4.7–7.2)
Gamma Globulin: 13.2 % (ref 11.1–18.8)
M-SPIKE, %: NOT DETECTED g/dL
TOTAL PROTEIN ELP: 6.2 g/dL (ref 6.0–8.3)

## 2013-11-29 LAB — BASIC METABOLIC PANEL
BUN: 53 mg/dL — ABNORMAL HIGH (ref 6–23)
CALCIUM: 9.1 mg/dL (ref 8.4–10.5)
CO2: 23 mEq/L (ref 19–32)
Chloride: 104 mEq/L (ref 96–112)
Creatinine, Ser: 1.48 mg/dL — ABNORMAL HIGH (ref 0.50–1.35)
GFR calc Af Amer: 57 mL/min — ABNORMAL LOW (ref >90)
GFR, EST NON AFRICAN AMERICAN: 49 mL/min — AB (ref >90)
GLUCOSE: 219 mg/dL — AB (ref 70–99)
POTASSIUM: 4 meq/L (ref 3.7–5.3)
SODIUM: 144 meq/L (ref 137–147)

## 2013-11-29 LAB — URINE MICROSCOPIC-ADD ON

## 2013-11-29 LAB — MPO/PR-3 (ANCA) ANTIBODIES

## 2013-11-29 LAB — PHOSPHORUS: Phosphorus: 3.5 mg/dL (ref 2.3–4.6)

## 2013-11-29 IMAGING — CR DG CHEST 2V
2 series · 2 of 2 positions shown · non-contrast
Comparison: [DATE]

CLINICAL DATA: Shortness of breath.  Follow-up pneumonia.

EXAM:
CHEST  2 VIEW

[w chest pa]
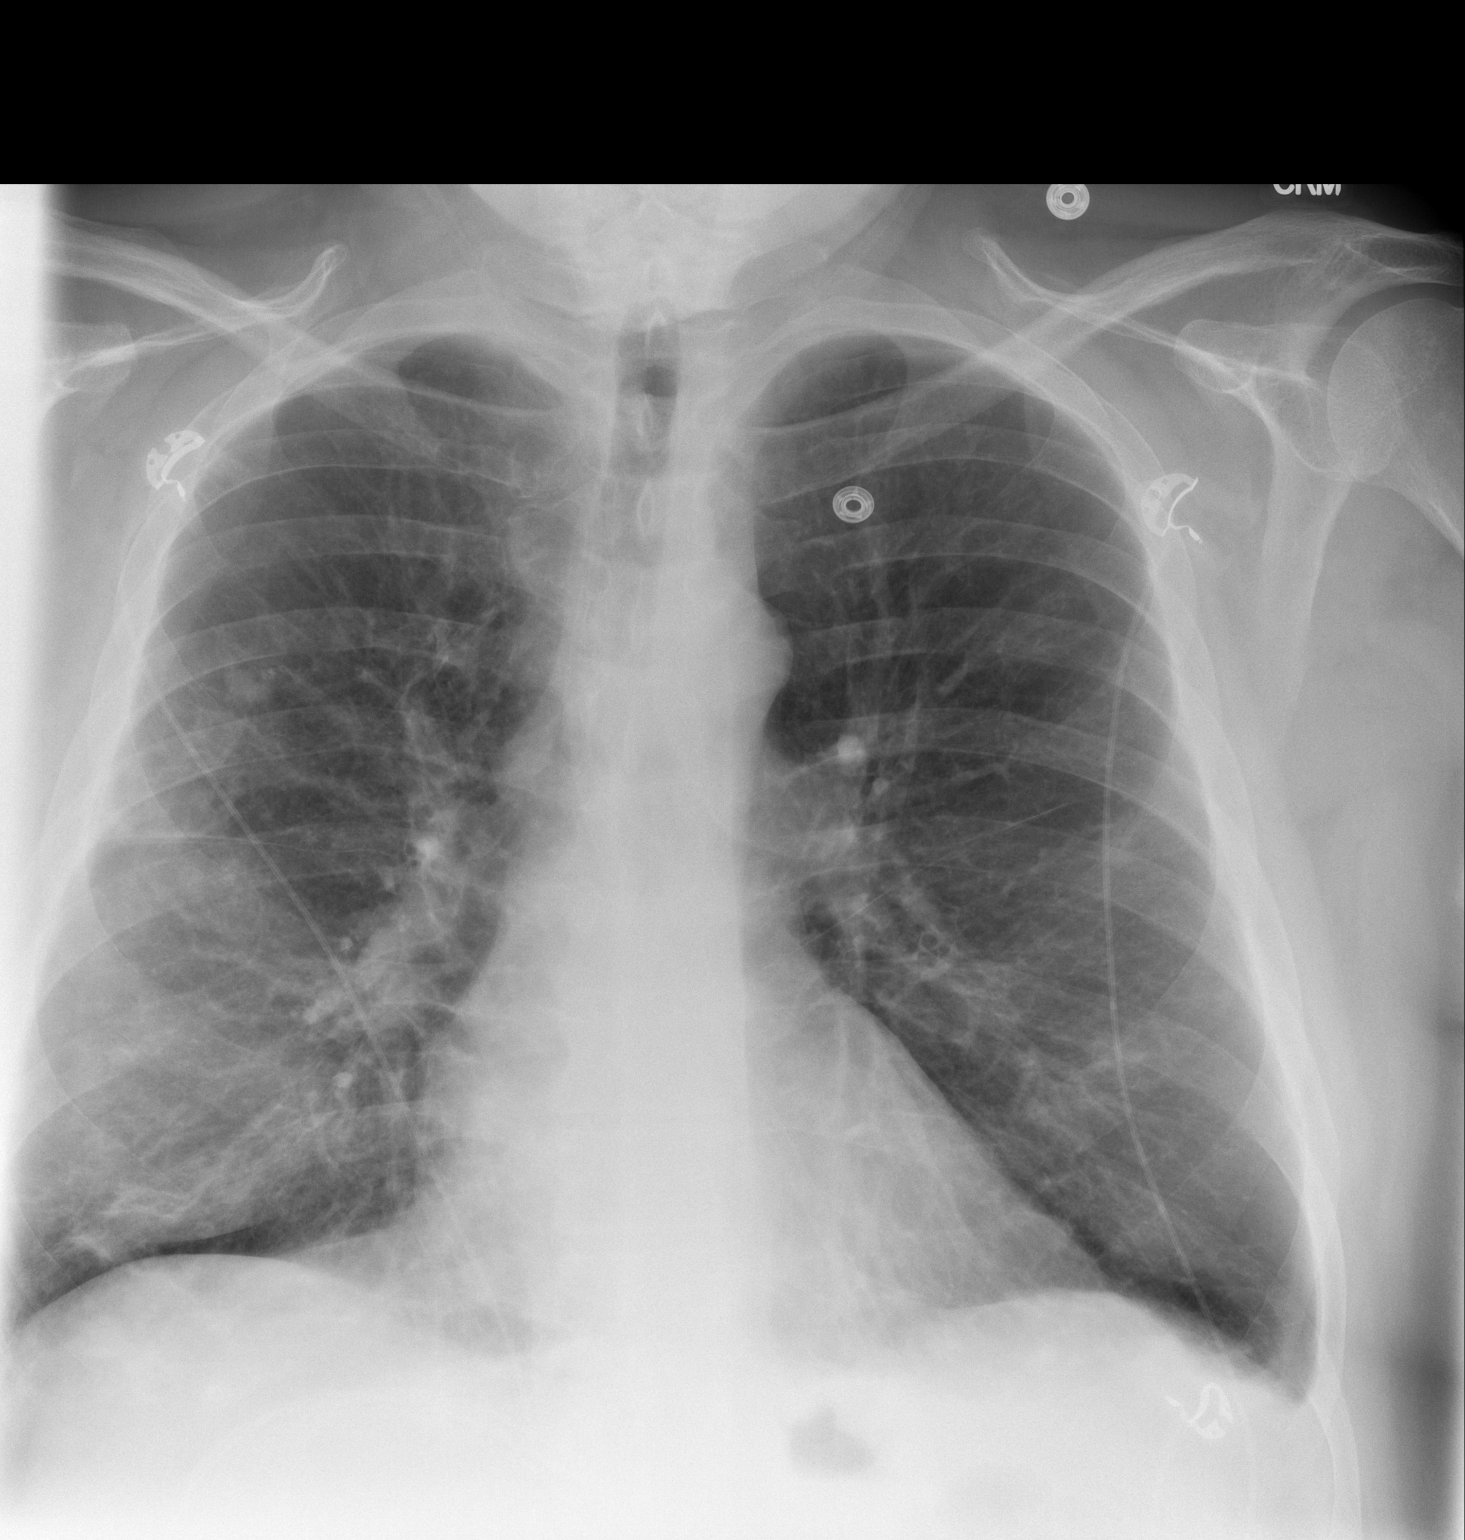

[w chest lat]
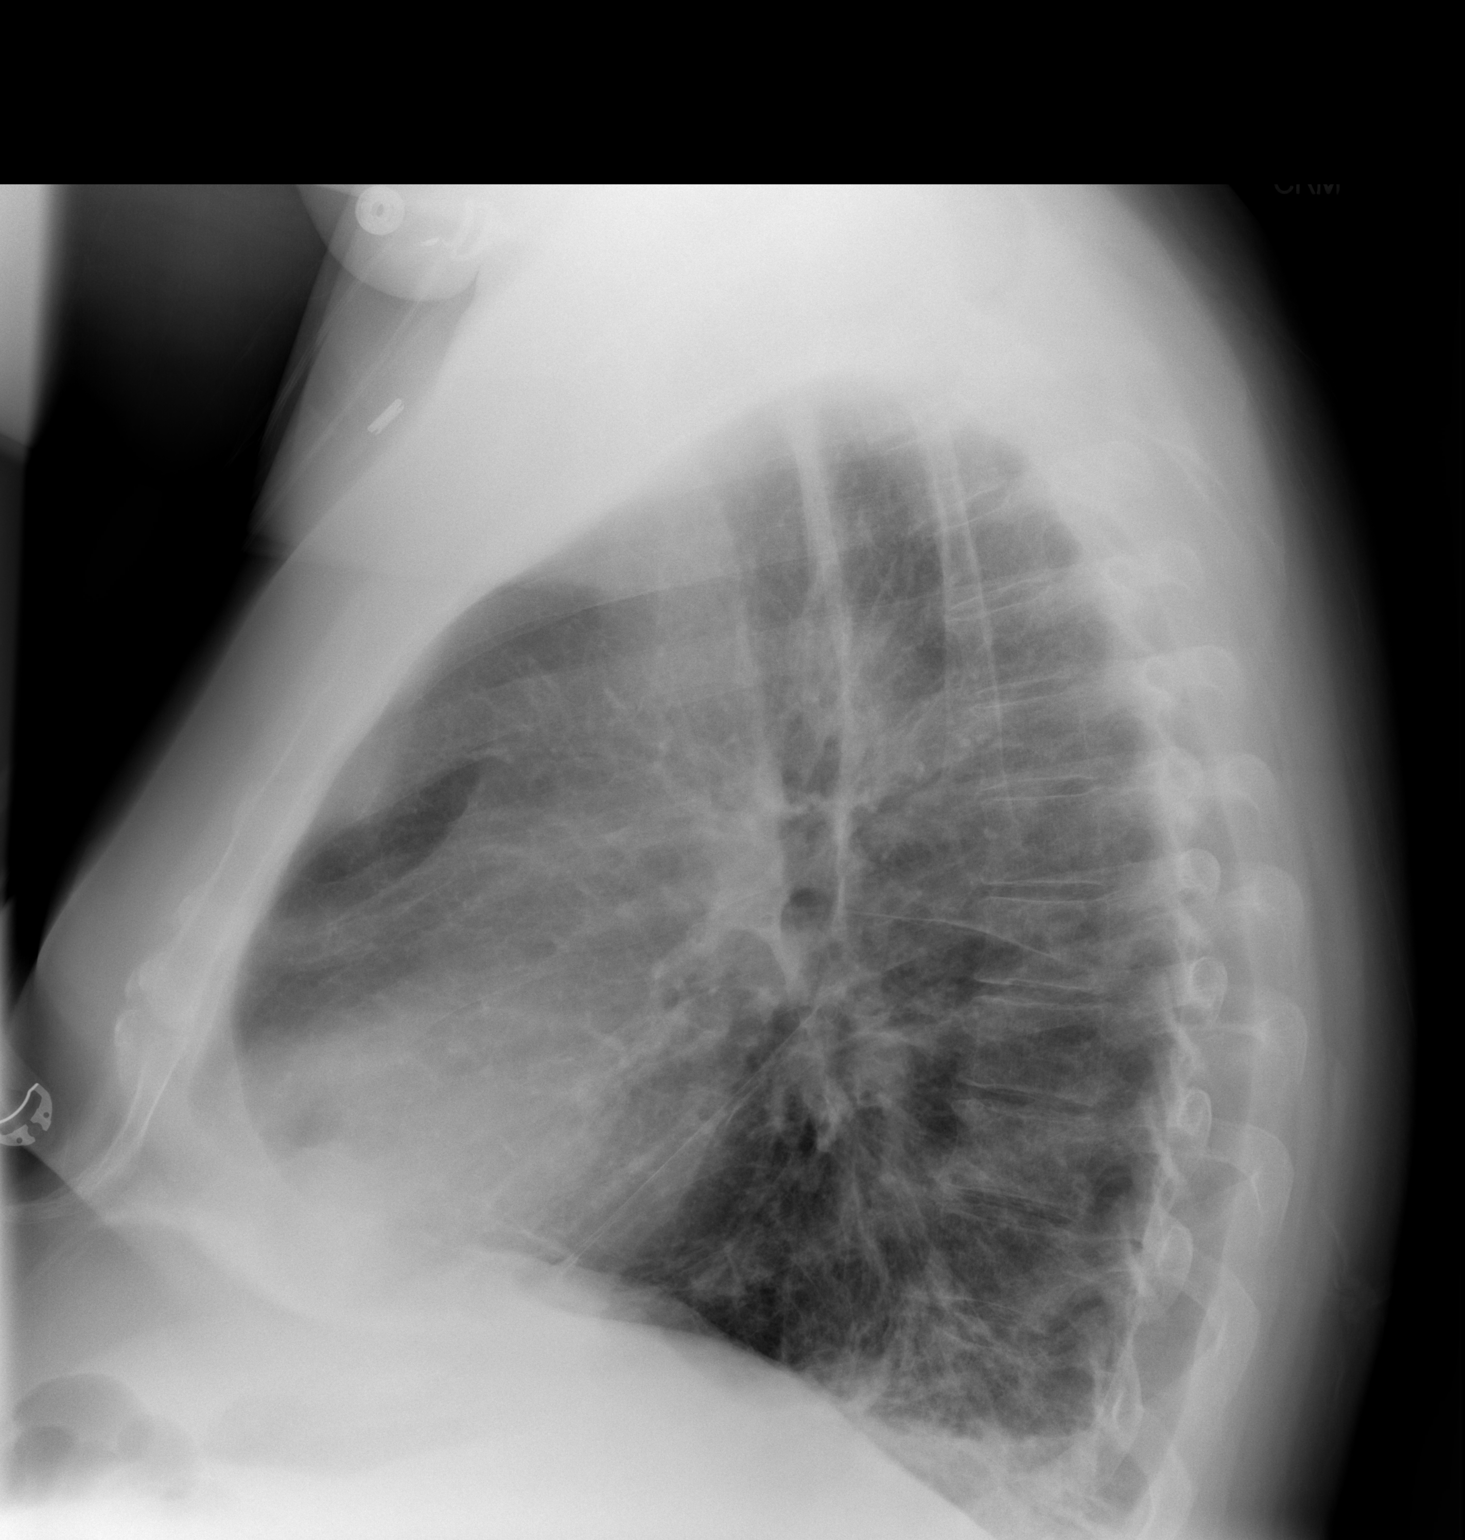

[2 of 2 positions shown; findings below may reference images not displayed]

FINDINGS: The right jugular central venous catheter has been removed. Again
noted are patchy densities throughout both lungs, right side greater
than left. Again noted is a nodular density in the right upper lung
that roughly measures 1.3 cm. Heart size is within normal limits.
The trachea is midline. Cannot exclude small pleural effusions.
Improved aeration at the lung bases compared to the prior
examination.
IMPRESSION: Persistent patchy parenchymal densities in both lungs, right side
greater the left. Findings remain compatible with an infectious or
inflammatory process. Recommend follow up to ensure resolution,
particularly of the nodular lesion in the right upper lung.

## 2013-11-29 NOTE — Progress Notes (Signed)
Pt stated he wears 15cm H2O at home and was titrated down to 10 on the previous night per pt comfort. At this time pt states he was unable to wear all night due to the pressure being too high. Pt stated he does not want to wear tonight. Offered pt a nasal mask in place on FFM for more comfort and he stated he did not think it would help. No distress noted at this time. Pt encouraged to call RT if pt changes mind.

## 2013-11-29 NOTE — Progress Notes (Signed)
Patient ID: Kenneth Patrick, male   DOB: 1950-12-15, 63 y.o.   MRN: [id number]         [name]    Date of Admission:  11/24/2013                   Day 4 levofloxacin        Day 4 aztreonam Principal Problem:   Respiratory failure Active Problems:   DM   C O P D   OSA (obstructive sleep apnea)   AKI (acute kidney injury)   Metabolic acidosis   Hypotension, unspecified   Acute respiratory failure with hypoxia   . albuterol  2.5 mg Nebulization TID  . antiseptic oral rinse  15 mL Mouth Rinse q12n4p  . aztreonam  1 g Intravenous 3 times per day  . budesonide-formoterol  2 puff Inhalation BID  . chlorhexidine  15 mL Mouth Rinse BID  . gabapentin  300 mg Oral BID  . heparin  5,000 Units Subcutaneous 3 times per day  . insulin aspart  0-20 Units Subcutaneous TID WC  . insulin aspart  0-5 Units Subcutaneous QHS  . insulin glargine  45 Units Subcutaneous Daily  . levofloxacin  500 mg Oral Q24H  . oxymetazoline  1 spray Each Nare BID  . predniSONE  30 mg Oral Q breakfast  . sertraline  200 mg Oral Daily    Subjective: He states he is feeling a little bit better.   Past Medical History  Diagnosis Date  . Diabetic neuropathy   . Respiratory infection   . Low back pain   . Asthma   . Dyspnea   . Sleep apnea   . Depression   . DM2 (diabetes mellitus, type 2)   . Hypercholesterolemia     History  Substance Use Topics  . Smoking status: Former Smoker -- 1.50 packs/day for 20 years    Quit date: 10/05/2004  . Smokeless tobacco: Never Used  . Alcohol Use: No    Family History  Problem Relation Age of Onset  . Leukemia Father   . Diabetes Mother     Allergies  Allergen Reactions  . Penicillins     Childhood allergy     Objective: Temp:  [97.4 F (36.3 C)-98 F (36.7 C)] 97.8 F (36.6 C) (02/25 0544) Pulse Rate:  [101-115] 101 (02/25 0544) Resp:  [22] 22 (02/25 0544) BP: (136-145)/(71-78) 136/71 mmHg (02/25 0544) SpO2:  [89  %-97 %] 97 % (02/25 0831) Weight:  [146.33 kg (322 lb 9.6 oz)] 146.33 kg (322 lb 9.6 oz) (02/25 0544)  General: He looks more comfortable but has audible wheezing Skin: No rash Lungs: Scattered crackles and wheezes. Cor: Regular S1-S2 without murmur Abdomen: Obese, soft and nontender  Lab Results Lab Results  Component Value Date   WBC 13.3* 11/29/2013   HGB 9.9* 11/29/2013   HCT 30.2* 11/29/2013   MCV 86.5 11/29/2013   PLT 324 11/29/2013    Lab Results  Component Value Date   CREATININE 1.48* 11/29/2013   BUN 53* 11/29/2013   NA 144 11/29/2013   K 4.0 11/29/2013   CL 104 11/29/2013   CO2 23 11/29/2013    Lab Results  Component Value Date   ALT 22 11/25/2013   AST 20 11/25/2013   ALKPHOS 127* 11/25/2013   BILITOT <0.2* 11/25/2013      Microbiology: Recent Results (from the past 240 hour(s))  MRSA PCR SCREENING     Status: None  Collection Time    11/24/13 11:42 PM      Result Value Ref Range Status   MRSA by PCR NEGATIVE  NEGATIVE Final   Comment:            The GeneXpert MRSA Assay (FDA     approved for NASAL specimens     only), is one component of a     comprehensive MRSA colonization     surveillance program. It is not     intended to diagnose MRSA     infection nor to guide or     monitor treatment for     MRSA infections.  RESPIRATORY VIRUS PANEL     Status: Abnormal   Collection Time    11/25/13 12:50 AM      Result Value Ref Range Status   Source - RVPAN NASAL SWAB   Corrected   Comment: CORRECTED ON 02/23 AT 1927: PREVIOUSLY REPORTED AS NASAL SWAB   Respiratory Syncytial Virus A NOT DETECTED   Final   Respiratory Syncytial Virus B DETECTED (*)  Final   Influenza A NOT DETECTED   Final   Influenza B NOT DETECTED   Final   Parainfluenza 1 NOT DETECTED   Final   Parainfluenza 2 NOT DETECTED   Final   Parainfluenza 3 NOT DETECTED   Final   Metapneumovirus NOT DETECTED   Final   Rhinovirus NOT DETECTED   Final   Adenovirus NOT DETECTED   Final   Influenza A  H1 NOT DETECTED   Final   Influenza A H3 NOT DETECTED   Final   Comment: (NOTE)           Normal Reference Range for each Analyte: NOT DETECTED     Testing performed using the Luminex xTAG Respiratory Viral Panel test     kit.     This test was developed and its performance characteristics determined     by [facility]. It has not been cleared or approved by the [place]     Food and Drug Administration. This test is used for clinical purposes.     It should not be regarded as investigational or for research. This     laboratory is certified under the [place] (CLIA) as qualified to perform high complexity     clinical laboratory testing.     Performed at [facility], BLOOD (ROUTINE X 2)     Status: None   Collection Time    11/25/13  1:54 AM      Result Value Ref Range Status   Specimen Description BLOOD RIGHT ARM   Final   Special Requests BOTTLES DRAWN AEROBIC ONLY 2CC   Final   Culture  Setup Time     Final   Value: 11/25/2013 12:01     Performed at [facility]   Culture     Final   Value:        BLOOD CULTURE RECEIVED NO GROWTH TO DATE CULTURE WILL BE HELD FOR 5 DAYS BEFORE ISSUING A FINAL NEGATIVE REPORT     Performed at [facility]   Report Status PENDING   Incomplete  CULTURE, BLOOD (ROUTINE X 2)     Status: None   Collection Time    11/25/13  2:00 AM      Result Value Ref Range Status   Specimen Description BLOOD RIGHT HAND   Final   Special Requests  BOTTLES DRAWN AEROBIC ONLY 10CC   Final   Culture  Setup Time     Final   Value: 11/25/2013 12:01     Performed at [facility]   Culture     Final   Value:        BLOOD CULTURE RECEIVED NO GROWTH TO DATE CULTURE WILL BE HELD FOR 5 DAYS BEFORE ISSUING A FINAL NEGATIVE REPORT     Performed at [facility]   Report Status PENDING   Incomplete  URINE CULTURE     Status: None   Collection Time    11/25/13  2:19 AM       Result Value Ref Range Status   Specimen Description URINE, CATHETERIZED   Final   Special Requests NONE   Final   Culture  Setup Time     Final   Value: 11/25/2013 12:13     Performed at [facility]     Final   Value: NO GROWTH     Performed at [facility]   Culture     Final   Value: NO GROWTH     Performed at [facility]   Report Status 11/26/2013 FINAL   Final    Studies/Results: Dg Chest 2 View  11/29/2013   CLINICAL DATA:  Shortness of breath.  Follow-up pneumonia.  EXAM: CHEST  2 VIEW  COMPARISON:  11/27/2013  FINDINGS: The right jugular central venous catheter has been removed. Again noted are patchy densities throughout both lungs, right side greater than left. Again noted is a nodular density in the right upper lung that roughly measures 1.3 cm. Heart size is within normal limits. The trachea is midline. Cannot exclude small pleural effusions. Improved aeration at the lung bases compared to the prior examination.  IMPRESSION: Persistent patchy parenchymal densities in both lungs, right side greater the left. Findings remain compatible with an infectious or inflammatory process. Recommend follow up to ensure resolution, particularly of the nodular lesion in the right upper lung.   Electronically Signed   By: Markus Daft M.D.   On: 11/29/2013 08:04    Assessment: It is difficult to know if he is responding to antibiotic therapy at this point. His chest x-ray still shows some patchy bilateral densities. We have very little data to help determine the cause of his bilateral nodular pneumonia. Aztreonam is probably adding relatively little to his therapy and I will consider stopping it soon and continuing levofloxacin for 2 weeks.  Plan: 1. Continue current antibiotics for now but continue stopping aztreonam soon  Michel Bickers, MD [facility] [phone] pager   [phone] cell 11/29/2013, 11:45  AM

## 2013-11-29 NOTE — Progress Notes (Signed)
Name: Kenneth Patrick MRN: [id number] DOB: [birth year]    ADMISSION DATE:  11/24/2013 CONSULTATION DATE:  11/24/2013  REFERRING MD :  OSH PRIMARY SERVICE: PCCM  CHIEF COMPLAINT:  Shortness of breath  BRIEF PATIENT DESCRIPTION: 56 M with COPD who presented to OSH with hypoxic respiratory failure and metabolic acidosis in the setting of AKI. Has been feeling poorly over the past several weeks with increased productive cough and shortness of breath.  SIGNIFICANT EVENTS / STUDIES:  ABG at [facility] 7.14/32/54/10.9 on 2L --> 7.12/35/106/12.5 on BiPAP  BNP 684 at [facility] 5.03 ........................................................................................................................................ 2/21 - remains acidotic 2/21 - renal US>>>no hydronephrosis 2/21 - CT chest>>>bilateral lower lobe bronchiectasis, multiple tree in bud nodules in right lung compatible with infectious or inflammatory process, potential septic emboli 2/21 - acidosis improved on bicarb and with bipap, renal US normal 2/22 - no distress, no fevers, bipap off, no hd required 2/22 - echo>>>EF 49-44%, grade 1 diastolic dysfunction, no comment on thrombi  LINES / TUBES: PIV 2/20>> Right HD cath 2/21>> 2/24?  CULTURES: UCx 2/21>>neg BCx 2/21>>ng Viral panel 2/21>>>RSV POSITIVE  ANTIBIOTICS: Levaquin 2/20 >> Vanc 2/21>>2/23 Aztreonam 2/2>>  SUBJECTIVE:  Wore CPAP overnight, pressure turned down slightly for comfort.  Now on 3L [place], O2 sats good.  No complaints, denies chest pain, SOB, N/V/D, abdominal pain, leg swelling.  Does endorse continued cough, non-productive.  VITAL SIGNS: Temp:  [97.4 F (36.3 C)-98 F (36.7 C)] 97.8 F (36.6 C) (02/25 0544) Pulse Rate:  [101-115] 101 (02/25 0544) Resp:  [22] 22 (02/25 0544) BP: (136-145)/(71-78) 136/71 mmHg (02/25 0544) SpO2:  [89 %-97 %] 97 % (02/25 0831) Weight:  [322 lb 9.6 oz (146.33 kg)] 322 lb 9.6 oz (146.33 kg) (02/25  0544)  VENTILATOR SETTINGS:    INTAKE / OUTPUT: Intake/Output     02/24 0701 - 02/25 0700 02/25 0701 - 02/26 0700   P.O. 480    I.V. (mL/kg)     IV Piggyback 100    Total Intake(mL/kg) 580 (4)    Urine (mL/kg/hr)     Stool     Total Output       Net +580           PHYSICAL EXAMINATION: General: Obese male in NAD Neuro: A&O X 3, mood and affect appropriate. HEENT: [place] in place Cardiovascular: rrr, no murmur appreciated with respect to body habitus. Lungs: Intermittent crackles throughout, diminished breath sounds bilaterally. Abdomen: Obese, NT +BS  Musculoskeletal: Mild edema BLE, non pitting Skin: Intact  LABS:  CBC  Recent Labs Lab 11/25/13 0340 11/26/13 0520 11/29/13 0500  WBC 13.5* 10.7* 13.3*  HGB 10.0* 9.1* 9.9*  HCT 30.4* 26.7* 30.2*  PLT 349 307 324   Coag's  Recent Labs Lab 11/25/13 0200  INR 1.16   BMET  Recent Labs Lab 11/27/13 1840 11/28/13 0533 11/29/13 0500  NA 142 143 144  K 4.3 4.7 4.0  CL 103 103 104  CO2 19 24 23   BUN 65* 67* 53*  CREATININE 2.02* 1.84* 1.48*  GLUCOSE 200* 225* 219*   Electrolytes  Recent Labs Lab 11/25/13 0200 11/25/13 0340  11/27/13 1840 11/28/13 0533 11/29/13 0500  CALCIUM 8.3* 8.6  < > 7.7* 8.6 9.1  MG 2.1 2.2  --   --   --  2.3  PHOS 4.7* 5.1*  --   --   --  3.5  < > = values in this interval not displayed. Sepsis Markers  Recent Labs Lab 11/25/13 0200  11/25/13 0558 11/25/13 1224  LATICACIDVEN 0.9 1.0 1.1  PROCALCITON 2.50  --   --    ABG  Recent Labs Lab 11/25/13 0554 11/25/13 1712 11/27/13 0835  PHART 7.168* 7.305* 7.364  PCO2ART 34.9* 32.3* 37.8  PO2ART 61.0* 75.0* 63.0*   Liver Enzymes  Recent Labs Lab 11/25/13 0200  AST 20  ALT 22  ALKPHOS 127*  BILITOT <0.2*  ALBUMIN 2.2*   Cardiac Enzymes  Recent Labs Lab 11/25/13 0200 11/25/13 0602 11/25/13 1224  TROPONINI <0.30 <0.30 <0.30  PROBNP 199.5*  --   --    Glucose  Recent Labs Lab 11/27/13 2133  11/28/13 0843 11/28/13 1133 11/28/13 1722 11/28/13 2113 11/29/13 0803  GLUCAP 215* 219* 196* 307* 232* 216*    Imaging Dg Chest 2 View  11/29/2013   CLINICAL DATA:  Shortness of breath.  Follow-up pneumonia.  EXAM: CHEST  2 VIEW  COMPARISON:  11/27/2013  FINDINGS: The right jugular central venous catheter has been removed. Again noted are patchy densities throughout both lungs, right side greater than left. Again noted is a nodular density in the right upper lung that roughly measures 1.3 cm. Heart size is within normal limits. The trachea is midline. Cannot exclude small pleural effusions. Improved aeration at the lung bases compared to the prior examination.  IMPRESSION: Persistent patchy parenchymal densities in both lungs, right side greater the left. Findings remain compatible with an infectious or inflammatory process. Recommend follow up to ensure resolution, particularly of the nodular lesion in the right upper lung.   Electronically Signed   By: Markus Daft M.D.   On: 11/29/2013 08:04    ASSESSMENT / PLAN:  PULMONARY  A/P:  Acute hypoxic respiratory failure - gradually improving RSV  OSA COPD PNA - RLL with bronchiectasis (new compared to CT 2013), Doubt Septic emboli TTE neg  Bilateral pulm infiltrates - unknown etiology at this point. Plan: Supplemental O2 to maintain sat >= 92%, may need home o2 Antibiotics per above Dc Prednisone Albuterol nebs Continue symbicort Nocturnal CPAP Afrin added, continue.  CARDIOVASCULAR  A/P:  Hypovolemia - appears resolved Hypotension - ? related to septic emboli,  Resolved. Plan: Monitor  RENAL  A/P:  AKI: likely related to sepsis in combination with ACE/ibuprofen/metformin use.  FeNa 0.5% would argue for prerenalRenal [place] normal. c-ANCA POS 1:20 ? Clinical significance Metabolic acidosis anion gap: Likely 2/2 AKI. Improving Elevated ESR > 140 Hypokalemia - resoled UA - granular casts, RBCs 21-50, no RBC casts. UPEP -  polyclonal light chains , doubt of any significance in this diabetic Plan - Renal consult - appreciate their assistance. F/u elisa antibody test per renal BMETs daily Hold home metformin Allow pos balance  GASTROINTESTINAL  A/P:  No acute issues Plan: carb mod/renal diet  HEMATOLOGIC  A/P:  Anemia - stable Plan: monitor CBC SQ Heparin -- No Lovenox given renal fx  INFECTIOUS  A/P:  PNA - RSV pos but doubt this is cause. Bilateral pulm infiltrates - unknown etiology, question infectious vs viral vs inflammatory Plan: Continue azactam, levaquin x 2 wks total. ID consult - appreciate their help. Consider PCT algorithm to guide abx therapy?  ENDOCRINE  A/P:  DM Plan: lantus 45 u SSI  cbgs  expect to improve off pred    Montey Hora, PA - [facility] Pgr: [phone]  or [phone]  Independently examined pt, evaluated data & formulated above care plan with NP who scribed this note &  edited by me. Hope to dc in am, reassess but will likely need home O2, Treat with levaquin x 2 wks , if infiltrates/ hypoxia persist beyond 4-6 wks , may consider bronchoscopy   ALVA,RAKESH V.

## 2013-11-29 NOTE — Progress Notes (Signed)
Physical Therapy Treatment Patient Details Name: Kenneth Patrick MRN: [id number] DOB: 10-Aug-1951 Today's Date: 11/29/2013 Time: 6384-6659 PT Time Calculation (min): 19 min  PT Assessment / Plan / Recommendation  History of Present Illness 32 M with COPD who presented to OSH with hypoxic respiratory failure and metabolic acidosis in the setting of AKI. Pt with PNA and RSV.   PT Comments   Pt still staggering during ambulation holding IV pole today.  He walked without O2 and despite DOE 3-4/4 with gait and HR 121 bpm (up from 108 at rest) pt maintained sats of 91% on RA.  He has very limited respiratory endurance at this point and is mildly unsteady on his feet.  I talked to him about using a cane and getting some home therapy, but he politely declined telling me that once he gets back home he will return to his normal level of mobility. He reports he just has not been up moving here in the hospital that much.    Follow Up Recommendations  Home health PT (pt would benefit, but talke to him about it and he declined)     Does the patient have the potential to tolerate intense rehabilitation    NA  Barriers to Discharge   None      Equipment Recommendations   (he would likely benefit from at least a cane, but pt decline)    Recommendations for Other Services  NA  Frequency Min 3X/week   Progress towards PT Goals Progress towards PT goals: Progressing toward goals  Plan Discharge plan needs to be updated    Precautions / Restrictions Precautions Precautions: Fall;Other (comment) Precaution Comments: contact/droplet, mildly staggering gait pattern.  Monitor O2 sats and HR during mobility   Pertinent Vitals/Pain HR 108 at rest 121 with gait.  O2 sats 94 on RA at rest, down to 90% on RA with gait with DOE 3-4/4 during gait.     Mobility  Transfers Overall transfer level: Needs assistance Equipment used: None Transfers: Sit to/from Stand Sit to Stand: Supervision General transfer comment:  supervision for safety Ambulation/Gait Ambulation/Gait assistance: Supervision Ambulation Distance (Feet): 120 Feet Assistive device:  (IV pole) Gait Pattern/deviations: Step-through pattern;Shuffle;Staggering right;Staggering left Gait velocity: decreased Gait velocity interpretation: Below normal speed for age/gender General Gait Details: mildly staggering gait pattern despite holding the IV pole.  HR started at 108 and increased to 121 during gait.  DOE increased to 3-4/4 with gait, however, O2 sats 91% throughout on RA.        PT Goals (current goals can now be found in the care plan section) Acute Rehab PT Goals Patient Stated Goal: return home tomorrow  Visit Information  Last PT Received On: 11/29/13 Assistance Needed: +1 History of Present Illness: 19 M with COPD who presented to OSH with hypoxic respiratory failure and metabolic acidosis in the setting of AKI. Pt with PNA and RSV.    Subjective Data  Subjective: Pt reports that he did not use O2 at home PTA.  Patient Stated Goal: return home tomorrow   Cognition  Cognition Arousal/Alertness: Awake/alert Behavior During Therapy: WFL for tasks assessed/performed Overall Cognitive Status: Within Functional Limits for tasks assessed    Balance  Balance Overall balance assessment: Needs assistance Sitting-balance support: Feet supported Sitting balance-Leahy Scale: Good Standing balance support: Single extremity supported Standing balance-Leahy Scale: Fair General Comments General comments (skin integrity, edema, etc.): Educated pt re: energy conservation techniques, needing to plan to take more rest breaks at home.  End of Session PT - End of Session Activity Tolerance: Patient limited by fatigue;Treatment limited secondary to medical complications (Comment) (limited by DOE) Patient left: in bed;with call bell/phone within reach;Other (comment) (seated EOB. )     Wells Guiles B. [place], [place], DPT  [phone]   11/29/2013, 5:02 PM

## 2013-11-29 NOTE — Progress Notes (Signed)
[facility] ROUNDING NOTE   Subjective:   Interval History: improving slowly  Vital signs in last 24 hours:  Temp:  [97.4 F (36.3 C)-98 F (36.7 C)] 97.8 F (36.6 C) (02/25 0544) Pulse Rate:  [101-115] 101 (02/25 0544) Resp:  [22] 22 (02/25 0544) BP: (136-145)/(71-78) 136/71 mmHg (02/25 0544) SpO2:  [89 %-97 %] 97 % (02/25 0831) Weight:  [146.33 kg (322 lb 9.6 oz)] 146.33 kg (322 lb 9.6 oz) (02/25 0544)  Weight change: 0.53 kg (1 lb 2.7 oz) Filed Weights   11/27/13 0400 11/28/13 0436 11/29/13 0544  Weight: 150.3 kg (331 lb 5.6 oz) 145.8 kg (321 lb 6.9 oz) 146.33 kg (322 lb 9.6 oz)    Intake/Output: I/O last 3 completed shifts: In: 92 [P.O.:480; IV Piggyback:100] Out: 2 [Urine:1; Stool:1]   Intake/Output this shift:     CVS- RRR RS- CTA ABD- BS present soft non-distended EXT- no edema   Basic Metabolic Panel:  Recent Labs Lab 11/25/13 0200 11/25/13 0340  11/26/13 1756 11/27/13 0515 11/27/13 1840 11/28/13 0533 11/29/13 0500  NA 129* 132*  < > 138 139 142 143 144  K 5.8* 6.0*  < > 3.5* 3.5* 4.3 4.7 4.0  CL 96 97  < > 97 101 103 103 104  CO2 11* 12*  < > _0 GLUCOSE 373* 405*  < > 205* 167* 200* 225* 219*  BUN 76* 80*  < > 82* 74* 65* 67* 53*  CREATININE 5.07* 5.13*  < > 3.88* 2.91* 2.02* 1.84* 1.48*  CALCIUM 8.3* 8.6  < > 7.1* 7.1* 7.7* 8.6 9.1  MG 2.1 2.2  --   --   --   --   --  2.3  PHOS 4.7* 5.1*  --   --   --   --   --  3.5  < > = values in this interval not displayed.  Liver Function Tests:  Recent Labs Lab 11/25/13 0200  AST 20  ALT 22  ALKPHOS 127*  BILITOT <0.2*  PROT 7.0  ALBUMIN 2.2*   No results found for this basename: LIPASE, AMYLASE,  in the last 168 hours No results found for this basename: AMMONIA,  in the last 168 hours  CBC:  Recent Labs Lab 11/25/13 0200 11/25/13 0340 11/26/13 0520 11/29/13 0500  WBC 16.0* 13.5* 10.7* 13.3*  NEUTROABS 14.8*  --   --   --   HGB 9.5* 10.0* 9.1* 9.9*  HCT  29.4* 30.4* 26.7* 30.2*  MCV 88.3 86.9 84.8 86.5  PLT 309 349 307 324    Cardiac Enzymes:  Recent Labs Lab 11/25/13 0200 11/25/13 0602 11/25/13 1224  TROPONINI <0.30 <0.30 <0.30    BNP: No components found with this basename: POCBNP,   CBG:  Recent Labs Lab 11/28/13 1133 11/28/13 1722 11/28/13 2113 11/29/13 0803 11/29/13 1128  GLUCAP 196* 307* 232* 216* 274*    Microbiology: Results for orders placed during the hospital encounter of 11/24/13  MRSA PCR SCREENING     Status: None   Collection Time    11/24/13 11:42 PM      Result Value Ref Range Status   MRSA by PCR NEGATIVE  NEGATIVE Final   Comment:            The GeneXpert MRSA Assay (FDA     approved for NASAL specimens     only), is one component of a     comprehensive MRSA colonization     surveillance  program. It is not     intended to diagnose MRSA     infection nor to guide or     monitor treatment for     MRSA infections.  RESPIRATORY VIRUS PANEL     Status: Abnormal   Collection Time    11/25/13 12:50 AM      Result Value Ref Range Status   Source - RVPAN NASAL SWAB   Corrected   Comment: CORRECTED ON 02/23 AT 1927: PREVIOUSLY REPORTED AS NASAL SWAB   Respiratory Syncytial Virus A NOT DETECTED   Final   Respiratory Syncytial Virus B DETECTED (*)  Final   Influenza A NOT DETECTED   Final   Influenza B NOT DETECTED   Final   Parainfluenza 1 NOT DETECTED   Final   Parainfluenza 2 NOT DETECTED   Final   Parainfluenza 3 NOT DETECTED   Final   Metapneumovirus NOT DETECTED   Final   Rhinovirus NOT DETECTED   Final   Adenovirus NOT DETECTED   Final   Influenza A H1 NOT DETECTED   Final   Influenza A H3 NOT DETECTED   Final   Comment: (NOTE)           Normal Reference Range for each Analyte: NOT DETECTED     Testing performed using the Luminex xTAG Respiratory Viral Panel test     kit.     This test was developed and its performance characteristics determined     by [facility]. It has  not been cleared or approved by the [place]     Food and Drug Administration. This test is used for clinical purposes.     It should not be regarded as investigational or for research. This     laboratory is certified under the [place] (CLIA) as qualified to perform high complexity     clinical laboratory testing.     Performed at [place], BLOOD (ROUTINE X 2)     Status: None   Collection Time    11/25/13  1:54 AM      Result Value Ref Range Status   Specimen Description BLOOD RIGHT ARM   Final   Special Requests BOTTLES DRAWN AEROBIC ONLY 2CC   Final   Culture  Setup Time     Final   Value: 11/25/2013 12:01     Performed at [facility]   Culture     Final   Value:        BLOOD CULTURE RECEIVED NO GROWTH TO DATE CULTURE WILL BE HELD FOR 5 DAYS BEFORE ISSUING A FINAL NEGATIVE REPORT     Performed at [facility]   Report Status PENDING   Incomplete  CULTURE, BLOOD (ROUTINE X 2)     Status: None   Collection Time    11/25/13  2:00 AM      Result Value Ref Range Status   Specimen Description BLOOD RIGHT HAND   Final   Special Requests BOTTLES DRAWN AEROBIC ONLY 10CC   Final   Culture  Setup Time     Final   Value: 11/25/2013 12:01     Performed at [facility]   Culture     Final   Value:        BLOOD CULTURE RECEIVED NO GROWTH TO DATE CULTURE WILL BE HELD FOR 5 DAYS BEFORE ISSUING A FINAL NEGATIVE REPORT     Performed  at [facility]   Report Status PENDING   Incomplete  URINE CULTURE     Status: None   Collection Time    11/25/13  2:19 AM      Result Value Ref Range Status   Specimen Description URINE, CATHETERIZED   Final   Special Requests NONE   Final   Culture  Setup Time     Final   Value: 11/25/2013 12:13     Performed at [place]     Final   Value: NO GROWTH     Performed at [facility]   Culture     Final   Value: NO GROWTH      Performed at [facility]   Report Status 11/26/2013 FINAL   Final    Coagulation Studies: No results found for this basename: LABPROT, INR,  in the last 72 hours  Urinalysis: No results found for this basename: COLORURINE, APPERANCEUR, LABSPEC, PHURINE, GLUCOSEU, HGBUR, BILIRUBINUR, KETONESUR, PROTEINUR, UROBILINOGEN, NITRITE, LEUKOCYTESUR,  in the last 72 hours    Imaging: Dg Chest 2 View  11/29/2013   CLINICAL DATA:  Shortness of breath.  Follow-up pneumonia.  EXAM: CHEST  2 VIEW  COMPARISON:  11/27/2013  FINDINGS: The right jugular central venous catheter has been removed. Again noted are patchy densities throughout both lungs, right side greater than left. Again noted is a nodular density in the right upper lung that roughly measures 1.3 cm. Heart size is within normal limits. The trachea is midline. Cannot exclude small pleural effusions. Improved aeration at the lung bases compared to the prior examination.  IMPRESSION: Persistent patchy parenchymal densities in both lungs, right side greater the left. Findings remain compatible with an infectious or inflammatory process. Recommend follow up to ensure resolution, particularly of the nodular lesion in the right upper lung.   Electronically Signed   By: Markus Daft M.D.   On: 11/29/2013 08:04     Medications:     . albuterol  2.5 mg Nebulization TID  . antiseptic oral rinse  15 mL Mouth Rinse q12n4p  . aztreonam  1 g Intravenous 3 times per day  . budesonide-formoterol  2 puff Inhalation BID  . chlorhexidine  15 mL Mouth Rinse BID  . gabapentin  300 mg Oral BID  . heparin  5,000 Units Subcutaneous 3 times per day  . insulin aspart  0-20 Units Subcutaneous TID WC  . insulin aspart  0-5 Units Subcutaneous QHS  . insulin glargine  45 Units Subcutaneous Daily  . levofloxacin  500 mg Oral Q24H  . oxymetazoline  1 spray Each Nare BID  . predniSONE  30 mg Oral Q breakfast  . sertraline  200 mg Oral Daily    albuterol  Assessment/ Plan:  Pt is a 63yo M with PMH sig for COPD, DM, HTN, who presented to [facility] hospital with worsening SOB and was transferred to [facility] due to hypoxic respiratory failure and metabolic acidosis in the setting of AKI. Unknown baseline Scr, however the patient had been taking NSAIDs (12 ibuprofen's daily for months) along with ACE-I and metformin PTA.  1. Acute renal failure with great diuresis and renal function improved creatinine now decreased to 1.5  From 1.8 2. Great diuresis urine output  3. 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

## 2013-11-29 NOTE — Discharge Summary (Signed)
Physician Discharge Summary  Patient ID: Kenneth Patrick MRN: [id number] DOB/AGE: 1951-09-08 63 y.o.  Admit date: 11/24/2013 Discharge date: 11/30/2013    Discharge Diagnoses:  Acute Hypoxic Respiratory Failure PNA RSV Bronchitis OSA COPD Bilateral Pulmonary Infiltrates AKI Anion Gap Metabolic Acidosis DM                                                                   DISCHARGE PLAN BY DIAGNOSIS     Acute Hypoxic Respiratory Failure RSV Bronchitis OSA COPD Concern for PNA Bilateral Pulmonary Infiltrates Discharge Plan: - Continue Levaquin for 2 weeks, stop date of 12/08/13 - May need home O2 at some point eventually. - F/u with [facility] as below for repeat CXR.  If no improvement/resolution, will consider further evaluation.  AKI Discharge Plan: - No Metformin, Lisinopril, or NSAID's. - F/u with Nephrology in 2 - 4 weeks for ELISA results.  DM Discharge Plan: - F/u with PCP for DM management. - STOP Metformin but may resume other home meds.               DISCHARGE SUMMARY   Kenneth Patrick is a 63 y.o. y/o male with a PMH of DM2, Diabetic Neuropathy, Asthma, Dyspnea, OSA, Depression, Low Back Pain, and Hypercholesterolemia who presented to [name] on 11/24[facility]13 after visiting his PCP and developing near-syncope. He had been sick with a 3 week history of increased SOB, cough productive of yellowish/greenish sputum, and chills. Workup at [facility] consistent with hypoxic respiratory failure and metabolic acidosis in the setting of AKI.   Initial CXR at our facility was suggestive of PNA.  Chest CT done and showed bilateral lower lobe bronchiectasis (infectious vs inflammatory, doubt septic emboli).   Echo performed on 2/22 showed grade 1 diastolic dysfunction with EF 65-70%, no evidence/comment on thrombi. Blood and urine cultures negative.  Viral panel positive for RSV. Was empirically treated for infectious process with Levaquin/Vanc/Aztreonam. On day  prior to discharge, pt ambulated with PT.  Maintained SpO2 91% on RA although did demonstrate limited respiratory endurance per PT notes.  PT suggested that pt use a cane and get home PT; however, pt politely declined as he feels that he is deconditioned from being in hospital and not moving around as much.  He feels that once he returns home, he will return to his normal level of mobility.    SIGNIFICANT DIAGNOSTIC STUDIES 2/20 - Initial ABG at [facility] >>> 7.14/32/54/10.9 on 2L --> 7.12/35/106/12.5 on BiPAP  2/20 - Initial BNP at [facility] >>> 684 2/21 - Renal US >>> no hydronephrosis  2/21 - CT chest >>> bilateral lower lobe bronchiectasis, multiple tree in bud nodules in right lung compatible with infectious or inflammatory process, potential septic emboli 2/22 - echo >>> EF 93-57%, grade 1 diastolic dysfunction, no comment on thrombi  MICRO DATA  UCx 2/21 >>> neg  BCx 2/21 >>> neg Viral panel 2/21 >>> RSV POSITIVE  ANTIBIOTICS Levaquin 2/20 >>> Stop date of 3/6 Aztreonam 2/20 >>> 2/26 Vanc 2/21 >>> 2/23  CONSULTS ID Nephrology  TUBES / LINES Right HD Cath 2/21 >>> 2/24   Discharge Exam: General: Pleasant obese male in NAD. Neuro: A&O X 3, mood and affect appropriate.  HEENT: [place] in place Cardiovascular: RRR, no  M/R/G appreciated with respect to body habitus.  Lungs: Scattered intermittent ronchi, otherwise CTA. Abdomen: Obese, NT +BS  Musculoskeletal: Mild edema BLE, non pitting  Skin: Intact   Filed Vitals:   11/29/13 1500 11/29/13 1657 11/29/13 2134 11/30/13 0524  BP: 139/73  125/76 115/73  Pulse: 109 121 101 112  Temp: 98.1 F (36.7 C)  97.7 F (36.5 C) 97.5 F (36.4 C)  TempSrc: Oral  Oral Oral  Resp: 20  18 20   Height:      Weight:    322 lb 3.2 oz (146.149 kg)  SpO2: 94% 91% 91% 91%     Discharge Labs  BMET  Recent Labs Lab 11/25/13 0200 11/25/13 0340  11/27/13 0515 11/27/13 1840 11/28/13 0533 11/29/13 0500 11/30/13 0412   NA 129* 132*  < > 139 142 143 144 142  K 5.8* 6.0*  < > 3.5* 4.3 4.7 4.0 4.2  CL 96 97  < > 101 103 103 104 104  CO2 11* 12*  < > 22 19 24 23 22   GLUCOSE 373* 405*  < > 167* 200* 225* 219* 219*  BUN 76* 80*  < > 74* 65* 67* 53* 46*  CREATININE 5.07* 5.13*  < > 2.91* 2.02* 1.84* 1.48* 1.50*  CALCIUM 8.3* 8.6  < > 7.1* 7.7* 8.6 9.1 9.7  MG 2.1 2.2  --   --   --   --  2.3  --   PHOS 4.7* 5.1*  --   --   --   --  3.5  --   < > = values in this interval not displayed.  CBC  Recent Labs Lab 11/25/13 0340 11/26/13 0520 11/29/13 0500  HGB 10.0* 9.1* 9.9*  HCT 30.4* 26.7* 30.2*  WBC 13.5* 10.7* 13.3*  PLT 349 307 324    Anti-Coagulation  Recent Labs Lab 11/25/13 0200  INR 1.16    Discharge Orders   Future Orders Complete By Expires   Diet - low sodium heart healthy  As directed    Discharge instructions  As directed    Comments:     STOP taking Metformin, Lisinopril, and all NSAID's (Ibuprofen, Aleve, etc). If you become short of breath, call [phone] immediately.  Please call and schedule follow up appointments with Pulmonary, Nephrology, and your PCP.   Increase activity slowly  As directed          Follow-up Information   Follow up with VYAS,DHRUV B., MD In 1 week. (Please make a follow up appointment with Dr. Woody Seller for your Diabetes management.)    Specialty:  Internal Medicine   Contact information:   [place] [address] [phone]       Follow up with [facility]. (The office will call you to set up an appointment in 7 - 10 days.)    Specialty:  Pulmonology   Contact information:   [place] [address] [phone]      Follow up with Sherril Croon, MD. (Please call to schedule a follow up appointment with Dr. Justin Mend regarding your ELISA Test.)    Specialty:  Nephrology   Contact information:   [address] [place] [address] [phone]        Medication List    STOP taking these medications       ibuprofen  200 MG tablet  Commonly known as:  ADVIL,MOTRIN     lisinopril 10 MG tablet  Commonly known as:  PRINIVIL,ZESTRIL  metFORMIN 500 MG tablet  Commonly known as:  GLUCOPHAGE     NOVOFINE 32G X 6 MM Misc  Generic drug:  Insulin Pen Needle     Potassium Gluconate 550 MG Tabs      TAKE these medications       atorvastatin 80 MG tablet  Commonly known as:  LIPITOR  Take 80 mg by mouth At bedtime.     Fluticasone Furoate-Vilanterol 100-25 MCG/INH Aepb  Inhale 1 puff into the lungs daily.     furosemide 40 MG tablet  Commonly known as:  LASIX  Take 40 mg by mouth daily as needed for fluid. Takes with potassium     gabapentin 300 MG capsule  Commonly known as:  NEURONTIN  Take 300 mg by mouth 2 (two) times daily.     INVOKANA 300 MG Tabs  Generic drug:  Canagliflozin  Take 300 mg by mouth daily.     LEVEMIR 100 UNIT/ML injection  Generic drug:  insulin detemir  Inject 70 Units into the skin 2 (two) times daily. Use as directed (will replace Lantus)     levofloxacin 500 MG tablet  Commonly known as:  LEVAQUIN  Take 1 tablet (500 mg total) by mouth daily.     NOVOLOG FLEXPEN 100 UNIT/ML injection  Generic drug:  insulin aspart  Inject 20-24 Units into the skin 3 (three) times daily with meals. 20 units every morning and 24 units every evening     oxymetazoline 0.05 % nasal spray  Commonly known as:  AFRIN  Place 1-2 sprays into the nose 2 (two) times daily.     potassium chloride 10 MEQ tablet  Commonly known as:  K-DUR,KLOR-CON  Take 10 mEq by mouth as needed. Take with furosemide     sertraline 100 MG tablet  Commonly known as:  ZOLOFT  Take 200 mg by mouth daily.     VENTOLIN HFA 108 (90 BASE) MCG/ACT inhaler  Generic drug:  albuterol  Inhale 2 puffs into the lungs every 6 (six) hours as needed.     VICTOZA 18 MG/3ML Sopn  Generic drug:  Liraglutide  Inject 1.8 Units into the skin daily.          Disposition: Home.  Discharged Condition: Zeus Marquis has met maximum benefit of inpatient care and is medically stable and cleared for discharge.  Patient is pending follow up as above.      Time spent on disposition:  Greater than 35 minutes.   Montey Hora, [facility] Pgr: [phone]  or [phone]  Independently examined pt, evaluated data & formulated above discharge care plan with NP who scribed this note & edited by me.  Mariell Nester V.

## 2013-11-29 NOTE — Progress Notes (Addendum)
Inpatient Diabetes Program Recommendations  AACE/ADA: New Consensus Statement on Inpatient Glycemic Control (2013)  Target Ranges:  Prepandial:   less than 140 mg/dL      Peak postprandial:   less than 180 mg/dL (1-2 hours)      Critically ill patients:  140 - 180 mg/dL     Results for ERCEL, PEPITONE (MRN [id number]) as of 11/29/2013 09:01  Ref. Range 11/28/2013 08:43 11/28/2013 11:33 11/28/2013 17:22 11/28/2013 21:13  Glucose-Capillary Latest Range: 70-99 mg/dL 219 (H) 196 (H) 307 (H) 232 (H)    Results for SPERO, GUNNELS (MRN [id number]) as of 11/29/2013 09:01  Ref. Range 11/29/2013 08:03  Glucose-Capillary Latest Range: 70-99 mg/dL 216 (H)     Home DM meds:   Levemir 70 units bid   Novolog 20 units tid with meals  Victoza 1.8 mg daily  Metformin 1000 mg bid  Invokana 30 mg QD   Hospital regimen:  Lantus 45 units daily Novolog Resistant SSI tid ac + HS   **MD- Please consider the following in-hospital insulin adjustments:  1. Change basal insulin to Levemir 30 units bid (currently ordered as Lantus 45 units daily- Patient takes Levemir 70 units bid at home) 2. Add Novolog 6 units tid with meals (meal coverage) (Patient takes Novolog 20 units tid with meals at home)   Will follow. Wyn Quaker RN, MSN, CDE Diabetes Coordinator Inpatient Diabetes Program Team Pager: [phone] (8a-10p)

## 2013-11-30 ENCOUNTER — Telehealth: Payer: Self-pay | Admitting: Physician Assistant

## 2013-11-30 DIAGNOSIS — G4733 Obstructive sleep apnea (adult) (pediatric): Secondary | ICD-10-CM

## 2013-11-30 LAB — BASIC METABOLIC PANEL
BUN: 46 mg/dL — ABNORMAL HIGH (ref 6–23)
CO2: 22 mEq/L (ref 19–32)
Calcium: 9.7 mg/dL (ref 8.4–10.5)
Chloride: 104 mEq/L (ref 96–112)
Creatinine, Ser: 1.5 mg/dL — ABNORMAL HIGH (ref 0.50–1.35)
GFR, EST AFRICAN AMERICAN: 56 mL/min — AB (ref >90)
GFR, EST NON AFRICAN AMERICAN: 48 mL/min — AB (ref >90)
Glucose, Bld: 219 mg/dL — ABNORMAL HIGH (ref 70–99)
POTASSIUM: 4.2 meq/L (ref 3.7–5.3)
Sodium: 142 mEq/L (ref 137–147)

## 2013-11-30 LAB — GLUCOSE, CAPILLARY
Glucose-Capillary: 211 mg/dL — ABNORMAL HIGH (ref 70–99)
Glucose-Capillary: 284 mg/dL — ABNORMAL HIGH (ref 70–99)

## 2013-11-30 LAB — HISTOPLASMA ANTIGEN, URINE: HISTOPLASMA ANTIGEN URINE: 1 ng/mL — AB (ref <0.5)

## 2013-11-30 MED ORDER — LEVOFLOXACIN 500 MG PO TABS: 500.0000 mg | ORAL_TABLET | ORAL | Status: DC

## 2013-11-30 NOTE — Progress Notes (Signed)
SATURATION QUALIFICATIONS: (This note is used to comply with regulatory documentation for home oxygen)  Patient Saturations on Room Air at Rest =90%  Patient Saturations on Room Air while Ambulating = 86%  Patient Saturations on 2 Liters of oxygen while Ambulating = 94%  Please briefly explain why patient needs home oxygen:

## 2013-11-30 NOTE — Care Management Note (Signed)
    Page 1 of 1   11/30/2013     11:22:06 AM   CARE MANAGEMENT NOTE 11/30/2013  Patient:  Kenneth Patrick, Kenneth Patrick   Account Number:  [id number]  Date Initiated:  11/30/2013  Documentation initiated by:  Tomi Bamberger  Subjective/Objective Assessment:   dx copd, renal failure, pna, dm  admit- lives with spouse.     Action/Plan:   pt eval- rec hhpt, but patient declined   Anticipated DC Date:  11/30/2013   Anticipated DC Plan:  [place]  CM consult      Choice offered to / List presented to:             Status of service:  Completed, signed off Medicare Important Message given?   (If response is "NO", the following Medicare IM given date fields will be blank) Date Medicare IM given:   Date Additional Medicare IM given:    Discharge Disposition:  HOME/SELF CARE  Per UR Regulation:  Reviewed for med. necessity/level of care/duration of stay  If discussed at [place] of Stay Meetings, dates discussed:    Comments:  11/30/13 [place], BSN [phone] patient lives with spouse, RN NCM informed RN need sats to check to see if patient needs home oxygen.  Per physical therapy recs hhpt, but patient declined hhpt.

## 2013-11-30 NOTE — Progress Notes (Signed)
[facility] ROUNDING NOTE   Subjective:   Interval History: no new issues   Objective:  Vital signs in last 24 hours:  Temp:  [97.5 F (36.4 C)-98.1 F (36.7 C)] 97.5 F (36.4 C) (02/26 0524) Pulse Rate:  [101-121] 112 (02/26 0524) Resp:  [18-20] 20 (02/26 0524) BP: (115-139)/(73-76) 115/73 mmHg (02/26 0524) SpO2:  [91 %-94 %] 91 % (02/26 0524) Weight:  [146.149 kg (322 lb 3.2 oz)] 146.149 kg (322 lb 3.2 oz) (02/26 0524)  Weight change: -0.181 kg (-6.4 oz) Filed Weights   11/28/13 0436 11/29/13 0544 11/30/13 0524  Weight: 145.8 kg (321 lb 6.9 oz) 146.33 kg (322 lb 9.6 oz) 146.149 kg (322 lb 3.2 oz)    Intake/Output: I/O last 3 completed shifts: In: 390 [P.O.:240; IV Piggyback:150] Out: 300 [Urine:300]   Intake/Output this shift:     CVS- RRR RS- CTA ABD- BS present soft non-distended EXT- no edema   Basic Metabolic Panel:  Recent Labs Lab 11/25/13 0200 11/25/13 0340  11/27/13 0515 11/27/13 1840 11/28/13 0533 11/29/13 0500 11/30/13 0412  NA 129* 132*  < > 139 142 143 144 142  K 5.8* 6.0*  < > 3.5* 4.3 4.7 4.0 4.2  CL 96 97  < > 101 103 103 104 104  CO2 11* 12*  < > 22 19 24 23 22   GLUCOSE 373* 405*  < > 167* 200* 225* 219* 219*  BUN 76* 80*  < > 74* 65* 67* 53* 46*  CREATININE 5.07* 5.13*  < > 2.91* 2.02* 1.84* 1.48* 1.50*  CALCIUM 8.3* 8.6  < > 7.1* 7.7* 8.6 9.1 9.7  MG 2.1 2.2  --   --   --   --  2.3  --   PHOS 4.7* 5.1*  --   --   --   --  3.5  --   < > = values in this interval not displayed.  Liver Function Tests:  Recent Labs Lab 11/25/13 0200  AST 20  ALT 22  ALKPHOS 127*  BILITOT <0.2*  PROT 7.0  ALBUMIN 2.2*   No results found for this basename: LIPASE, AMYLASE,  in the last 168 hours No results found for this basename: AMMONIA,  in the last 168 hours  CBC:  Recent Labs Lab 11/25/13 0200 11/25/13 0340 11/26/13 0520 11/29/13 0500  WBC 16.0* 13.5* 10.7* 13.3*  NEUTROABS 14.8*  --   --   --   HGB 9.5* 10.0*  9.1* 9.9*  HCT 29.4* 30.4* 26.7* 30.2*  MCV 88.3 86.9 84.8 86.5  PLT 309 349 307 324    Cardiac Enzymes:  Recent Labs Lab 11/25/13 0200 11/25/13 0602 11/25/13 1224  TROPONINI <0.30 <0.30 <0.30    BNP: No components found with this basename: POCBNP,   CBG:  Recent Labs Lab 11/29/13 1128 11/29/13 1738 11/29/13 1756 11/29/13 2130 11/30/13 0752  GLUCAP 274* 270* 274* 246* 211*    Microbiology: Results for orders placed during the hospital encounter of 11/24/13  MRSA PCR SCREENING     Status: None   Collection Time    11/24/13 11:42 PM      Result Value Ref Range Status   MRSA by PCR NEGATIVE  NEGATIVE Final   Comment:            The GeneXpert MRSA Assay (FDA     approved for NASAL specimens     only), is one component of a     comprehensive MRSA colonization  surveillance program. It is not     intended to diagnose MRSA     infection nor to guide or     monitor treatment for     MRSA infections.  RESPIRATORY VIRUS PANEL     Status: Abnormal   Collection Time    11/25/13 12:50 AM      Result Value Ref Range Status   Source - RVPAN NASAL SWAB   Corrected   Comment: CORRECTED ON 02/23 AT 1927: PREVIOUSLY REPORTED AS NASAL SWAB   Respiratory Syncytial Virus A NOT DETECTED   Final   Respiratory Syncytial Virus B DETECTED (*)  Final   Influenza A NOT DETECTED   Final   Influenza B NOT DETECTED   Final   Parainfluenza 1 NOT DETECTED   Final   Parainfluenza 2 NOT DETECTED   Final   Parainfluenza 3 NOT DETECTED   Final   Metapneumovirus NOT DETECTED   Final   Rhinovirus NOT DETECTED   Final   Adenovirus NOT DETECTED   Final   Influenza A H1 NOT DETECTED   Final   Influenza A H3 NOT DETECTED   Final   Comment: (NOTE)           Normal Reference Range for each Analyte: NOT DETECTED     Testing performed using the Luminex xTAG Respiratory Viral Panel test     kit.     This test was developed and its performance characteristics determined     by [employer]. It has not been cleared or approved by the [place]     Food and Drug Administration. This test is used for clinical purposes.     It should not be regarded as investigational or for research. This     laboratory is certified under the [place] (CLIA) as qualified to perform high complexity     clinical laboratory testing.     Performed at [facility], BLOOD (ROUTINE X 2)     Status: None   Collection Time    11/25/13  1:54 AM      Result Value Ref Range Status   Specimen Description BLOOD RIGHT ARM   Final   Special Requests BOTTLES DRAWN AEROBIC ONLY 2CC   Final   Culture  Setup Time     Final   Value: 11/25/2013 12:01     Performed at [facility]   Culture     Final   Value:        BLOOD CULTURE RECEIVED NO GROWTH TO DATE CULTURE WILL BE HELD FOR 5 DAYS BEFORE ISSUING A FINAL NEGATIVE REPORT     Performed at [facility]   Report Status PENDING   Incomplete  CULTURE, BLOOD (ROUTINE X 2)     Status: None   Collection Time    11/25/13  2:00 AM      Result Value Ref Range Status   Specimen Description BLOOD RIGHT HAND   Final   Special Requests BOTTLES DRAWN AEROBIC ONLY 10CC   Final   Culture  Setup Time     Final   Value: 11/25/2013 12:01     Performed at [facility]   Culture     Final   Value:        BLOOD CULTURE RECEIVED NO GROWTH TO DATE CULTURE WILL BE HELD FOR 5 DAYS BEFORE ISSUING A FINAL NEGATIVE REPORT  Performed at [facility]   Report Status PENDING   Incomplete  URINE CULTURE     Status: None   Collection Time    11/25/13  2:19 AM      Result Value Ref Range Status   Specimen Description URINE, CATHETERIZED   Final   Special Requests NONE   Final   Culture  Setup Time     Final   Value: 11/25/2013 12:13     Performed at [facility]     Final   Value: NO GROWTH     Performed at [facility]   Culture     Final   Value: NO  GROWTH     Performed at [facility]   Report Status 11/26/2013 FINAL   Final    Coagulation Studies: No results found for this basename: LABPROT, INR,  in the last 72 hours  Urinalysis:  Recent Labs  11/29/13 1452  COLORURINE YELLOW  LABSPEC 1.027  PHURINE 6.0  GLUCOSEU >1000*  HGBUR SMALL*  BILIRUBINUR NEGATIVE  KETONESUR NEGATIVE  PROTEINUR NEGATIVE  UROBILINOGEN 0.2  NITRITE NEGATIVE  LEUKOCYTESUR NEGATIVE      Imaging: Dg Chest 2 View  11/29/2013   CLINICAL DATA:  Shortness of breath.  Follow-up pneumonia.  EXAM: CHEST  2 VIEW  COMPARISON:  11/27/2013  FINDINGS: The right jugular central venous catheter has been removed. Again noted are patchy densities throughout both lungs, right side greater than left. Again noted is a nodular density in the right upper lung that roughly measures 1.3 cm. Heart size is within normal limits. The trachea is midline. Cannot exclude small pleural effusions. Improved aeration at the lung bases compared to the prior examination.  IMPRESSION: Persistent patchy parenchymal densities in both lungs, right side greater the left. Findings remain compatible with an infectious or inflammatory process. Recommend follow up to ensure resolution, particularly of the nodular lesion in the right upper lung.   Electronically Signed   By: Markus Daft M.D.   On: 11/29/2013 08:04     Medications:     . albuterol  2.5 mg Nebulization TID  . antiseptic oral rinse  15 mL Mouth Rinse q12n4p  . budesonide-formoterol  2 puff Inhalation BID  . chlorhexidine  15 mL Mouth Rinse BID  . gabapentin  300 mg Oral BID  . heparin  5,000 Units Subcutaneous 3 times per day  . insulin aspart  0-20 Units Subcutaneous TID WC  . insulin aspart  0-5 Units Subcutaneous QHS  . insulin glargine  45 Units Subcutaneous Daily  . levofloxacin  500 mg Oral Q24H  . oxymetazoline  1 spray Each Nare BID  . predniSONE  30 mg Oral Q breakfast  . sertraline  200 mg Oral Daily    albuterol  Assessment/ Plan:  Pt is a 63yo M with PMH sig for COPD, DM, HTN, who presented to [facility] hospital with worsening SOB and was transferred to [facility] due to hypoxic respiratory failure and metabolic acidosis in the setting of AKI. Unknown baseline Scr, however the patient had been taking NSAIDs (12 ibuprofen's daily for months) along with ACE-I and metformin PTA.  1. Acute renal failure with great diuresis and renal function improved creatinine now decreased to 1.5 From 1.8  2. Great diuresis urine output  3. Serological testing demonstrated a positive low titer ANCA.Awaiting Elisa test     Elisa testing negative  And  No hematuria   Cr 1.5  Will sign off      LOS: 6 Moises Terpstra W @TODAY @11 :04 AM

## 2013-11-30 NOTE — Progress Notes (Signed)
Patient was discharged home by MD order; discharged instructions  review and give to patient and his wife with care notes; oxygen 2L/min via [place] in place; IV DIC; skin intact; patient will be escorted to the car by nurse tech via wheelchair.

## 2013-11-30 NOTE — Progress Notes (Signed)
Discussed with pt Can dc today Assess for home O2 - ambulate on RA if needed STOP lisinopril & metformin & NSAIDs, can resume other home meds Favor infectious over inflamm etiology -   Levaquin x 2 weeks total Needs FU appt with - PCCM (TP) in 7-10 ds for FU CXR, if no resolution of infx  in 6 wks will consider further testing - PCP Vyas for DM management - renal in 2-4 wks to FU ELISA p-ANCA  Mry Lamia V.

## 2013-11-30 NOTE — Telephone Encounter (Signed)
Pt needs follow up appointment with Tammy in 7 - 10 days, repeat CXR.

## 2013-12-01 ENCOUNTER — Telehealth: Payer: Self-pay | Admitting: Pulmonary Disease

## 2013-12-01 LAB — CULTURE, BLOOD (ROUTINE X 2)
Culture: NO GROWTH
Culture: NO GROWTH

## 2013-12-01 MED ORDER — LEVOFLOXACIN 500 MG PO TABS: 500.0000 mg | ORAL_TABLET | ORAL | Status: DC

## 2013-12-01 NOTE — Telephone Encounter (Signed)
[id number] x1 

## 2013-12-01 NOTE — Telephone Encounter (Signed)
Pt spouse returned call. She called on call doc last night and Levaquin 500 mg was suppose to be called in. Never done. It is in EPIC but shows no print. I have re sent RX in for pt. Nothing further needed  Allergies  Allergen Reactions  . Penicillins     Childhood allergy

## 2013-12-02 LAB — FUNGAL ANTIBODIES PANEL, ID-BLOOD
Aspergillus Flavus Antibodies: NEGATIVE
Aspergillus Niger Antibodies: NEGATIVE
Aspergillus fumigatus: NEGATIVE
Blastomyces Abs, Qn, DID: NEGATIVE
Coccidioides Antibody ID: NEGATIVE
Histoplasma Ab, Immunodiffusion: NEGATIVE

## 2013-12-07 ENCOUNTER — Telehealth: Payer: Self-pay | Admitting: Pulmonary Disease

## 2013-12-07 NOTE — Telephone Encounter (Signed)
Pt has an appt on 12-13-13. [place] Bing, CMA

## 2013-12-07 NOTE — Telephone Encounter (Signed)
Duplicate message. See phone note from 11/30/13. [place] Bing, CMA

## 2013-12-08 ENCOUNTER — Inpatient Hospital Stay: Payer: 59 | Admitting: Adult Health

## 2013-12-13 ENCOUNTER — Ambulatory Visit (INDEPENDENT_AMBULATORY_CARE_PROVIDER_SITE_OTHER)
Admission: RE | Admit: 2013-12-13 | Discharge: 2013-12-13 | Disposition: A | Discharge status: Home / Self Care | Payer: 59 | Source: Ambulatory Visit | Attending: Adult Health | Admitting: Adult Health

## 2013-12-13 ENCOUNTER — Ambulatory Visit (INDEPENDENT_AMBULATORY_CARE_PROVIDER_SITE_OTHER): Payer: 59 | Admitting: Adult Health

## 2013-12-13 ENCOUNTER — Encounter: Payer: Self-pay | Admitting: Adult Health

## 2013-12-13 VITALS — BP 106/66 | HR 82 | Temp 98.2°F | Ht 76.0 in | Wt 332.8 lb

## 2013-12-13 DIAGNOSIS — J189 Pneumonia, unspecified organism: Secondary | ICD-10-CM

## 2013-12-13 DIAGNOSIS — J449 Chronic obstructive pulmonary disease, unspecified: Secondary | ICD-10-CM

## 2013-12-13 DIAGNOSIS — G4733 Obstructive sleep apnea (adult) (pediatric): Secondary | ICD-10-CM

## 2013-12-13 IMAGING — CR DG CHEST 2V
2 series · 2 of 2 positions shown · non-contrast
Comparison: none

[view not recorded (1 of 2)]
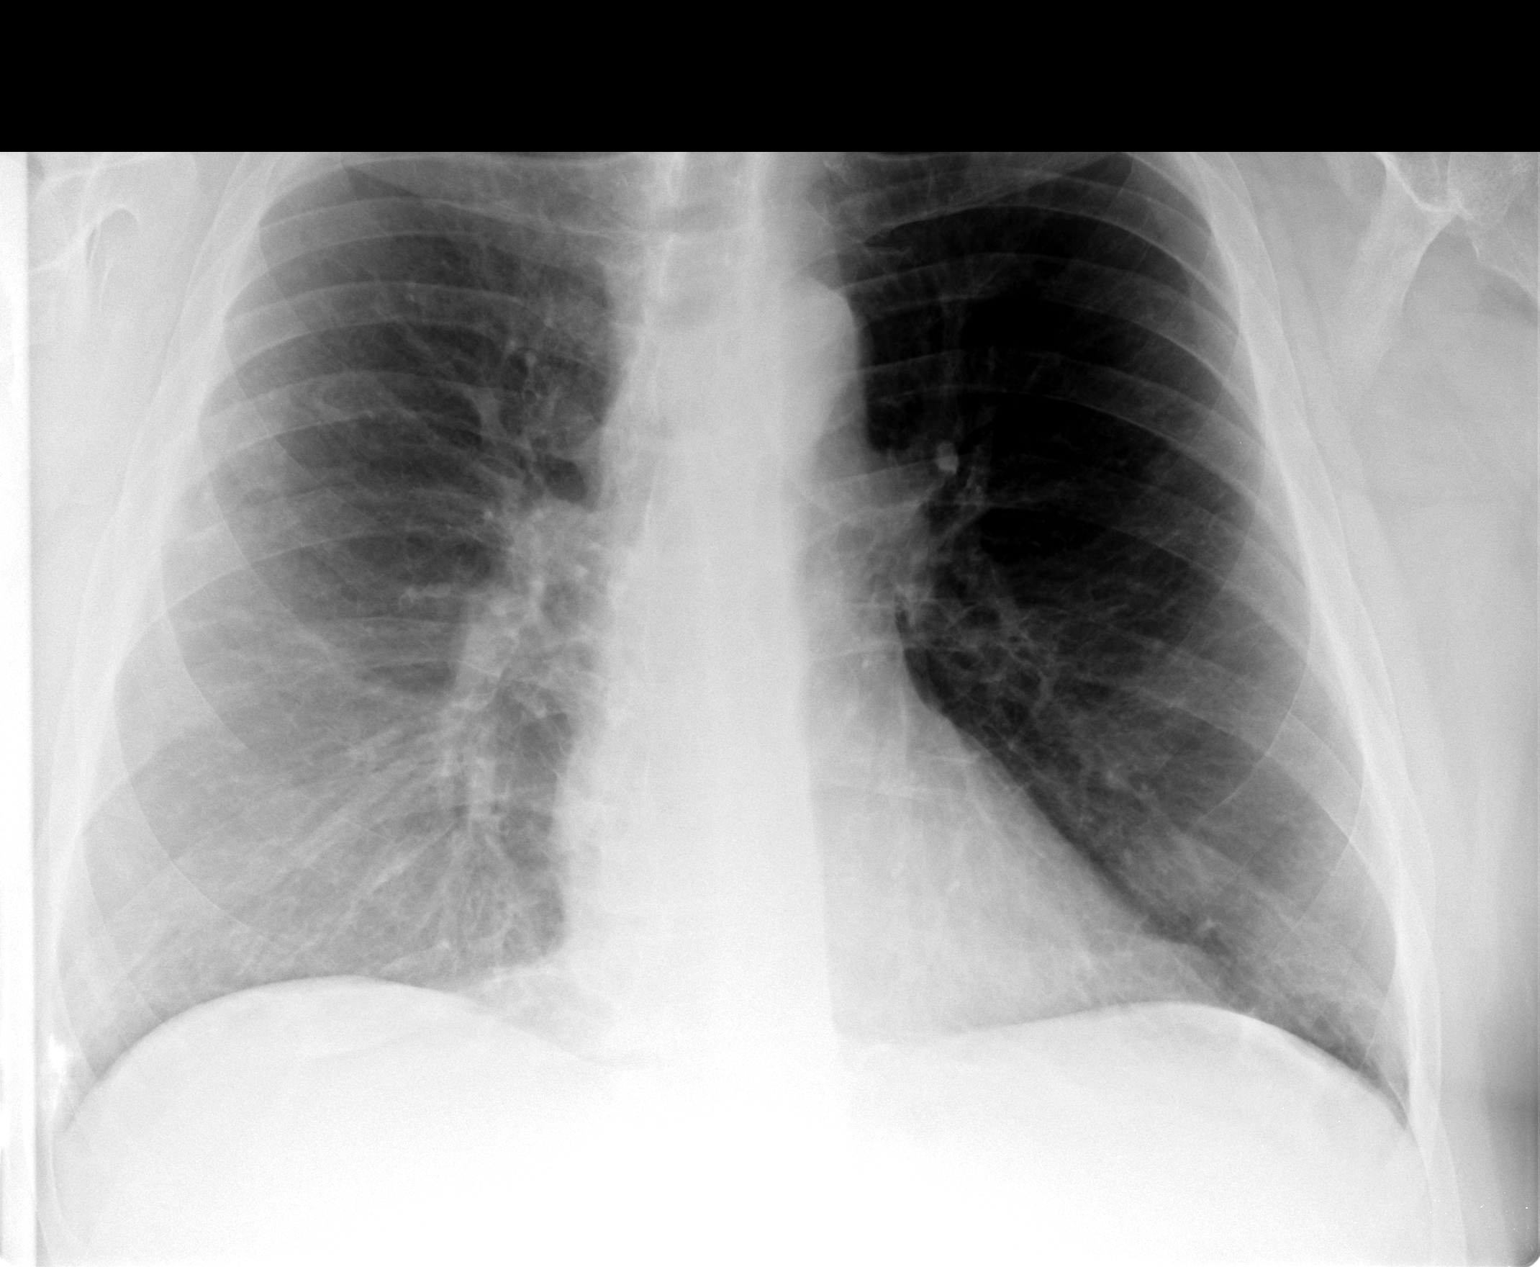

[view not recorded (2 of 2)]
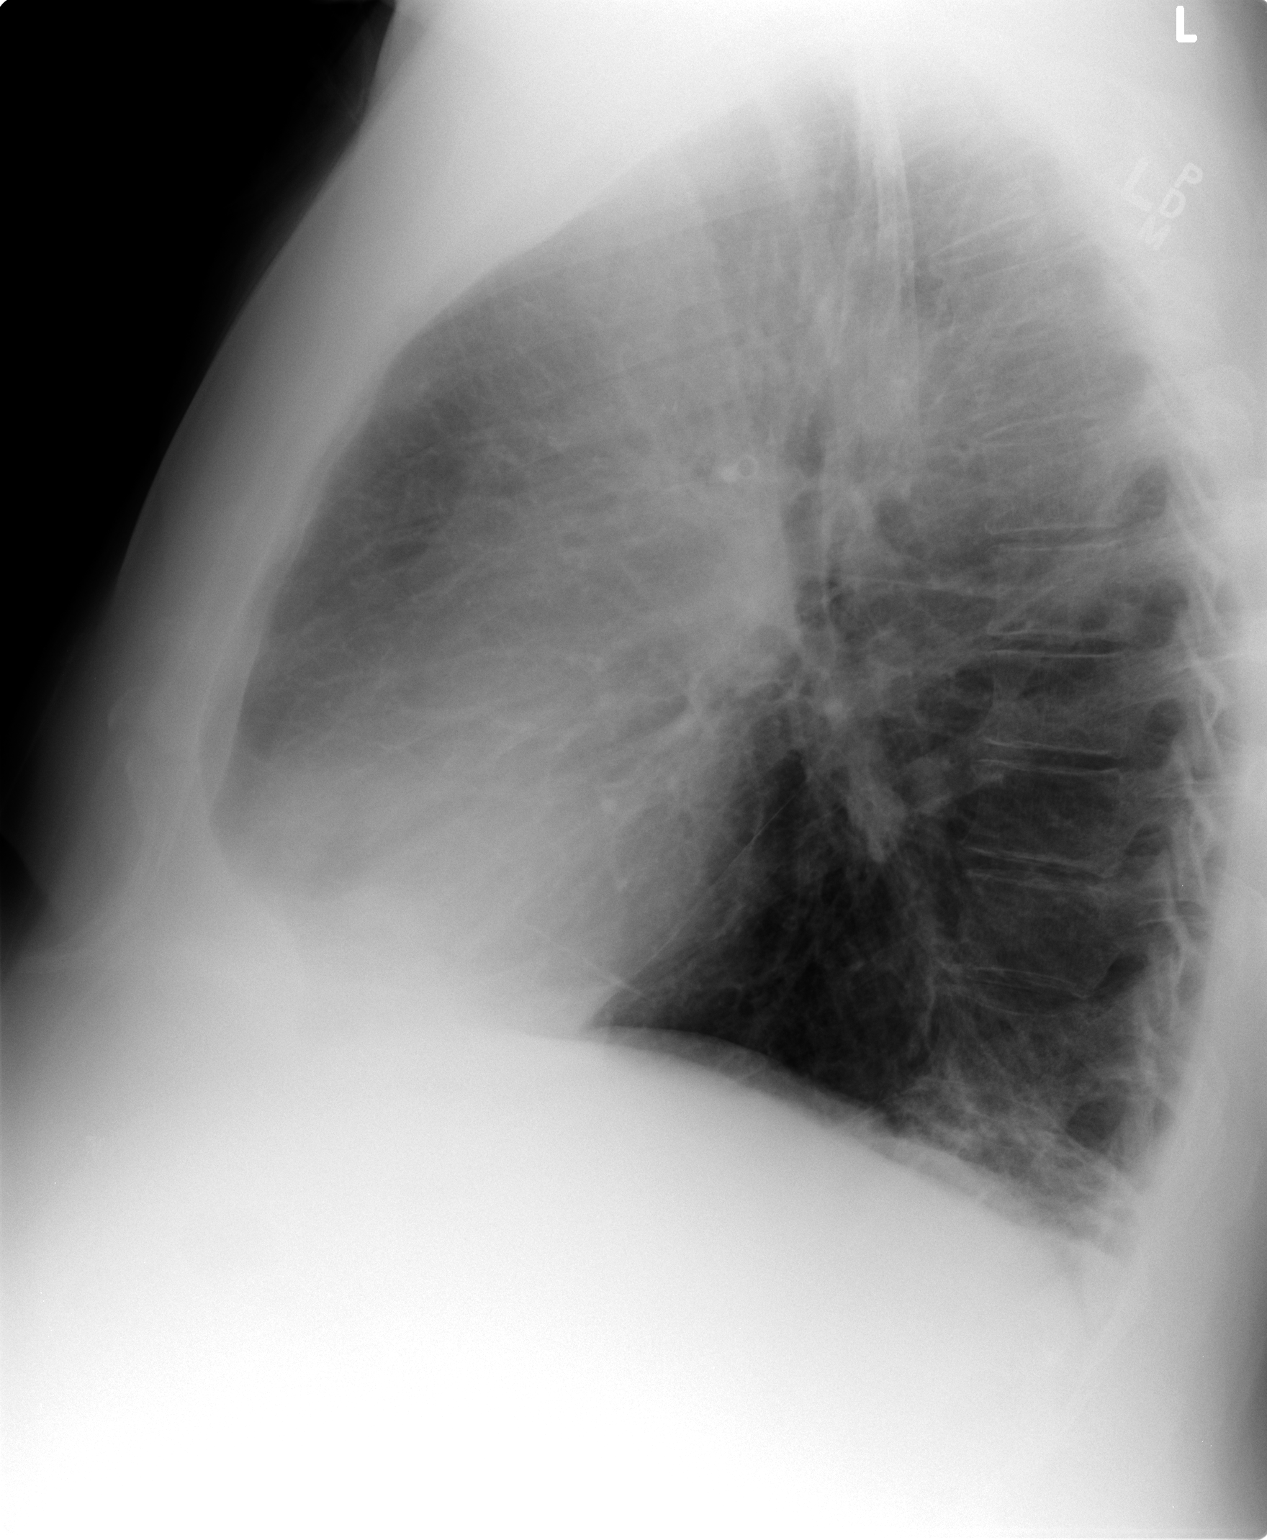

[2 of 2 positions shown; findings below may reference images not displayed]

CLINICAL DATA
Shortness of breath, cough

EXAM
CHEST  2 VIEW

COMPARISON
[DATE]

FINDINGS
Cardiac shadow is stable. The lungs are well aerated bilaterally. No
focal infiltrate is seen. The previously seen nodular densities
continued to improve when compared with the prior exams. No new
focal abnormality is seen.

IMPRESSION
Continued resolution of previously seen nodular densities.

SIGNATURE

## 2013-12-13 NOTE — Assessment & Plan Note (Signed)
COPD -nearing back to his baseline  Plan  Cont on [place]

## 2013-12-13 NOTE — Progress Notes (Signed)
Subjective:    Patient ID: Kenneth Patrick, male    DOB: 09-19-1951, 63 y.o.   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  Initial OV Nov 4 '11  told 'asthma' many years ago, has used inhalers & diskus, Uses ventolin MDI 2-4 puffs 4-5 times/d  -on CPAP x 14 years, compliant , helpful  c/o wheezing on perfumes, cooking  He smoked > 60 Pyrs before quitting in 2006.  Echo in june '11 showed nml LV fn, small pericardial effusion, grossly nml valves. Stress test reportedly nml.  Spirometry showed severe airway obstruction with a low FEV1 ratio & FEV1 of 25 %, good effort !    12/06/2012 82m FU Formulary changed - had to go on dulera instead of symbicort, c/o palpitations & tremors after taking 1 puff of dulera Did not tolerate Spiriva -thought it caused leg swelling.  Overall doing ok , without flare in cough or wheezing  Wears CPAP w/out missed time, wears with naps.  No chest pain or hemoptysis breathing is slight worse, cough w/ occasional phlem, wheezing and chest tx. only used dulera twice-caused high HR, shakes Pt wears CPAP everynight x 6-8 hrs a night. denies any problems w/ mask/machine. feels rested during the day for the most part Gets CPAP supplies from online website  >>spiriva rx   06/08/14  Follow up COPD/OSA  Remains on Symbicort but insurance requires alternative as they will no longer cover Symbicort.   Retried Spiriva last ov ,only took sample . Does not fill he needs it.   Is on ACE inhibitor , denies cough.  Wears CPAP everynight for 6-10 hrs on avg.  Wears with naps. Feels rested most days.  Denies chest pain, orthopnea, edema or hemoptysis.  >>rx BREO in place of Symbicort   12/13/2013 [facility] follow up  Patient returns for a post hospital followup. The patient was admitted February 20-February 26- acute hypoxic respiratory failure and metabolic acidosis in the setting of RSV bronchitis.   Patient did require brief BiPAP support.  Chest x-ray with bilateral pulmonary infiltrates concerning for pneumonia.  CT chest showed bilateral lower lobe bronchiectasis. 2-D echo showed grade 1 diastolic dysfunction with preserved EF.  Viral panel was positive for RSV. Patient was treated empirically with aggressive IV antibiotics. Patient had acute renal failure, and was seen by nephrology. Renal ultrasound showed no hydronephrosis. Seen by ID , HIV neg.  Had a low titer c-ANCA ,  Positive  Histoplasma ag positive . (lived in [place] 10 yrs ago)  Patient was taken off of metformin, lisinopril, and recommended to avoid nonsteroidals  Since discharge feeling much better. Breathing is back to baseline. Mild dry cough , remains.  Still weak, energy slowly returning .  Finished Levaquin last week.   Wants to come off O2. Sats borderline with walking today ~90% on RA   Was seen by PCP last week with labs , reported that kidney fxn ok.   Patient denies any hemoptysis, orthopnea, PND, or increased leg swelling.   Review of Systems  neg for any significant sore throat, dysphagia, itching, sneezing, nasal congestion or excess/ purulent secretions, fever, chills, sweats, unintended wt loss, pleuritic or exertional cp, hempoptysis, orthopnea pnd or change in chronic leg swelling. Also denies presyncope, palpitations, heartburn, abdominal pain, nausea, vomiting, diarrhea or change in bowel or urinary habits, dysuria,hematuria, rash, arthralgias, visual complaints, headache, numbness weakness or ataxia.  Objective:   Physical Exam  Gen. Pleasant, obese, in no distress ENT - no lesions, no post nasal drip Neck: No JVD, no thyromegaly, no carotid bruits Lungs: no use of accessory muscles, no dullness to percussion, few scattered rhonchi  Cardiovascular: Rhythm regular, heart sounds  normal, no murmurs or gallops, no peripheral edema Musculoskeletal: No deformities, no cyanosis or clubbing , no  tremors  Ambulatory walk : O2 sats 90% on RA   CXR 12/13/2013   Cardiac shadow is stable. The lungs are well aerated bilaterally. No focal infiltrate is seen. The previously seen nodular densities continued to improve when compared with the prior exams. No new focal abnormality is seen.     Assessment & Plan:

## 2013-12-13 NOTE — Assessment & Plan Note (Signed)
Restart nocturnal CPAP. Weight loss, encouraged.

## 2013-12-13 NOTE — Patient Instructions (Signed)
Use Oxygen 2l/m with activity .  Restart CPAP At bedtime   Continue on  BREO 1 puff daily  Will be in touch regarding ID clinic referral if needed.  follow up Dr. Elsworth Soho  In 3-4 weeks with chest xray  Please contact office for sooner follow up if symptoms do not improve or worsen or seek emergency care

## 2013-12-13 NOTE — Assessment & Plan Note (Signed)
Critical illness with associated acute respiratory failure, metabolic acidosis, bilateral pulmonary infiltrates in the setting of RSV bronchitis  Patient responded to aggressive IV antibiotics.  Extensive work up with 2D echo showing, grade 1 diastolic dysfunction, with preserved ejection fraction , negative, HIV Low titer, ANCA  was positive.  Histoplama ag was positive .  CXR shows significant improvement  He clinically improving.   Plan  Will repeat cxr in 3 weeks  Discuss with Dr. Elsworth Soho  Regarding need for ID clinic referral .

## 2013-12-20 ENCOUNTER — Telehealth: Payer: Self-pay | Admitting: Adult Health

## 2013-12-20 NOTE — Telephone Encounter (Signed)
Needs referral set up with ID  Post hospital follow up for ID  Was seen during hospital stay

## 2013-12-21 ENCOUNTER — Telehealth: Payer: Self-pay | Admitting: Pulmonary Disease

## 2013-12-21 DIAGNOSIS — J189 Pneumonia, unspecified organism: Secondary | ICD-10-CM

## 2013-12-21 NOTE — Telephone Encounter (Signed)
See last PN  I called and spoke with the pt and notified of recs per TP  He verbalized understanding and I have sent referral to [facility]

## 2013-12-21 NOTE — Telephone Encounter (Signed)
TP had discussed cxr results with pt at the 3.11.15 ov TP had also discussed with RA, hence the ID referral below LMOM TCB x1 for pt

## 2013-12-25 NOTE — Progress Notes (Signed)
Reviewed & agree with plan  

## 2013-12-27 ENCOUNTER — Telehealth: Payer: Self-pay | Admitting: Pulmonary Disease

## 2013-12-27 NOTE — Telephone Encounter (Signed)
[id number] 

## 2013-12-27 NOTE — Telephone Encounter (Signed)
Called and spoke with pt. He was returning a call to triage.  JJ tried calling pt to make aware of ID referral. Pt is already aware. Nothing further needed

## 2013-12-27 NOTE — Telephone Encounter (Signed)
Pt returned call, he has an appt with ID tomorrow 3.26.15 with Carlyle Basques. Will sign off.

## 2013-12-28 ENCOUNTER — Ambulatory Visit (INDEPENDENT_AMBULATORY_CARE_PROVIDER_SITE_OTHER): Payer: 59 | Admitting: Internal Medicine

## 2013-12-28 ENCOUNTER — Encounter: Payer: Self-pay | Admitting: Internal Medicine

## 2013-12-28 ENCOUNTER — Other Ambulatory Visit: Payer: Self-pay | Admitting: Internal Medicine

## 2013-12-28 VITALS — BP 143/78 | HR 88 | Temp 98.3°F | Wt 333.0 lb

## 2013-12-28 DIAGNOSIS — J189 Pneumonia, unspecified organism: Secondary | ICD-10-CM

## 2013-12-28 LAB — CBC WITH DIFFERENTIAL/PLATELET
Basophils Absolute: 0 10*3/uL (ref 0.0–0.1)
Basophils Relative: 0 % (ref 0–1)
EOS ABS: 0.3 10*3/uL (ref 0.0–0.7)
Eosinophils Relative: 5 % (ref 0–5)
HCT: 34.3 % — ABNORMAL LOW (ref 39.0–52.0)
Hemoglobin: 11.4 g/dL — ABNORMAL LOW (ref 13.0–17.0)
LYMPHS ABS: 1.6 10*3/uL (ref 0.7–4.0)
Lymphocytes Relative: 24 % (ref 12–46)
MCH: 27.4 pg (ref 26.0–34.0)
MCHC: 33.2 g/dL (ref 30.0–36.0)
MCV: 82.5 fL (ref 78.0–100.0)
MONOS PCT: 10 % (ref 3–12)
Monocytes Absolute: 0.7 10*3/uL (ref 0.1–1.0)
Neutro Abs: 4 10*3/uL (ref 1.7–7.7)
Neutrophils Relative %: 61 % (ref 43–77)
Platelets: 258 10*3/uL (ref 150–400)
RBC: 4.16 MIL/uL — AB (ref 4.22–5.81)
RDW: 15.8 % — ABNORMAL HIGH (ref 11.5–15.5)
WBC: 6.5 10*3/uL (ref 4.0–10.5)

## 2013-12-28 LAB — COMPLETE METABOLIC PANEL WITH GFR
ALT: 9 U/L (ref 0–53)
AST: 15 U/L (ref 0–37)
Albumin: 3.9 g/dL (ref 3.5–5.2)
Alkaline Phosphatase: 102 U/L (ref 39–117)
BILIRUBIN TOTAL: 1 mg/dL (ref 0.2–1.2)
BUN: 15 mg/dL (ref 6–23)
CO2: 25 meq/L (ref 19–32)
CREATININE: 1.14 mg/dL (ref 0.50–1.35)
Calcium: 9.6 mg/dL (ref 8.4–10.5)
Chloride: 107 mEq/L (ref 96–112)
GFR, EST AFRICAN AMERICAN: 79 mL/min
GFR, EST NON AFRICAN AMERICAN: 69 mL/min
GLUCOSE: 69 mg/dL — AB (ref 70–99)
Potassium: 3.6 mEq/L (ref 3.5–5.3)
Sodium: 143 mEq/L (ref 135–145)
Total Protein: 6.7 g/dL (ref 6.0–8.3)

## 2013-12-28 NOTE — Progress Notes (Signed)
Subjective:    Patient ID: Kenneth Patrick, male    DOB: 08/28/1951, 63 y.o.   MRN: [id number]  HPI  63yo M recently hospitalized for RSV pneumonia found to have nodules on cxr of unknown etiology. Urine histo ag+ but other serum tests negative. He is not back to his full state of health but getting there. Denies cough. Still has some dyspnea on exertion  He grew up in [place] near [place] x 9yr, now in [place] for 10 yrs. He is a retired Higher education careers adviser. Does not have any hobbies that include farming or gardening or fishing Former smoker quit 10 years ago, has emphysema, wears cpap at night  Current Outpatient Prescriptions on File Prior to Visit  Medication Sig Dispense Refill  . albuterol (VENTOLIN HFA) 108 (90 BASE) MCG/ACT inhaler Inhale 2 puffs into the lungs every 6 (six) hours as needed.        [name] atorvastatin (LIPITOR) 80 MG tablet Take 80 mg by mouth At bedtime.       . Fluticasone Furoate-Vilanterol 100-25 MCG/INH AEPB Inhale 1 puff into the lungs daily.      . furosemide (LASIX) 40 MG tablet Take 40 mg by mouth daily as needed for fluid. Takes with potassium      . gabapentin (NEURONTIN) 300 MG capsule Take 300 mg by mouth 2 (two) times daily.       . insulin aspart (NOVOLOG FLEXPEN) 100 UNIT/ML injection 20 units every morning and 24 units every evening      . INVOKANA 300 MG TABS Take 300 mg by mouth daily.       [name] LEVEMIR 100 UNIT/ML injection Inject 70 Units into the skin 2 (two) times daily. Use as directed (will replace Lantus)      . oxymetazoline (AFRIN) 0.05 % nasal spray Place 1-2 sprays into the nose 2 (two) times daily.       . sertraline (ZOLOFT) 100 MG tablet Take 200 mg by mouth daily.       [name] VICTOZA 18 MG/3ML SOLN Inject 1.8 Units into the skin daily.        No current facility-administered medications on file prior to visit.   Active Ambulatory Problems    Diagnosis Date Noted  . DM 04/08/2010  . DYSLIPIDEMIA 04/08/2010  . OBESITY, UNSPECIFIED 04/08/2010  .  ALLERGIC RHINITIS CAUSE UNSPECIFIED 04/08/2010  . ASTHMA, UNSPECIFIED, UNSPECIFIED STATUS 04/08/2010  . C O P D 08/08/2010  . LOW BACK PAIN SYNDROME 04/08/2010  . OSA (obstructive sleep apnea) 04/08/2010  . Respiratory failure 11/24/2013  . AKI (acute kidney injury) 11/24/2013  . Metabolic acidosis 50/53/9767  . Hypotension, unspecified 11/25/2013  . Acute respiratory failure with hypoxia 11/25/2013  . PNA (pneumonia) 12/13/2013   Resolved Ambulatory Problems    Diagnosis Date Noted  . Pure hypercholesterolemia 04/08/2010  . ACUTE URIS OF UNSPECIFIED SITE 04/08/2010  . Other malaise and fatigue 04/08/2010  . DYSPNEA 04/08/2010  . ECHOCARDIOGRAM, ABNORMAL 04/08/2010  . ELEVATED BP READING WITHOUT DX HYPERTENSION 04/08/2010   Past Medical History  Diagnosis Date  . Diabetic neuropathy   . Respiratory infection   . Low back pain   . Asthma   . Dyspnea   . Sleep apnea   . Depression   . DM2 (diabetes mellitus, type 2)   . Hypercholesterolemia   - he reports left eye injury wears a prosthesis but still has eye lid droop. Wears glasses  History  Substance Use Topics  . Smoking status: Former  Smoker -- 1.50 packs/day for 20 years    Quit date: 10/05/2004  . Smokeless tobacco: Never Used  . Alcohol Use: No     Review of Systems Review of Systems  Constitutional: Negative for fever, chills, diaphoresis, activity change, appetite change, fatigue and unexpected weight change.  HENT: Negative for congestion, sore throat, rhinorrhea, sneezing, trouble swallowing and sinus pressure.  Eyes: Negative for photophobia and visual disturbance.  Respiratory: still has some shortness of breath with exertion Cardiovascular: Negative for chest pain, palpitations and leg swelling.  Gastrointestinal: Negative for nausea, vomiting, abdominal pain, diarrhea, constipation, blood in stool, abdominal distention and anal bleeding.  Genitourinary: Negative for dysuria, hematuria, flank pain and  difficulty urinating.  Musculoskeletal: Negative for myalgias, back pain, joint swelling, arthralgias and gait problem.  Skin: Negative for color change, pallor, rash and wound.  Neurological: Negative for dizziness, tremors, weakness and light-headedness.  Hematological: Negative for adenopathy. Does not bruise/bleed easily.  Psychiatric/Behavioral: Negative for behavioral problems, confusion, sleep disturbance, dysphoric mood, decreased concentration and agitation.       Objective:   Physical Exam BP 143/78  Pulse 88  Temp(Src) 98.3 F (36.8 C) (Oral)  Wt 333 lb (151.048 kg)  SpO2 93% Physical Exam  Constitutional: He is oriented to person, place, and time. He appears well-developed and well-nourished. No distress.  HENT:  Mouth/Throat: Oropharynx is clear and moist. No oropharyngeal exudate.  Cardiovascular: Normal rate, regular rhythm and normal heart sounds. Exam reveals no gallop and no friction rub.  No murmur heard.  Pulmonary/Chest: Effort normal and breath sounds normal. No respiratory distress. He has no wheezes.  Abdominal: Soft. Bowel sounds are normal. He exhibits no distension. There is no tenderness.  Lymphadenopathy:  He has no cervical adenopathy.  Neurological: He is alert and oriented to person, place, and time.  Skin: Skin is warm and dry. No rash noted. No erythema.  Psychiatric: He has a normal mood and affect. His behavior is normal.       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 We will repeat serology testing for histo, repeat cxr at next visit. Have him come back in 3 wk to decide whether to treat for pulm histoplasmosis. Will review serial chest imaging to determine changes from recent hospitalization and any worsening of pulmonary nodules. Treatment would include itraconazole. Will see him back in 2-3 wk once repeat testing is completed.

## 2014-01-10 ENCOUNTER — Ambulatory Visit (INDEPENDENT_AMBULATORY_CARE_PROVIDER_SITE_OTHER)
Admission: RE | Admit: 2014-01-10 | Discharge: 2014-01-10 | Disposition: A | Discharge status: Home / Self Care | Payer: 59 | Source: Ambulatory Visit | Attending: Pulmonary Disease | Admitting: Pulmonary Disease

## 2014-01-10 ENCOUNTER — Encounter: Payer: Self-pay | Admitting: Pulmonary Disease

## 2014-01-10 ENCOUNTER — Ambulatory Visit (INDEPENDENT_AMBULATORY_CARE_PROVIDER_SITE_OTHER): Payer: 59 | Admitting: Pulmonary Disease

## 2014-01-10 VITALS — BP 130/70 | HR 92 | Temp 97.0°F | Ht 76.0 in | Wt 337.4 lb

## 2014-01-10 DIAGNOSIS — J449 Chronic obstructive pulmonary disease, unspecified: Secondary | ICD-10-CM

## 2014-01-10 DIAGNOSIS — J189 Pneumonia, unspecified organism: Secondary | ICD-10-CM

## 2014-01-10 DIAGNOSIS — G4733 Obstructive sleep apnea (adult) (pediatric): Secondary | ICD-10-CM

## 2014-01-10 IMAGING — CR DG CHEST 2V
3 series · 3 of 3 positions shown · non-contrast
Comparison: PA and lateral chest [DATE] and [DATE]. CT and
plain film of the chest [DATE].

CLINICAL DATA: History of pneumonia and emphysema.

EXAM:
CHEST  2 VIEW

[view not recorded (1 of 3)]
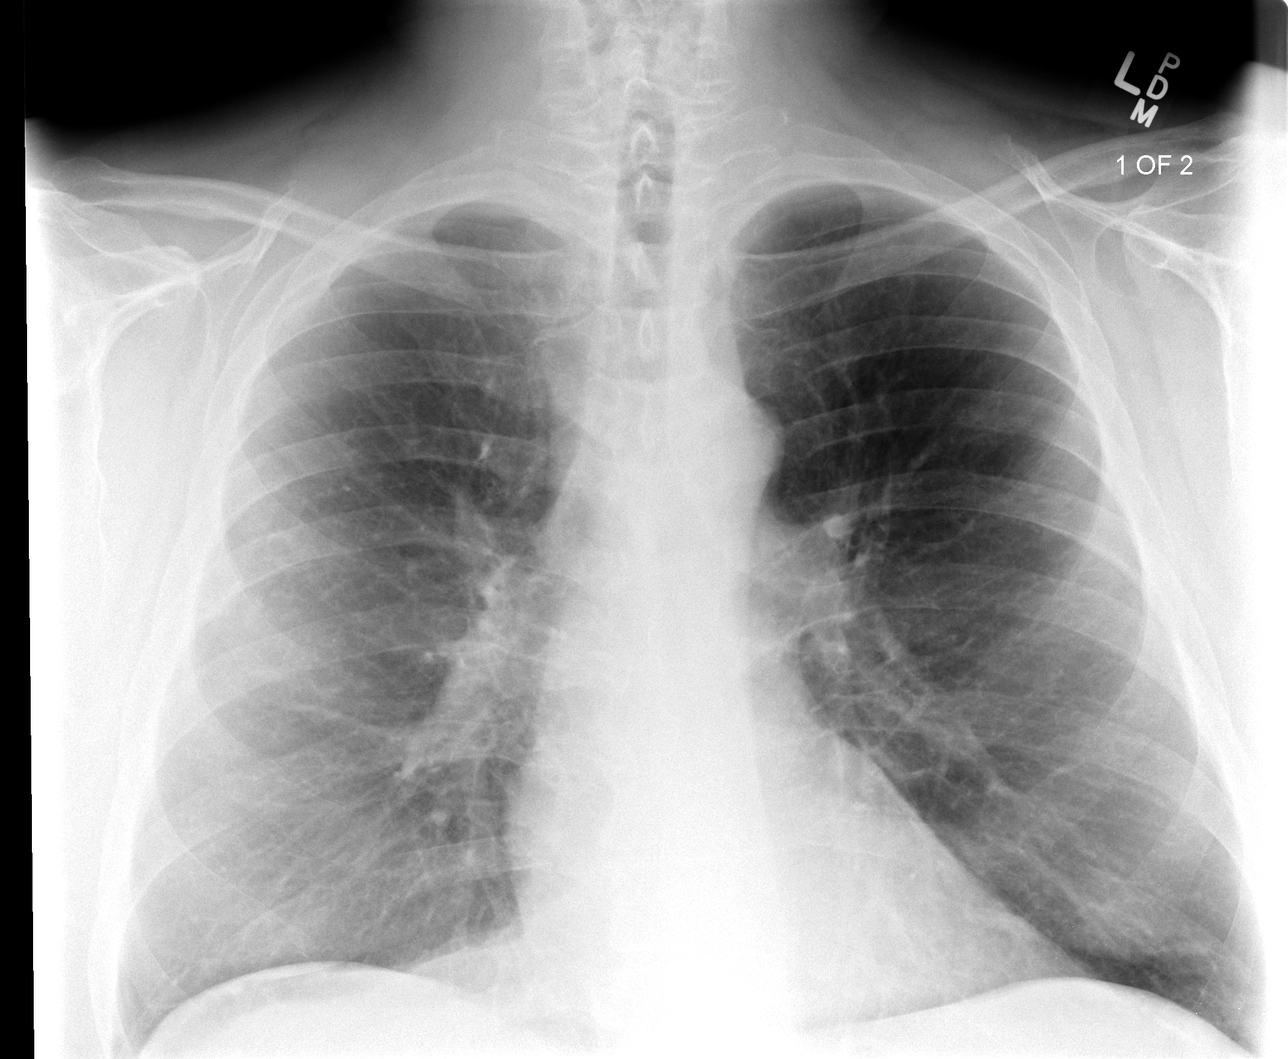

[view not recorded (2 of 3)]
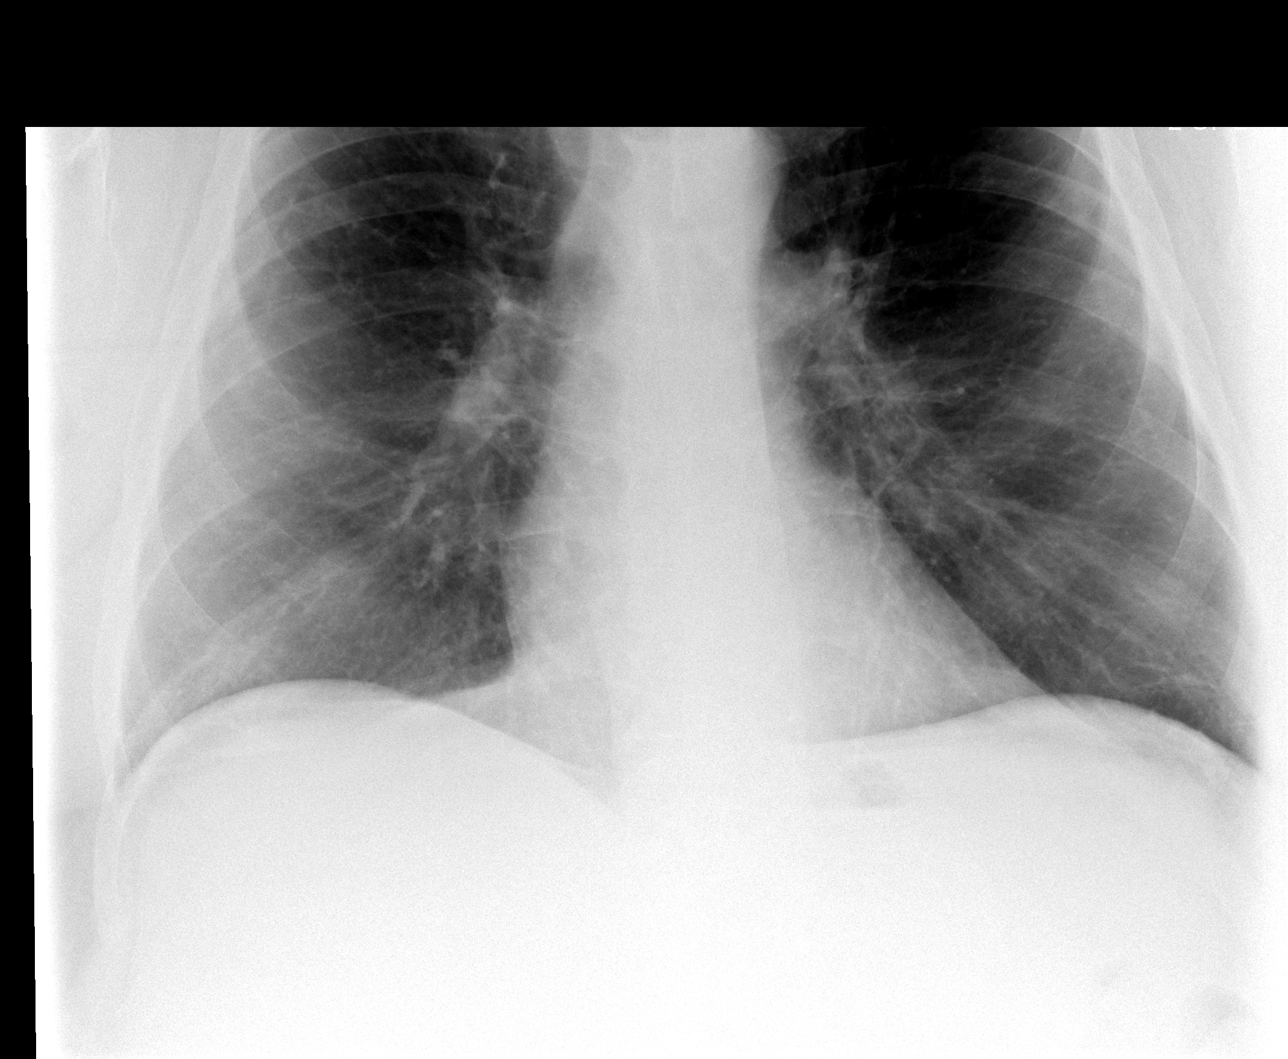

[view not recorded (3 of 3)]
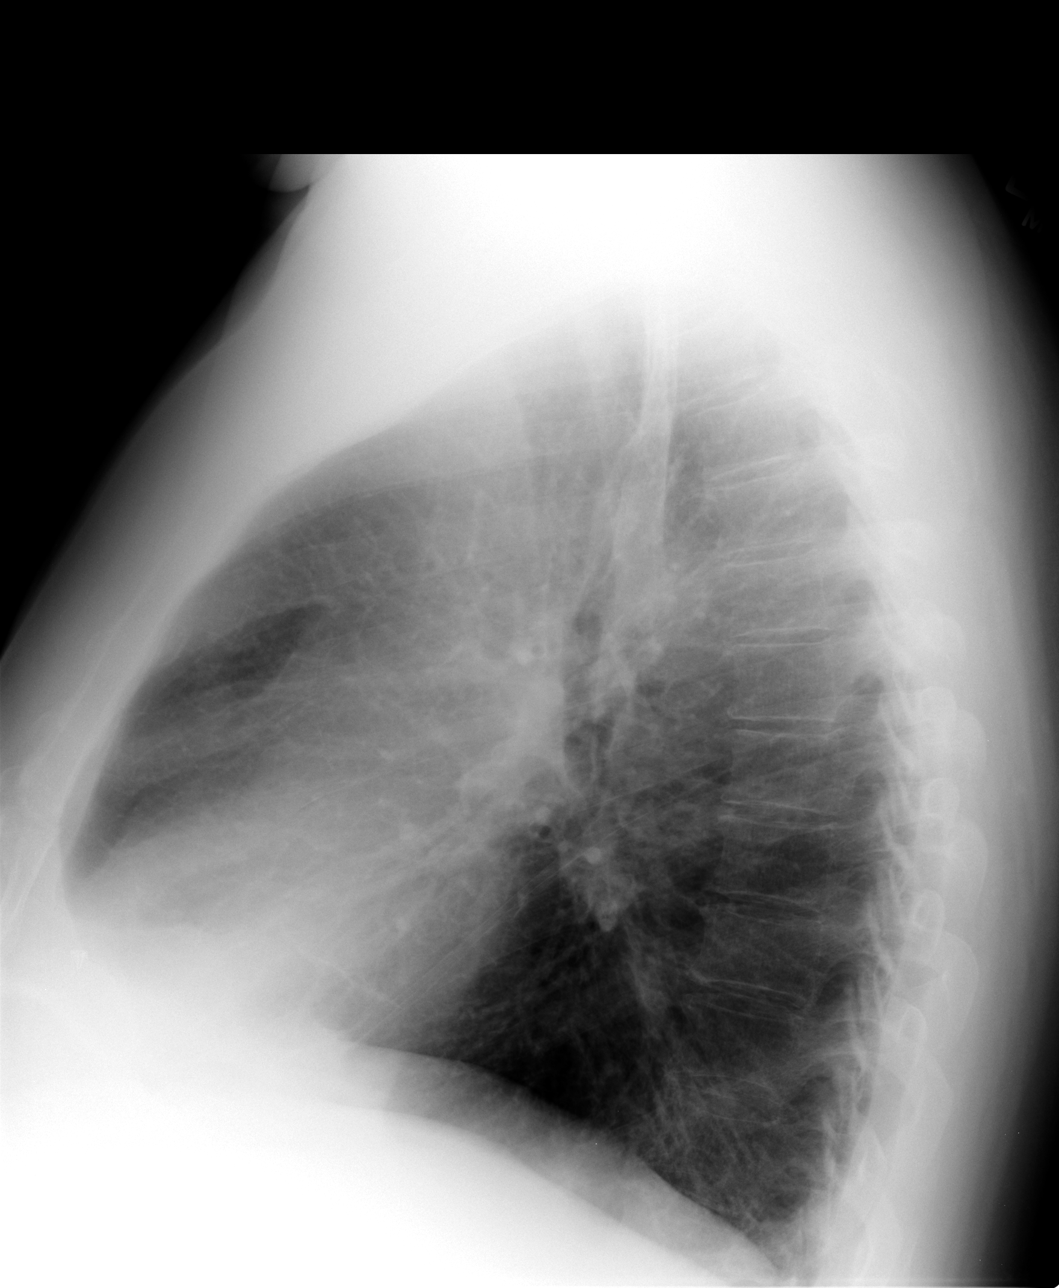

[3 of 3 positions shown; findings below may reference images not displayed]

FINDINGS: Small area of nodular opacities in the right mid lung zone on the PA
view are barely perceptible on today's examination. Left lung is
clear. Heart size is normal. No pneumothorax or pleural effusion.
IMPRESSION: Small cluster of nodular opacities in the right mid lung zone have
nearly completely resolved. No new abnormality

## 2014-01-10 NOTE — Patient Instructions (Signed)
CXR improved Stay on breo Call if breathing worse Stay on cpap

## 2014-01-10 NOTE — Progress Notes (Signed)
   Subjective:    Patient ID: Kenneth Patrick, male    DOB: 20-Jan-1951, 63 y.o.   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  Initial OV Nov 4 '11  told 'asthma' many years ago, has used inhalers & diskus, Uses ventolin MDI 2-4 puffs 4-5 times/d  -on CPAP x 14 years, compliant , helpful  c/o wheezing on perfumes, cooking  He smoked > 60 Pyrs before quitting in 2006.  Echo in june '11 showed nml LV fn, small pericardial effusion, grossly nml valves. Stress test reportedly nml.  Spirometry showed severe airway obstruction with a low  ratio & FEV1 of 25 %, good effort !   Meds -12/2012 -Added spiriva - self dc'd -Does not feel he needs it, thought it caused leg swelling. 06/2013 -Breo  in place of Symbicort   01/10/2014  Admitted February 20-February 26- acute hypoxic respiratory failure and AKI/metabolic acidosis in the setting of RSV bronchitis.  Chest x-ray with bilateral pulmonary infiltrates  CT chest showed bilateral lower lobe bronchiectasis.  2-D echo showed grade 1 diastolic dysfunction with preserved EF.  Viral panel was positive for RSV. Patient was treated empirically with  IV antibiotics.  Patient had acute renal failure, and was seen by nephrology. Renal ultrasound showed no hydronephrosis. Had a low titer c-ANCA , Positive  Seen by ID , HIV neg.  Histoplasma ag positive in urine , but serology neg . (lived in [place] 10 yrs ago)  Patient was taken off of metformin, lisinopril, and recommended to avoid nonsteroidals  Since discharge feeling much better. Breathing is back to baseline. Mild dry cough , remains.    Off O2 BUn/cr 15/1.1 CXR -improved infiltrats Pt wears CPAP everynight x 6-8 hrs a night. denies any problems w/ mask/machine. feels rested during the day for the most part  Gets CPAP supplies from online website   Review of Systems neg for any significant sore throat, dysphagia, itching, sneezing,  nasal congestion or excess/ purulent secretions, fever, chills, sweats, unintended wt loss, pleuritic or exertional cp, hempoptysis, orthopnea pnd or change in chronic leg swelling. Also denies presyncope, palpitations, heartburn, abdominal pain, nausea, vomiting, diarrhea or change in bowel or urinary habits, dysuria,hematuria, rash, arthralgias, visual complaints, headache, numbness weakness or ataxia.     Objective:   Physical Exam  Gen. Pleasant, obese, in no distress ENT - no lesions, no post nasal drip Neck: No JVD, no thyromegaly, no carotid bruits Lungs: no use of accessory muscles, no dullness to percussion, decreased without rales or rhonchi  Cardiovascular: Rhythm regular, heart sounds  normal, no murmurs or gallops, no peripheral edema Musculoskeletal: No deformities, no cyanosis or clubbing , no tremors       Assessment & Plan:

## 2014-01-11 NOTE — Assessment & Plan Note (Signed)
Resolved Doubt histo disease  -does not need to be treated

## 2014-01-11 NOTE — Assessment & Plan Note (Signed)
Ct CPAP

## 2014-01-11 NOTE — Assessment & Plan Note (Signed)
Ct Breo He does not want to participate in IMPACT study Dc O2

## 2014-01-30 ENCOUNTER — Encounter: Payer: Self-pay | Admitting: Internal Medicine

## 2014-01-30 ENCOUNTER — Ambulatory Visit (INDEPENDENT_AMBULATORY_CARE_PROVIDER_SITE_OTHER): Payer: 59 | Admitting: Internal Medicine

## 2014-01-30 VITALS — BP 142/76 | HR 82 | Temp 97.3°F | Wt 333.0 lb

## 2014-01-30 DIAGNOSIS — J189 Pneumonia, unspecified organism: Secondary | ICD-10-CM

## 2014-01-30 NOTE — Progress Notes (Signed)
Subjective:    Patient ID: Kenneth Patrick, male    DOB: 1951/02/16, 63 y.o.   MRN: [id number]  HPI 63yo M recently hospitalized for RSV pneumonia found to have nodules on cxr of unknown etiology. Urine histo ag+ but other serum tests negative. [name] He was seen in our clinic in late March. Since then, he reports complete resolution of cough. His lab work from [facility] lab was negative for histo via immunodiffusion histo ab, and histo antigen. He did see Dr. Elsworth Soho on April 8th where repeat cxr showed near resolution of pulmonary nodules. concensus between dr. Elsworth Soho and myself, is to not pursue treatment since he has symptom resolution   He [place], now in [place] for 10 yrs. He is a retired Higher education careers adviser. Does not have any hobbies that include farming or gardening or fishing  Former smoker quit 10 years ago, has emphysema, wears cpap at night  Current Outpatient Prescriptions on File Prior to Visit  Medication Sig Dispense Refill  . albuterol (VENTOLIN HFA) 108 (90 BASE) MCG/ACT inhaler Inhale 2 puffs into the lungs every 6 (six) hours as needed.        [name] atorvastatin (LIPITOR) 80 MG tablet Take 80 mg by mouth At bedtime.       . Fluticasone Furoate-Vilanterol 100-25 MCG/INH AEPB Inhale 1 puff into the lungs daily.      . furosemide (LASIX) 40 MG tablet Take 40 mg by mouth daily as needed for fluid.       [name] gabapentin (NEURONTIN) 300 MG capsule Take 300 mg by mouth 2 (two) times daily.       . insulin aspart (NOVOLOG FLEXPEN) 100 UNIT/ML injection 20 units every morning and 24 units every evening      . INVOKANA 300 MG TABS Take 300 mg by mouth daily.       [name] LEVEMIR 100 UNIT/ML injection Inject 70 Units into the skin 2 (two) times daily. Use as directed (will replace Lantus)      . oxymetazoline (AFRIN) 0.05 % nasal spray Place 1-2 sprays into the nose 2 (two) times daily as needed.       . sertraline (ZOLOFT) 100 MG tablet Take 200 mg by mouth daily.         [name] VICTOZA 18 MG/3ML SOLN Inject 1.8 Units into the skin daily.        No current facility-administered medications on file prior to visit.   Active Ambulatory Problems    Diagnosis Date Noted  . DM 04/08/2010  . DYSLIPIDEMIA 04/08/2010  . OBESITY, UNSPECIFIED 04/08/2010  . ALLERGIC RHINITIS CAUSE UNSPECIFIED 04/08/2010  . ASTHMA, UNSPECIFIED, UNSPECIFIED STATUS 04/08/2010  . C O P D 08/08/2010  . LOW BACK PAIN SYNDROME 04/08/2010  . OSA (obstructive sleep apnea) 04/08/2010  . Metabolic acidosis 48/54/6270  . Hypotension, unspecified 11/25/2013  . PNA (pneumonia) 12/13/2013   Resolved Ambulatory Problems    Diagnosis Date Noted  . Pure hypercholesterolemia 04/08/2010  . ACUTE URIS OF UNSPECIFIED SITE 04/08/2010  . Other malaise and fatigue 04/08/2010  . DYSPNEA 04/08/2010  . ECHOCARDIOGRAM, ABNORMAL 04/08/2010  . ELEVATED BP READING WITHOUT DX HYPERTENSION 04/08/2010  . Respiratory failure 11/24/2013  . AKI (acute kidney injury) 11/24/2013  . Acute respiratory failure with hypoxia 11/25/2013   Past Medical History  Diagnosis Date  . Diabetic neuropathy   . Respiratory infection   . Low back pain   . Asthma   . Dyspnea   .  Sleep apnea   . Depression   . DM2 (diabetes mellitus, type 2)   . 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 Appears resolved

## 2014-02-04 ENCOUNTER — Other Ambulatory Visit: Payer: Self-pay | Admitting: Pulmonary Disease

## 2014-03-23 ENCOUNTER — Encounter: Payer: Self-pay | Admitting: Adult Health

## 2014-04-24 ENCOUNTER — Other Ambulatory Visit: Payer: Self-pay

## 2014-05-14 ENCOUNTER — Ambulatory Visit: Payer: 59 | Admitting: Adult Health

## 2014-05-16 ENCOUNTER — Ambulatory Visit (INDEPENDENT_AMBULATORY_CARE_PROVIDER_SITE_OTHER): Payer: 59 | Admitting: Adult Health

## 2014-05-16 ENCOUNTER — Encounter: Payer: Self-pay | Admitting: Adult Health

## 2014-05-16 VITALS — BP 134/66 | HR 79 | Temp 98.1°F | Ht 76.0 in | Wt 351.6 lb

## 2014-05-16 DIAGNOSIS — G4733 Obstructive sleep apnea (adult) (pediatric): Secondary | ICD-10-CM

## 2014-05-16 DIAGNOSIS — J449 Chronic obstructive pulmonary disease, unspecified: Secondary | ICD-10-CM

## 2014-05-16 NOTE — Patient Instructions (Signed)
Continue on  CPAP At bedtime   Work on Weight loss as discussed.  Continue on  BREO 1 puff daily  follow up Dr. Elsworth Soho  In 3-4 months   Please contact office for sooner follow up if symptoms do not improve or worsen or seek emergency care

## 2014-05-16 NOTE — Assessment & Plan Note (Signed)
Compensated on present regimen.  Continue on current regimen.

## 2014-05-16 NOTE — Progress Notes (Signed)
   Subjective:    Patient ID: Kenneth Patrick, male    DOB: 07/26/1951, 63 y.o.   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  Initial OV Nov 4 '11  told 'asthma' many years ago, has used inhalers & diskus, Uses ventolin MDI 2-4 puffs 4-5 times/d  -on CPAP x 14 years, compliant , helpful  c/o wheezing on perfumes, cooking  He smoked > 60 Pyrs before quitting in 2006.  Echo in june '11 showed nml LV fn, small pericardial effusion, grossly nml valves. Stress test reportedly nml.  Spirometry showed severe airway obstruction with a low  ratio & FEV1 of 25 %, good effort !   Meds -12/2012 -Added spiriva - self dc'd -Does not feel he needs it, thought it caused leg swelling. 06/2013 -Breo  in place of Symbicort     Admitted February 20-February 26- acute hypoxic respiratory failure and AKI/metabolic acidosis in the setting of RSV bronchitis.  Chest x-ray with bilateral pulmonary infiltrates  CT chest showed bilateral lower lobe bronchiectasis.  2-D echo showed grade 1 diastolic dysfunction with preserved EF.  Viral panel was positive for RSV. Patient was treated empirically with  IV antibiotics.  Patient had acute renal failure, and was seen by nephrology. Renal ultrasound showed no hydronephrosis. Had a low titer c-ANCA , Positive  Seen by ID , HIV neg.  Histoplasma ag positive in urine , but serology neg . (lived in [place] 10 yrs ago)  Patient was taken off of metformin, lisinopril, and recommended to avoid nonsteroidals  Since discharge feeling much better. Breathing is back to baseline. Mild dry cough , remains.    Off O2 BUn/cr 15/1.1 CXR -improved infiltrats Pt wears CPAP everynight x 6-8 hrs a night. denies any problems w/ mask/machine. feels rested during the day for the most part  Gets CPAP supplies from online website  05/16/2014 Follow up COPD and OSA  Pt returns for 4 month follow up .  Reports breathing is  doing well, no new complaints.  stays in the house when the humidity is high.  no refills needed today. Wearing CPAP At bedtime  , no missed night.  Taking BREO daily , says feels good on this.  Wt has trended up , discussed wt loss.  Denies chest pain, orthopnea, edema or fever.     Review of Systems  neg for any significant sore throat, dysphagia, itching, sneezing, nasal congestion or excess/ purulent secretions, fever, chills, sweats, unintended wt loss, pleuritic or exertional cp, hempoptysis, orthopnea pnd or change in chronic leg swelling. Also denies presyncope, palpitations, heartburn, abdominal pain, nausea, vomiting, diarrhea or change in bowel or urinary habits, dysuria,hematuria, rash, arthralgias, visual complaints, headache, numbness weakness or ataxia.     Objective:   Physical Exam   Gen. Pleasant, obese, in no distress ENT - no lesions, no post nasal drip Neck: No JVD, no thyromegaly, no carotid bruits Lungs: no use of accessory muscles, no dullness to percussion, decreased without rales or rhonchi  Cardiovascular: Rhythm regular, heart sounds  normal, no murmurs or gallops, tr  peripheral edema Musculoskeletal: No deformities, no cyanosis or clubbing , no tremors       Assessment & Plan:

## 2014-05-16 NOTE — Assessment & Plan Note (Signed)
Compensated on CPAP  Cont to work on wt loss.

## 2014-05-18 NOTE — Progress Notes (Signed)
Reviewed & agree with plan  

## 2014-06-20 ENCOUNTER — Other Ambulatory Visit: Payer: Self-pay | Admitting: Adult Health

## 2014-08-29 ENCOUNTER — Encounter: Payer: 59 | Attending: "Endocrinology | Admitting: Nutrition

## 2014-08-29 VITALS — Ht 75.0 in | Wt 354.5 lb

## 2014-08-29 DIAGNOSIS — Z794 Long term (current) use of insulin: Secondary | ICD-10-CM | POA: Insufficient documentation

## 2014-08-29 DIAGNOSIS — E1165 Type 2 diabetes mellitus with hyperglycemia: Secondary | ICD-10-CM

## 2014-08-29 DIAGNOSIS — E118 Type 2 diabetes mellitus with unspecified complications: Secondary | ICD-10-CM | POA: Insufficient documentation

## 2014-08-29 DIAGNOSIS — Z6841 Body Mass Index (BMI) 40.0 and over, adult: Secondary | ICD-10-CM | POA: Insufficient documentation

## 2014-08-29 DIAGNOSIS — Z713 Dietary counseling and surveillance: Secondary | ICD-10-CM | POA: Insufficient documentation

## 2014-08-29 DIAGNOSIS — IMO0002 Reserved for concepts with insufficient information to code with codable children: Secondary | ICD-10-CM

## 2014-08-29 NOTE — Progress Notes (Signed)
  Medical Nutrition Therapy:  Appt start time: 1330 end time:  1430.   Assessment:  Primary concerns today: Diabetes. Lives with his wife. Most weight he weighed was 370 lbs. He and his wife does his own shopping and cooking. Foods are baked broiled fried foods. Eats 2-3 meals per day. 100 units of Levemir once a day and 20 units of Novolog with meals. He is on Invokana 100 mg per day and Metformin 500 mg BID Recently starting taking his meter and insulin pen with him for meals outside of the home. Limited physical activity due to breathing issues. HIstory of Sleep Apnea. Most recent A1C was 9.9% in October 2015. He is considering Bariatric surgery for needed weight loss. 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 Willing to make changes with diet to help improve blood sugars.  Preferred Learning Style:    Auditory  Visual  Hands on  No preference indicated   Learning Readiness:  Not ready  Contemplating  Ready  Change in progress   MEDICATIONS:  See ;ost   DIETARY INTAKE:  24-hr recall:  B ( AM): Skips breakfast usually: OR [place], english muffins with butter OR eggs, bacon, hash browns, water Sometimes has a regular egg Snk ( AM): none  L ( PM): [place], cheese on ww bread with mayo with Cheetos 1 oz, water Snk ( PM): none D ( PM):  2 Bacon, tomato with mayo on ww bread, water  Snk ( PM): Debbie swiss roll- 1 (2pk) Beverages: Water, Dr. Malachi Bonds   Usual physical activity: none really  Estimated energy needs: 2000 calories 225 g carbohydrates 150 g protein 56 g fat  Progress Towards Goal(s):  In progress.   Nutritional Diagnosis:  NB-1.1 Food and nutrition-related knowledge deficit As related to Diabetes.  As evidenced by A1C of 9.9%    Intervention:  Nutrition counseling for diabetes and weight management. Plan:  Aim for 4-5 Carb Choices per meal (45-60 grams) +/- 1 either way  Avoid snacks between meals. Increase water intake to 84 oz per  day. Eat three meals per day and do not skip meals. Include protein in moderation with your meals  Consider  increasing your activity level by 15 for 30 minutes daily as tolerated Test BG at before meals and at bedtime per MD. Consider also occasionally 2 hours after largest meal of the day. Be sure to take medication of insulins, invokana and metformi as directed by MD Goals: 1. Get on schedule with meals 2. Follow the Plate Method and counting carbs 3. Cut out night light snacks-especially Little Debbie Cakes. 4. Get A1C to 8% in three months. 5. Lose 1 lb per week.  Teaching Method Utilized:  Visual Auditory Hands on  Handouts given during visit include: The Plate Method Carb Counting and Food Label handouts Meal Plan Card  Barriers to learning/adherence to lifestyle change: vision  Demonstrated degree of understanding via:  Teach Back   Monitoring/Evaluation:  Dietary intake, exercise, meal planning, and body weight in 1 month(s).

## 2014-09-03 ENCOUNTER — Ambulatory Visit (INDEPENDENT_AMBULATORY_CARE_PROVIDER_SITE_OTHER): Payer: 59 | Admitting: Pulmonary Disease

## 2014-09-03 ENCOUNTER — Encounter: Payer: Self-pay | Admitting: Pulmonary Disease

## 2014-09-03 VITALS — BP 136/76 | HR 75 | Ht 76.0 in | Wt 355.2 lb

## 2014-09-03 DIAGNOSIS — G4733 Obstructive sleep apnea (adult) (pediatric): Secondary | ICD-10-CM

## 2014-09-03 DIAGNOSIS — Z23 Encounter for immunization: Secondary | ICD-10-CM

## 2014-09-03 NOTE — Patient Instructions (Signed)
Flu shot Call as needed

## 2014-09-03 NOTE — Patient Instructions (Signed)
Plan:  Aim for 4-5 Carb Choices per meal (45-60 grams) +/- 1 either way  Avoid snacks between meals. Increase water intake to 84 oz per day. Eat three meals per day and do not skip meals. Include protein in moderation with your meals  Consider  increasing your activity level by 15 for 30 minutes daily as tolerated Test BG at before meals and at bedtime per MD. Consider also occasionally 2 hours after largest meal of the day. Be sure to take medication of insulins, invokana and metformi as directed by MD Goals: 1. Get on schedule with meals 2. Follow the Plate Method and counting carbs 3. Cut out night light snacks-especially Little Debbie Cakes. 4. Get A1C to 8% in three months. 5. Lose 1 lb per week.

## 2014-09-03 NOTE — Addendum Note (Signed)
Addended by: Mathis Dad on: 09/03/2014 04:27 PM   Modules accepted: Orders

## 2014-09-03 NOTE — Assessment & Plan Note (Signed)
Stable, Weight loss encouraged, compliance with goal of at least 4-6 hrs every night is the expectation. Advised against medications with sedative side effects Cautioned against driving when sleepy - understanding that sleepiness will vary on a day to day basis

## 2014-09-03 NOTE — Progress Notes (Signed)
   Subjective:    Patient ID: Kenneth Patrick, male    DOB: 12/04/1950, 63 y.o.   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  Initial OV Nov 4 '11  told 'asthma' many years ago, has used inhalers & diskus c/o wheezing on perfumes, cooking  He smoked > 60 Pyrs before quitting in 2006.   Significant tests/ events  Echo in june '11 showed nml LV fn, small pericardial effusion, grossly nml valves. Stress test reportedly nml.  Spirometry showed severe airway obstruction with a low ratio & FEV1 of 25 %, good effort !   Meds -12/2012 -Added spiriva - self dc'd -Does not feel he needs it, thought it caused leg swelling. 06/2013 -Breo in place of Symbicort     Admitted February 20-February 26- acute hypoxic respiratory failure and AKI/metabolic acidosis in the setting of RSV bronchitis.  CT chest showed bilateral lower lobe bronchiectasis.  2-D echo showed grade 1 diastolic dysfunction with preserved EF.  Had a low titer c-ANCA , Positive  Seen by ID , HIV neg.  Histoplasma ag positive in urine , but serology neg . (lived in [place] 10 yrs ago)   09/03/2014  Chief Complaint  Patient presents with  . Follow-up    f/u copd; no complaints   Feels well, compliant with breo - stays in the house, does not even like driving to [place] Wearing CPAP At bedtime , no missed night.  Wt has trended up , discussed wt loss.  Gets CPAP supplies from online website  On [place] Denies chest pain, orthopnea, edema or fever.   Review of Systems neg for any significant sore throat, dysphagia, itching, sneezing, nasal congestion or excess/ purulent secretions, fever, chills, sweats, unintended wt loss, pleuritic or exertional cp, hempoptysis, orthopnea pnd or change in chronic leg swelling. Also denies presyncope, palpitations, heartburn, abdominal pain, nausea, vomiting, diarrhea or change in bowel or urinary habits,  dysuria,hematuria, rash, arthralgias, visual complaints, headache, numbness weakness or ataxia.     Objective:   Physical Exam  Gen. Pleasant, well-nourished, in no distress ENT - no lesions, no post nasal drip Neck: No JVD, no thyromegaly, no carotid bruits Lungs: no use of accessory muscles, no dullness to percussion, clear without rales or rhonchi  Cardiovascular: Rhythm regular, heart sounds  normal, no murmurs or gallops, no peripheral edema Musculoskeletal: No deformities, no cyanosis or clubbing        Assessment & Plan:

## 2014-09-03 NOTE — Assessment & Plan Note (Signed)
Stable Ct breo

## 2014-09-12 ENCOUNTER — Telehealth: Payer: Self-pay | Admitting: Pulmonary Disease

## 2014-09-12 MED ORDER — FLUTICASONE FUROATE-VILANTEROL 100-25 MCG/INH IN AEPB: INHALATION_SPRAY | RESPIRATORY_TRACT | Status: DC

## 2014-09-12 NOTE — Telephone Encounter (Signed)
Pt aware that samples left at front desk for pick up.

## 2014-10-10 ENCOUNTER — Ambulatory Visit: Payer: 59 | Admitting: Nutrition

## 2015-03-05 ENCOUNTER — Encounter: Payer: Self-pay | Admitting: Adult Health

## 2015-03-05 ENCOUNTER — Ambulatory Visit (INDEPENDENT_AMBULATORY_CARE_PROVIDER_SITE_OTHER): Payer: 59 | Admitting: Adult Health

## 2015-03-05 VITALS — BP 130/70 | HR 86 | Temp 98.1°F | Ht 76.0 in | Wt 347.0 lb

## 2015-03-05 DIAGNOSIS — Z23 Encounter for immunization: Secondary | ICD-10-CM

## 2015-03-05 DIAGNOSIS — J449 Chronic obstructive pulmonary disease, unspecified: Secondary | ICD-10-CM

## 2015-03-05 DIAGNOSIS — G4733 Obstructive sleep apnea (adult) (pediatric): Secondary | ICD-10-CM | POA: Diagnosis not present

## 2015-03-05 NOTE — Assessment & Plan Note (Addendum)
COPD, compensated on present regimen  Plan    Continue on  BREO 1 puff daily  Prevnar vaccine today .  follow up Dr. Elsworth Soho  In 6 months  Please contact office for sooner follow up if symptoms do not improve or worsen or seek emergency care

## 2015-03-05 NOTE — Patient Instructions (Addendum)
Continue on  CPAP At bedtime   Work on Weight loss as discussed.  Continue on  BREO 1 puff daily  Prevnar vaccine today .  follow up Dr. Elsworth Soho  In 6 months   Please contact office for sooner follow up if symptoms do not improve or worsen or seek emergency care

## 2015-03-05 NOTE — Assessment & Plan Note (Signed)
Compensated  Plan  Continue on  CPAP At bedtime   Work on Weight loss as discussed.   follow up Dr. Elsworth Soho  In 6 months   Please contact office for sooner follow up if symptoms do not improve or worsen or seek emergency care

## 2015-03-05 NOTE — Progress Notes (Signed)
   Subjective:    Patient ID: Kenneth Patrick, male    DOB: 12/21/1950, 64 y.o.   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  Initial OV Nov 4 '11  told 'asthma' many years ago, has used inhalers & diskus c/o wheezing on perfumes, cooking  He smoked > 60 Pyrs before quitting in 2006.   Significant tests/ events  Echo in june '11 showed nml LV fn, small pericardial effusion, grossly nml valves. Stress test reportedly nml.  Spirometry showed severe airway obstruction with a low ratio & FEV1 of 25 %, good effort !   Meds -12/2012 -Added spiriva - self dc'd -Does not feel he needs it, thought it caused leg swelling. 06/2013 -Breo in place of Symbicort     Admitted February 20-February 26- acute hypoxic respiratory failure and AKI/metabolic acidosis in the setting of RSV bronchitis.  CT chest showed bilateral lower lobe bronchiectasis.  2-D echo showed grade 1 diastolic dysfunction with preserved EF.  Had a low titer c-ANCA , Positive  Seen by ID , HIV neg.  Histoplasma ag positive in urine , but serology neg . (lived in [place] 10 yrs ago)   09/03/2014  Chief Complaint  Patient presents with  . Follow-up    f/u copd; no complaints   Feels well, compliant with breo - stays in the house, does not even like driving to [place] Wearing CPAP At bedtime , no missed night.  Wt has trended up , discussed wt loss.  Gets CPAP supplies from online website  On [place] Denies chest pain, orthopnea, edema or fever.    03/05/2015 Follow up : COPD and OSA  Patient returns for six-month follow-up  He says overall he has been doing well with his breathing. He remains on BREO .  He denies any flare of cough or wheezing. No emergency room visits for his breathing. No increased rescue inhaler use.  Patient has underlying sleep apnea is on C Pap at nighttime. Says he wears his C Pap machine every night for at least  6-8 hours. Says he has old machine and does not have a download chip. He denies any daytime sleepiness.  Patient denies any chest pain, orthopnea, PND, or increased leg swelling.    Review of Systems neg for any significant sore throat, dysphagia, itching, sneezing, nasal congestion or excess/ purulent secretions, fever, chills, sweats, unintended wt loss, pleuritic or exertional cp, hempoptysis, orthopnea pnd or change in chronic leg swelling. Also denies presyncope, palpitations, heartburn, abdominal pain, nausea, vomiting, diarrhea or change in bowel or urinary habits, dysuria,hematuria, rash, arthralgias, visual complaints, headache, numbness weakness or ataxia.     Objective:   Physical Exam  Gen. Pleasant, well-nourished, in no distress, obese  ENT - no lesions, no post nasal drip Neck: No JVD, no thyromegaly, no carotid bruits Lungs: no use of accessory muscles, no dullness to percussion, clear without rales or rhonchi  Cardiovascular: Rhythm regular, heart sounds  normal, no murmurs or gallops, no peripheral edema Musculoskeletal: No deformities, no cyanosis or clubbing        Assessment & Plan:

## 2015-03-07 NOTE — Progress Notes (Signed)
Reviewed & agree with plan  

## 2015-03-11 LAB — HEMOGLOBIN A1C: Hemoglobin A1C: 7.9

## 2015-05-07 ENCOUNTER — Other Ambulatory Visit: Payer: Self-pay | Admitting: Adult Health

## 2015-06-17 LAB — HEMOGLOBIN A1C: Hemoglobin A1C: 7.5

## 2015-06-18 ENCOUNTER — Other Ambulatory Visit: Payer: Self-pay | Admitting: *Deleted

## 2015-06-18 MED ORDER — FLUTICASONE FUROATE-VILANTEROL 100-25 MCG/INH IN AEPB
INHALATION_SPRAY | RESPIRATORY_TRACT | Status: DC
Start: 2015-06-18 — End: 2015-09-26

## 2015-06-18 NOTE — Telephone Encounter (Signed)
Rx sent to [facility] for Breo Nothing further needed.

## 2015-07-31 ENCOUNTER — Ambulatory Visit (INDEPENDENT_AMBULATORY_CARE_PROVIDER_SITE_OTHER): Payer: 59 | Admitting: Pulmonary Disease

## 2015-07-31 ENCOUNTER — Encounter: Payer: Self-pay | Admitting: Pulmonary Disease

## 2015-07-31 VITALS — BP 132/72 | HR 80 | Ht 76.0 in | Wt 347.0 lb

## 2015-07-31 DIAGNOSIS — J449 Chronic obstructive pulmonary disease, unspecified: Secondary | ICD-10-CM

## 2015-07-31 DIAGNOSIS — Z23 Encounter for immunization: Secondary | ICD-10-CM | POA: Diagnosis not present

## 2015-07-31 DIAGNOSIS — G4733 Obstructive sleep apnea (adult) (pediatric): Secondary | ICD-10-CM | POA: Diagnosis not present

## 2015-07-31 NOTE — Patient Instructions (Signed)
Flu shot stay on Breo CPAP supplies will be renewed

## 2015-07-31 NOTE — Assessment & Plan Note (Signed)
Flu shot stay on Breo

## 2015-07-31 NOTE — Progress Notes (Signed)
   Subjective:    Patient ID: Kenneth Patrick, male    DOB: 1951/05/19, 64 y.o.   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  Told 'asthma' many years ago, has used inhalers & diskus c/o wheezing on perfumes, cooking  He smoked > 60 Pyrs before quitting in 2006.    07/31/2015  Chief Complaint  Patient presents with  . Follow-up    Pt states that his breathing is doing well. He longer uses O2. Pt states that he does use his CPAP nightly and does feel it helps.    Feels well, compliant with breo - this has helped him, does not need albuterol much at all Wearing CPAP At bedtime , no missed night.  Wt has trended up , mask okay, pressure okay Gets CPAP supplies from online website  On [place] Denies chest pain, orthopnea, edema or fever.   Significant tests/ events  Spirometry 2011-  severe airway obstruction with a low ratio & FEV1 of 25 %, good effort !   Meds -12/2012 -Added spiriva - self dc'd -Does not feel he needs it, thought it caused leg swelling. 06/2013 -Breo in place of Symbicort   Admitted Feb 2015 -for acute hypoxic respiratory failure and AKI/metabolic acidosis in the setting of RSV bronchitis.  CT chest showed bilateral lower lobe bronchiectasis.  2-D echo showed grade 1 diastolic dysfunction with preserved EF.  Had a low titer c-ANCA , Positive  Seen by ID , HIV neg.  Histoplasma ag positive in urine , but serology neg . (lived in [place] 10 yrs ago)   Review of Systems neg for any significant sore throat, dysphagia, itching, sneezing, nasal congestion or excess/ purulent secretions, fever, chills, sweats, unintended wt loss, pleuritic or exertional cp, hempoptysis, orthopnea pnd or change in chronic leg swelling. Also denies presyncope, palpitations, heartburn, abdominal pain, nausea, vomiting, diarrhea or change in bowel or urinary habits, dysuria,hematuria, rash, arthralgias,  visual complaints, headache, numbness weakness or ataxia.     Objective:   Physical Exam  Gen. Pleasant, obese, in no distress ENT - no lesions, no post nasal drip Neck: No JVD, no thyromegaly, no carotid bruits Lungs: no use of accessory muscles, no dullness to percussion, decreased without rales or rhonchi  Cardiovascular: Rhythm regular, heart sounds  normal, no murmurs or gallops, no peripheral edema Musculoskeletal: No deformities, no cyanosis or clubbing , no tremors       Assessment & Plan:

## 2015-07-31 NOTE — Assessment & Plan Note (Addendum)
CPAP supplies will be renewed His weight continues to be a problem  Weight loss encouraged, compliance with goal of at least 4-6 hrs every night is the expectation. Advised against medications with sedative side effects Cautioned against driving when sleepy - understanding that sleepiness will vary on a day to day basis

## 2015-08-11 ENCOUNTER — Other Ambulatory Visit: Payer: Self-pay | Admitting: "Endocrinology

## 2015-09-09 LAB — HEMOGLOBIN A1C: HEMOGLOBIN A1C: 8.2

## 2015-09-26 ENCOUNTER — Ambulatory Visit (INDEPENDENT_AMBULATORY_CARE_PROVIDER_SITE_OTHER): Payer: 59 | Admitting: "Endocrinology

## 2015-09-26 ENCOUNTER — Encounter: Payer: Self-pay | Admitting: "Endocrinology

## 2015-09-26 VITALS — BP 127/74 | HR 79 | Ht 75.0 in | Wt 358.0 lb

## 2015-09-26 DIAGNOSIS — E118 Type 2 diabetes mellitus with unspecified complications: Secondary | ICD-10-CM

## 2015-09-26 DIAGNOSIS — E1165 Type 2 diabetes mellitus with hyperglycemia: Secondary | ICD-10-CM

## 2015-09-26 DIAGNOSIS — IMO0002 Reserved for concepts with insufficient information to code with codable children: Secondary | ICD-10-CM

## 2015-09-26 DIAGNOSIS — Z794 Long term (current) use of insulin: Secondary | ICD-10-CM | POA: Diagnosis not present

## 2015-09-26 DIAGNOSIS — E785 Hyperlipidemia, unspecified: Secondary | ICD-10-CM | POA: Diagnosis not present

## 2015-09-26 DIAGNOSIS — I10 Essential (primary) hypertension: Secondary | ICD-10-CM | POA: Diagnosis not present

## 2015-09-26 MED ORDER — INSULIN LISPRO 100 UNIT/ML (KWIKPEN): 20.0000 [IU] | PEN_INJECTOR | Freq: Three times a day (TID) | SUBCUTANEOUS | Status: DC

## 2015-09-26 NOTE — Patient Instructions (Signed)

## 2015-09-28 NOTE — Progress Notes (Signed)
Subjective:    Patient ID: Kenneth Patrick, male    DOB: 06/27/1951, PCP [name]., MD   Past Medical History  Diagnosis Date  . Diabetic neuropathy ([place])   . Respiratory infection   . Low back pain   . Asthma   . Dyspnea   . Sleep apnea   . Depression   . DM2 (diabetes mellitus, type 2) ([place])   . Hypercholesterolemia    Past Surgical History  Procedure Laterality Date  . Cholecystectomy    . Left eye enucleation following trauma     Social History   Social History  . Marital Status: Married    Spouse Name: N/A  . Number of Children: N/A  . Years of Education: N/A   Occupational History  . retired Engineer, structural    Social History Main Topics  . Smoking status: Former Smoker -- 1.50 packs/day for 20 years    Quit date: 10/05/2004  . Smokeless tobacco: Never Used  . Alcohol Use: No  . Drug Use: No     Comment: stopped smoking marijuana in 2006  . Sexual Activity: Not Asked   Other Topics Concern  . None   Social History Narrative   Outpatient Encounter Prescriptions as of 09/26/2015  Medication Sig  . albuterol (VENTOLIN HFA) 108 (90 BASE) MCG/ACT inhaler Inhale 2 puffs into the lungs every 6 (six) hours as needed.    [name] atorvastatin (LIPITOR) 80 MG tablet Take 80 mg by mouth At bedtime.   . furosemide (LASIX) 40 MG tablet Take 40 mg by mouth daily as needed for fluid.   [name] gabapentin (NEURONTIN) 300 MG capsule Take 300 mg by mouth 2 (two) times daily.   . insulin lispro (HUMALOG KWIKPEN) 100 UNIT/ML KiwkPen Inject 0.2-0.26 mLs (20-26 Units total) into the skin 3 (three) times daily with meals.  [name] LEVEMIR 100 UNIT/ML injection Inject 90 Units into the skin at bedtime.   . metFORMIN (GLUCOPHAGE) 500 MG tablet Take 1 tablet by mouth 2 (two) times daily.  [name] oxymetazoline (AFRIN) 0.05 % nasal spray Place 1-2 sprays into the nose 2 (two) times daily as needed.   . sertraline (ZOLOFT) 100 MG tablet Take 200 mg by mouth daily.   [name] VICTOZA 18 MG/3ML SOLN Inject 1.8  Units into the skin daily.   . [DISCONTINUED] HUMALOG KWIKPEN 100 UNIT/ML KiwkPen Inject subcutaneously 20  units 3 times daily  . [DISCONTINUED] Fluticasone Furoate-Vilanterol (BREO ELLIPTA) 100-25 MCG/INH AEPB Inhale 1 puff into lungs  daily (product expires 42  days after opening)  . [DISCONTINUED] INVOKANA 300 MG TABS Take 300 mg by mouth daily.    No facility-administered encounter medications on file as of 09/26/2015.   ALLERGIES: Allergies  Allergen Reactions  . Penicillins     Childhood allergy    VACCINATION STATUS: Immunization History  Administered Date(s) Administered  . Influenza Split 10/23/2011  . Influenza Whole 09/04/2010  . Influenza,inj,Quad PF,36+ Mos 06/08/2013, 09/03/2014, 07/31/2015  . Pneumococcal Conjugate-13 03/05/2015  . Pneumococcal Polysaccharide-23 05/14/2011, 11/27/2013    Diabetes He presents for his follow-up diabetic visit. He has type 2 diabetes mellitus. Onset time: He was diagnosed at approximate age of 62 years. His disease course has been stable. There are no hypoglycemic associated symptoms. Pertinent negatives for hypoglycemia include no confusion, headaches, pallor or seizures. There are no diabetic associated symptoms. Pertinent negatives for diabetes include no chest pain, no fatigue, no polydipsia, no polyphagia, no polyuria and no weakness. There are no hypoglycemic complications.  Symptoms are stable. There are no diabetic complications. Risk factors for coronary artery disease include diabetes mellitus, dyslipidemia, hypertension, male sex, obesity, sedentary lifestyle and tobacco exposure. Current diabetic treatment includes insulin injections. He is compliant with treatment some of the time. His weight is stable. He is following a generally unhealthy diet. When asked about meal planning, he reported none. He has had a previous visit with a dietitian. He never participates in exercise. His home blood glucose trend is increasing steadily. His  breakfast blood glucose range is generally 140-180 mg/dl. His dinner blood glucose range is generally 180-200 mg/dl. His overall blood glucose range is 180-200 mg/dl. Eye exam is current.  Hyperlipidemia This is a chronic problem. The current episode started more than 1 year ago. Pertinent negatives include no chest pain, myalgias or shortness of breath. Current antihyperlipidemic treatment includes statins. Compliance problems include adherence to diet and adherence to exercise.  Risk factors for coronary artery disease include dyslipidemia, diabetes mellitus, obesity, male sex and a sedentary lifestyle.  Hypertension This is a chronic problem. The current episode started more than 1 year ago. Pertinent negatives include no chest pain, headaches, neck pain, palpitations or shortness of breath. Risk factors for coronary artery disease include diabetes mellitus, dyslipidemia, male gender, obesity, smoking/tobacco exposure and sedentary lifestyle. Compliance problems include diet and exercise.      Review of Systems  Constitutional: Negative for fever, chills, fatigue and unexpected weight change.  HENT: Negative for dental problem, mouth sores and trouble swallowing.   Eyes: Negative for visual disturbance.  Respiratory: Negative for cough, choking, chest tightness, shortness of breath and wheezing.   Cardiovascular: Negative for chest pain, palpitations and leg swelling.  Gastrointestinal: Negative for nausea, vomiting, abdominal pain, diarrhea, constipation and abdominal distention.  Endocrine: Negative for polydipsia, polyphagia and polyuria.  Genitourinary: Negative for dysuria, urgency, hematuria and flank pain.  Musculoskeletal: Negative for myalgias, back pain, gait problem and neck pain.  Skin: Negative for pallor, rash and wound.  Neurological: Negative for seizures, syncope, weakness, numbness and headaches.  Psychiatric/Behavioral: Negative.  Negative for confusion and dysphoric mood.     Objective:    BP 127/74 mmHg  Pulse 79  Ht '6\' 3"'$  (1.905 m)  Wt 358 lb (162.388 kg)  BMI 44.75 kg/m2  SpO2 95%  Wt Readings from Last 3 Encounters:  09/26/15 358 lb (162.388 kg)  07/31/15 347 lb (157.398 kg)  03/05/15 347 lb (157.398 kg)    Physical Exam  Constitutional: He is oriented to person, place, and time. He appears well-developed and well-nourished. He is cooperative. No distress.  HENT:  Head: Normocephalic and atraumatic.  Neck: Normal range of motion. Neck supple. No tracheal deviation present. No thyromegaly present.  Cardiovascular: Normal rate, S1 normal, S2 normal and normal heart sounds.  Exam reveals no gallop.   No murmur heard. Pulses:      Dorsalis pedis pulses are 1+ on the right side, and 1+ on the left side.       Posterior tibial pulses are 1+ on the right side, and 1+ on the left side.  Pulmonary/Chest: Breath sounds normal. No respiratory distress. He has no wheezes.  Abdominal: Soft. Bowel sounds are normal. He exhibits no distension. There is no tenderness. There is no guarding and no CVA tenderness.  Musculoskeletal: He exhibits no edema.       Right shoulder: He exhibits no swelling and no deformity.  Neurological: He is alert and oriented to person, place, and time. He has normal  strength and normal reflexes. No cranial nerve deficit or sensory deficit. Gait normal.  Skin: Skin is warm and dry. No rash noted. No cyanosis. Nails show no clubbing.  Psychiatric: He has a normal mood and affect. His speech is normal and behavior is normal. Judgment and thought content normal. Cognition and memory are normal.       Diabetic Labs (most recent): Lab Results  Component Value Date   HGBA1C 8.2 09/09/2015   HGBA1C 7.5 06/17/2015   HGBA1C 7.9 03/11/2015     Assessment & Plan:   1. Uncontrolled type 2 diabetes mellitus with complication, with long-term current use of insulin ([place])  - his diabetes is  complicated by morbid obesity and sleep  apnea and patient remains at a high risk for more acute and chronic complications of diabetes which include CAD, CVA, CKD, retinopathy, and neuropathy. These are all discussed in detail with the patient.  Patient came with above target glucose profile, and  recent A1c of 8.2 %.  Glucose logs and insulin administration records pertaining to this visit,  to be scanned into patient's records.  Recent labs reviewed.   - I have re-counseled the patient on diet management and weight loss  by adopting a carbohydrate restricted / protein rich  Diet.  - Suggestion is made for patient to avoid simple carbohydrates   from their diet including Cakes , Desserts, Ice Cream,  Soda (  diet and regular) , Sweet Tea , Candies,  Chips, Cookies, Artificial Sweeteners,   and "Sugar-free" Products .  This will help patient to have stable blood glucose profile and potentially avoid unintended  Weight gain.  - Patient is advised to stick to a routine mealtimes to eat 3 meals  a day and avoid unnecessary snacks ( to snack only to correct hypoglycemia).  - The patient  has been  scheduled with Jearld Fenton, RDN, CDE for individualized DM education.  - I have approached patient with the following individualized plan to manage diabetes and patient agrees.  - Continue basal insulin Levemir  90 units qhs, and increase  Humalog to 20 units TIDAC DAC for pre-meal BG readings of 90-'150mg'$ /dl, plus patient specific correction dose of rapid acting insulin for unexpected hyperglycemia above '150mg'$ /dl, associated with strict monitoring of BG AC and HS.  -Adjustment parameters for hypo and hyperglycemia were given in a written document to patient. -Patient is encouraged to call clinic for blood glucose levels less than 70 or above 300 mg /dl. -He will continue on Victoza 1.8 mg subcutaneous daily . -I will continue metformin '500mg'$  po BID, did not tolerate Invokana.   - Patient specific target  for A1c; LDL, HDL, Triglycerides, and   Waist Circumference were discussed in detail.  2) BP/HTN : controlled. Continue current medications. 3) Lipids/HPL:  continue statins. 4)  Weight/Diet: CDE consult in progress, exercise, and carbohydrates information provided.  5) Chronic Care/Health Maintenance:  -Patient is on ACEI/ARB and Statin medications and encouraged to continue to follow up with Ophthalmology, Podiatrist at least yearly or according to recommendations, and advised to  stay away from smoking. I have recommended yearly flu vaccine and pneumonia vaccination at least every 5 years; moderate intensity exercise for up to 150 minutes weekly; and  sleep for at least 7 hours a day.  - 25 minutes of time was spent on the care of this patient , 50% of which was applied for counseling on diabetes complications and their preventions.  - I advised patient to maintain  close follow up with VYAS,DHRUV B., MD for primary care needs.  Patient is asked to bring meter and  blood glucose logs during their next visit.   Follow up plan: -Return in about 3 months (around 12/25/2015) for diabetes, high blood pressure, high cholesterol, follow up with pre-visit labs, meter, and logs.  Glade Lloyd, MD Phone: [phone]  Fax: [phone]   09/28/2015, 9:24 AM

## 2015-12-02 ENCOUNTER — Ambulatory Visit (INDEPENDENT_AMBULATORY_CARE_PROVIDER_SITE_OTHER): Payer: 59 | Admitting: Adult Health

## 2015-12-02 ENCOUNTER — Encounter: Payer: Self-pay | Admitting: Adult Health

## 2015-12-02 VITALS — BP 132/74 | HR 87 | Temp 98.7°F | Ht 76.0 in | Wt 254.0 lb

## 2015-12-02 DIAGNOSIS — J449 Chronic obstructive pulmonary disease, unspecified: Secondary | ICD-10-CM

## 2015-12-02 DIAGNOSIS — G4733 Obstructive sleep apnea (adult) (pediatric): Secondary | ICD-10-CM | POA: Diagnosis not present

## 2015-12-02 NOTE — Assessment & Plan Note (Signed)
Controlled well on BREO   Plan  Continue on  BREO 1 puff daily  Follow up Dr. Elsworth Soho  In 6 months   Please contact office for sooner follow up if symptoms do not improve or worsen or seek emergency care

## 2015-12-02 NOTE — Progress Notes (Signed)
Subjective:    Patient ID: Kenneth Patrick, male    DOB: [birth year], 65 y.o.   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  Told 'asthma' many years ago, has used inhalers & diskus c/o wheezing on perfumes, cooking  He smoked > 60 Pyrs before quitting in 2006.  Gets CPAP supplies from online website  On [place] for DM    Significant tests/ events  Spirometry 2011-  severe airway obstruction with a low ratio & FEV1 of 25 %, good effort !   Meds -12/2012 -Added spiriva - self dc'd -Does not feel he needs it, thought it caused leg swelling. 06/2013 -Breo in place of Symbicort   Admitted Feb 2015 -for acute hypoxic respiratory failure and AKI/metabolic acidosis in the setting of RSV bronchitis.  CT chest showed bilateral lower lobe bronchiectasis.  2-D echo showed grade 1 diastolic dysfunction with preserved EF.  Had a low titer c-ANCA , Positive  Seen by ID , HIV neg.  Histoplasma ag positive in urine , but serology neg . (lived in [place] 10 yrs ago)   12/02/2015 Follow up : OSA and COPD  Pt returns for 4 month follow up  Says he is doing well on CPAP .  Uses CPAP on average 8-10 hours nightly, mask fits fine.  Pt has no new complaints at this time.   Breathing is doing okay with no flare of cough or wheezing  Remains on BREO daily  Denies chest pain, orthopnea, or edema Flu , PVX , and Prevnar utd. .   Past Medical History  Diagnosis Date  . Diabetic neuropathy ([place])   . Respiratory infection   . Low back pain   . Asthma   . Dyspnea   . Sleep apnea   . Depression   . DM2 (diabetes mellitus, type 2) ([place])   . Hypercholesterolemia    Current Outpatient Prescriptions on File Prior to Visit  Medication Sig Dispense Refill  . albuterol (VENTOLIN HFA) 108 (90 BASE) MCG/ACT inhaler Inhale 2 puffs into the lungs every 6 (six) hours as needed.      [name] atorvastatin (LIPITOR) 80 MG tablet Take 80  mg by mouth At bedtime.     . furosemide (LASIX) 40 MG tablet Take 40 mg by mouth daily as needed for fluid.     [name] gabapentin (NEURONTIN) 300 MG capsule Take 300 mg by mouth 2 (two) times daily.     . insulin lispro (HUMALOG KWIKPEN) 100 UNIT/ML KiwkPen Inject 0.2-0.26 mLs (20-26 Units total) into the skin 3 (three) times daily with meals. 60 mL 2  . LEVEMIR 100 UNIT/ML injection Inject 90 Units into the skin at bedtime.     . metFORMIN (GLUCOPHAGE) 500 MG tablet Take 1 tablet by mouth 2 (two) times daily.    [name] oxymetazoline (AFRIN) 0.05 % nasal spray Place 1-2 sprays into the nose 2 (two) times daily as needed.     . sertraline (ZOLOFT) 100 MG tablet Take 200 mg by mouth daily.     [name] VICTOZA 18 MG/3ML SOLN Inject 1.8 Units into the skin daily.      No current facility-administered medications on file prior to visit.      Review of Systems neg for any significant sore throat, dysphagia, itching, sneezing, nasal congestion or excess/ purulent secretions, fever, chills, sweats, unintended wt loss, pleuritic or exertional cp, hempoptysis,  orthopnea pnd or change in chronic leg swelling. Also denies presyncope, palpitations, heartburn, abdominal pain, nausea, vomiting, diarrhea or change in bowel or urinary habits, dysuria,hematuria, rash, arthralgias, visual complaints, headache, numbness weakness or ataxia.     Objective:   Physical Exam Filed Vitals:   12/02/15 1436  BP: 132/74  Pulse: 87  Temp: 98.7 F (37.1 C)  TempSrc: Oral  Height: '6\' 4"'$  (1.93 m)  Weight: 254 lb (115.214 kg)  SpO2: 95%   Body mass index is 30.93 kg/(m^2).   Gen. Pleasant, obese, in no distress ENT - no lesions, no post nasal drip., class 2-3 MP airway  Neck: No JVD, no thyromegaly, no carotid bruits Lungs: no use of accessory muscles, no dullness to percussion, decreased without rales or rhonchi  Cardiovascular: Rhythm regular, heart sounds  normal, no murmurs or gallops, no peripheral  edema Musculoskeletal: No deformities, no cyanosis or clubbing , no tremors  Tammy Parrett NP-C  [facility]  12/02/2015       Assessment & Plan:

## 2015-12-02 NOTE — Patient Instructions (Signed)
Continue on  CPAP At bedtime   Work on Weight loss as discussed.  Continue on  BREO 1 puff daily  Follow up Dr. Elsworth Soho  In 6 months   Please contact office for sooner follow up if symptoms do not improve or worsen or seek emergency care

## 2015-12-02 NOTE — Assessment & Plan Note (Signed)
Doing well on CPAP  plan Continue on  CPAP At bedtime   Work on Weight loss as discussed.  Follow up Dr. Elsworth Soho  In 6 months   Please contact office for sooner follow up if symptoms do not improve or worsen or seek emergency care

## 2015-12-04 NOTE — Progress Notes (Signed)
Reviewed & agree with plan  

## 2015-12-18 ENCOUNTER — Other Ambulatory Visit: Payer: Self-pay | Admitting: "Endocrinology

## 2015-12-18 LAB — BASIC METABOLIC PANEL
BUN: 21 mg/dL (ref 7–25)
CALCIUM: 9.3 mg/dL (ref 8.6–10.3)
CO2: 24 mmol/L (ref 20–31)
CREATININE: 1.24 mg/dL (ref 0.70–1.25)
Chloride: 104 mmol/L (ref 98–110)
Glucose, Bld: 128 mg/dL — ABNORMAL HIGH (ref 65–99)
Potassium: 4.5 mmol/L (ref 3.5–5.3)
Sodium: 140 mmol/L (ref 135–146)

## 2015-12-19 LAB — HEMOGLOBIN A1C
HEMOGLOBIN A1C: 8.2 % — AB (ref <5.7)
MEAN PLASMA GLUCOSE: 189 mg/dL — AB (ref <117)

## 2015-12-30 ENCOUNTER — Encounter: Payer: Self-pay | Admitting: "Endocrinology

## 2015-12-30 ENCOUNTER — Ambulatory Visit (INDEPENDENT_AMBULATORY_CARE_PROVIDER_SITE_OTHER): Payer: 59 | Admitting: "Endocrinology

## 2015-12-30 VITALS — BP 129/72 | HR 87 | Ht 76.0 in | Wt 358.0 lb

## 2015-12-30 DIAGNOSIS — E118 Type 2 diabetes mellitus with unspecified complications: Secondary | ICD-10-CM

## 2015-12-30 DIAGNOSIS — Z794 Long term (current) use of insulin: Secondary | ICD-10-CM | POA: Diagnosis not present

## 2015-12-30 DIAGNOSIS — E785 Hyperlipidemia, unspecified: Secondary | ICD-10-CM

## 2015-12-30 DIAGNOSIS — IMO0002 Reserved for concepts with insufficient information to code with codable children: Secondary | ICD-10-CM

## 2015-12-30 DIAGNOSIS — E1165 Type 2 diabetes mellitus with hyperglycemia: Secondary | ICD-10-CM

## 2015-12-30 NOTE — Progress Notes (Signed)
Subjective:    Patient ID: Kenneth Patrick, male    DOB: 06-04-1951, PCP [name]., MD   Past Medical History  Diagnosis Date  . Diabetic neuropathy ([place])   . Respiratory infection   . Low back pain   . Asthma   . Dyspnea   . Sleep apnea   . Depression   . DM2 (diabetes mellitus, type 2) ([place])   . Hypercholesterolemia    Past Surgical History  Procedure Laterality Date  . Cholecystectomy    . Left eye enucleation following trauma     Social History   Social History  . Marital Status: Married    Spouse Name: N/A  . Number of Children: N/A  . Years of Education: N/A   Occupational History  . retired Engineer, structural    Social History Main Topics  . Smoking status: Former Smoker -- 1.50 packs/day for 20 years    Quit date: 10/05/2004  . Smokeless tobacco: Never Used  . Alcohol Use: No  . Drug Use: No     Comment: stopped smoking marijuana in 2006  . Sexual Activity: Not Asked   Other Topics Concern  . None   Social History Narrative   Outpatient Encounter Prescriptions as of 12/30/2015  Medication Sig  . albuterol (VENTOLIN HFA) 108 (90 BASE) MCG/ACT inhaler Inhale 2 puffs into the lungs every 6 (six) hours as needed.    [name] atorvastatin (LIPITOR) 80 MG tablet Take 80 mg by mouth At bedtime.   . fluticasone furoate-vilanterol (BREO ELLIPTA) 100-25 MCG/INH AEPB Take 1 puff by mouth daily.  . furosemide (LASIX) 40 MG tablet Take 40 mg by mouth daily as needed for fluid.   [name] gabapentin (NEURONTIN) 300 MG capsule Take 300 mg by mouth 2 (two) times daily.   . insulin lispro (HUMALOG KWIKPEN) 100 UNIT/ML KiwkPen Inject 0.2-0.26 mLs (20-26 Units total) into the skin 3 (three) times daily with meals.  [name] LEVEMIR 100 UNIT/ML injection Inject 90 Units into the skin at bedtime.   . metFORMIN (GLUCOPHAGE) 500 MG tablet Take 1 tablet by mouth 2 (two) times daily.  [name] oxymetazoline (AFRIN) 0.05 % nasal spray Place 1-2 sprays into the nose 2 (two) times daily as needed.   .  sertraline (ZOLOFT) 100 MG tablet Take 200 mg by mouth daily.   [name] VICTOZA 18 MG/3ML SOLN Inject 1.8 Units into the skin daily.    No facility-administered encounter medications on file as of 12/30/2015.   ALLERGIES: Allergies  Allergen Reactions  . Penicillins     Childhood allergy    VACCINATION STATUS: Immunization History  Administered Date(s) Administered  . Influenza Split 10/23/2011  . Influenza Whole 09/04/2010  . Influenza,inj,Quad PF,36+ Mos 06/08/2013, 09/03/2014, 07/31/2015  . Pneumococcal Conjugate-13 03/05/2015  . Pneumococcal Polysaccharide-23 05/14/2011, 11/27/2013    Diabetes He presents for his follow-up diabetic visit. He has type 2 diabetes mellitus. Onset time: He was diagnosed at approximate age of 59 years. His disease course has been stable. There are no hypoglycemic associated symptoms. Pertinent negatives for hypoglycemia include no confusion, headaches, pallor or seizures. There are no diabetic associated symptoms. Pertinent negatives for diabetes include no chest pain, no fatigue, no polydipsia, no polyphagia, no polyuria and no weakness. There are no hypoglycemic complications. Symptoms are stable. There are no diabetic complications. Risk factors for coronary artery disease include diabetes mellitus, dyslipidemia, hypertension, male sex, obesity, sedentary lifestyle and tobacco exposure. Current diabetic treatment includes insulin injections. He is compliant with treatment some  of the time. His weight is stable. He is following a generally unhealthy diet. When asked about meal planning, he reported none. He has had a previous visit with a dietitian. He never participates in exercise. His home blood glucose trend is increasing steadily. His breakfast blood glucose range is generally 180-200 mg/dl. His overall blood glucose range is 180-200 mg/dl. (He did not bring his logs, his meter shows average blood glucose of 200-216.) Eye exam is current.  Hyperlipidemia This  is a chronic problem. The current episode started more than 1 year ago. Pertinent negatives include no chest pain, myalgias or shortness of breath. Current antihyperlipidemic treatment includes statins. Compliance problems include adherence to diet and adherence to exercise.  Risk factors for coronary artery disease include dyslipidemia, diabetes mellitus, obesity, male sex and a sedentary lifestyle.  Hypertension This is a chronic problem. The current episode started more than 1 year ago. Pertinent negatives include no chest pain, headaches, neck pain, palpitations or shortness of breath. Risk factors for coronary artery disease include diabetes mellitus, dyslipidemia, male gender, obesity, smoking/tobacco exposure and sedentary lifestyle. Compliance problems include diet and exercise.      Review of Systems  Constitutional: Negative for fever, chills, fatigue and unexpected weight change.  HENT: Negative for dental problem, mouth sores and trouble swallowing.   Eyes: Negative for visual disturbance.  Respiratory: Negative for cough, choking, chest tightness, shortness of breath and wheezing.   Cardiovascular: Negative for chest pain, palpitations and leg swelling.  Gastrointestinal: Negative for nausea, vomiting, abdominal pain, diarrhea, constipation and abdominal distention.  Endocrine: Negative for polydipsia, polyphagia and polyuria.  Genitourinary: Negative for dysuria, urgency, hematuria and flank pain.  Musculoskeletal: Negative for myalgias, back pain, gait problem and neck pain.  Skin: Negative for pallor, rash and wound.  Neurological: Negative for seizures, syncope, weakness, numbness and headaches.  Psychiatric/Behavioral: Negative.  Negative for confusion and dysphoric mood.    Objective:    BP 129/72 mmHg  Pulse 87  Ht '6\' 4"'$  (1.93 m)  Wt 358 lb (162.388 kg)  BMI 43.60 kg/m2  SpO2 95%  Wt Readings from Last 3 Encounters:  12/30/15 358 lb (162.388 kg)  12/02/15 254 lb  (115.214 kg)  09/26/15 358 lb (162.388 kg)    Physical Exam  Constitutional: He is oriented to person, place, and time. He appears well-developed and well-nourished. He is cooperative. No distress.  HENT:  Head: Normocephalic and atraumatic.  Neck: Normal range of motion. Neck supple. No tracheal deviation present. No thyromegaly present.  Cardiovascular: Normal rate, S1 normal, S2 normal and normal heart sounds.  Exam reveals no gallop.   No murmur heard. Pulses:      Dorsalis pedis pulses are 1+ on the right side, and 1+ on the left side.       Posterior tibial pulses are 1+ on the right side, and 1+ on the left side.  Pulmonary/Chest: Breath sounds normal. No respiratory distress. He has no wheezes.  Abdominal: Soft. Bowel sounds are normal. He exhibits no distension. There is no tenderness. There is no guarding and no CVA tenderness.  Musculoskeletal: He exhibits no edema.       Right shoulder: He exhibits no swelling and no deformity.  Neurological: He is alert and oriented to person, place, and time. He has normal strength and normal reflexes. No cranial nerve deficit or sensory deficit. Gait normal.  Skin: Skin is warm and dry. No rash noted. No cyanosis. Nails show no clubbing.  Psychiatric: He has a  normal mood and affect. His speech is normal and behavior is normal. Judgment and thought content normal. Cognition and memory are normal.       Diabetic Labs (most recent): Lab Results  Component Value Date   HGBA1C 8.2* 12/18/2015   HGBA1C 8.2 09/09/2015   HGBA1C 7.5 06/17/2015     Assessment & Plan:   1. Uncontrolled type 2 diabetes mellitus with complication, with long-term current use of insulin ([place])  - his diabetes is  complicated by morbid obesity and sleep apnea and patient remains at a high risk for more acute and chronic complications of diabetes which include CAD, CVA, CKD, retinopathy, and neuropathy. These are all discussed in detail with the  patient.  Patient came with above target glucose profile, and  recent A1c stays at 8.2 %.  Glucose logs and insulin administration records pertaining to this visit,  to be scanned into patient's records.  Recent labs reviewed.   - I have re-counseled the patient on diet management and weight loss  by adopting a carbohydrate restricted / protein rich  Diet.  - Suggestion is made for patient to avoid simple carbohydrates   from their diet including Cakes , Desserts, Ice Cream,  Soda (  diet and regular) , Sweet Tea , Candies,  Chips, Cookies, Artificial Sweeteners,   and "Sugar-free" Products .  This will help patient to have stable blood glucose profile and potentially avoid unintended  Weight gain.  - Patient is advised to stick to a routine mealtimes to eat 3 meals  a day and avoid unnecessary snacks ( to snack only to correct hypoglycemia).  - The patient  has been  scheduled with Jearld Fenton, RDN, CDE for individualized DM education.  - I have approached patient with the following individualized plan to manage diabetes and patient agrees.  - Continue basal insulin Levemir  90 units qhs, and   Humalog  20 units TIDAC DAC for pre-meal BG readings of 90-'150mg'$ /dl, plus patient specific correction dose of rapid acting insulin for unexpected hyperglycemia above '150mg'$ /dl, associated with strict monitoring of BG AC and HS.  -Adjustment parameters for hypo and hyperglycemia were given in a written document to patient. -Patient is encouraged to call clinic for blood glucose levels less than 70 or above 300 mg /dl. -He will continue on Victoza 1.8 mg subcutaneous daily . -I will continue metformin '500mg'$  po BID, did not tolerate Invokana.   - Patient specific target  for A1c; LDL, HDL, Triglycerides, and  Waist Circumference were discussed in detail.  2) BP/HTN : controlled. Continue current medications. 3) Lipids/HPL:  continue statins. 4)  Weight/Diet: CDE consult in progress, exercise, and  carbohydrates information provided.  5) Chronic Care/Health Maintenance:  -Patient is on ACEI/ARB and Statin medications and encouraged to continue to follow up with Ophthalmology, Podiatrist at least yearly or according to recommendations, and advised to  stay away from smoking. I have recommended yearly flu vaccine and pneumonia vaccination at least every 5 years; moderate intensity exercise for up to 150 minutes weekly; and  sleep for at least 7 hours a day.  - 25 minutes of time was spent on the care of this patient , 50% of which was applied for counseling on diabetes complications and their preventions.  - I advised patient to maintain close follow up with VYAS,DHRUV B., MD for primary care needs.  Patient is asked to bring meter and  blood glucose logs during their next visit.   Follow up plan: -Return  in about 3 months (around 03/31/2016) for diabetes, high cholesterol, follow up with pre-visit labs, meter, and logs.  Glade Lloyd, MD Phone: [phone]  Fax: [phone]   12/30/2015, 3:54 PM

## 2015-12-30 NOTE — Patient Instructions (Signed)

## 2016-02-22 ENCOUNTER — Other Ambulatory Visit: Payer: Self-pay | Admitting: "Endocrinology

## 2016-02-22 ENCOUNTER — Other Ambulatory Visit: Payer: Self-pay | Admitting: Pulmonary Disease

## 2016-03-26 ENCOUNTER — Other Ambulatory Visit: Payer: Self-pay | Admitting: "Endocrinology

## 2016-03-26 LAB — HEMOGLOBIN A1C
HEMOGLOBIN A1C: 7.6 % — AB (ref <5.7)
MEAN PLASMA GLUCOSE: 171 mg/dL

## 2016-03-26 LAB — BASIC METABOLIC PANEL
BUN: 19 mg/dL (ref 7–25)
CO2: 26 mmol/L (ref 20–31)
CREATININE: 1.26 mg/dL — AB (ref 0.70–1.25)
Calcium: 8.8 mg/dL (ref 8.6–10.3)
Chloride: 107 mmol/L (ref 98–110)
Glucose, Bld: 86 mg/dL (ref 65–99)
Potassium: 5.3 mmol/L (ref 3.5–5.3)
Sodium: 142 mmol/L (ref 135–146)

## 2016-04-01 ENCOUNTER — Other Ambulatory Visit: Payer: Self-pay

## 2016-04-01 MED ORDER — INSULIN DETEMIR 100 UNIT/ML ~~LOC~~ SOLN: 90.0000 [IU] | Freq: Every day | SUBCUTANEOUS | Status: DC

## 2016-04-01 MED ORDER — INSULIN LISPRO 100 UNIT/ML ~~LOC~~ SOLN: 20.0000 [IU] | Freq: Three times a day (TID) | SUBCUTANEOUS | Status: DC

## 2016-04-03 ENCOUNTER — Ambulatory Visit (INDEPENDENT_AMBULATORY_CARE_PROVIDER_SITE_OTHER): Payer: 59 | Admitting: "Endocrinology

## 2016-04-03 ENCOUNTER — Encounter: Payer: Self-pay | Admitting: "Endocrinology

## 2016-04-03 VITALS — BP 139/71 | HR 85 | Ht 76.0 in | Wt 363.0 lb

## 2016-04-03 DIAGNOSIS — E669 Obesity, unspecified: Secondary | ICD-10-CM | POA: Diagnosis not present

## 2016-04-03 DIAGNOSIS — E785 Hyperlipidemia, unspecified: Secondary | ICD-10-CM | POA: Diagnosis not present

## 2016-04-03 DIAGNOSIS — E118 Type 2 diabetes mellitus with unspecified complications: Secondary | ICD-10-CM

## 2016-04-03 DIAGNOSIS — Z794 Long term (current) use of insulin: Secondary | ICD-10-CM | POA: Diagnosis not present

## 2016-04-03 DIAGNOSIS — IMO0002 Reserved for concepts with insufficient information to code with codable children: Secondary | ICD-10-CM

## 2016-04-03 DIAGNOSIS — E1165 Type 2 diabetes mellitus with hyperglycemia: Secondary | ICD-10-CM | POA: Diagnosis not present

## 2016-04-03 DIAGNOSIS — I10 Essential (primary) hypertension: Secondary | ICD-10-CM | POA: Diagnosis not present

## 2016-04-03 MED ORDER — INSULIN LISPRO 100 UNIT/ML ~~LOC~~ SOLN: 20.0000 [IU] | Freq: Three times a day (TID) | SUBCUTANEOUS | Status: DC

## 2016-04-03 NOTE — Patient Instructions (Signed)

## 2016-04-03 NOTE — Progress Notes (Signed)
Subjective:    Patient ID: Kenneth Patrick, male    DOB: October 26, 1950, PCP Kenneth Chroman, MD   Past Medical History  Diagnosis Date  . Diabetic neuropathy ([place])   . Respiratory infection   . Low back pain   . Asthma   . Dyspnea   . Sleep apnea   . Depression   . DM2 (diabetes mellitus, type 2) ([place])   . Hypercholesterolemia    Past Surgical History  Procedure Laterality Date  . Cholecystectomy    . Left eye enucleation following trauma     Social History   Social History  . Marital Status: Married    Spouse Name: N/A  . Number of Children: N/A  . Years of Education: N/A   Occupational History  . retired Engineer, structural    Social History Main Topics  . Smoking status: Former Smoker -- 1.50 packs/day for 20 years    Quit date: 10/05/2004  . Smokeless tobacco: Never Used  . Alcohol Use: No  . Drug Use: No     Comment: stopped smoking marijuana in 2006  . Sexual Activity: Not Asked   Other Topics Concern  . None   Social History Narrative   Outpatient Encounter Prescriptions as of 04/03/2016  Medication Sig  . albuterol (VENTOLIN HFA) 108 (90 BASE) MCG/ACT inhaler Inhale 2 puffs into the lungs every 6 (six) hours as needed.    [name] atorvastatin (LIPITOR) 80 MG tablet Take 80 mg by mouth At bedtime.   [name] BREO ELLIPTA 100-25 MCG/INH AEPB INHALE 1 PUFF INTO LUNGS  DAILY; (Exp 42 days after  opening)  . furosemide (LASIX) 40 MG tablet Take 40 mg by mouth daily as needed for fluid.   [name] gabapentin (NEURONTIN) 300 MG capsule Take 300 mg by mouth 2 (two) times daily.   . insulin detemir (LEVEMIR) 100 UNIT/ML injection Inject 0.9 mLs (90 Units total) into the skin at bedtime.  . insulin lispro (HUMALOG) 100 UNIT/ML injection Inject 0.2-0.26 mLs (20-26 Units total) into the skin 3 (three) times daily before meals.  . metFORMIN (GLUCOPHAGE) 500 MG tablet Take 1 tablet by mouth 2 (two) times daily.  [name] oxymetazoline (AFRIN) 0.05 % nasal spray Place 1-2 sprays into the nose 2 (two)  times daily as needed.   . sertraline (ZOLOFT) 100 MG tablet Take 200 mg by mouth daily.   [name] VICTOZA 18 MG/3ML SOLN Inject 1.8 Units into the skin daily.   . [DISCONTINUED] insulin lispro (HUMALOG) 100 UNIT/ML injection Inject 0.2-0.26 mLs (20-26 Units total) into the skin 3 (three) times daily before meals.   No facility-administered encounter medications on file as of 04/03/2016.   ALLERGIES: Allergies  Allergen Reactions  . Penicillins     Childhood allergy    VACCINATION STATUS: Immunization History  Administered Date(s) Administered  . Influenza Split 10/23/2011  . Influenza Whole 09/04/2010  . Influenza,inj,Quad PF,36+ Mos 06/08/2013, 09/03/2014, 07/31/2015  . Pneumococcal Conjugate-13 03/05/2015  . Pneumococcal Polysaccharide-23 05/14/2011, 11/27/2013    Diabetes He presents for his follow-up diabetic visit. He has type 2 diabetes mellitus. Onset time: He was diagnosed at approximate age of 31 years. His disease course has been improving. There are no hypoglycemic associated symptoms. Pertinent negatives for hypoglycemia include no confusion, headaches, pallor or seizures. There are no diabetic associated symptoms. Pertinent negatives for diabetes include no chest pain, no fatigue, no polydipsia, no polyphagia, no polyuria and no weakness. There are no hypoglycemic complications. Symptoms are improving. There are no  diabetic complications. Risk factors for coronary artery disease include diabetes mellitus, dyslipidemia, hypertension, male sex, obesity, sedentary lifestyle and tobacco exposure. Current diabetic treatment includes insulin injections. He is compliant with treatment some of the time. His weight is increasing steadily. He is following a generally unhealthy diet. When asked about meal planning, he reported none. He has had a previous visit with a dietitian. He never participates in exercise. His home blood glucose trend is increasing steadily. His breakfast blood glucose  range is generally 140-180 mg/dl. His lunch blood glucose range is generally 140-180 mg/dl. His dinner blood glucose range is generally 140-180 mg/dl. His overall blood glucose range is 140-180 mg/dl. (He did not bring his logs, his meter shows average blood glucose of 200-216.) Eye exam is current.  Hyperlipidemia This is a chronic problem. The current episode started more than 1 year ago. Pertinent negatives include no chest pain, myalgias or shortness of breath. Current antihyperlipidemic treatment includes statins. Compliance problems include adherence to diet and adherence to exercise.  Risk factors for coronary artery disease include dyslipidemia, diabetes mellitus, obesity, male sex and a sedentary lifestyle.  Hypertension This is a chronic problem. The current episode started more than 1 year ago. Pertinent negatives include no chest pain, headaches, neck pain, palpitations or shortness of breath. Risk factors for coronary artery disease include diabetes mellitus, dyslipidemia, male gender, obesity, smoking/tobacco exposure and sedentary lifestyle. Compliance problems include diet and exercise.      Review of Systems  Constitutional: Negative for fever, chills, fatigue and unexpected weight change.  HENT: Negative for dental problem, mouth sores and trouble swallowing.   Eyes: Negative for visual disturbance.  Respiratory: Negative for cough, choking, chest tightness, shortness of breath and wheezing.   Cardiovascular: Negative for chest pain, palpitations and leg swelling.  Gastrointestinal: Negative for nausea, vomiting, abdominal pain, diarrhea, constipation and abdominal distention.  Endocrine: Negative for polydipsia, polyphagia and polyuria.  Genitourinary: Negative for dysuria, urgency, hematuria and flank pain.  Musculoskeletal: Negative for myalgias, back pain, gait problem and neck pain.  Skin: Negative for pallor, rash and wound.  Neurological: Negative for seizures, syncope,  weakness, numbness and headaches.  Psychiatric/Behavioral: Negative.  Negative for confusion and dysphoric mood.    Objective:    BP 139/71 mmHg  Pulse 85  Ht '6\' 4"'$  (1.93 m)  Wt 363 lb (164.656 kg)  BMI 44.20 kg/m2  SpO2 96%  Wt Readings from Last 3 Encounters:  04/03/16 363 lb (164.656 kg)  12/30/15 358 lb (162.388 kg)  12/02/15 254 lb (115.214 kg)    Physical Exam  Constitutional: He is oriented to person, place, and time. He appears well-developed and well-nourished. He is cooperative. No distress.  HENT:  Head: Normocephalic and atraumatic.  Neck: Normal range of motion. Neck supple. No tracheal deviation present. No thyromegaly present.  Cardiovascular: Normal rate, S1 normal, S2 normal and normal heart sounds.  Exam reveals no gallop.   No murmur heard. Pulses:      Dorsalis pedis pulses are 1+ on the right side, and 1+ on the left side.       Posterior tibial pulses are 1+ on the right side, and 1+ on the left side.  Pulmonary/Chest: Breath sounds normal. No respiratory distress. He has no wheezes.  Abdominal: Soft. Bowel sounds are normal. He exhibits no distension. There is no tenderness. There is no guarding and no CVA tenderness.  Musculoskeletal: He exhibits no edema.       Right shoulder: He exhibits no swelling  and no deformity.  Neurological: He is alert and oriented to person, place, and time. He has normal strength and normal reflexes. No cranial nerve deficit or sensory deficit. Gait normal.  Skin: Skin is warm and dry. No rash noted. No cyanosis. Nails show no clubbing.  Psychiatric: He has a normal mood and affect. His speech is normal and behavior is normal. Judgment and thought content normal. Cognition and memory are normal.       Diabetic Labs (most recent): Lab Results  Component Value Date   HGBA1C 7.6* 03/26/2016   HGBA1C 8.2* 12/18/2015   HGBA1C 8.2 09/09/2015   Recent Results (from the past 2160 hour(s))  Basic metabolic panel     Status:  Abnormal   Collection Time: 03/26/16 11:53 AM  Result Value Ref Range   Sodium 142 135 - 146 mmol/L   Potassium 5.3 3.5 - 5.3 mmol/L   Chloride 107 98 - 110 mmol/L   CO2 26 20 - 31 mmol/L   Glucose, Bld 86 65 - 99 mg/dL   BUN 19 7 - 25 mg/dL   Creat 1.26 (H) 0.70 - 1.25 mg/dL    Comment:   For patients > or = 65 years of age: The upper reference limit for Creatinine is approximately 13% higher for people identified as African-American.      Calcium 8.8 8.6 - 10.3 mg/dL  Hemoglobin A1c     Status: Abnormal   Collection Time: 03/26/16 11:53 AM  Result Value Ref Range   Hgb A1c MFr Bld 7.6 (H) <5.7 %    Comment:   For someone without known diabetes, a hemoglobin A1c value of 6.5% or greater indicates that they may have diabetes and this should be confirmed with a follow-up test.   For someone with known diabetes, a value <7% indicates that their diabetes is well controlled and a value greater than or equal to 7% indicates suboptimal control. A1c targets should be individualized based on duration of diabetes, age, comorbid conditions, and other considerations.   Currently, no consensus exists for use of hemoglobin A1c for diagnosis of diabetes for children.      Mean Plasma Glucose 171 mg/dL    Assessment & Plan:   1. Uncontrolled type 2 diabetes mellitus with complication, with long-term current use of insulin ([place])  - his diabetes is  complicated by morbid obesity and sleep apnea and patient remains at a high risk for more acute and chronic complications of diabetes which include CAD, CVA, CKD, retinopathy, and neuropathy. These are all discussed in detail with the patient.  Patient came Improved but still slightly above target glucose profile, and  recent A1c  7.6% improving from  8.2 %.  Glucose logs and insulin administration records pertaining to this visit,  to be scanned into patient's records.  Recent labs reviewed.   - I have re-counseled the patient on diet  management and weight loss  by adopting a carbohydrate restricted / protein rich  Diet.  - Suggestion is made for patient to avoid simple carbohydrates   from their diet including Cakes , Desserts, Ice Cream,  Soda (  diet and regular) , Sweet Tea , Candies,  Chips, Cookies, Artificial Sweeteners,   and "Sugar-free" Products .  This will help patient to have stable blood glucose profile and potentially avoid unintended  Weight gain.  - Patient is advised to stick to a routine mealtimes to eat 3 meals  a day and avoid unnecessary snacks ( to snack only  to correct hypoglycemia).  - The patient  has been  scheduled with Jearld Fenton, RDN, CDE for individualized DM education.  - I have approached patient with the following individualized plan to manage diabetes and patient agrees.  - Continue basal insulin Levemir  90 units qhs, and   Humalog  20 units TIDAC DAC for pre-meal BG readings of 90-'150mg'$ /dl, plus patient specific correction dose of rapid acting insulin for unexpected hyperglycemia above '150mg'$ /dl, associated with strict monitoring of BG AC and HS.  -Adjustment parameters for hypo and hyperglycemia were given in a written document to patient. -Patient is encouraged to call clinic for blood glucose levels less than 70 or above 300 mg /dl. -He will continue on Victoza 1.8 mg subcutaneous daily . -I will continue metformin '500mg'$  po BID, did not tolerate Invokana.   - Patient specific target  for A1c; LDL, HDL, Triglycerides, and  Waist Circumference were discussed in detail.  2) BP/HTN : controlled. Continue current medications. 3) Lipids/HPL:  continue statins. 4)  Weight/Diet: CDE consult in progress, exercise, and carbohydrates information provided.  5) Chronic Care/Health Maintenance:  -Patient is on ACEI/ARB and Statin medications and encouraged to continue to follow up with Ophthalmology, Podiatrist at least yearly or according to recommendations, and advised to  stay away from  smoking. I have recommended yearly flu vaccine and pneumonia vaccination at least every 5 years; moderate intensity exercise for up to 150 minutes weekly; and  sleep for at least 7 hours a day.  - 25 minutes of time was spent on the care of this patient , 50% of which was applied for counseling on diabetes complications and their preventions.  - I advised patient to maintain close follow up with Kenneth Chroman, MD for primary care needs.  Patient is asked to bring meter and  blood glucose logs during their next visit.   Follow up plan: -Return in about 3 months (around 07/04/2016) for follow up with pre-visit labs, meter, and logs, diabetes, high blood pressure, high cholesterol.  Glade Lloyd, MD Phone: [phone]  Fax: [phone]   04/03/2016, 3:17 PM

## 2016-05-14 ENCOUNTER — Other Ambulatory Visit: Payer: Self-pay | Admitting: Pulmonary Disease

## 2016-05-27 ENCOUNTER — Other Ambulatory Visit: Payer: Self-pay | Admitting: "Endocrinology

## 2016-06-09 ENCOUNTER — Other Ambulatory Visit: Payer: Self-pay

## 2016-06-09 MED ORDER — INSULIN LISPRO 100 UNIT/ML ~~LOC~~ SOLN: SUBCUTANEOUS | 0 refills | Status: DC

## 2016-06-22 ENCOUNTER — Encounter: Payer: Self-pay | Admitting: Pulmonary Disease

## 2016-06-22 ENCOUNTER — Ambulatory Visit (INDEPENDENT_AMBULATORY_CARE_PROVIDER_SITE_OTHER): Payer: 59 | Admitting: Pulmonary Disease

## 2016-06-22 DIAGNOSIS — Z23 Encounter for immunization: Secondary | ICD-10-CM | POA: Diagnosis not present

## 2016-06-22 DIAGNOSIS — J449 Chronic obstructive pulmonary disease, unspecified: Secondary | ICD-10-CM | POA: Diagnosis not present

## 2016-06-22 DIAGNOSIS — G4733 Obstructive sleep apnea (adult) (pediatric): Secondary | ICD-10-CM

## 2016-06-22 MED ORDER — UMECLIDINIUM-VILANTEROL 62.5-25 MCG/INH IN AEPB: 1.0000 | INHALATION_SPRAY | Freq: Every day | RESPIRATORY_TRACT | 0 refills | Status: DC

## 2016-06-22 NOTE — Addendum Note (Signed)
Addended by: Mathis Dad on: 06/22/2016 04:57 PM   Modules accepted: Orders

## 2016-06-22 NOTE — Patient Instructions (Signed)
Take Lasix 40 mg daily for 5 days and then go back to using as needed  Prescription for CPAP 14 cm of be sent to DME, download will be checked in 4 weeks  Trial of ANORO instead of BREO - call us for prescription if this works

## 2016-06-22 NOTE — Assessment & Plan Note (Signed)
Take Lasix 40 mg daily for 5 days and then go back to using as needed   Trial of ANORO instead of BREO - call us for prescription if this works

## 2016-06-22 NOTE — Addendum Note (Signed)
Addended by: Mathis Dad on: 06/22/2016 05:01 PM   Modules accepted: Orders

## 2016-06-22 NOTE — Progress Notes (Signed)
   Subjective:    Patient ID: Kenneth Patrick, male    DOB: 1950-12-06, 65 y.o.   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  Told 'asthma' many years ago, has used inhalers & diskus c/o wheezing on perfumes, cooking  He smoked > 60 Pyrs before quitting in 2006.  Gets CPAP supplies from online website     06/22/2016  Chief Complaint  Patient presents with  . Sleep Apnea    Wears CPAP every night, does not have SD card.  Having more labored breathing lately.     Several issues today - His breathing is slightly worse, occasional wheezing, Spiriva seems to have fallen office medication list he is only on breo He reports increasing pedal edema for the last few weeks. He takes Lasix as needed only because he is worried about his kidneys burning out Denies orthopnea or paroxysmal nocturnal dyspnea  His CPAP machine is about 65 years old, I note an old titration study from 2008 which showed 14 cm. He will be going on Medicare next month. He wonders if he can get a new machine  Significant tests/ events  Spirometry 2011-  severe airway obstruction with a low ratio & FEV1 of 25 %, good effort !   Meds -12/2012 -Added spiriva - self dc'd -Does not feel he needs it, thought it caused leg swelling. 06/2013 -Breo in place of Symbicort   Admitted Feb 2015 -for acute hypoxic respiratory failure and AKI/metabolic acidosis in the setting of RSV bronchitis.  CT chest showed bilateral lower lobe bronchiectasis.  2-D echo showed grade 1 diastolic dysfunction with preserved EF.  Had a low titer c-ANCA , Positive  Seen by ID , HIV neg.  Histoplasma ag positive in urine , but serology neg . (lived in [place] 10 yrs ago)   03/2007 CPAP 14 cm     Review of Systems neg for any significant sore throat, dysphagia, itching, sneezing, nasal congestion or excess/ purulent secretions, fever, chills, sweats, unintended wt loss,  pleuritic or exertional cp, hempoptysis, orthopnea pnd or change in chronic leg swelling. Also denies presyncope, palpitations, heartburn, abdominal pain, nausea, vomiting, diarrhea or change in bowel or urinary habits, dysuria,hematuria, rash, arthralgias, visual complaints, headache, numbness weakness or ataxia.     Objective:   Physical Exam  Gen. Pleasant, obese, in no distress ENT - no lesions, no post nasal drip Neck: No JVD, no thyromegaly, no carotid bruits Lungs: no use of accessory muscles, no dullness to percussion, decreased without rales or rhonchi  Cardiovascular: Rhythm regular, heart sounds  normal, no murmurs or gallops, no peripheral edema Musculoskeletal: No deformities, no cyanosis or clubbing , no tremors       Assessment & Plan:

## 2016-06-22 NOTE — Assessment & Plan Note (Signed)
Prescription for CPAP 14 cm of be sent to DME, download will be checked in 4 weeks  Weight loss encouraged, compliance with goal of at least 4-6 hrs every night is the expectation. Advised against medications with sedative side effects Cautioned against driving when sleepy - understanding that sleepiness will vary on a day to day basis

## 2016-06-26 ENCOUNTER — Other Ambulatory Visit: Payer: Self-pay | Admitting: "Endocrinology

## 2016-06-26 ENCOUNTER — Telehealth: Payer: Self-pay | Admitting: Pulmonary Disease

## 2016-06-26 DIAGNOSIS — G4733 Obstructive sleep apnea (adult) (pediatric): Secondary | ICD-10-CM

## 2016-06-26 MED ORDER — UMECLIDINIUM-VILANTEROL 62.5-25 MCG/INH IN AEPB: 1.0000 | INHALATION_SPRAY | Freq: Every day | RESPIRATORY_TRACT | 1 refills | Status: DC

## 2016-06-26 NOTE — Telephone Encounter (Signed)
Pt needs new Sleep Study in order to get CPAP machine. Pt has not had a sleep study in 20-25 and has only recently had a CPAP titration which will not work with insurance in covering his CPAP.  Pt is requesting if possible to do a HST instead of an In-Lab study. Aware that we will call back next week once Dr Elsworth Soho responds.   Please advise Dr Elsworth Soho. Thanks.

## 2016-06-26 NOTE — Telephone Encounter (Signed)
Pt last seen 9.18.17: Patient Instructions  Take Lasix 40 mg daily for 5 days and then go back to using as needed   Prescription for CPAP 14 cm of be sent to DME, download will be checked in 4 weeks   Trial of ANORO instead of BREO - call us for prescription if this works   [employer] spoke with patient who verified that the Anoro is working well for him and he would like Rx sent to [facility] This has been done Nothing further needed; will sign off

## 2016-06-27 LAB — COMPLETE METABOLIC PANEL WITH GFR
ALT: 41 U/L (ref 9–46)
AST: 49 U/L — ABNORMAL HIGH (ref 10–35)
Albumin: 4.2 g/dL (ref 3.6–5.1)
Alkaline Phosphatase: 120 U/L — ABNORMAL HIGH (ref 40–115)
BUN: 29 mg/dL — ABNORMAL HIGH (ref 7–25)
CHLORIDE: 107 mmol/L (ref 98–110)
CO2: 24 mmol/L (ref 20–31)
Calcium: 9.2 mg/dL (ref 8.6–10.3)
Creat: 1.27 mg/dL — ABNORMAL HIGH (ref 0.70–1.25)
GFR, EST AFRICAN AMERICAN: 69 mL/min (ref >=60)
GFR, EST NON AFRICAN AMERICAN: 59 mL/min — AB (ref >=60)
Glucose, Bld: 82 mg/dL (ref 65–99)
Potassium: 4.6 mmol/L (ref 3.5–5.3)
Sodium: 141 mmol/L (ref 135–146)
Total Bilirubin: 1.1 mg/dL (ref 0.2–1.2)
Total Protein: 6.7 g/dL (ref 6.1–8.1)

## 2016-06-27 LAB — LIPID PANEL
Cholesterol: 174 mg/dL (ref 125–200)
HDL: 43 mg/dL (ref >=40)
LDL CALC: 109 mg/dL (ref <130)
TRIGLYCERIDES: 109 mg/dL (ref <150)
Total CHOL/HDL Ratio: 4 Ratio (ref <=5.0)
VLDL: 22 mg/dL (ref <30)

## 2016-06-27 LAB — HEMOGLOBIN A1C
Hgb A1c MFr Bld: 7.5 % — ABNORMAL HIGH (ref <5.7)
Mean Plasma Glucose: 169 mg/dL

## 2016-06-29 NOTE — Telephone Encounter (Signed)
Okay to do home sleep study followed by CPAP titration

## 2016-06-29 NOTE — Telephone Encounter (Signed)
Spoke with pt. He is aware of RA's recommendation. Orders have been placed. Nothing further was needed.

## 2016-07-06 ENCOUNTER — Ambulatory Visit (INDEPENDENT_AMBULATORY_CARE_PROVIDER_SITE_OTHER): Payer: Medicare Other | Admitting: "Endocrinology

## 2016-07-06 ENCOUNTER — Encounter: Payer: Self-pay | Admitting: "Endocrinology

## 2016-07-06 VITALS — BP 131/71 | HR 86 | Ht 76.0 in | Wt 372.0 lb

## 2016-07-06 DIAGNOSIS — E1165 Type 2 diabetes mellitus with hyperglycemia: Secondary | ICD-10-CM | POA: Diagnosis not present

## 2016-07-06 DIAGNOSIS — Z794 Long term (current) use of insulin: Secondary | ICD-10-CM

## 2016-07-06 DIAGNOSIS — E118 Type 2 diabetes mellitus with unspecified complications: Secondary | ICD-10-CM

## 2016-07-06 DIAGNOSIS — I10 Essential (primary) hypertension: Secondary | ICD-10-CM

## 2016-07-06 DIAGNOSIS — IMO0002 Reserved for concepts with insufficient information to code with codable children: Secondary | ICD-10-CM

## 2016-07-06 DIAGNOSIS — E782 Mixed hyperlipidemia: Secondary | ICD-10-CM | POA: Diagnosis not present

## 2016-07-06 NOTE — Progress Notes (Signed)
Subjective:    Patient ID: Kenneth Patrick, male    DOB: 15-Dec-1950, PCP [name], MD   Past Medical History:  Diagnosis Date  . Asthma   . Depression   . Diabetic neuropathy ([place])   . DM2 (diabetes mellitus, type 2) ([place])   . Dyspnea   . Hypercholesterolemia   . Low back pain   . Respiratory infection   . Sleep apnea    Past Surgical History:  Procedure Laterality Date  . CHOLECYSTECTOMY    . left eye enucleation following trauma     Social History   Social History  . Marital status: Married    Spouse name: N/A  . Number of children: N/A  . Years of education: N/A   Occupational History  . retired Engineer, structural    Social History Main Topics  . Smoking status: Former Smoker    Packs/day: 1.50    Years: 20.00    Quit date: 10/05/2004  . Smokeless tobacco: Never Used  . Alcohol use No  . Drug use: No     Comment: stopped smoking marijuana in 2006  . Sexual activity: Not Asked   Other Topics Concern  . None   Social History Narrative  . None   Outpatient Encounter Prescriptions as of 07/06/2016  Medication Sig  . albuterol (VENTOLIN HFA) 108 (90 BASE) MCG/ACT inhaler Inhale 2 puffs into the lungs every 6 (six) hours as needed.    [name] atorvastatin (LIPITOR) 80 MG tablet Take 80 mg by mouth At bedtime.   . furosemide (LASIX) 40 MG tablet Take 40 mg by mouth daily as needed for fluid.   [name] gabapentin (NEURONTIN) 300 MG capsule Take 300 mg by mouth 2 (two) times daily.   . insulin detemir (LEVEMIR) 100 UNIT/ML injection Inject 0.9 mLs (90 Units total) into the skin at bedtime.  . insulin lispro (HUMALOG) 100 UNIT/ML injection Inject subcutaneously 20-26 units total 3 times daily  before meals  . metFORMIN (GLUCOPHAGE) 500 MG tablet Take 1 tablet by mouth 2 (two) times daily.  [name] oxymetazoline (AFRIN) 0.05 % nasal spray Place 1-2 sprays into the nose 2 (two) times daily as needed.   . sertraline (ZOLOFT) 100 MG tablet Take 200 mg by mouth daily.   [name]  umeclidinium-vilanterol (ANORO ELLIPTA) 62.5-25 MCG/INH AEPB Inhale 1 puff into the lungs daily.  [name] VICTOZA 18 MG/3ML SOLN Inject 1.8 Units into the skin daily.   . [DISCONTINUED] BREO ELLIPTA 100-25 MCG/INH AEPB INHALE 1 PUFF INTO LUNGS  DAILY   No facility-administered encounter medications on file as of 07/06/2016.    ALLERGIES: Allergies  Allergen Reactions  . Penicillins     Childhood allergy    VACCINATION STATUS: Immunization History  Administered Date(s) Administered  . Influenza Split 10/23/2011  . Influenza Whole 09/04/2010  . Influenza,inj,Quad PF,36+ Mos 06/08/2013, 09/03/2014, 07/31/2015, 06/22/2016  . Pneumococcal Conjugate-13 03/05/2015  . Pneumococcal Polysaccharide-23 05/14/2011, 11/27/2013    Diabetes  He presents for his follow-up diabetic visit. He has type 2 diabetes mellitus. Onset time: He was diagnosed at approximate age of 63 years. His disease course has been improving. There are no hypoglycemic associated symptoms. Pertinent negatives for hypoglycemia include no confusion, headaches, pallor or seizures. There are no diabetic associated symptoms. Pertinent negatives for diabetes include no chest pain, no fatigue, no polydipsia, no polyphagia, no polyuria and no weakness. There are no hypoglycemic complications. Symptoms are improving. There are no diabetic complications. Risk factors for coronary artery disease  include diabetes mellitus, dyslipidemia, hypertension, male sex, obesity, sedentary lifestyle and tobacco exposure. Current diabetic treatment includes insulin injections. He is compliant with treatment some of the time. His weight is increasing steadily. He is following a generally unhealthy diet. When asked about meal planning, he reported none. He has had a previous visit with a dietitian. He never participates in exercise. His home blood glucose trend is increasing steadily. His breakfast blood glucose range is generally 140-180 mg/dl. His lunch blood  glucose range is generally 140-180 mg/dl. His dinner blood glucose range is generally 140-180 mg/dl. His overall blood glucose range is 140-180 mg/dl. (He did not bring his logs, his meter shows average blood glucose of 200-216.) Eye exam is current.  Hyperlipidemia  This is a chronic problem. The current episode started more than 1 year ago. Pertinent negatives include no chest pain, myalgias or shortness of breath. Current antihyperlipidemic treatment includes statins. Compliance problems include adherence to diet and adherence to exercise.  Risk factors for coronary artery disease include dyslipidemia, diabetes mellitus, obesity, male sex and a sedentary lifestyle.  Hypertension  This is a chronic problem. The current episode started more than 1 year ago. Pertinent negatives include no chest pain, headaches, neck pain, palpitations or shortness of breath. Risk factors for coronary artery disease include diabetes mellitus, dyslipidemia, male gender, obesity, smoking/tobacco exposure and sedentary lifestyle. Compliance problems include diet and exercise.      Review of Systems  Constitutional: Negative for chills, fatigue, fever and unexpected weight change.  HENT: Negative for dental problem, mouth sores and trouble swallowing.   Eyes: Negative for visual disturbance.  Respiratory: Negative for cough, choking, chest tightness, shortness of breath and wheezing.   Cardiovascular: Negative for chest pain, palpitations and leg swelling.  Gastrointestinal: Negative for abdominal distention, abdominal pain, constipation, diarrhea, nausea and vomiting.  Endocrine: Negative for polydipsia, polyphagia and polyuria.  Genitourinary: Negative for dysuria, flank pain, hematuria and urgency.  Musculoskeletal: Negative for back pain, gait problem, myalgias and neck pain.  Skin: Negative for pallor, rash and wound.  Neurological: Negative for seizures, syncope, weakness, numbness and headaches.   Psychiatric/Behavioral: Negative.  Negative for confusion and dysphoric mood.    Objective:    BP 131/71   Pulse 86   Ht '6\' 4"'$  (1.93 m)   Wt (!) 372 lb (168.7 kg)   BMI 45.28 kg/m   Wt Readings from Last 3 Encounters:  07/06/16 (!) 372 lb (168.7 kg)  06/22/16 (!) 366 lb 12.8 oz (166.4 kg)  04/03/16 (!) 363 lb (164.7 kg)    Physical Exam  Constitutional: He is oriented to person, place, and time. He appears well-developed and well-nourished. He is cooperative. No distress.  HENT:  Head: Normocephalic and atraumatic.  Neck: Normal range of motion. Neck supple. No tracheal deviation present. No thyromegaly present.  Cardiovascular: Normal rate, S1 normal, S2 normal and normal heart sounds.  Exam reveals no gallop.   No murmur heard. Pulses:      Dorsalis pedis pulses are 1+ on the right side, and 1+ on the left side.       Posterior tibial pulses are 1+ on the right side, and 1+ on the left side.  Pulmonary/Chest: Breath sounds normal. No respiratory distress. He has no wheezes.  Abdominal: Soft. Bowel sounds are normal. He exhibits no distension. There is no tenderness. There is no guarding and no CVA tenderness.  Musculoskeletal: He exhibits no edema.       Right shoulder: He exhibits no  swelling and no deformity.  Neurological: He is alert and oriented to person, place, and time. He has normal strength and normal reflexes. No cranial nerve deficit or sensory deficit. Gait normal.  Skin: Skin is warm and dry. No rash noted. No cyanosis. Nails show no clubbing.  Psychiatric: He has a normal mood and affect. His speech is normal and behavior is normal. Judgment and thought content normal. Cognition and memory are normal.       Diabetic Labs (most recent): Lab Results  Component Value Date   HGBA1C 7.5 (H) 06/26/2016   HGBA1C 7.6 (H) 03/26/2016   HGBA1C 8.2 (H) 12/18/2015   Recent Results (from the past 2160 hour(s))  COMPLETE METABOLIC PANEL WITH GFR     Status: Abnormal    Collection Time: 06/26/16  2:03 PM  Result Value Ref Range   Sodium 141 135 - 146 mmol/L   Potassium 4.6 3.5 - 5.3 mmol/L   Chloride 107 98 - 110 mmol/L   CO2 24 20 - 31 mmol/L   Glucose, Bld 82 65 - 99 mg/dL   BUN 29 (H) 7 - 25 mg/dL   Creat 1.27 (H) 0.70 - 1.25 mg/dL    Comment:   For patients > or = 65 years of age: The upper reference limit for Creatinine is approximately 13% higher for people identified as African-American.      Total Bilirubin 1.1 0.2 - 1.2 mg/dL   Alkaline Phosphatase 120 (H) 40 - 115 U/L   AST 49 (H) 10 - 35 U/L   ALT 41 9 - 46 U/L   Total Protein 6.7 6.1 - 8.1 g/dL   Albumin 4.2 3.6 - 5.1 g/dL   Calcium 9.2 8.6 - 10.3 mg/dL   GFR, Est African American 69 >=60 mL/min   GFR, Est Non African American 59 (L) >=60 mL/min  Lipid panel     Status: None   Collection Time: 06/26/16  2:03 PM  Result Value Ref Range   Cholesterol 174 125 - 200 mg/dL   Triglycerides 109 <150 mg/dL   HDL 43 >=40 mg/dL   Total CHOL/HDL Ratio 4.0 <=5.0 Ratio   VLDL 22 <30 mg/dL   LDL Cholesterol 109 <130 mg/dL    Comment:   Total Cholesterol/HDL Ratio:CHD Risk                        Coronary Heart Disease Risk Table                                        Men       Women          1/2 Average Risk              3.4        3.3              Average Risk              5.0        4.4           2X Average Risk              9.6        7.1           3X Average Risk             23.4  11.0 Use the calculated Patient Ratio above and the CHD Risk table  to determine the patient's CHD Risk.   Hemoglobin A1c     Status: Abnormal   Collection Time: 06/26/16  2:03 PM  Result Value Ref Range   Hgb A1c MFr Bld 7.5 (H) <5.7 %    Comment:   For someone without known diabetes, a hemoglobin A1c value of 6.5% or greater indicates that they may have diabetes and this should be confirmed with a follow-up test.   For someone with known diabetes, a value <7% indicates that their diabetes is  well controlled and a value greater than or equal to 7% indicates suboptimal control. A1c targets should be individualized based on duration of diabetes, age, comorbid conditions, and other considerations.   Currently, no consensus exists for use of hemoglobin A1c for diagnosis of diabetes for children.      Mean Plasma Glucose 169 mg/dL    Assessment & Plan:   1. Uncontrolled type 2 diabetes mellitus with complication, with long-term current use of insulin ([place])  - his diabetes is  complicated by morbid obesity and sleep apnea and patient remains at a high risk for more acute and chronic complications of diabetes which include CAD, CVA, CKD, retinopathy, and neuropathy. These are all discussed in detail with the patient.  Patient came Improved but still slightly above target glucose profile, and  recent A1c  7.5% improving from  8.2 %.  Glucose logs and insulin administration records pertaining to this visit,  to be scanned into patient's records.  Recent labs reviewed.   - I have re-counseled the patient on diet management and weight loss  by adopting a carbohydrate restricted / protein rich  Diet.  - Suggestion is made for patient to avoid simple carbohydrates   from their diet including Cakes , Desserts, Ice Cream,  Soda (  diet and regular) , Sweet Tea , Candies,  Chips, Cookies, Artificial Sweeteners,   and "Sugar-free" Products .  This will help patient to have stable blood glucose profile and potentially avoid unintended  Weight gain.  - Patient is advised to stick to a routine mealtimes to eat 3 meals  a day and avoid unnecessary snacks ( to snack only to correct hypoglycemia).  - The patient  has been  scheduled with Jearld Fenton, RDN, CDE for individualized DM education.  - I have approached patient with the following individualized plan to manage diabetes and patient agrees.  - Continue basal insulin Levemir  90 units qhs, and   Humalog  20 units TIDAC DAC for pre-meal BG  readings of 90-'150mg'$ /dl, plus patient specific correction dose of rapid acting insulin for unexpected hyperglycemia above '150mg'$ /dl, associated with strict monitoring of BG AC and HS.  -Adjustment parameters for hypo and hyperglycemia were given in a written document to patient. -Patient is encouraged to call clinic for blood glucose levels less than 70 or above 300 mg /dl. -He will continue on Victoza 1.8 mg subcutaneous daily. -I will continue metformin '500mg'$  po BID, did not tolerate Invokana.   - Patient specific target  for A1c; LDL, HDL, Triglycerides, and  Waist Circumference were discussed in detail.  2) BP/HTN : controlled. Continue current medications. 3) Lipids/HPL:  continue statins. 4)  Weight/Diet: CDE consult in progress, exercise, and carbohydrates information provided.  5) Chronic Care/Health Maintenance:  -Patient is on ACEI/ARB and Statin medications and encouraged to continue to follow up with Ophthalmology, Podiatrist at least yearly or according to recommendations,  and advised to  stay away from smoking. I have recommended yearly flu vaccine and pneumonia vaccination at least every 5 years; moderate intensity exercise for up to 150 minutes weekly; and  sleep for at least 7 hours a day.  - 25 minutes of time was spent on the care of this patient , 50% of which was applied for counseling on diabetes complications and their preventions.  - I advised patient to maintain close follow up with [name], MD for primary care needs.  Patient is asked to bring meter and  blood glucose logs during their next visit.   Follow up plan: -Return in about 3 months (around 10/06/2016) for follow up with pre-visit labs, meter, and logs.  Glade Lloyd, MD Phone: [phone]  Fax: [phone]   07/06/2016, 3:57 PM

## 2016-07-06 NOTE — Patient Instructions (Signed)

## 2016-07-07 DIAGNOSIS — G4733 Obstructive sleep apnea (adult) (pediatric): Secondary | ICD-10-CM

## 2016-07-08 ENCOUNTER — Other Ambulatory Visit: Payer: Self-pay | Admitting: *Deleted

## 2016-07-08 ENCOUNTER — Telehealth: Payer: Self-pay | Admitting: Pulmonary Disease

## 2016-07-08 DIAGNOSIS — G4733 Obstructive sleep apnea (adult) (pediatric): Secondary | ICD-10-CM

## 2016-07-08 NOTE — Telephone Encounter (Signed)
Pt returning call

## 2016-07-08 NOTE — Telephone Encounter (Signed)
Patient aware of results.  Copy of Sleep Study results given to [facility] to fax to [facility] for order placed on 06/22/16.  Nothing further needed.

## 2016-07-08 NOTE — Telephone Encounter (Signed)
Per Dr. Elsworth Soho:  Sleep Study shows Severe OSA - 40/hr.  CPAP ordered 06/22/16. --------- Attempted to contact patient, left message for patient to return call.

## 2016-07-10 ENCOUNTER — Telehealth: Payer: Self-pay | Admitting: Pulmonary Disease

## 2016-07-10 MED ORDER — UMECLIDINIUM-VILANTEROL 62.5-25 MCG/INH IN AEPB: 1.0000 | INHALATION_SPRAY | Freq: Every day | RESPIRATORY_TRACT | 3 refills | Status: DC

## 2016-07-10 NOTE — Telephone Encounter (Signed)
I spoke with Kenneth Patrick with [facility] who states it looked like the RX for anoro had been canceled but wasn't sure why and suggested that I resend it, I have resent this RX. Pt aware that 2 samples will be left up front for him to come by and pick up to get him by until his RX arrives. Pt voiced understanding and had no further questions. Nothing further needed.

## 2016-07-19 ENCOUNTER — Ambulatory Visit: Payer: Medicare Other | Attending: Pulmonary Disease | Admitting: Pulmonary Disease

## 2016-07-19 DIAGNOSIS — G4761 Periodic limb movement disorder: Secondary | ICD-10-CM | POA: Insufficient documentation

## 2016-07-19 DIAGNOSIS — G4733 Obstructive sleep apnea (adult) (pediatric): Secondary | ICD-10-CM | POA: Insufficient documentation

## 2016-07-24 ENCOUNTER — Telehealth: Payer: Self-pay | Admitting: Pulmonary Disease

## 2016-07-24 DIAGNOSIS — G4733 Obstructive sleep apnea (adult) (pediatric): Secondary | ICD-10-CM | POA: Diagnosis not present

## 2016-07-24 NOTE — Procedures (Signed)
Patient Name: Kenneth Patrick, Kenneth Patrick Date: 07/19/2016 Gender: Male D.O.B: [birth year] Age (years): 44 Referring Provider: Kara Mead MD, ABSM Height (inches): 76 Interpreting Physician: Kara Mead MD, ABSM Weight (lbs): 372 RPSGT: Rosebud Poles BMI: 45 MRN: [id number] Neck Size: 19.50 CLINICAL INFORMATION The patient is referred for a CPAP titration to treat sleep apnea. Date of HST:07/2016, AHI 40/h   SLEEP STUDY TECHNIQUE As per the AASM Manual for the Scoring of Sleep and Associated Events v2.3 (April 2016) with a hypopnea requiring 4% desaturations. The channels recorded and monitored were frontal, central and occipital EEG, electrooculogram (EOG), submentalis EMG (chin), nasal and oral airflow, thoracic and abdominal wall motion, anterior tibialis EMG, snore microphone, electrocardiogram, and pulse oximetry. Continuous positive airway pressure (CPAP) was initiated at the beginning of the study and titrated to treat sleep-disordered breathing.   TECHNICIAN COMMENTS Comments added by technician: Patient has a glass eye (left). Patient became restless during the later part of study. Optimal pressure was not obtained due to no supine REM sleep was observed. Patient unable to maintain sleep and was agreed to stop study   RESPIRATORY PARAMETERS Optimal PAP Pressure (cm):  AHI at Optimal Pressure (/hr): N/A Overall Minimal O2 (%): 85.00 Supine % at Optimal Pressure (%): N/A Minimal O2 at Optimal Pressure (%): 85.00     SLEEP ARCHITECTURE The study was initiated at 10:22:22 PM and ended at 5:02:45 AM. Sleep onset time was 20.3 minutes and the sleep efficiency was 59.4%. The total sleep time was 238.0 minutes. The patient spent 19.54% of the night in stage N1 sleep, 57.77% in stage N2 sleep, 19.96% in stage N3 and 2.73% in REM.Stage REM latency was 181.5 minutes Wake after sleep onset was 142.1. Alpha intrusion was absent. Supine sleep was 0.00%.    CARDIAC DATA The 2 lead EKG  demonstrated sinus rhythm. The mean heart rate was N/A beats per minute. Other EKG findings include: None.   LEG MOVEMENT DATA The total Periodic Limb Movements of Sleep (PLMS) were 272. The PLMS index was 68.57. A PLMS index of <15 is considered normal in adults.    IMPRESSIONS - An optimal PAP pressure could not be selected for this patient based on the available study data.few hypopneas persisted on CPAP 10 cm. He had difficulty sleeping after this level. - Central sleep apnea was not noted during this titration (CAI = 0.0/h). - Moderate oxygen desaturations were observed during this titration (min O2 = 85.00%). - The patient snored with Soft snoring volume during this titration study. - No cardiac abnormalities were observed during this study. - Severe periodic limb movements were observed during this study. Arousals associated with PLMs were significant.    DIAGNOSIS - Obstructive Sleep Apnea (327.23 [G47.33 ICD-10]) - PLM disorder    RECOMMENDATIONS - Recommend a trial of Auto-CPAP 10-15 cm H2O.  - Avoid alcohol, sedatives and other CNS depressants that may worsen sleep apnea and disrupt normal sleep architecture. - Sleep hygiene should be reviewed to assess factors that may improve sleep quality. - Weight management and regular exercise should be initiated or continued. - Return to [facility] for re-evaluation after 4 weeks of therapy    Kara Mead MD Board Certified in [place]

## 2016-07-24 NOTE — Telephone Encounter (Signed)
Based on titration study, pl send Rx for autoCPAP 10-15 cm to DME OV in 6 wks with download

## 2016-08-03 NOTE — Telephone Encounter (Signed)
Spoke with pt and advised of cpap titration results per Dr Elsworth Soho. Order placed and ov rescheduled with TP.  Nothing further needed.

## 2016-08-09 ENCOUNTER — Other Ambulatory Visit: Payer: Self-pay | Admitting: "Endocrinology

## 2016-08-17 ENCOUNTER — Ambulatory Visit: Payer: 59 | Admitting: Adult Health

## 2016-08-17 DIAGNOSIS — Z125 Encounter for screening for malignant neoplasm of prostate: Secondary | ICD-10-CM | POA: Diagnosis not present

## 2016-08-17 DIAGNOSIS — Z1389 Encounter for screening for other disorder: Secondary | ICD-10-CM | POA: Diagnosis not present

## 2016-08-17 DIAGNOSIS — Z7189 Other specified counseling: Secondary | ICD-10-CM | POA: Diagnosis not present

## 2016-08-17 DIAGNOSIS — Z299 Encounter for prophylactic measures, unspecified: Secondary | ICD-10-CM | POA: Diagnosis not present

## 2016-08-17 DIAGNOSIS — Z79899 Other long term (current) drug therapy: Secondary | ICD-10-CM | POA: Diagnosis not present

## 2016-08-17 DIAGNOSIS — E7800 Pure hypercholesterolemia, unspecified: Secondary | ICD-10-CM | POA: Diagnosis not present

## 2016-08-17 DIAGNOSIS — R5383 Other fatigue: Secondary | ICD-10-CM | POA: Diagnosis not present

## 2016-08-17 DIAGNOSIS — Z0000 Encounter for general adult medical examination without abnormal findings: Secondary | ICD-10-CM | POA: Diagnosis not present

## 2016-08-17 DIAGNOSIS — Z6841 Body Mass Index (BMI) 40.0 and over, adult: Secondary | ICD-10-CM | POA: Diagnosis not present

## 2016-08-17 DIAGNOSIS — Z1211 Encounter for screening for malignant neoplasm of colon: Secondary | ICD-10-CM | POA: Diagnosis not present

## 2016-08-26 DIAGNOSIS — J340 Abscess, furuncle and carbuncle of nose: Secondary | ICD-10-CM | POA: Diagnosis not present

## 2016-08-26 DIAGNOSIS — J449 Chronic obstructive pulmonary disease, unspecified: Secondary | ICD-10-CM | POA: Diagnosis not present

## 2016-08-26 DIAGNOSIS — E119 Type 2 diabetes mellitus without complications: Secondary | ICD-10-CM | POA: Diagnosis not present

## 2016-08-26 DIAGNOSIS — Z299 Encounter for prophylactic measures, unspecified: Secondary | ICD-10-CM | POA: Diagnosis not present

## 2016-09-03 ENCOUNTER — Encounter: Payer: Self-pay | Admitting: Pulmonary Disease

## 2016-09-07 ENCOUNTER — Ambulatory Visit (INDEPENDENT_AMBULATORY_CARE_PROVIDER_SITE_OTHER): Payer: Medicare Other | Admitting: Adult Health

## 2016-09-07 ENCOUNTER — Encounter: Payer: Self-pay | Admitting: Adult Health

## 2016-09-07 DIAGNOSIS — G4733 Obstructive sleep apnea (adult) (pediatric): Secondary | ICD-10-CM

## 2016-09-07 DIAGNOSIS — J449 Chronic obstructive pulmonary disease, unspecified: Secondary | ICD-10-CM | POA: Diagnosis not present

## 2016-09-07 MED ORDER — FLUTICASONE FUROATE-VILANTEROL 100-25 MCG/INH IN AEPB: 1.0000 | INHALATION_SPRAY | Freq: Every day | RESPIRATORY_TRACT | 4 refills | Status: DC

## 2016-09-07 NOTE — Progress Notes (Signed)
Subjective:    Patient ID: Kenneth Patrick, male    DOB: 04-Jan-1951, 65 y.o.   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  Told 'asthma' many years ago, has used inhalers & diskus c/o wheezing on perfumes, cooking  He smoked > 60 Pyrs before quitting in 2006.  Gets CPAP supplies from online website  On [place] for DM    Significant tests/ events  Spirometry 2011-  severe airway obstruction with a low ratio & FEV1 of 25 %, good effort !   Meds -12/2012 -Added spiriva - self dc'd -Does not feel he needs it, thought it caused leg swelling. 06/2013 -Breo in place of Symbicort   Admitted Feb 2015 -for acute hypoxic respiratory failure and AKI/metabolic acidosis in the setting of RSV bronchitis.  CT chest showed bilateral lower lobe bronchiectasis.  2-D echo showed grade 1 diastolic dysfunction with preserved EF.  Had a low titer c-ANCA , Positive  Seen by ID , HIV neg.  Histoplasma ag positive in urine , but serology neg . (lived in [place] 10 yrs ago) HST 07/2016 showed severe OSA AHI 40/h   09/07/2016 Follow up : OSA and COPD  Pt returns for 6 month follow up  Says he is doing well on CPAP .  Uses CPAP on average 8-10 hours nightly, mask fits fine. Recently got new CPAP machine .  Download shows execellent compliance with avg usage at 7.5 hr , AHI at 0.8 on set pressure at 14cm.  Feels rested.  Pt has no new complaints at this time.    Was started on ANORO last ov but does feel as well on it. Wants to go back on BREO . Felt it controlled better.  Denies chest pain, orthopnea, or edema Flu , PVX , and Prevnar utd. .   Past Medical History:  Diagnosis Date  . Asthma   . Depression   . Diabetic neuropathy ([place])   . DM2 (diabetes mellitus, type 2) ([place])   . Dyspnea   . Hypercholesterolemia   . Low back pain   . Respiratory infection   . Sleep apnea    Current Outpatient Prescriptions on File  Prior to Visit  Medication Sig Dispense Refill  . albuterol (VENTOLIN HFA) 108 (90 BASE) MCG/ACT inhaler Inhale 2 puffs into the lungs every 6 (six) hours as needed.      [name] atorvastatin (LIPITOR) 80 MG tablet Take 80 mg by mouth At bedtime.     . furosemide (LASIX) 40 MG tablet Take 40 mg by mouth daily as needed for fluid.     [name] gabapentin (NEURONTIN) 300 MG capsule Take 300 mg by mouth 2 (two) times daily.     [name] HUMALOG 100 UNIT/ML injection INJECT SUBCUTANEOUSLY 20-26 UNITS TOTAL 3 TIMES DAILY  BEFORE MEALS 80 mL 0  . LEVEMIR 100 UNIT/ML injection INJECT 0.9 MLS (90 UNITS  TOTAL) INTO THE SKIN AT  BEDTIME. 80 mL 0  . metFORMIN (GLUCOPHAGE) 500 MG tablet Take 1 tablet by mouth 2 (two) times daily.    Glory Rosebush VERIO test strip TEST AS DIRECTED 4 TIMES  DAILY 400 each 1  . oxymetazoline (AFRIN) 0.05 % nasal spray Place 1-2 sprays into the nose 2 (two) times daily as needed.     . sertraline (ZOLOFT) 100 MG tablet Take 200 mg by mouth daily.     [name]  VICTOZA 18 MG/3ML SOLN Inject 1.8 Units into the skin daily.      No current facility-administered medications on file prior to visit.       Review of Systems neg for any significant sore throat, dysphagia, itching, sneezing, nasal congestion or excess/ purulent secretions, fever, chills, sweats, unintended wt loss, pleuritic or exertional cp, hempoptysis, orthopnea pnd or change in chronic leg swelling. Also denies presyncope, palpitations, heartburn, abdominal pain, nausea, vomiting, diarrhea or change in bowel or urinary habits, dysuria,hematuria, rash, arthralgias, visual complaints, headache, numbness weakness or ataxia.     Objective:   Physical Exam Vitals:   09/07/16 1519  BP: 138/68  Pulse: 96  Temp: 97.8 F (36.6 C)  TempSrc: Oral  SpO2: 93%  Weight: (!) 379 lb 3.2 oz (172 kg)  Height: '6\' 4"'$  (1.93 m)   Body mass index is 46.16 kg/m.   Gen. Pleasant, obese, in no distress ENT - no lesions, no post nasal drip., class 2-3 MP  airway  Neck: No JVD, no thyromegaly, no carotid bruits Lungs: no use of accessory muscles, no dullness to percussion, decreased without rales or rhonchi  Cardiovascular: Rhythm regular, heart sounds  normal, no murmurs or gallops, no peripheral edema Musculoskeletal: No deformities, no cyanosis or clubbing , no tremors  Rayetta Veith NP-C  [facility]  09/07/2016       Assessment & Plan:

## 2016-09-07 NOTE — Patient Instructions (Addendum)
Continue on  CPAP At bedtime   Work on Weight loss as discussed.  Stop ANORO , restart BREO daily  Follow up Dr. Elsworth Soho  In 6 months   Please contact office for sooner follow up if symptoms do not improve or worsen or seek emergency care

## 2016-09-07 NOTE — Assessment & Plan Note (Addendum)
Restart BREO in place of [place] daily

## 2016-09-07 NOTE — Assessment & Plan Note (Signed)
Cont on CPAP At bedtime  

## 2016-09-07 NOTE — Addendum Note (Signed)
Addended by: Valerie Salts on: 09/07/2016 04:40 PM   Modules accepted: Orders

## 2016-09-24 ENCOUNTER — Other Ambulatory Visit: Payer: Self-pay

## 2016-09-24 ENCOUNTER — Telehealth: Payer: Self-pay | Admitting: "Endocrinology

## 2016-09-24 MED ORDER — INSULIN DETEMIR 100 UNIT/ML ~~LOC~~ SOLN: 90.0000 [IU] | Freq: Every day | SUBCUTANEOUS | 0 refills | Status: DC

## 2016-09-24 NOTE — Telephone Encounter (Signed)
Patient [facility] requesting refill for Levemir. [facility] has tried to get in touch with [place].  Please call patient's wife back.

## 2016-09-29 ENCOUNTER — Encounter: Payer: Self-pay | Admitting: Pulmonary Disease

## 2016-09-30 ENCOUNTER — Other Ambulatory Visit: Payer: Self-pay | Admitting: "Endocrinology

## 2016-09-30 DIAGNOSIS — E1165 Type 2 diabetes mellitus with hyperglycemia: Secondary | ICD-10-CM | POA: Diagnosis not present

## 2016-09-30 DIAGNOSIS — Z794 Long term (current) use of insulin: Secondary | ICD-10-CM | POA: Diagnosis not present

## 2016-09-30 DIAGNOSIS — E118 Type 2 diabetes mellitus with unspecified complications: Secondary | ICD-10-CM | POA: Diagnosis not present

## 2016-10-01 LAB — COMPREHENSIVE METABOLIC PANEL
A/G RATIO: 1.8 (ref 1.2–2.2)
ALBUMIN: 4.4 g/dL (ref 3.6–4.8)
ALK PHOS: 144 IU/L — AB (ref 39–117)
ALT: 49 IU/L — AB (ref 0–44)
AST: 78 IU/L — ABNORMAL HIGH (ref 0–40)
BILIRUBIN TOTAL: 0.9 mg/dL (ref 0.0–1.2)
BUN / CREAT RATIO: 18 (ref 10–24)
BUN: 21 mg/dL (ref 8–27)
CHLORIDE: 104 mmol/L (ref 96–106)
CO2: 24 mmol/L (ref 18–29)
Calcium: 10.6 mg/dL — ABNORMAL HIGH (ref 8.6–10.2)
Creatinine, Ser: 1.16 mg/dL (ref 0.76–1.27)
GFR calc non Af Amer: 66 mL/min/{1.73_m2} (ref >59)
GFR, EST AFRICAN AMERICAN: 76 mL/min/{1.73_m2} (ref >59)
GLUCOSE: 106 mg/dL — AB (ref 65–99)
Globulin, Total: 2.4 g/dL (ref 1.5–4.5)
POTASSIUM: 5.1 mmol/L (ref 3.5–5.2)
Sodium: 143 mmol/L (ref 134–144)
Total Protein: 6.8 g/dL (ref 6.0–8.5)

## 2016-10-01 LAB — HGB A1C W/O EAG: HEMOGLOBIN A1C: 7.7 % — AB (ref 4.8–5.6)

## 2016-10-11 ENCOUNTER — Other Ambulatory Visit: Payer: Self-pay | Admitting: "Endocrinology

## 2016-10-12 ENCOUNTER — Ambulatory Visit (INDEPENDENT_AMBULATORY_CARE_PROVIDER_SITE_OTHER): Payer: Medicare Other | Admitting: "Endocrinology

## 2016-10-12 ENCOUNTER — Telehealth: Payer: Self-pay | Admitting: Pulmonary Disease

## 2016-10-12 ENCOUNTER — Encounter: Payer: Self-pay | Admitting: "Endocrinology

## 2016-10-12 ENCOUNTER — Other Ambulatory Visit: Payer: Self-pay

## 2016-10-12 VITALS — BP 141/87 | HR 87 | Ht 76.0 in | Wt 375.0 lb

## 2016-10-12 DIAGNOSIS — IMO0002 Reserved for concepts with insufficient information to code with codable children: Secondary | ICD-10-CM

## 2016-10-12 DIAGNOSIS — I10 Essential (primary) hypertension: Secondary | ICD-10-CM

## 2016-10-12 DIAGNOSIS — Z794 Long term (current) use of insulin: Secondary | ICD-10-CM | POA: Diagnosis not present

## 2016-10-12 DIAGNOSIS — E1165 Type 2 diabetes mellitus with hyperglycemia: Secondary | ICD-10-CM | POA: Diagnosis not present

## 2016-10-12 DIAGNOSIS — E118 Type 2 diabetes mellitus with unspecified complications: Secondary | ICD-10-CM

## 2016-10-12 DIAGNOSIS — E782 Mixed hyperlipidemia: Secondary | ICD-10-CM | POA: Diagnosis not present

## 2016-10-12 MED ORDER — FLUTICASONE FUROATE-VILANTEROL 100-25 MCG/INH IN AEPB: 1.0000 | INHALATION_SPRAY | Freq: Every day | RESPIRATORY_TRACT | 4 refills | Status: DC

## 2016-10-12 MED ORDER — INSULIN LISPRO 100 UNIT/ML ~~LOC~~ SOLN: SUBCUTANEOUS | 0 refills | Status: DC

## 2016-10-12 MED ORDER — BASAGLAR KWIKPEN 100 UNIT/ML ~~LOC~~ SOPN: 90.0000 [IU] | PEN_INJECTOR | Freq: Every day | SUBCUTANEOUS | 0 refills | Status: DC

## 2016-10-12 NOTE — Telephone Encounter (Signed)
Spoke with patient regarding his Breo refill. Informed him that I was sending in the RX to [facility]. Pt. Had no further questions.

## 2016-10-12 NOTE — Telephone Encounter (Signed)
Called into the correct pharmacy

## 2016-10-12 NOTE — Patient Instructions (Signed)

## 2016-10-12 NOTE — Telephone Encounter (Signed)
Called patient back to inform him that I was sending in the RX for his Breo. Patient was very happy and had no further questions.

## 2016-10-12 NOTE — Progress Notes (Signed)
Subjective:    Patient ID: Kenneth Patrick, male    DOB: [birth year], PCP [name], MD   Past Medical History:  Diagnosis Date  . Asthma   . Depression   . Diabetic neuropathy ([place])   . DM2 (diabetes mellitus, type 2) ([place])   . Dyspnea   . Hypercholesterolemia   . Low back pain   . Respiratory infection   . Sleep apnea    Past Surgical History:  Procedure Laterality Date  . CHOLECYSTECTOMY    . left eye enucleation following trauma     Social History   Social History  . Marital status: Married    Spouse name: N/A  . Number of children: N/A  . Years of education: N/A   Occupational History  . retired Engineer, structural    Social History Main Topics  . Smoking status: Former Smoker    Packs/day: 1.50    Years: 20.00    Quit date: 10/05/2004  . Smokeless tobacco: Never Used  . Alcohol use No  . Drug use: No     Comment: stopped smoking marijuana in 2006  . Sexual activity: Not Asked   Other Topics Concern  . None   Social History Narrative  . None   Outpatient Encounter Prescriptions as of 10/12/2016  Medication Sig  . albuterol (VENTOLIN HFA) 108 (90 BASE) MCG/ACT inhaler Inhale 2 puffs into the lungs every 6 (six) hours as needed.    [name] atorvastatin (LIPITOR) 80 MG tablet Take 80 mg by mouth At bedtime.   . fluticasone furoate-vilanterol (BREO ELLIPTA) 100-25 MCG/INH AEPB Inhale 1 puff into the lungs daily.  . furosemide (LASIX) 40 MG tablet Take 40 mg by mouth daily as needed for fluid.   [name] gabapentin (NEURONTIN) 300 MG capsule Take 300 mg by mouth 2 (two) times daily.   . Insulin Glargine (BASAGLAR KWIKPEN) 100 UNIT/ML SOPN Inject 0.9 mLs (90 Units total) into the skin at bedtime.  . insulin lispro (HUMALOG) 100 UNIT/ML injection Use up to 78 units daily  . metFORMIN (GLUCOPHAGE) 500 MG tablet Take 1 tablet by mouth 2 (two) times daily.  Glory Rosebush VERIO test strip TEST AS DIRECTED 4 TIMES  DAILY  . oxymetazoline (AFRIN) 0.05 % nasal spray Place 1-2 sprays  into the nose 2 (two) times daily as needed.   . sertraline (ZOLOFT) 100 MG tablet Take 200 mg by mouth daily.   [name] VICTOZA 18 MG/3ML SOLN Inject 1.8 Units into the skin daily.   . [DISCONTINUED] HUMALOG 100 UNIT/ML injection INJECT SUBCUTANEOUSLY 20-26 UNITS TOTAL 3 TIMES DAILY  BEFORE MEALS  . [DISCONTINUED] insulin detemir (LEVEMIR) 100 UNIT/ML injection Inject 0.9 mLs (90 Units total) into the skin at bedtime.   No facility-administered encounter medications on file as of 10/12/2016.    ALLERGIES: Allergies  Allergen Reactions  . Penicillins     Childhood allergy    VACCINATION STATUS: Immunization History  Administered Date(s) Administered  . Influenza Split 10/23/2011  . Influenza Whole 09/04/2010  . Influenza,inj,Quad PF,36+ Mos 06/08/2013, 09/03/2014, 07/31/2015, 06/22/2016  . Pneumococcal Conjugate-13 03/05/2015  . Pneumococcal Polysaccharide-23 05/14/2011, 11/27/2013    Diabetes  He presents for his follow-up diabetic visit. He has type 2 diabetes mellitus. Onset time: He was diagnosed at approximate age of 42 years. His disease course has been improving. There are no hypoglycemic associated symptoms. Pertinent negatives for hypoglycemia include no confusion, headaches, pallor or seizures. There are no diabetic associated symptoms. Pertinent negatives for diabetes include  no chest pain, no fatigue, no polydipsia, no polyphagia, no polyuria and no weakness. There are no hypoglycemic complications. Symptoms are improving. There are no diabetic complications. Risk factors for coronary artery disease include diabetes mellitus, dyslipidemia, hypertension, male sex, obesity, sedentary lifestyle and tobacco exposure. Current diabetic treatment includes insulin injections. He is compliant with treatment some of the time. His weight is increasing steadily. He is following a generally unhealthy diet. When asked about meal planning, he reported none. He has had a previous visit with a  dietitian. He never participates in exercise. His home blood glucose trend is increasing steadily. His breakfast blood glucose range is generally 140-180 mg/dl. His lunch blood glucose range is generally 140-180 mg/dl. His dinner blood glucose range is generally 140-180 mg/dl. His overall blood glucose range is 140-180 mg/dl. (He did not bring his logs, his meter shows average blood glucose of 200-216.) Eye exam is current.  Hyperlipidemia  This is a chronic problem. The current episode started more than 1 year ago. Pertinent negatives include no chest pain, myalgias or shortness of breath. Current antihyperlipidemic treatment includes statins. Compliance problems include adherence to diet and adherence to exercise.  Risk factors for coronary artery disease include dyslipidemia, diabetes mellitus, obesity, male sex and a sedentary lifestyle.  Hypertension  This is a chronic problem. The current episode started more than 1 year ago. Pertinent negatives include no chest pain, headaches, neck pain, palpitations or shortness of breath. Risk factors for coronary artery disease include diabetes mellitus, dyslipidemia, male gender, obesity, smoking/tobacco exposure and sedentary lifestyle. Compliance problems include diet and exercise.      Review of Systems  Constitutional: Negative for chills, fatigue, fever and unexpected weight change.  HENT: Negative for dental problem, mouth sores and trouble swallowing.   Eyes: Negative for visual disturbance.  Respiratory: Negative for cough, choking, chest tightness, shortness of breath and wheezing.   Cardiovascular: Negative for chest pain, palpitations and leg swelling.  Gastrointestinal: Negative for abdominal distention, abdominal pain, constipation, diarrhea, nausea and vomiting.  Endocrine: Negative for polydipsia, polyphagia and polyuria.  Genitourinary: Negative for dysuria, flank pain, hematuria and urgency.  Musculoskeletal: Negative for back pain, gait  problem, myalgias and neck pain.  Skin: Negative for pallor, rash and wound.  Neurological: Negative for seizures, syncope, weakness, numbness and headaches.  Psychiatric/Behavioral: Negative.  Negative for confusion and dysphoric mood.    Objective:    BP (!) 141/87   Pulse 87   Ht '6\' 4"'$  (1.93 m)   Wt (!) 375 lb (170.1 kg)   BMI 45.65 kg/m   Wt Readings from Last 3 Encounters:  10/12/16 (!) 375 lb (170.1 kg)  09/07/16 (!) 379 lb 3.2 oz (172 kg)  07/06/16 (!) 372 lb (168.7 kg)    Physical Exam  Constitutional: He is oriented to person, place, and time. He appears well-developed and well-nourished. He is cooperative. No distress.  HENT:  Head: Normocephalic and atraumatic.  Neck: Normal range of motion. Neck supple. No tracheal deviation present. No thyromegaly present.  Cardiovascular: Normal rate, S1 normal, S2 normal and normal heart sounds.  Exam reveals no gallop.   No murmur heard. Pulses:      Dorsalis pedis pulses are 1+ on the right side, and 1+ on the left side.       Posterior tibial pulses are 1+ on the right side, and 1+ on the left side.  Pulmonary/Chest: Breath sounds normal. No respiratory distress. He has no wheezes.  Abdominal: Soft. Bowel sounds are  normal. He exhibits no distension. There is no tenderness. There is no guarding and no CVA tenderness.  Musculoskeletal: He exhibits no edema.       Right shoulder: He exhibits no swelling and no deformity.  Neurological: He is alert and oriented to person, place, and time. He has normal strength and normal reflexes. No cranial nerve deficit or sensory deficit. Gait normal.  Skin: Skin is warm and dry. No rash noted. No cyanosis. Nails show no clubbing.  Psychiatric: He has a normal mood and affect. His speech is normal and behavior is normal. Judgment and thought content normal. Cognition and memory are normal.       Diabetic Labs (most recent): Lab Results  Component Value Date   HGBA1C 7.7 (H) 09/30/2016    HGBA1C 7.5 (H) 06/26/2016   HGBA1C 7.6 (H) 03/26/2016   Recent Results (from the past 2160 hour(s))  Comprehensive metabolic panel     Status: Abnormal   Collection Time: 09/30/16 11:52 AM  Result Value Ref Range   Glucose 106 (H) 65 - 99 mg/dL   BUN 21 8 - 27 mg/dL   Creatinine, Ser 1.16 0.76 - 1.27 mg/dL   GFR calc non Af Amer 66 >59 mL/min/1.73   GFR calc Af Amer 76 >59 mL/min/1.73   BUN/Creatinine Ratio 18 10 - 24   Sodium 143 134 - 144 mmol/L   Potassium 5.1 3.5 - 5.2 mmol/L   Chloride 104 96 - 106 mmol/L   CO2 24 18 - 29 mmol/L   Calcium 10.6 (H) 8.6 - 10.2 mg/dL   Total Protein 6.8 6.0 - 8.5 g/dL   Albumin 4.4 3.6 - 4.8 g/dL   Globulin, Total 2.4 1.5 - 4.5 g/dL   Albumin/Globulin Ratio 1.8 1.2 - 2.2   Bilirubin Total 0.9 0.0 - 1.2 mg/dL   Alkaline Phosphatase 144 (H) 39 - 117 IU/L   AST 78 (H) 0 - 40 IU/L   ALT 49 (H) 0 - 44 IU/L  Hgb A1c w/o eAG     Status: Abnormal   Collection Time: 09/30/16 11:52 AM  Result Value Ref Range   Hgb A1c MFr Bld 7.7 (H) 4.8 - 5.6 %    Comment:          Pre-diabetes: 5.7 - 6.4          Diabetes: >6.4          Glycemic control for adults with diabetes: <7.0     Assessment & Plan:   1. Uncontrolled type 2 diabetes mellitus with complication, with long-term current use of insulin ([place])  - his diabetes is  complicated by morbid obesity and sleep apnea and patient remains at a high risk for more acute and chronic complications of diabetes which include CAD, CVA, CKD, retinopathy, and neuropathy. These are all discussed in detail with the patient.  Patient came Improved but still slightly above target glucose profile, and  recent A1c  7.7% staying stable from last visit.  -  Glucose logs and insulin administration records pertaining to this visit,  to be scanned into patient's records.  Recent labs reviewed.   - I have re-counseled the patient on diet management and weight loss  by adopting a carbohydrate restricted / protein rich   Diet.  - Suggestion is made for patient to avoid simple carbohydrates   from their diet including Cakes , Desserts, Ice Cream,  Soda (  diet and regular) , Sweet Tea , Candies,  Chips, Cookies, Artificial Sweeteners,  and "Sugar-free" Products .  This will help patient to have stable blood glucose profile and potentially avoid unintended  Weight gain.  - Patient is advised to stick to a routine mealtimes to eat 3 meals  a day and avoid unnecessary snacks ( to snack only to correct hypoglycemia).  - The patient  has been  scheduled with Jearld Fenton, RDN, CDE for individualized DM education.  - I have approached patient with the following individualized plan to manage diabetes and patient agrees.  - Continue basal insulin Levemir  ( his insurance is switching to [employer] ) 90 units qhs, and  continue Humalog  20 units TIDAC DAC for pre-meal BG readings of 90-'150mg'$ /dl, plus patient specific correction dose of rapid acting insulin for unexpected hyperglycemia above '150mg'$ /dl, associated with strict monitoring of BG AC and HS.  -Adjustment parameters for hypo and hyperglycemia were given in a written document to patient. -Patient is encouraged to call clinic for blood glucose levels less than 70 or above 300 mg /dl. -He will continue on Victoza 1.8 mg subcutaneous daily. -I will continue metformin '500mg'$  po BID, did not tolerate Invokana.   - Patient specific target  for A1c; LDL, HDL, Triglycerides, and  Waist Circumference were discussed in detail.  2) BP/HTN : controlled. Continue current medications. 3) Lipids/HPL:  continue statins. 4)  Weight/Diet: CDE consult in progress, exercise, and carbohydrates information provided.  5) Chronic Care/Health Maintenance:  -Patient is on ACEI/ARB and Statin medications and encouraged to continue to follow up with Ophthalmology, Podiatrist at least yearly or according to recommendations, and advised to  stay away from smoking. I have recommended yearly  flu vaccine and pneumonia vaccination at least every 5 years; moderate intensity exercise for up to 150 minutes weekly; and  sleep for at least 7 hours a day.  - 25 minutes of time was spent on the care of this patient , 50% of which was applied for counseling on diabetes complications and their preventions.  - I advised patient to maintain close follow up with [name], MD for primary care needs.  Patient is asked to bring meter and  blood glucose logs during their next visit.   Follow up plan: -Return in about 3 months (around 01/10/2017) for follow up with pre-visit labs, meter, and logs.  Glade Lloyd, MD Phone: [phone]  Fax: [phone]   10/12/2016, 2:44 PM

## 2016-10-14 ENCOUNTER — Telehealth: Payer: Self-pay | Admitting: Pulmonary Disease

## 2016-10-14 NOTE — Telephone Encounter (Signed)
Dr. Elsworth Soho received pt's CPAP download. Stated that the machine was working well and that the only change needed was the pressure which will decreased down to 14cm. Pt. Has excellent usage.   Spoke with patient regarding this. Patient verbalized understanding and had no further questions.

## 2016-12-31 ENCOUNTER — Other Ambulatory Visit: Payer: Self-pay | Admitting: "Endocrinology

## 2017-01-05 ENCOUNTER — Other Ambulatory Visit: Payer: Self-pay | Admitting: "Endocrinology

## 2017-01-06 DIAGNOSIS — Z794 Long term (current) use of insulin: Secondary | ICD-10-CM | POA: Diagnosis not present

## 2017-01-06 DIAGNOSIS — E118 Type 2 diabetes mellitus with unspecified complications: Secondary | ICD-10-CM | POA: Diagnosis not present

## 2017-01-06 DIAGNOSIS — E1165 Type 2 diabetes mellitus with hyperglycemia: Secondary | ICD-10-CM | POA: Diagnosis not present

## 2017-01-07 LAB — CMP14+EGFR
A/G RATIO: 1.6 (ref 1.2–2.2)
ALBUMIN: 4.1 g/dL (ref 3.6–4.8)
ALK PHOS: 120 IU/L — AB (ref 39–117)
ALT: 27 IU/L (ref 0–44)
AST: 33 IU/L (ref 0–40)
BUN / CREAT RATIO: 16 (ref 10–24)
BUN: 19 mg/dL (ref 8–27)
Bilirubin Total: 0.9 mg/dL (ref 0.0–1.2)
CO2: 22 mmol/L (ref 18–29)
Calcium: 8.8 mg/dL (ref 8.6–10.2)
Chloride: 100 mmol/L (ref 96–106)
Creatinine, Ser: 1.18 mg/dL (ref 0.76–1.27)
GFR calc Af Amer: 74 mL/min/{1.73_m2} (ref >59)
GFR calc non Af Amer: 64 mL/min/{1.73_m2} (ref >59)
GLOBULIN, TOTAL: 2.5 g/dL (ref 1.5–4.5)
Glucose: 192 mg/dL — ABNORMAL HIGH (ref 65–99)
POTASSIUM: 4.5 mmol/L (ref 3.5–5.2)
SODIUM: 139 mmol/L (ref 134–144)
Total Protein: 6.6 g/dL (ref 6.0–8.5)

## 2017-01-07 LAB — HEMOGLOBIN A1C
Est. average glucose Bld gHb Est-mCnc: 183 mg/dL
HEMOGLOBIN A1C: 8 % — AB (ref 4.8–5.6)

## 2017-01-14 ENCOUNTER — Ambulatory Visit (INDEPENDENT_AMBULATORY_CARE_PROVIDER_SITE_OTHER): Payer: Medicare Other | Admitting: "Endocrinology

## 2017-01-14 ENCOUNTER — Encounter: Payer: Self-pay | Admitting: "Endocrinology

## 2017-01-14 VITALS — BP 137/72 | HR 78 | Ht 76.0 in | Wt 379.0 lb

## 2017-01-14 DIAGNOSIS — Z794 Long term (current) use of insulin: Secondary | ICD-10-CM | POA: Diagnosis not present

## 2017-01-14 DIAGNOSIS — E1165 Type 2 diabetes mellitus with hyperglycemia: Secondary | ICD-10-CM

## 2017-01-14 DIAGNOSIS — I10 Essential (primary) hypertension: Secondary | ICD-10-CM

## 2017-01-14 DIAGNOSIS — IMO0002 Reserved for concepts with insufficient information to code with codable children: Secondary | ICD-10-CM

## 2017-01-14 DIAGNOSIS — E782 Mixed hyperlipidemia: Secondary | ICD-10-CM

## 2017-01-14 DIAGNOSIS — E118 Type 2 diabetes mellitus with unspecified complications: Secondary | ICD-10-CM | POA: Diagnosis not present

## 2017-01-14 MED ORDER — BASAGLAR KWIKPEN 100 UNIT/ML ~~LOC~~ SOPN: PEN_INJECTOR | SUBCUTANEOUS | 0 refills | Status: DC

## 2017-01-14 MED ORDER — INSULIN LISPRO 100 UNIT/ML ~~LOC~~ SOLN: SUBCUTANEOUS | 0 refills | Status: DC

## 2017-01-14 NOTE — Progress Notes (Signed)
Subjective:    Patient ID: Kenneth Patrick, male    DOB: 02/10/1951, PCP [name], MD   Past Medical History:  Diagnosis Date  . Asthma   . Depression   . Diabetic neuropathy ([place])   . DM2 (diabetes mellitus, type 2) ([place])   . Dyspnea   . Hypercholesterolemia   . Low back pain   . Respiratory infection   . Sleep apnea    Past Surgical History:  Procedure Laterality Date  . CHOLECYSTECTOMY    . left eye enucleation following trauma     Social History   Social History  . Marital status: Married    Spouse name: N/A  . Number of children: N/A  . Years of education: N/A   Occupational History  . retired Engineer, structural    Social History Main Topics  . Smoking status: Former Smoker    Packs/day: 1.50    Years: 20.00    Quit date: 10/05/2004  . Smokeless tobacco: Never Used  . Alcohol use No  . Drug use: No     Comment: stopped smoking marijuana in 2006  . Sexual activity: Not Asked   Other Topics Concern  . None   Social History Narrative  . None   Outpatient Encounter Prescriptions as of 01/14/2017  Medication Sig  . albuterol (VENTOLIN HFA) 108 (90 BASE) MCG/ACT inhaler Inhale 2 puffs into the lungs every 6 (six) hours as needed.    [name] atorvastatin (LIPITOR) 80 MG tablet Take 80 mg by mouth At bedtime.   . fluticasone furoate-vilanterol (BREO ELLIPTA) 100-25 MCG/INH AEPB Inhale 1 puff into the lungs daily.  . furosemide (LASIX) 40 MG tablet Take 40 mg by mouth daily as needed for fluid.   [name] gabapentin (NEURONTIN) 300 MG capsule Take 300 mg by mouth 2 (two) times daily.   . Insulin Glargine (BASAGLAR KWIKPEN) 100 UNIT/ML SOPN INJECT SUBCUTANEOUSLY 90  UNITS AT BEDTIME  . insulin lispro (HUMALOG) 100 UNIT/ML injection Use up to 78 units daily  . metFORMIN (GLUCOPHAGE) 500 MG tablet Take 1 tablet by mouth 2 (two) times daily.  Glory Rosebush VERIO test strip TEST AS DIRECTED 4 TIMES  DAILY  . oxymetazoline (AFRIN) 0.05 % nasal spray Place 1-2 sprays into the nose 2  (two) times daily as needed.   . sertraline (ZOLOFT) 100 MG tablet Take 200 mg by mouth daily.   [name] VICTOZA 18 MG/3ML SOLN Inject 1.8 Units into the skin daily.   . [DISCONTINUED] Insulin Glargine (BASAGLAR KWIKPEN) 100 UNIT/ML SOPN INJECT SUBCUTANEOUSLY 90  UNITS AT BEDTIME  . [DISCONTINUED] insulin lispro (HUMALOG) 100 UNIT/ML injection Use up to 78 units daily   No facility-administered encounter medications on file as of 01/14/2017.    ALLERGIES: Allergies  Allergen Reactions  . Penicillins     Childhood allergy    VACCINATION STATUS: Immunization History  Administered Date(s) Administered  . Influenza Split 10/23/2011  . Influenza Whole 09/04/2010  . Influenza,inj,Quad PF,36+ Mos 06/08/2013, 09/03/2014, 07/31/2015, 06/22/2016  . Pneumococcal Conjugate-13 03/05/2015  . Pneumococcal Polysaccharide-23 05/14/2011, 11/27/2013    Diabetes  He presents for his follow-up diabetic visit. He has type 2 diabetes mellitus. Onset time: He was diagnosed at approximate age of 54 years. His disease course has been worsening. There are no hypoglycemic associated symptoms. Pertinent negatives for hypoglycemia include no confusion, headaches, pallor or seizures. There are no diabetic associated symptoms. Pertinent negatives for diabetes include no chest pain, no fatigue, no polydipsia, no polyphagia, no  polyuria and no weakness. There are no hypoglycemic complications. Symptoms are worsening. There are no diabetic complications. Risk factors for coronary artery disease include diabetes mellitus, dyslipidemia, hypertension, male sex, obesity, sedentary lifestyle and tobacco exposure. Current diabetic treatment includes insulin injections. He is compliant with treatment some of the time. His weight is stable. He is following a generally unhealthy diet. When asked about meal planning, he reported none. He has had a previous visit with a dietitian. He never participates in exercise. His home blood glucose  trend is increasing steadily. His breakfast blood glucose range is generally 140-180 mg/dl. His lunch blood glucose range is generally 140-180 mg/dl. His dinner blood glucose range is generally 180-200 mg/dl. His overall blood glucose range is 180-200 mg/dl. (He did not bring his logs, his meter shows average blood glucose of 200-216.) Eye exam is current.  Hyperlipidemia  This is a chronic problem. The current episode started more than 1 year ago. Pertinent negatives include no chest pain, myalgias or shortness of breath. Current antihyperlipidemic treatment includes statins. Compliance problems include adherence to diet and adherence to exercise.  Risk factors for coronary artery disease include dyslipidemia, diabetes mellitus, obesity, male sex and a sedentary lifestyle.  Hypertension  This is a chronic problem. The current episode started more than 1 year ago. Pertinent negatives include no chest pain, headaches, neck pain, palpitations or shortness of breath. Risk factors for coronary artery disease include diabetes mellitus, dyslipidemia, male gender, obesity, smoking/tobacco exposure and sedentary lifestyle. Compliance problems include diet and exercise.      Review of Systems  Constitutional: Negative for chills, fatigue, fever and unexpected weight change.  HENT: Negative for dental problem, mouth sores and trouble swallowing.   Eyes: Negative for visual disturbance.  Respiratory: Negative for cough, choking, chest tightness, shortness of breath and wheezing.   Cardiovascular: Negative for chest pain, palpitations and leg swelling.  Gastrointestinal: Negative for abdominal distention, abdominal pain, constipation, diarrhea, nausea and vomiting.  Endocrine: Negative for polydipsia, polyphagia and polyuria.  Genitourinary: Negative for dysuria, flank pain, hematuria and urgency.  Musculoskeletal: Negative for back pain, gait problem, myalgias and neck pain.  Skin: Negative for pallor, rash  and wound.  Neurological: Negative for seizures, syncope, weakness, numbness and headaches.  Psychiatric/Behavioral: Negative.  Negative for confusion and dysphoric mood.    Objective:    BP 137/72   Pulse 78   Ht _0  (1.93 m)   Wt (!) 379 lb (171.9 kg)   BMI 46.13 kg/m   Wt Readings from Last 3 Encounters:  01/14/17 (!) 379 lb (171.9 kg)  10/12/16 (!) 375 lb (170.1 kg)  09/07/16 (!) 379 lb 3.2 oz (172 kg)    Physical Exam  Constitutional: He is oriented to person, place, and time. He appears well-developed and well-nourished. He is cooperative. No distress.  HENT:  Head: Normocephalic and atraumatic.  Neck: Normal range of motion. Neck supple. No tracheal deviation present. No thyromegaly present.  Cardiovascular: Normal rate, S1 normal, S2 normal and normal heart sounds.  Exam reveals no gallop.   No murmur heard. Pulses:      Dorsalis pedis pulses are 1+ on the right side, and 1+ on the left side.       Posterior tibial pulses are 1+ on the right side, and 1+ on the left side.  Pulmonary/Chest: Breath sounds normal. No respiratory distress. He has no wheezes.  Abdominal: Soft. Bowel sounds are normal. He exhibits no distension. There is no tenderness. There is no  guarding and no CVA tenderness.  Musculoskeletal: He exhibits no edema.       Right shoulder: He exhibits no swelling and no deformity.  Neurological: He is alert and oriented to person, place, and time. He has normal strength and normal reflexes. No cranial nerve deficit or sensory deficit. Gait normal.  Skin: Skin is warm and dry. No rash noted. No cyanosis. Nails show no clubbing.  Psychiatric: He has a normal mood and affect. His speech is normal and behavior is normal. Judgment and thought content normal. Cognition and memory are normal.    Diabetic Labs (most recent): Lab Results  Component Value Date   HGBA1C 8.0 (H) 01/06/2017   HGBA1C 7.7 (H) 09/30/2016   HGBA1C 7.5 (H) 06/26/2016   Recent Results  (from the past 2160 hour(s))  Hemoglobin A1c     Status: Abnormal   Collection Time: 01/06/17  1:35 PM  Result Value Ref Range   Hgb A1c MFr Bld 8.0 (H) 4.8 - 5.6 %    Comment:          Pre-diabetes: 5.7 - 6.4          Diabetes: >6.4          Glycemic control for adults with diabetes: <7.0    Est. average glucose Bld gHb Est-mCnc 183 mg/dL  CMP14+EGFR     Status: Abnormal   Collection Time: 01/06/17  1:35 PM  Result Value Ref Range   Glucose 192 (H) 65 - 99 mg/dL   BUN 19 8 - 27 mg/dL   Creatinine, Ser 1.18 0.76 - 1.27 mg/dL   GFR calc non Af Amer 64 >59 mL/min/1.73   GFR calc Af Amer 74 >59 mL/min/1.73   BUN/Creatinine Ratio 16 10 - 24   Sodium 139 134 - 144 mmol/L   Potassium 4.5 3.5 - 5.2 mmol/L   Chloride 100 96 - 106 mmol/L   CO2 22 18 - 29 mmol/L   Calcium 8.8 8.6 - 10.2 mg/dL   Total Protein 6.6 6.0 - 8.5 g/dL   Albumin 4.1 3.6 - 4.8 g/dL   Globulin, Total 2.5 1.5 - 4.5 g/dL   Albumin/Globulin Ratio 1.6 1.2 - 2.2   Bilirubin Total 0.9 0.0 - 1.2 mg/dL   Alkaline Phosphatase 120 (H) 39 - 117 IU/L   AST 33 0 - 40 IU/L   ALT 27 0 - 44 IU/L    Assessment & Plan:   1. Uncontrolled type 2 diabetes mellitus with complication, with long-term current use of insulin ([place])  - his diabetes is  complicated by morbid obesity and sleep apnea and patient remains at a high risk for more acute and chronic complications of diabetes which include CAD, CVA, CKD, retinopathy, and neuropathy. These are all discussed in detail with the patient.  Patient came With improved fasting but still above target postprandial glucose profile, A1c increasing to 8% from  7.7% staying stable from last visit.  -  Glucose logs and insulin administration records pertaining to this visit,  to be scanned into patient's records.  Recent labs reviewed.   - I have re-counseled the patient on diet management and weight loss  by adopting a carbohydrate restricted / protein rich  Diet.  - Suggestion is made for  patient to avoid simple carbohydrates   from their diet including Cakes , Desserts, Ice Cream,  Soda (  diet and regular) , Sweet Tea , Candies,  Chips, Cookies, Artificial Sweeteners,   and "Sugar-free" Products .  This  will help patient to have stable blood glucose profile and potentially avoid unintended  Weight gain.  - Patient is advised to stick to a routine mealtimes to eat 3 meals  a day and avoid unnecessary snacks ( to snack only to correct hypoglycemia).  - The patient  has been  scheduled with Jearld Fenton, RDN, CDE for individualized DM education.  - I have approached patient with the following individualized plan to manage diabetes and patient agrees.  - Continue basal insulin Basaglar 90 units qhs, and  continue Humalog  20 units TIDAC DAC for pre-meal BG readings of 90-156m/dl, plus patient specific correction dose of rapid acting insulin for unexpected hyperglycemia above 1548mdl, associated with strict monitoring of BG AC and HS.  -Adjustment parameters for hypo and hyperglycemia were given in a written document to patient. -Patient is encouraged to call clinic for blood glucose levels less than 70 or above 300 mg /dl. -He will continue on Victoza 1.8 mg subcutaneous daily. -I will continue metformin 50051mo BID, did not tolerate Invokana.   - Patient specific target  for A1c; LDL, HDL, Triglycerides, and  Waist Circumference were discussed in detail.  2) BP/HTN : controlled. Continue current medications. 3) Lipids/HPL:  continue statins. 4)  Weight/Diet: CDE consult in progress, exercise, and carbohydrates information provided.  5) Chronic Care/Health Maintenance:  -Patient is on ACEI/ARB and Statin medications and encouraged to continue to follow up with Ophthalmology, Podiatrist at least yearly or according to recommendations, and advised to  stay away from smoking. I have recommended yearly flu vaccine and pneumonia vaccination at least every 5 years; moderate  intensity exercise for up to 150 minutes weekly; and  sleep for at least 7 hours a day.  - 25 minutes of time was spent on the care of this patient , 50% of which was applied for counseling on diabetes complications and their preventions.  - I advised patient to maintain close follow up with DhrGlenda ChromanD for primary care needs.  Patient is asked to bring meter and  blood glucose logs during their next visit.   Follow up plan: -Return in about 3 months (around 04/15/2017) for follow up with pre-visit labs, meter, and logs.  GebGlade LloydD Phone: [phone]: [phone]/09/2017, 3:13 PM

## 2017-01-14 NOTE — Patient Instructions (Signed)

## 2017-01-15 DIAGNOSIS — Z299 Encounter for prophylactic measures, unspecified: Secondary | ICD-10-CM | POA: Diagnosis not present

## 2017-01-15 DIAGNOSIS — I872 Venous insufficiency (chronic) (peripheral): Secondary | ICD-10-CM | POA: Diagnosis not present

## 2017-01-15 DIAGNOSIS — J449 Chronic obstructive pulmonary disease, unspecified: Secondary | ICD-10-CM | POA: Diagnosis not present

## 2017-01-15 DIAGNOSIS — R945 Abnormal results of liver function studies: Secondary | ICD-10-CM | POA: Diagnosis not present

## 2017-01-15 DIAGNOSIS — E7800 Pure hypercholesterolemia, unspecified: Secondary | ICD-10-CM | POA: Diagnosis not present

## 2017-01-15 DIAGNOSIS — E1142 Type 2 diabetes mellitus with diabetic polyneuropathy: Secondary | ICD-10-CM | POA: Diagnosis not present

## 2017-01-15 DIAGNOSIS — Z6841 Body Mass Index (BMI) 40.0 and over, adult: Secondary | ICD-10-CM | POA: Diagnosis not present

## 2017-01-15 DIAGNOSIS — J209 Acute bronchitis, unspecified: Secondary | ICD-10-CM | POA: Diagnosis not present

## 2017-01-15 DIAGNOSIS — E1165 Type 2 diabetes mellitus with hyperglycemia: Secondary | ICD-10-CM | POA: Diagnosis not present

## 2017-01-26 ENCOUNTER — Ambulatory Visit: Payer: Medicare Other | Admitting: Pulmonary Disease

## 2017-01-27 ENCOUNTER — Ambulatory Visit (INDEPENDENT_AMBULATORY_CARE_PROVIDER_SITE_OTHER)
Admission: RE | Admit: 2017-01-27 | Discharge: 2017-01-27 | Disposition: A | Discharge status: Home / Self Care | Payer: Medicare Other | Source: Ambulatory Visit | Attending: Pulmonary Disease | Admitting: Pulmonary Disease

## 2017-01-27 ENCOUNTER — Encounter: Payer: Self-pay | Admitting: Pulmonary Disease

## 2017-01-27 ENCOUNTER — Ambulatory Visit (INDEPENDENT_AMBULATORY_CARE_PROVIDER_SITE_OTHER): Payer: Medicare Other | Admitting: Pulmonary Disease

## 2017-01-27 VITALS — BP 128/74 | HR 88 | Ht 76.0 in | Wt 385.6 lb

## 2017-01-27 DIAGNOSIS — I2781 Cor pulmonale (chronic): Secondary | ICD-10-CM

## 2017-01-27 DIAGNOSIS — R0602 Shortness of breath: Secondary | ICD-10-CM | POA: Diagnosis not present

## 2017-01-27 DIAGNOSIS — J449 Chronic obstructive pulmonary disease, unspecified: Secondary | ICD-10-CM | POA: Diagnosis not present

## 2017-01-27 DIAGNOSIS — G4733 Obstructive sleep apnea (adult) (pediatric): Secondary | ICD-10-CM

## 2017-01-27 DIAGNOSIS — R05 Cough: Secondary | ICD-10-CM | POA: Diagnosis not present

## 2017-01-27 MED ORDER — POTASSIUM CHLORIDE CRYS ER 20 MEQ PO TBCR: 20.0000 meq | EXTENDED_RELEASE_TABLET | Freq: Once | ORAL | 4 refills | Status: DC

## 2017-01-27 NOTE — Assessment & Plan Note (Signed)
Ct CPAP 14 cm

## 2017-01-27 NOTE — Progress Notes (Signed)
   Subjective:    Patient ID: Kenneth Patrick, male    DOB: 04-21-1951, 66 y.o.   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  h/o  'asthma' many years ago, has used inhalers & diskus c/o wheezing on perfumes, cooking  He smoked >60 Pyrs before quitting in 2006.    01/27/2017  Chief Complaint  Patient presents with  . Follow-up    Has COPD. States Breo and rescue inhalers are not helping with breathing. Pt is still SOB.  First noticed this last year.    On prior visit 06/2016, he was changed from breo to [employer]. This follow-up visit 09/2016, he felt that this was not working and was changed back to breo  He continues to remain short of breath, and complains of dyspnea on exertion, denies cough or wheezing. He is gained 19 pounds from his weight of 366 in 06/2016 to 385 now He has bipedal edema and admits to not using Lasix on a daily basis Is accompanied by his wife who corroborates history  We provided him with a new CPAP and he simply loves this, and denies daytime somnolence or fatigue and feels like he rests much better  He does not have a problem with seasonal allergies Significant tests/ events  Spirometry 2011- severe airway obstruction with a low ratio &FEV1 of 25 %, good effort !   Meds -12/2012 -Added spiriva - self dc'd -Does not feel he needs it, thought it caused leg swelling. 06/2013 -Breo in place of Symbicort   Admitted Feb 2015 -for acute hypoxic respiratory failure and AKI/metabolic acidosis in the setting of RSV bronchitis.  CT chest showed bilateral lower lobe bronchiectasis.  2-D echo showed grade 1 diastolic dysfunction with preserved EF.  Had a low titer c-ANCA , Positive  Seen by ID , HIV neg.  Histoplasma ag positive in urine , but serology neg . (lived in [place] 10 yrs ago)   03/2007 CPAP 14 cm   Review of Systems neg for any significant sore throat, dysphagia, itching,  sneezing, nasal congestion or excess/ purulent secretions, fever, chills, sweats, unintended wt loss, pleuritic or exertional cp, hempoptysis, orthopnea pnd or change in chronic leg swelling. Also denies presyncope, palpitations, heartburn, abdominal pain, nausea, vomiting, diarrhea or change in bowel or urinary habits, dysuria,hematuria, rash, arthralgias, visual complaints, headache, numbness weakness or ataxia.     Objective:   Physical Exam  Gen. Pleasant, obese, in no distress ENT - no lesions, no post nasal drip Neck: No JVD, no thyromegaly, no carotid bruits Lungs: no use of accessory muscles, no dullness to percussion, decreased without rales or rhonchi  Cardiovascular: Rhythm regular, heart sounds  normal, no murmurs or gallops, 2+ peripheral edema Musculoskeletal: No deformities, no cyanosis or clubbing , no tremors       Assessment & Plan:

## 2017-01-27 NOTE — Assessment & Plan Note (Signed)
Chest x-ray today  Do not feel he is having a COPD exacerbation, Stay on Breo for now

## 2017-01-27 NOTE — Assessment & Plan Note (Signed)
He has gained 19 pounds since her visit in September 2017  Take Lasix 40 mg twice daily Prescription for potassium chloride 20 mEq daily  Office visit in 2 weeks with blood work- [facility]

## 2017-01-27 NOTE — Patient Instructions (Signed)
He has gained 19 pounds since her visit in September 2017  Take Lasix 40 mg twice daily Prescription for potassium chloride 20 mEq daily  Chest x-ray today  Office visit in 2 weeks with blood work- [facility]

## 2017-02-08 ENCOUNTER — Other Ambulatory Visit (INDEPENDENT_AMBULATORY_CARE_PROVIDER_SITE_OTHER): Payer: Medicare Other

## 2017-02-08 DIAGNOSIS — I2781 Cor pulmonale (chronic): Secondary | ICD-10-CM

## 2017-02-08 LAB — BASIC METABOLIC PANEL
BUN: 26 mg/dL — AB (ref 6–23)
CHLORIDE: 102 meq/L (ref 96–112)
CO2: 30 mEq/L (ref 19–32)
Calcium: 10.2 mg/dL (ref 8.4–10.5)
Creatinine, Ser: 1.38 mg/dL (ref 0.40–1.50)
GFR: 54.86 mL/min — ABNORMAL LOW (ref >60.00)
GLUCOSE: 243 mg/dL — AB (ref 70–99)
POTASSIUM: 4.7 meq/L (ref 3.5–5.1)
Sodium: 140 mEq/L (ref 135–145)

## 2017-02-09 NOTE — Progress Notes (Signed)
Spoke with patient and informed him of results. Pt verbalized understanding. Nothing further is needed.

## 2017-02-10 ENCOUNTER — Encounter: Payer: Self-pay | Admitting: Adult Health

## 2017-02-10 ENCOUNTER — Ambulatory Visit (INDEPENDENT_AMBULATORY_CARE_PROVIDER_SITE_OTHER): Payer: Medicare Other | Admitting: Adult Health

## 2017-02-10 ENCOUNTER — Other Ambulatory Visit (INDEPENDENT_AMBULATORY_CARE_PROVIDER_SITE_OTHER): Payer: Medicare Other

## 2017-02-10 VITALS — BP 140/68 | HR 90 | Ht 76.0 in | Wt 380.6 lb

## 2017-02-10 DIAGNOSIS — R0602 Shortness of breath: Secondary | ICD-10-CM

## 2017-02-10 DIAGNOSIS — I2781 Cor pulmonale (chronic): Secondary | ICD-10-CM

## 2017-02-10 DIAGNOSIS — J449 Chronic obstructive pulmonary disease, unspecified: Secondary | ICD-10-CM

## 2017-02-10 DIAGNOSIS — G4733 Obstructive sleep apnea (adult) (pediatric): Secondary | ICD-10-CM | POA: Diagnosis not present

## 2017-02-10 LAB — CBC WITH DIFFERENTIAL/PLATELET
BASOS ABS: 0.1 10*3/uL (ref 0.0–0.1)
BASOS PCT: 1 % (ref 0.0–3.0)
EOS ABS: 0.4 10*3/uL (ref 0.0–0.7)
Eosinophils Relative: 5.8 % — ABNORMAL HIGH (ref 0.0–5.0)
HEMATOCRIT: 36.2 % — AB (ref 39.0–52.0)
HEMOGLOBIN: 12 g/dL — AB (ref 13.0–17.0)
LYMPHS PCT: 29.6 % (ref 12.0–46.0)
Lymphs Abs: 1.9 10*3/uL (ref 0.7–4.0)
MCHC: 33.1 g/dL (ref 30.0–36.0)
MCV: 84.8 fl (ref 78.0–100.0)
MONO ABS: 0.7 10*3/uL (ref 0.1–1.0)
Monocytes Relative: 10.3 % (ref 3.0–12.0)
Neutro Abs: 3.5 10*3/uL (ref 1.4–7.7)
Neutrophils Relative %: 53.3 % (ref 43.0–77.0)
Platelets: 261 10*3/uL (ref 150.0–400.0)
RBC: 4.27 Mil/uL (ref 4.22–5.81)
RDW: 15.3 % (ref 11.5–15.5)
WBC: 6.5 10*3/uL (ref 4.0–10.5)

## 2017-02-10 LAB — BASIC METABOLIC PANEL
BUN: 29 mg/dL — AB (ref 6–23)
CHLORIDE: 102 meq/L (ref 96–112)
CO2: 30 meq/L (ref 19–32)
Calcium: 9 mg/dL (ref 8.4–10.5)
Creatinine, Ser: 1.41 mg/dL (ref 0.40–1.50)
GFR: 53.52 mL/min — ABNORMAL LOW (ref >60.00)
GLUCOSE: 207 mg/dL — AB (ref 70–99)
POTASSIUM: 4.1 meq/L (ref 3.5–5.1)
SODIUM: 138 meq/L (ref 135–145)

## 2017-02-10 LAB — BRAIN NATRIURETIC PEPTIDE: Pro B Natriuretic peptide (BNP): 16 pg/mL (ref 0.0–100.0)

## 2017-02-10 MED ORDER — PREDNISONE 20 MG PO TABS: ORAL_TABLET | ORAL | 0 refills | Status: DC

## 2017-02-10 NOTE — Patient Instructions (Addendum)
Prednisone taper over next week  Continue on BREO 1 puff daily  Begin Spriiva 2 puffs daily .  Refer to Pulmonary rehab .Kenneth Patrick  Continue on CPAP At bedtime   Work on weight loss.  Decrease Lasix '40mg'$  daily , take extra lasix '40mg'$  if weight increase by 3 lbs .  Follow up with Dr. Elsworth Soho  In 6-8 weeks and As needed   Please contact office for sooner follow up if symptoms do not improve or worsen or seek emergency care

## 2017-02-10 NOTE — Assessment & Plan Note (Signed)
Cont on CPAP  

## 2017-02-10 NOTE — Addendum Note (Signed)
Addended by: Parke Poisson E on: 02/10/2017 04:06 PM   Modules accepted: Orders

## 2017-02-10 NOTE — Addendum Note (Signed)
Addended by: Collier Salina on: 02/10/2017 05:42 PM   Modules accepted: Orders

## 2017-02-10 NOTE — Progress Notes (Signed)
$'@Patient'i$  ID: Kenneth Patrick, male    DOB: 1951/07/22, 66 y.o.   MRN: [id number]  Chief Complaint  Patient presents with  . 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  h/o  'asthma' many years ago, has used inhalers & diskus c/o wheezing on perfumes, cooking  He smoked >60 Pyrs before quitting in 2006.   TEST  Spirometry 2011- severe airway obstruction with a low ratio &FEV1 of 25 %, good effort !   Meds -12/2012 -Added spiriva - self dc'd -Does not feel he needs it, thought it caused leg swelling. 06/2013 -Breo in place of Symbicort   Admitted Feb 2015 -for acute hypoxic respiratory failure and AKI/metabolic acidosis in the setting of RSV bronchitis.  CT chest showed bilateral lower lobe bronchiectasis.  2-D echo showed grade 1 diastolic dysfunction with preserved EF.  Had a low titer c-ANCA , Positive  Seen by ID , HIV neg.  Histoplasma ag positive in urine , but serology neg . (lived in [place] 10 yrs ago)    02/10/2017 Follow up : COPD  Patient presents for a two-week follow-up. Patient was seen last visit with increased COPD symptoms with shortness of breath and extremity edema. Pt says over last year , breathing has been going down [place]. Gets winded with minimal walking .  Last ov .,  Patient was felt to have a component of volume overload. He was started on Lasix 40 mg twice daily. CXR was clear last ov.  Labs reviewed in chart with scr minimal increased at 1.38.  He remains on BREO daily  Feels breathing is about the same. Since last ov . Gets winded with walking any distance.  No dyspnea at rest . O2 sats 96% on RA. , walk test w/ no desats.  Does have intermittent wheezing .  Wt dropped down 5 lbs. Unsure if ankle edema has changed.  Spirometry today shows an FEV1 of 41%, ratio 60, FVC 51%.(improved from 2011) .  He admits he is very sedentary.  He denies any  chest pain, palpitations, syncope, orthopnea, PND, or increased leg swelling.  Had kidney stone yesterday , he measured at home 4.44m . This was very stressful and caused some anxiety . He is feeling better now.   Doing well on CPAP At bedtime  . Wears each night . Feels rested.   Walk test in office with no desats -O2 sats 92% on room air.  FENO today 13.   Denies chest pain, palpitation or syncope.   Allergies  Allergen Reactions  . Penicillins     Childhood allergy     Immunization History  Administered Date(s) Administered  . Influenza Split 10/23/2011  . Influenza Whole 09/04/2010  . Influenza,inj,Quad PF,36+ Mos 06/08/2013, 09/03/2014, 07/31/2015, 06/22/2016  . Pneumococcal Conjugate-13 03/05/2015  . Pneumococcal Polysaccharide-23 05/14/2011, 11/27/2013    Past Medical History:  Diagnosis Date  . Asthma   . Depression   . Diabetic neuropathy ([facility]   . DM2 (diabetes mellitus, type 2) ([facility]   . Dyspnea   . Hypercholesterolemia   . Low back pain   . Respiratory infection   . Sleep apnea     Tobacco History: History  Smoking Status  . Former Smoker  . Packs/day: 1.50  . Years: 20.00  . Quit date: 10/05/2004  Smokeless Tobacco  . Never Used   Counseling given:  Not Answered   Outpatient Encounter Prescriptions as of 02/10/2017  Medication Sig  . albuterol (VENTOLIN HFA) 108 (90 BASE) MCG/ACT inhaler Inhale 2 puffs into the lungs every 6 (six) hours as needed.    [name] atorvastatin (LIPITOR) 80 MG tablet Take 80 mg by mouth At bedtime.   . fluticasone furoate-vilanterol (BREO ELLIPTA) 100-25 MCG/INH AEPB Inhale 1 puff into the lungs daily.  . furosemide (LASIX) 40 MG tablet Take 40 mg by mouth daily as needed for fluid.   [name] gabapentin (NEURONTIN) 300 MG capsule Take 300 mg by mouth 2 (two) times daily.   . Insulin Glargine (BASAGLAR KWIKPEN) 100 UNIT/ML SOPN INJECT SUBCUTANEOUSLY 90  UNITS AT BEDTIME  . insulin lispro (HUMALOG) 100 UNIT/ML injection Use up to 78  units daily  . metFORMIN (GLUCOPHAGE) 500 MG tablet Take 1 tablet by mouth 2 (two) times daily.  Glory Rosebush VERIO test strip TEST AS DIRECTED 4 TIMES  DAILY  . oxymetazoline (AFRIN) 0.05 % nasal spray Place 1-2 sprays into the nose 2 (two) times daily as needed.   . sertraline (ZOLOFT) 100 MG tablet Take 200 mg by mouth daily.   [name] VICTOZA 18 MG/3ML SOLN Inject 1.8 Units into the skin daily.   . potassium chloride SA (KLOR-CON M20) 20 MEQ tablet Take 1 tablet (20 mEq total) by mouth once.   No facility-administered encounter medications on file as of 02/10/2017.      Review of Systems  Constitutional:   No  weight loss, night sweats,  Fevers, chills, + fatigue, or  lassitude.  HEENT:   No headaches,  Difficulty swallowing,  Tooth/dental problems, or  Sore throat,                No sneezing, itching, ear ache, nasal congestion, post nasal drip,   CV:  No chest pain,  Orthopnea, PND, , anasarca, dizziness, palpitations, syncope.   GI  No heartburn, indigestion, abdominal pain, nausea, vomiting, diarrhea, change in bowel habits, loss of appetite, bloody stools.   Resp:    No chest wall deformity  Skin: no rash or lesions.  GU: no dysuria, change in color of urine, no urgency or frequency.  No flank pain, no hematuria   MS:  No joint pain or swelling.  No decreased range of motion.  No back pain.    Physical Exam  BP 140/68 (BP Location: Left Arm, Cuff Size: Large)   Pulse 90   Ht '6\' 4"'$  (1.93 m)   Wt (!) 380 lb 9.6 oz (172.6 kg)   SpO2 96%   BMI 46.33 kg/m   GEN: A/Ox3; pleasant , NAD, morbidly obese   HEENT:  [place]/AT,  EACs-clear, TMs-wnl, NOSE-clear, THROAT-clear, no lesions, no postnasal drip or exudate noted. Class 3 MP airway   NECK:  Supple w/ fair ROM; no JVD; normal carotid impulses w/o bruits; no thyromegaly or nodules palpated; no lymphadenopathy.    RESP  Decreased BS in bases ,  no accessory muscle use, no dullness to percussion  CARD:  RRR, no m/r/g, 1+  peripheral edema, pulses intact, no cyanosis or clubbing.  GI:   Soft & nt; nml bowel sounds; no organomegaly or masses detected.   Musco: Warm bil, no deformities or joint swelling noted.   Neuro: alert, no focal deficits noted.    Skin: Warm, no lesions or rashes    Lab Results:  CBC    Component Value Date/Time   WBC 6.5 12/28/2013 1534   RBC 4.16 (L) 12/28/2013  1534   HGB 11.4 (L) 12/28/2013 1534   HCT 34.3 (L) 12/28/2013 1534   PLT 258 12/28/2013 1534   MCV 82.5 12/28/2013 1534   MCH 27.4 12/28/2013 1534   MCHC 33.2 12/28/2013 1534   RDW 15.8 (H) 12/28/2013 1534   LYMPHSABS 1.6 12/28/2013 1534   MONOABS 0.7 12/28/2013 1534   EOSABS 0.3 12/28/2013 1534   BASOSABS 0.0 12/28/2013 1534    BMET    Component Value Date/Time   NA 140 02/08/2017 1533   NA 139 01/06/2017 1335   K 4.7 02/08/2017 1533   CL 102 02/08/2017 1533   CO2 30 02/08/2017 1533   GLUCOSE 243 (H) 02/08/2017 1533   BUN 26 (H) 02/08/2017 1533   BUN 19 01/06/2017 1335   CREATININE 1.38 02/08/2017 1533   CREATININE 1.27 (H) 06/26/2016 1403   CALCIUM 10.2 02/08/2017 1533   GFRNONAA 64 01/06/2017 1335   GFRNONAA 59 (L) 06/26/2016 1403   GFRAA 74 01/06/2017 1335   GFRAA 69 06/26/2016 1403    BNP No results found for: BNP  ProBNP    Component Value Date/Time   PROBNP 199.5 (H) 11/25/2013 0200    Imaging: Dg Chest 2 View  Result Date: 01/27/2017 CLINICAL DATA:  Cough and edema with shortness of breath EXAM: CHEST  2 VIEW COMPARISON:  Chest radiograph January 10, 2014 and chest CT February 01, 2015 FINDINGS: There is no edema or consolidation. Heart size and pulmonary vascularity are normal. No adenopathy. No bone lesions. IMPRESSION: No edema or consolidation. Electronically Signed   By: Lowella Grip III M.D.   On: 01/27/2017 15:41     Assessment & Plan:   No problem-specific Assessment & Plan notes found for this encounter.     Rexene Edison, NP 02/10/2017

## 2017-02-10 NOTE — Assessment & Plan Note (Signed)
?  exacerbation  Spirometry is actually improved from 2011.  Check bnp and cbc to make sure other eitology  No desats with walk test. CXR was clear  Deconditioning may be contributing factor  Refer to pulmonary rehab   Plan  Patient Instructions  Prednisone taper over next week  Continue on BREO 1 puff daily  Begin Spriiva 2 puffs daily .  Refer to Pulmonary rehab .Deneise Lever [place]  Continue on CPAP At bedtime   Work on weight loss.  Decrease Lasix '40mg'$  daily , take extra lasix '40mg'$  if weight increase by 3 lbs .  Follow up with Dr. Elsworth Soho  In 6-8 weeks and As needed   Please contact office for sooner follow up if symptoms do not improve or worsen or seek emergency care

## 2017-02-10 NOTE — Assessment & Plan Note (Signed)
Diastolic CHF - wt improved with lasix  Scr did rise and had kidney stone  Will decrease dose with option for extra based on wt gain.  Check bnp   Plan  Patient Instructions  Prednisone taper over next week  Continue on BREO 1 puff daily  Begin Spriiva 2 puffs daily .  Refer to Pulmonary rehab .Deneise Lever [place]  Continue on CPAP At bedtime   Work on weight loss.  Decrease Lasix '40mg'$  daily , take extra lasix '40mg'$  if weight increase by 3 lbs .  Follow up with Dr. Elsworth Soho  In 6-8 weeks and As needed   Please contact office for sooner follow up if symptoms do not improve or worsen or seek emergency care

## 2017-02-11 LAB — NITRIC OXIDE: NITRIC OXIDE: 13

## 2017-02-11 MED ORDER — LEVALBUTEROL HCL 0.63 MG/3ML IN NEBU
0.6300 mg | INHALATION_SOLUTION | Freq: Once | RESPIRATORY_TRACT | Status: AC
  Administered 2017-02-10: 0.63 mg via RESPIRATORY_TRACT

## 2017-02-11 MED ORDER — TIOTROPIUM BROMIDE MONOHYDRATE 2.5 MCG/ACT IN AERS: 2.0000 | INHALATION_SPRAY | Freq: Every day | RESPIRATORY_TRACT | 0 refills | Status: DC

## 2017-02-11 MED ORDER — TIOTROPIUM BROMIDE MONOHYDRATE 2.5 MCG/ACT IN AERS: 2.0000 | INHALATION_SPRAY | Freq: Every day | RESPIRATORY_TRACT | 5 refills | Status: DC

## 2017-02-11 NOTE — Progress Notes (Signed)
Patient seen in the office today and instructed on use of Spiriva Respimat 2.41mg.  Patient expressed understanding and demonstrated technique. [facility]Parke [facility]CP[facility] 02/10/2017

## 2017-02-11 NOTE — Addendum Note (Signed)
Addended by: Parke Poisson E on: 02/11/2017 09:36 AM   Modules accepted: Orders

## 2017-02-23 ENCOUNTER — Telehealth: Payer: Self-pay | Admitting: Adult Health

## 2017-02-23 DIAGNOSIS — I2781 Cor pulmonale (chronic): Secondary | ICD-10-CM

## 2017-02-23 NOTE — Telephone Encounter (Signed)
New order placed under RA

## 2017-02-25 ENCOUNTER — Telehealth: Payer: Self-pay | Admitting: Pulmonary Disease

## 2017-02-25 ENCOUNTER — Telehealth: Payer: Self-pay

## 2017-02-25 NOTE — Telephone Encounter (Signed)
Spoke with Diane at cardio rehab, correct order for the pulm has been corrected. Nothing else is needed.

## 2017-02-25 NOTE — Telephone Encounter (Signed)
Left a message on Diane's VM to call back.

## 2017-02-25 NOTE — Telephone Encounter (Signed)
See other phone note dated today. Nothing further needed.

## 2017-03-03 ENCOUNTER — Other Ambulatory Visit: Payer: Self-pay

## 2017-03-03 MED ORDER — INSULIN LISPRO 100 UNIT/ML ~~LOC~~ SOLN: SUBCUTANEOUS | 0 refills | Status: DC

## 2017-03-16 NOTE — Progress Notes (Signed)
Reviewed & agree with plan  

## 2017-03-21 ENCOUNTER — Other Ambulatory Visit: Payer: Self-pay | Admitting: Pulmonary Disease

## 2017-04-05 ENCOUNTER — Other Ambulatory Visit: Payer: Self-pay | Admitting: "Endocrinology

## 2017-04-05 DIAGNOSIS — E118 Type 2 diabetes mellitus with unspecified complications: Secondary | ICD-10-CM | POA: Diagnosis not present

## 2017-04-05 DIAGNOSIS — E1165 Type 2 diabetes mellitus with hyperglycemia: Secondary | ICD-10-CM

## 2017-04-06 ENCOUNTER — Telehealth: Payer: Self-pay | Admitting: Pulmonary Disease

## 2017-04-06 ENCOUNTER — Ambulatory Visit (INDEPENDENT_AMBULATORY_CARE_PROVIDER_SITE_OTHER): Payer: Medicare Other | Admitting: Pulmonary Disease

## 2017-04-06 ENCOUNTER — Encounter: Payer: Self-pay | Admitting: Pulmonary Disease

## 2017-04-06 DIAGNOSIS — G4733 Obstructive sleep apnea (adult) (pediatric): Secondary | ICD-10-CM | POA: Diagnosis not present

## 2017-04-06 DIAGNOSIS — I10 Essential (primary) hypertension: Secondary | ICD-10-CM | POA: Diagnosis not present

## 2017-04-06 DIAGNOSIS — I2781 Cor pulmonale (chronic): Secondary | ICD-10-CM | POA: Diagnosis not present

## 2017-04-06 DIAGNOSIS — R0600 Dyspnea, unspecified: Secondary | ICD-10-CM

## 2017-04-06 LAB — COMPREHENSIVE METABOLIC PANEL
ALBUMIN: 4.3 g/dL (ref 3.6–4.8)
ALK PHOS: 123 IU/L — AB (ref 39–117)
ALT: 39 IU/L (ref 0–44)
AST: 52 IU/L — ABNORMAL HIGH (ref 0–40)
Albumin/Globulin Ratio: 2.2 (ref 1.2–2.2)
BILIRUBIN TOTAL: 1.2 mg/dL (ref 0.0–1.2)
BUN / CREAT RATIO: 14 (ref 10–24)
BUN: 17 mg/dL (ref 8–27)
CHLORIDE: 102 mmol/L (ref 96–106)
CO2: 22 mmol/L (ref 20–29)
Calcium: 9.2 mg/dL (ref 8.6–10.2)
Creatinine, Ser: 1.25 mg/dL (ref 0.76–1.27)
GFR calc Af Amer: 69 mL/min/{1.73_m2} (ref >59)
GFR calc non Af Amer: 60 mL/min/{1.73_m2} (ref >59)
GLOBULIN, TOTAL: 2 g/dL (ref 1.5–4.5)
Glucose: 237 mg/dL — ABNORMAL HIGH (ref 65–99)
POTASSIUM: 5.2 mmol/L (ref 3.5–5.2)
SODIUM: 139 mmol/L (ref 134–144)
Total Protein: 6.3 g/dL (ref 6.0–8.5)

## 2017-04-06 LAB — HGB A1C W/O EAG: HEMOGLOBIN A1C: 7.8 % — AB (ref 4.8–5.6)

## 2017-04-06 NOTE — Assessment & Plan Note (Signed)
Continue CPAP 14 cm

## 2017-04-06 NOTE — Assessment & Plan Note (Signed)
Persistent dyspnea, although he states compliance with Lasix, does not seem like we have achieved narrative negative balance yet because his weight is maintained at 385 pounds  We'll also look at alternative etiologies Schedule CT angiogram of the chest for blood clots Also schedule echocardiogram  Stay on Lasix 40 mg in a.m. and repeat dose at 3 PM for 2 weeks

## 2017-04-06 NOTE — Patient Instructions (Signed)
Schedule CT angiogram of the chest for blood clots Also schedule echocardiogram  Stay on Lasix 40 mg in a.m. and repeat dose at 3 PM for 2 weeks

## 2017-04-06 NOTE — Assessment & Plan Note (Signed)
Continue Brio and Spiriva

## 2017-04-06 NOTE — Progress Notes (Signed)
   Subjective:    Patient ID: Kenneth Patrick, male    DOB: 1951-04-04, 66 y.o.   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  h/o  'asthma' many years ago, has used inhalers & diskus c/o wheezing on perfumes, cooking  He smoked >60 Pyrs before quitting in 2006.      This is his third visit in the last 3-4 months, he has been diuresed based on his weight gain from 366 in 06/2016 to 385 pounds He continues to remain short of breath while walking short distances. He is compliant with his Lasix and states that he is diuresing well however his weight has not changed from 385 pounds He does not feel that Spiriva is helping him much  He obtained a new CPAP in 2018 And this is working well, no problems with mask or pressure    Med history -  Meds -12/2012 -Added spiriva - self dc'd -Does not feel he needs it, thought it caused leg swelling. 06/2013 -Breo in place of Symbicort       2017 >>anoro did not work        We reviewed his prior CT chest that showed bibasal bronchiectasis and echo  Significant tests/ events  Spirometry 2011- severe airway obstruction with a low ratio &FEV1 of 25 %, good effort !  02/2017 Spirometry - FEV1 of 41%, ratio 60, FVC 51%    Admitted Feb 2015 -for acute hypoxic respiratory failure and AKI/metabolic acidosis in the setting of RSV bronchitis.  CT chest showed bilateral lower lobe bronchiectasis.  2-D echo showed grade 1 diastolic dysfunction with preserved EF.  Had a low titer c-ANCA , Positive  Seen by ID , HIV neg.  Histoplasma ag positive in urine , but serology neg . (lived in [place] 10 yrs ago)   03/2007 CPAP 14 cm   Review of Systems neg for any significant sore throat, dysphagia, itching, sneezing, nasal congestion or excess/ purulent secretions, fever, chills, sweats, unintended wt loss, pleuritic or exertional cp, hempoptysis, orthopnea pnd or change in chronic leg  swelling. Also denies presyncope, palpitations, heartburn, abdominal pain, nausea, vomiting, diarrhea or change in bowel or urinary habits, dysuria,hematuria, rash, arthralgias, visual complaints, headache, numbness weakness or ataxia.     Objective:   Physical Exam   Gen. Pleasant, obese, in no distress ENT - no lesions, no post nasal drip Neck: No JVD, no thyromegaly, no carotid bruits Lungs: no use of accessory muscles, no dullness to percussion, decreased without rales or rhonchi  Cardiovascular: Rhythm regular, heart sounds  normal, no murmurs or gallops, 2+ peripheral edema Musculoskeletal: No deformities, no cyanosis or clubbing , no tremors       Assessment & Plan:

## 2017-04-08 ENCOUNTER — Ambulatory Visit (HOSPITAL_COMMUNITY)
Admission: RE | Admit: 2017-04-08 | Discharge: 2017-04-08 | Disposition: A | Discharge status: Home / Self Care | Payer: Medicare Other | Source: Ambulatory Visit | Attending: Pulmonary Disease | Admitting: Pulmonary Disease

## 2017-04-08 ENCOUNTER — Encounter (HOSPITAL_COMMUNITY): Payer: Medicare Other

## 2017-04-08 DIAGNOSIS — I10 Essential (primary) hypertension: Secondary | ICD-10-CM

## 2017-04-08 DIAGNOSIS — I868 Varicose veins of other specified sites: Secondary | ICD-10-CM | POA: Insufficient documentation

## 2017-04-08 DIAGNOSIS — I77810 Thoracic aortic ectasia: Secondary | ICD-10-CM | POA: Diagnosis not present

## 2017-04-08 DIAGNOSIS — I2781 Cor pulmonale (chronic): Secondary | ICD-10-CM | POA: Diagnosis not present

## 2017-04-08 DIAGNOSIS — G4733 Obstructive sleep apnea (adult) (pediatric): Secondary | ICD-10-CM | POA: Diagnosis not present

## 2017-04-08 DIAGNOSIS — Z6841 Body Mass Index (BMI) 40.0 and over, adult: Secondary | ICD-10-CM | POA: Insufficient documentation

## 2017-04-08 DIAGNOSIS — E119 Type 2 diabetes mellitus without complications: Secondary | ICD-10-CM | POA: Diagnosis not present

## 2017-04-08 DIAGNOSIS — I119 Hypertensive heart disease without heart failure: Secondary | ICD-10-CM | POA: Insufficient documentation

## 2017-04-08 DIAGNOSIS — J449 Chronic obstructive pulmonary disease, unspecified: Secondary | ICD-10-CM | POA: Diagnosis not present

## 2017-04-08 LAB — ECHOCARDIOGRAM COMPLETE
CHL CUP DOP CALC LVOT VTI: 26.7 cm
CHL CUP MV DEC (S): 215
CHL CUP STROKE VOLUME: 53 mL
EERAT: 12.82
EWDT: 215 ms
FS: 33 % (ref 28–44)
IVS/LV PW RATIO, ED: 0.97
LA diam end sys: 36 mm
LA vol A4C: 56.4 ml
LA vol: 53.7 mL
LADIAMINDEX: 1.15 cm/m2
LASIZE: 36 mm
LAVOLIN: 17.1 mL/m2
LV E/e' medial: 12.82
LV PW d: 11.8 mm — AB (ref 0.6–1.1)
LV dias vol index: 26 mL/m2
LV sys vol: 29 mL (ref 21–61)
LVDIAVOL: 82 mL (ref 62–150)
LVEEAVG: 12.82
LVELAT: 8.27 cm/s
LVOT area: 3.46 cm2
LVOT peak grad rest: 6 mmHg
LVOTD: 21 mm
LVOTPV: 121 cm/s
LVOTSV: 92 mL
LVSYSVOLIN: 9 mL/m2
Lateral S' vel: 15.2 cm/s
MV Peak grad: 4 mmHg
MV pk A vel: 102 m/s
MV pk E vel: 106 m/s
RV TAPSE: 26.7 mm
Simpson's disk: 64
TDI e' lateral: 8.27
TDI e' medial: 7.07

## 2017-04-08 NOTE — Progress Notes (Signed)
*  PRELIMINARY RESULTS* Echocardiogram 2D Echocardiogram has been performed.  Kenneth Patrick 04/08/2017, 1:30 PM

## 2017-04-08 NOTE — Telephone Encounter (Signed)
RA  Please Advise-  Clerical staff sent [place] a message and stated that you wanted this pt to follow up in two weeks and we have no open appts with you or an NP.

## 2017-04-11 NOTE — Telephone Encounter (Signed)
Pl get first available with NP

## 2017-04-12 NOTE — Telephone Encounter (Signed)
lmtcb x1 for pt. 

## 2017-04-12 NOTE — Telephone Encounter (Signed)
Patient retuning phone call. Per message, scheduled patient with SG on 05/19/17 at 3:30-AJ

## 2017-04-15 ENCOUNTER — Encounter: Payer: Self-pay | Admitting: "Endocrinology

## 2017-04-15 ENCOUNTER — Ambulatory Visit (INDEPENDENT_AMBULATORY_CARE_PROVIDER_SITE_OTHER): Payer: Medicare Other | Admitting: "Endocrinology

## 2017-04-15 VITALS — BP 138/70 | HR 94 | Ht 76.0 in | Wt 389.0 lb

## 2017-04-15 DIAGNOSIS — I10 Essential (primary) hypertension: Secondary | ICD-10-CM

## 2017-04-15 DIAGNOSIS — E1165 Type 2 diabetes mellitus with hyperglycemia: Secondary | ICD-10-CM | POA: Diagnosis not present

## 2017-04-15 DIAGNOSIS — Z794 Long term (current) use of insulin: Secondary | ICD-10-CM

## 2017-04-15 DIAGNOSIS — E118 Type 2 diabetes mellitus with unspecified complications: Secondary | ICD-10-CM

## 2017-04-15 DIAGNOSIS — E782 Mixed hyperlipidemia: Secondary | ICD-10-CM

## 2017-04-15 DIAGNOSIS — IMO0002 Reserved for concepts with insufficient information to code with codable children: Secondary | ICD-10-CM

## 2017-04-15 NOTE — Patient Instructions (Signed)

## 2017-04-15 NOTE — Progress Notes (Signed)
Subjective:    Patient ID: Kenneth Patrick, male    DOB: 1950-12-23, PCP [name], MD   Past Medical History:  Diagnosis Date  . Asthma   . Depression   . Diabetic neuropathy ([place])   . DM2 (diabetes mellitus, type 2) ([place])   . Dyspnea   . Hypercholesterolemia   . Low back pain   . Respiratory infection   . Sleep apnea    Past Surgical History:  Procedure Laterality Date  . CHOLECYSTECTOMY    . left eye enucleation following trauma     Social History   Social History  . Marital status: Married    Spouse name: N/A  . Number of children: N/A  . Years of education: N/A   Occupational History  . retired Engineer, structural    Social History Main Topics  . Smoking status: Former Smoker    Packs/day: 1.50    Years: 20.00    Quit date: 10/05/2004  . Smokeless tobacco: Never Used  . Alcohol use No  . Drug use: No     Comment: stopped smoking marijuana in 2006  . Sexual activity: Not Asked   Other Topics Concern  . None   Social History Narrative  . None   Outpatient Encounter Prescriptions as of 04/15/2017  Medication Sig  . albuterol (VENTOLIN HFA) 108 (90 BASE) MCG/ACT inhaler Inhale 2 puffs into the lungs every 6 (six) hours as needed.    [name] atorvastatin (LIPITOR) 80 MG tablet Take 80 mg by mouth At bedtime.   [name] BREO ELLIPTA 100-25 MCG/INH AEPB USE 1 INHALATION DAILY  . furosemide (LASIX) 40 MG tablet Take 40 mg by mouth daily as needed for fluid.   [name] gabapentin (NEURONTIN) 300 MG capsule Take 300 mg by mouth 2 (two) times daily.   . Insulin Glargine (BASAGLAR KWIKPEN) 100 UNIT/ML SOPN INJECT SUBCUTANEOUSLY 90  UNITS AT BEDTIME  . insulin lispro (HUMALOG) 100 UNIT/ML injection Use up to 78 units daily  . metFORMIN (GLUCOPHAGE) 500 MG tablet Take 1 tablet by mouth 2 (two) times daily.  Glory Rosebush VERIO test strip TEST AS DIRECTED 4 TIMES  DAILY  . oxymetazoline (AFRIN) 0.05 % nasal spray Place 1-2 sprays into the nose 2 (two) times daily as needed.   . potassium  chloride SA (KLOR-CON M20) 20 MEQ tablet Take 1 tablet (20 mEq total) by mouth once.  . sertraline (ZOLOFT) 100 MG tablet Take 200 mg by mouth daily.   . Tiotropium Bromide Monohydrate (SPIRIVA RESPIMAT) 2.5 MCG/ACT AERS Inhale 2 puffs into the lungs daily.  [name] VICTOZA 18 MG/3ML SOLN Inject 1.8 Units into the skin daily.   . [DISCONTINUED] Tiotropium Bromide Monohydrate (SPIRIVA RESPIMAT) 2.5 MCG/ACT AERS Inhale 2 puffs into the lungs daily.   No facility-administered encounter medications on file as of 04/15/2017.    ALLERGIES: Allergies  Allergen Reactions  . Penicillins     Childhood allergy    VACCINATION STATUS: Immunization History  Administered Date(s) Administered  . Influenza Split 10/23/2011  . Influenza Whole 09/04/2010  . Influenza,inj,Quad PF,36+ Mos 06/08/2013, 09/03/2014, 07/31/2015, 06/22/2016  . Pneumococcal Conjugate-13 03/05/2015  . Pneumococcal Polysaccharide-23 05/14/2011, 11/27/2013    Diabetes  He presents for his follow-up diabetic visit. He has type 2 diabetes mellitus. Onset time: He was diagnosed at approximate age of 60 years. His disease course has been improving. There are no hypoglycemic associated symptoms. Pertinent negatives for hypoglycemia include no confusion, headaches, pallor or seizures. There are no  diabetic associated symptoms. Pertinent negatives for diabetes include no chest pain, no fatigue, no polydipsia, no polyphagia, no polyuria and no weakness. There are no hypoglycemic complications. Symptoms are improving. There are no diabetic complications. Risk factors for coronary artery disease include diabetes mellitus, dyslipidemia, hypertension, male sex, obesity, sedentary lifestyle and tobacco exposure. Current diabetic treatment includes insulin injections. He is compliant with treatment some of the time. His weight is increasing steadily. He is following a generally unhealthy diet. When asked about meal planning, he reported none. He has had a  previous visit with a dietitian. He never participates in exercise. His home blood glucose trend is increasing steadily. His breakfast blood glucose range is generally 140-180 mg/dl. His lunch blood glucose range is generally 140-180 mg/dl. His dinner blood glucose range is generally 180-200 mg/dl. His overall blood glucose range is 180-200 mg/dl. (He did not bring his logs, his meter shows average blood glucose of 200-216.) Eye exam is current.  Hyperlipidemia  This is a chronic problem. The current episode started more than 1 year ago. Pertinent negatives include no chest pain, myalgias or shortness of breath. Current antihyperlipidemic treatment includes statins. Compliance problems include adherence to diet and adherence to exercise.  Risk factors for coronary artery disease include dyslipidemia, diabetes mellitus, obesity, male sex and a sedentary lifestyle.  Hypertension  This is a chronic problem. The current episode started more than 1 year ago. Pertinent negatives include no chest pain, headaches, neck pain, palpitations or shortness of breath. Risk factors for coronary artery disease include diabetes mellitus, dyslipidemia, male gender, obesity, smoking/tobacco exposure and sedentary lifestyle. Compliance problems include diet and exercise.      Review of Systems  Constitutional: Negative for chills, fatigue, fever and unexpected weight change.  HENT: Negative for dental problem, mouth sores and trouble swallowing.   Eyes: Negative for visual disturbance.  Respiratory: Negative for cough, choking, chest tightness, shortness of breath and wheezing.   Cardiovascular: Negative for chest pain, palpitations and leg swelling.  Gastrointestinal: Negative for abdominal distention, abdominal pain, constipation, diarrhea, nausea and vomiting.  Endocrine: Negative for polydipsia, polyphagia and polyuria.  Genitourinary: Negative for dysuria, flank pain, hematuria and urgency.  Musculoskeletal:  Negative for back pain, gait problem, myalgias and neck pain.  Skin: Negative for pallor, rash and wound.  Neurological: Negative for seizures, syncope, weakness, numbness and headaches.  Psychiatric/Behavioral: Negative.  Negative for confusion and dysphoric mood.    Objective:    BP 138/70   Pulse 94   Ht 6\' 4"  (1.93 m)   Wt (!) 389 lb (176.4 kg)   BMI 47.35 kg/m   Wt Readings from Last 3 Encounters:  04/15/17 (!) 389 lb (176.4 kg)  04/06/17 (!) 385 lb 9.6 oz (174.9 kg)  02/10/17 (!) 380 lb 9.6 oz (172.6 kg)    Physical Exam  Constitutional: He is oriented to person, place, and time. He appears well-developed and well-nourished. He is cooperative. No distress.  HENT:  Head: Normocephalic and atraumatic.  Neck: Normal range of motion. Neck supple. No tracheal deviation present. No thyromegaly present.  Cardiovascular: Normal rate, S1 normal, S2 normal and normal heart sounds.  Exam reveals no gallop.   No murmur heard. Pulses:      Dorsalis pedis pulses are 1+ on the right side, and 1+ on the left side.       Posterior tibial pulses are 1+ on the right side, and 1+ on the left side.  Pulmonary/Chest: Breath sounds normal. No respiratory distress. He  has no wheezes.  Abdominal: Soft. Bowel sounds are normal. He exhibits no distension. There is no tenderness. There is no guarding and no CVA tenderness.  Musculoskeletal: He exhibits no edema.       Right shoulder: He exhibits no swelling and no deformity.  Neurological: He is alert and oriented to person, place, and time. He has normal strength and normal reflexes. No cranial nerve deficit or sensory deficit. Gait normal.  Skin: Skin is warm and dry. No rash noted. No cyanosis. Nails show no clubbing.  Psychiatric: He has a normal mood and affect. His speech is normal and behavior is normal. Judgment and thought content normal. Cognition and memory are normal.    Diabetic Labs (most recent): Lab Results  Component Value Date    HGBA1C 7.8 (H) 04/05/2017   HGBA1C 8.0 (H) 01/06/2017   HGBA1C 7.7 (H) 09/30/2016   Recent Results (from the past 2160 hour(s))  Basic metabolic panel     Status: Abnormal   Collection Time: 02/08/17  3:33 PM  Result Value Ref Range   Sodium 140 135 - 145 mEq/L   Potassium 4.7 3.5 - 5.1 mEq/L   Chloride 102 96 - 112 mEq/L   CO2 30 19 - 32 mEq/L   Glucose, Bld 243 (H) 70 - 99 mg/dL   BUN 26 (H) 6 - 23 mg/dL   Creatinine, Ser 1.38 0.40 - 1.50 mg/dL   Calcium 10.2 8.4 - 10.5 mg/dL   GFR 54.86 (L) >60.00 mL/min  Nitric oxide     Status: None   Collection Time: 02/10/17  3:15 PM  Result Value Ref Range   Nitric Oxide 13   CBC w/Diff     Status: Abnormal   Collection Time: 02/10/17  4:06 PM  Result Value Ref Range   WBC 6.5 4.0 - 10.5 K/uL   RBC 4.27 4.22 - 5.81 Mil/uL   Hemoglobin 12.0 (L) 13.0 - 17.0 g/dL   HCT 36.2 (L) 39.0 - 52.0 %   MCV 84.8 78.0 - 100.0 fl   MCHC 33.1 30.0 - 36.0 g/dL   RDW 15.3 11.5 - 15.5 %   Platelets 261.0 150.0 - 400.0 K/uL   Neutrophils Relative % 53.3 43.0 - 77.0 %   Lymphocytes Relative 29.6 12.0 - 46.0 %   Monocytes Relative 10.3 3.0 - 12.0 %   Eosinophils Relative 5.8 (H) 0.0 - 5.0 %   Basophils Relative 1.0 0.0 - 3.0 %   Neutro Abs 3.5 1.4 - 7.7 K/uL   Lymphs Abs 1.9 0.7 - 4.0 K/uL   Monocytes Absolute 0.7 0.1 - 1.0 K/uL   Eosinophils Absolute 0.4 0.0 - 0.7 K/uL   Basophils Absolute 0.1 0.0 - 0.1 K/uL  Basic Metabolic Panel (BMET)     Status: Abnormal   Collection Time: 02/10/17  4:06 PM  Result Value Ref Range   Sodium 138 135 - 145 mEq/L   Potassium 4.1 3.5 - 5.1 mEq/L   Chloride 102 96 - 112 mEq/L   CO2 30 19 - 32 mEq/L   Glucose, Bld 207 (H) 70 - 99 mg/dL   BUN 29 (H) 6 - 23 mg/dL   Creatinine, Ser 1.41 0.40 - 1.50 mg/dL   Calcium 9.0 8.4 - 10.5 mg/dL   GFR 53.52 (L) >60.00 mL/min  B Nat Peptide     Status: None   Collection Time: 02/10/17  4:06 PM  Result Value Ref Range   Pro B Natriuretic peptide (BNP) 16.0 0.0 - 100.0 pg/mL  Comprehensive metabolic panel     Status: Abnormal   Collection Time: 04/05/17  3:33 PM  Result Value Ref Range   Glucose 237 (H) 65 - 99 mg/dL   BUN 17 8 - 27 mg/dL   Creatinine, Ser 1.25 0.76 - 1.27 mg/dL   GFR calc non Af Amer 60 >59 mL/min/1.73   GFR calc Af Amer 69 >59 mL/min/1.73   BUN/Creatinine Ratio 14 10 - 24   Sodium 139 134 - 144 mmol/L   Potassium 5.2 3.5 - 5.2 mmol/L   Chloride 102 96 - 106 mmol/L   CO2 22 20 - 29 mmol/L    Comment:               **Please note reference interval change**   Calcium 9.2 8.6 - 10.2 mg/dL   Total Protein 6.3 6.0 - 8.5 g/dL   Albumin 4.3 3.6 - 4.8 g/dL   Globulin, Total 2.0 1.5 - 4.5 g/dL   Albumin/Globulin Ratio 2.2 1.2 - 2.2   Bilirubin Total 1.2 0.0 - 1.2 mg/dL   Alkaline Phosphatase 123 (H) 39 - 117 IU/L   AST 52 (H) 0 - 40 IU/L   ALT 39 0 - 44 IU/L  Hgb A1c w/o eAG     Status: Abnormal   Collection Time: 04/05/17  3:33 PM  Result Value Ref Range   Hgb A1c MFr Bld 7.8 (H) 4.8 - 5.6 %    Comment:          Pre-diabetes: 5.7 - 6.4          Diabetes: >6.4          Glycemic control for adults with diabetes: <7.0   ECHOCARDIOGRAM COMPLETE     Status: Abnormal   Collection Time: 04/08/17  1:30 PM  Result Value Ref Range   LV PW d 11.8 (A) 0.6 - 1.1 mm   FS 33 28 - 44 %   LA vol 53.7 mL   LA ID, A-P, ES 36 mm   IVS/LV PW RATIO, ED .97    Stroke v 53 ml   LVOT VTI 26.7 cm   LV e' LATERAL 8.27 cm/s   LV E/e' medial 12.82    LV E/e'average 12.82    LA diam index 1.15 cm/m2   LA vol A4C 56.4 ml   LVOT peak grad rest 6 mmHg   E decel time 215 msec   LVOT diameter 21 mm   LVOT area 3.46 cm2   LVOT peak vel 121 cm/s   LVOT SV 92.00 mL   Peak grad 4 mmHg   E/e' ratio 12.82    MV pk E vel 106 m/s   MV pk A vel 102 m/s   LV sys vol 29 21 - 61 mL   LV sys vol index 9.0 mL/m2   LV dias vol 82 62 - 150 mL   LV dias vol index 26.0 mL/m2   LA vol index 17.1 mL/m2   MV Dec 215    LA diam end sys 36.00 mm   Simpson's disk 64.00     TDI e' medial 7.07    TDI e' lateral 8.27    Lateral S' vel 15.20 cm/sec   TAPSE 26.70 mm    Assessment & Plan:   1. Uncontrolled type 2 diabetes mellitus with complication, with long-term current use of insulin ([place])  - his diabetes is  complicated by morbid obesity and sleep apnea and patient remains at a high risk for  more acute and chronic complications of diabetes which include CAD, CVA, CKD, retinopathy, and neuropathy. These are all discussed in detail with the patient.  Patient came With improved fasting but still above target postprandial glucose profile, A1c stable at 7.8%.   -  Glucose logs and insulin administration records pertaining to this visit,  to be scanned into patient's records.  Recent labs reviewed.   - I have re-counseled the patient on diet management and weight loss  by adopting a carbohydrate restricted / protein rich  Diet.  - Suggestion is made for patient to avoid simple carbohydrates   from his diet including Cakes , Desserts, Ice Cream,  Soda (  diet and regular) , Sweet Tea , Candies,  Chips, Cookies, Artificial Sweeteners,   and "Sugar-free" Products .  This will help patient to have stable blood glucose profile and potentially avoid unintended  Weight gain.  - Patient is advised to stick to a routine mealtimes to eat 3 meals  a day and avoid unnecessary snacks ( to snack only to correct hypoglycemia).   - I have approached patient with the following individualized plan to manage diabetes and patient agrees.  - Continue basal insulin Basaglar 90 units qhs, and  continue Humalog  20 units TIDAC DAC for pre-meal BG readings of 90-150mg /dl, plus patient specific correction dose of rapid acting insulin for unexpected hyperglycemia above 150mg /dl, associated with strict monitoring of BG AC and HS.  -Adjustment parameters for hypo and hyperglycemia were given in a written document to patient. -Patient is encouraged to call clinic for blood glucose levels  less than 70 or above 300 mg /dl. -He will continue on Victoza 1.8 mg subcutaneous daily. -I will continue metformin 500mg  po BID, did not tolerate Invokana.   - Patient specific target  for A1c; LDL, HDL, Triglycerides, and  Waist Circumference were discussed in detail.  2) BP/HTN : controlled. Continue current medications. 3) Lipids/HPL:  continue statins. 4)  Weight/Diet:  No success in weight loss, admits to dietary indiscretion. CDE consult in progress, exercise, and carbohydrates information provided.  5) Chronic Care/Health Maintenance:  -Patient is on ACEI/ARB and Statin medications and encouraged to continue to follow up with Ophthalmology, Podiatrist at least yearly or according to recommendations, and advised to  stay away from smoking. I have recommended yearly flu vaccine and pneumonia vaccination at least every 5 years; moderate intensity exercise for up to 150 minutes weekly; and  sleep for at least 7 hours a day.  - 25 minutes of time was spent on the care of this patient , 50% of which was applied for counseling on diabetes complications and their preventions.  - I advised patient to maintain close follow up with [name], MD for primary care needs.  Patient is asked to bring meter and  blood glucose logs during his next visit.   Follow up plan: -Return in about 3 months (around 07/16/2017) for meter, and logs.  Glade Lloyd, MD Phone: [phone]  Fax: [phone]   04/15/2017, 3:05 PM

## 2017-04-16 ENCOUNTER — Telehealth: Payer: Self-pay | Admitting: Pulmonary Disease

## 2017-04-16 ENCOUNTER — Ambulatory Visit (HOSPITAL_COMMUNITY)
Admission: RE | Admit: 2017-04-16 | Discharge: 2017-04-16 | Disposition: A | Discharge status: Home / Self Care | Payer: Medicare Other | Source: Ambulatory Visit | Attending: Pulmonary Disease | Admitting: Pulmonary Disease

## 2017-04-16 DIAGNOSIS — R0600 Dyspnea, unspecified: Secondary | ICD-10-CM

## 2017-04-16 DIAGNOSIS — I2510 Atherosclerotic heart disease of native coronary artery without angina pectoris: Secondary | ICD-10-CM | POA: Insufficient documentation

## 2017-04-16 DIAGNOSIS — I313 Pericardial effusion (noninflammatory): Secondary | ICD-10-CM | POA: Insufficient documentation

## 2017-04-16 DIAGNOSIS — I2781 Cor pulmonale (chronic): Secondary | ICD-10-CM

## 2017-04-16 DIAGNOSIS — R0602 Shortness of breath: Secondary | ICD-10-CM | POA: Diagnosis not present

## 2017-04-16 DIAGNOSIS — R0609 Other forms of dyspnea: Secondary | ICD-10-CM

## 2017-04-16 IMAGING — CT CT ANGIO CHEST
2 of 6 series · 19 of 46 positions shown · IV contrast (Isovue)
Comparison: [DATE]

CLINICAL DATA: Shortness of breath, COPD.

EXAM:
CT ANGIOGRAPHY CHEST WITH CONTRAST
TECHNIQUE: Multidetector CT imaging of the chest was performed using the
standard protocol during bolus administration of intravenous
contrast. Multiplanar CT image reconstructions and MIPs were
obtained to evaluate the vascular anatomy.
CONTRAST:  125 cc Isovue 370 IV

[Series 6: thins · axial · 0.91mm/px · z∈[+1256,+1569]mm · 16 of 343 slices shown]
[im 15/343  lung]
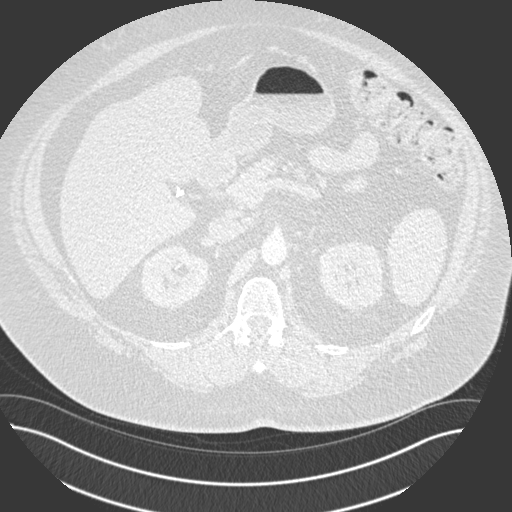
[im 45/343  soft-tissue]
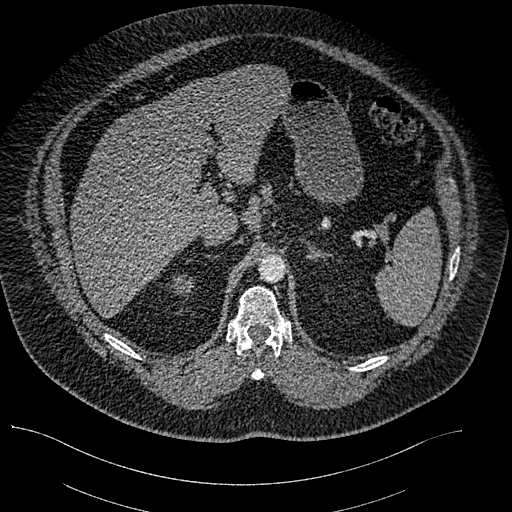
[im 60/343  lung]
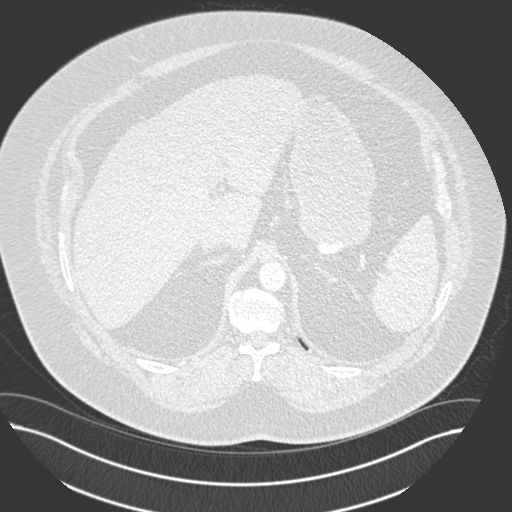
[im 75/343  soft-tissue]
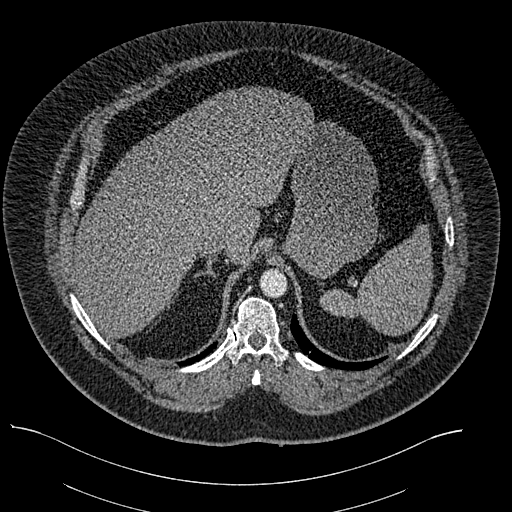
[im 105/343  lung]
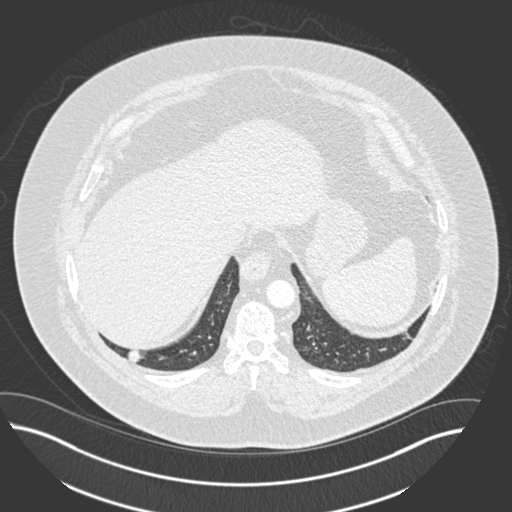
[im 119/343  soft-tissue]
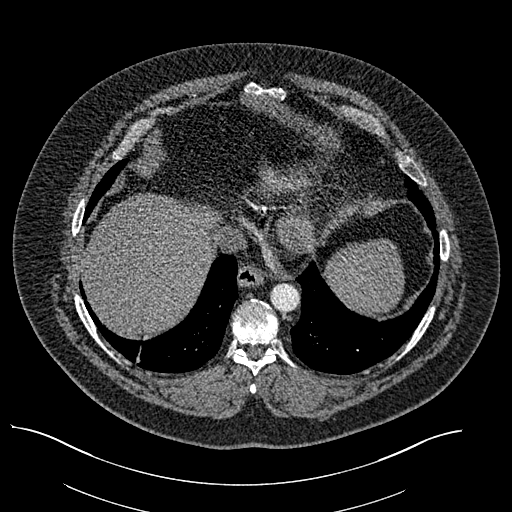
[im 134/343  lung]
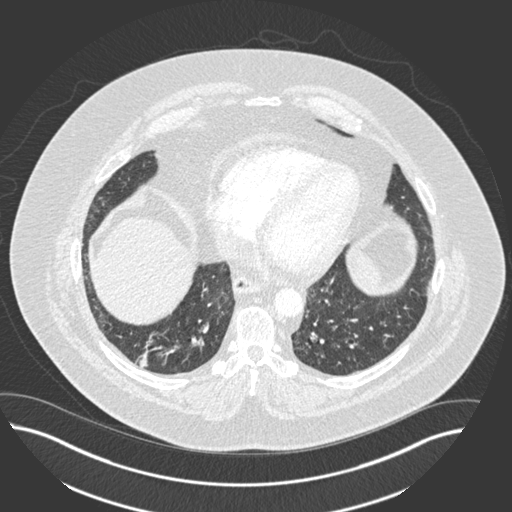
[im 164/343  soft-tissue]
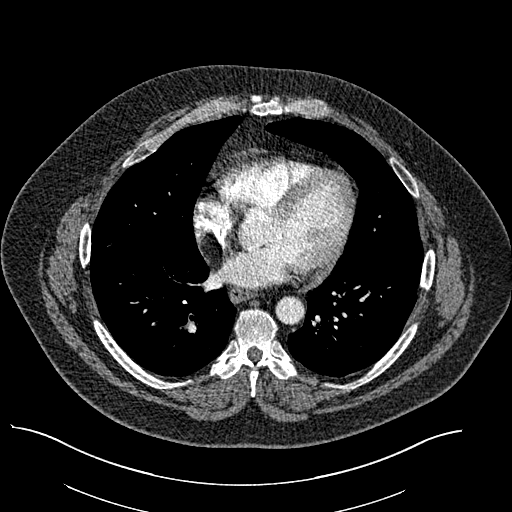
[im 179/343  lung]
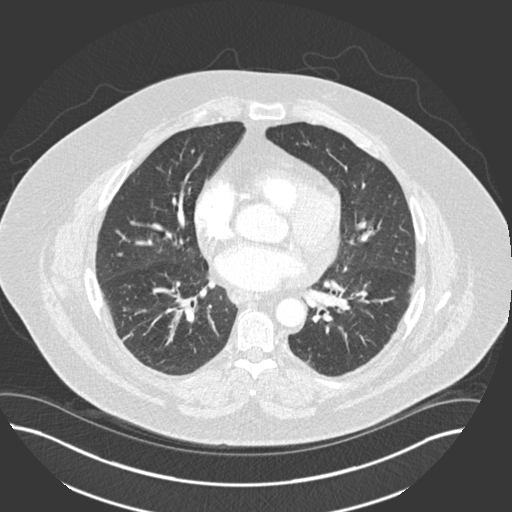
[im 209/343  soft-tissue]
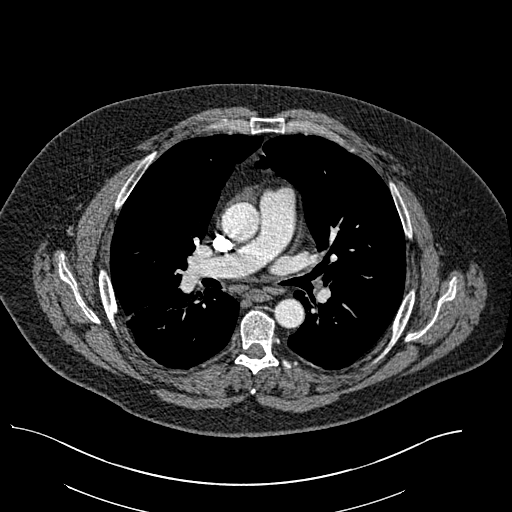
[im 224/343  lung]
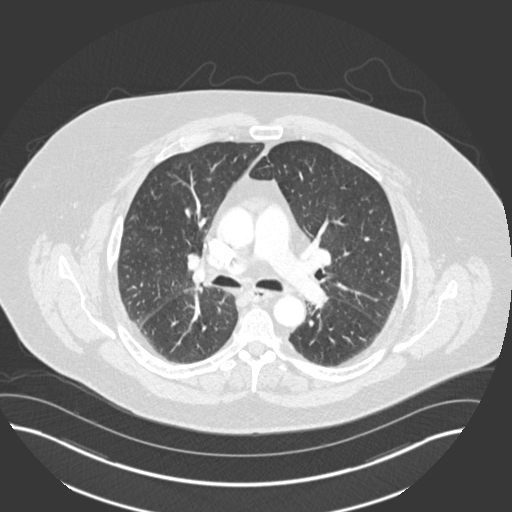
[im 238/343  soft-tissue]
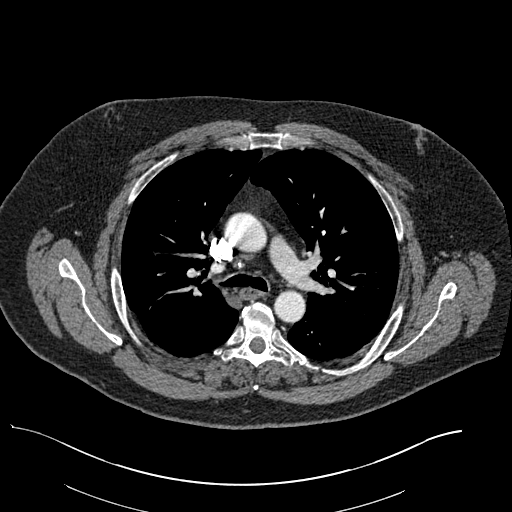
[im 268/343  lung]
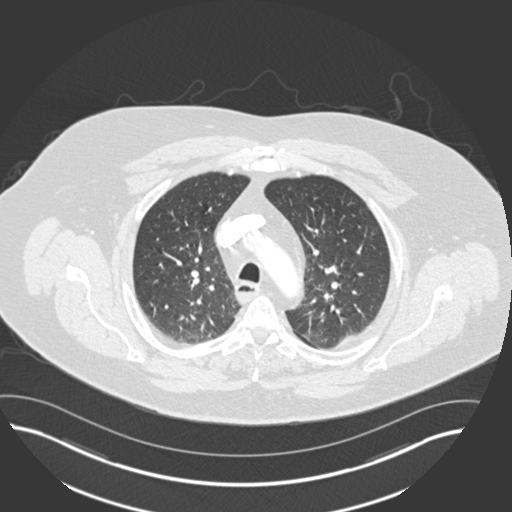
[im 283/343  soft-tissue]
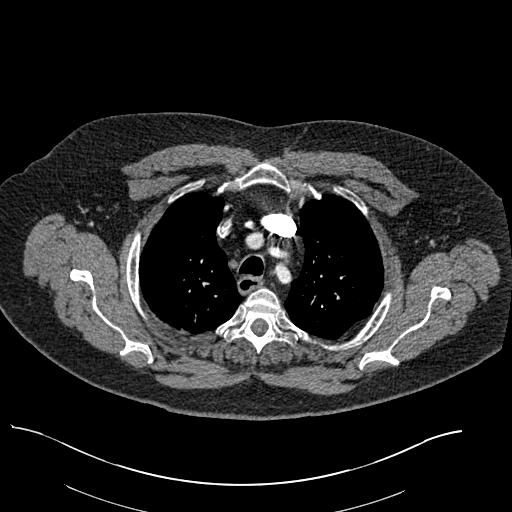
[im 298/343  lung]
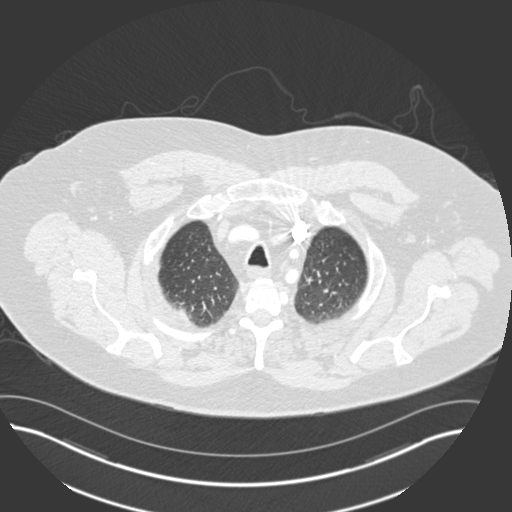
[im 328/343  soft-tissue]
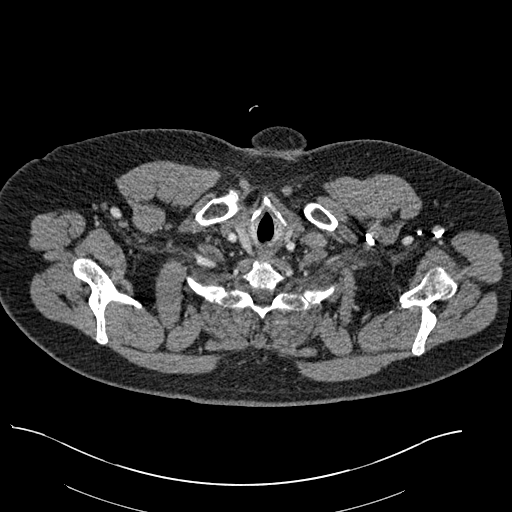

[Series 8: coronal mpr · coronal · 0.72mm/px · 3 of 163 slices shown]
[im 41/163  soft-tissue]
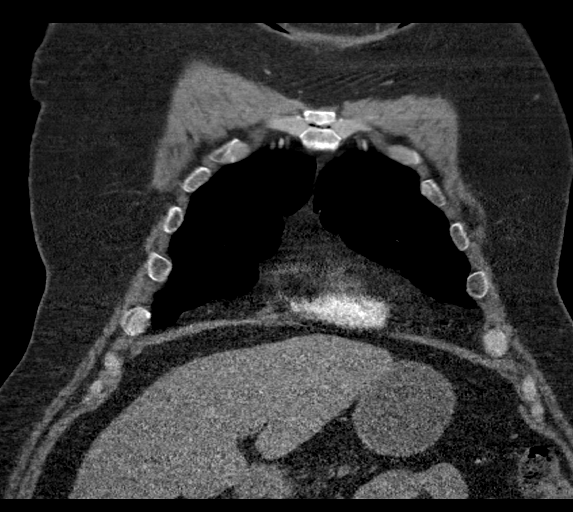
[im 82/163  soft-tissue]
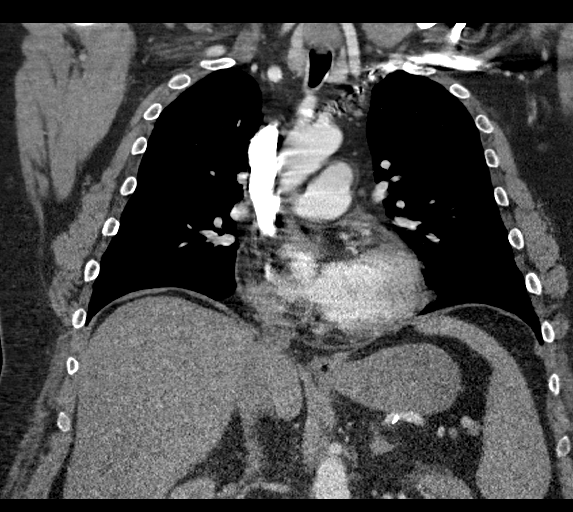
[im 122/163  soft-tissue]
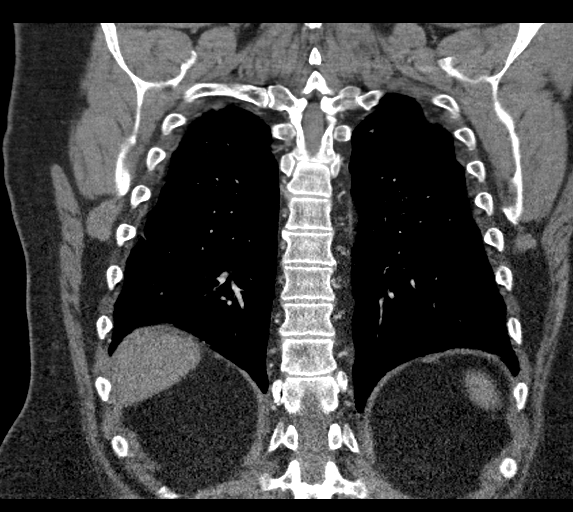

[19 of 46 positions shown; findings below may reference images not displayed]

FINDINGS: Cardiovascular: Heart is normal size. Small pericardial effusion,
similar to prior study. Coronary artery calcifications in the 3
major coronary vessels proximally. Aorta is normal caliber. No
dissection. No filling defects in the pulmonary arteries to suggest
pulmonary emboli.

Mediastinum/Nodes: No mediastinal, hilar, or axillary adenopathy.

Lungs/Pleura: Linear scarring in the lung bases. No confluent
opacities, effusions or suspicious nodules.

Upper Abdomen: Imaging into the upper abdomen shows no acute
findings.

Musculoskeletal: Chest wall soft tissues are unremarkable. No acute
bony abnormality.

Review of the MIP images confirms the above findings.
IMPRESSION: No evidence of pulmonary embolus.

Coronary artery disease.

Small pericardial effusion, stable since [DATE].

## 2017-04-16 MED ORDER — IOPAMIDOL (ISOVUE-370) INJECTION 76%
125.0000 mL | Freq: Once | INTRAVENOUS | Status: AC | PRN
  Administered 2017-04-16: 125 mL via INTRAVENOUS

## 2017-04-16 NOTE — Telephone Encounter (Signed)
Received call from [place] with scheduling at [facility]. She is requesting the CTa order that was placed be changed to STAT d/t the associated diagnosis. New order placed. Kenneth Patrick verbalized understanding and denied any further questions or concerns at this time.

## 2017-04-26 ENCOUNTER — Telehealth: Payer: Self-pay | Admitting: Pulmonary Disease

## 2017-04-26 NOTE — Telephone Encounter (Signed)
Notes recorded by Kenneth Noel, MD on 04/19/2017 at 6:19 PM EDT No evidence of blood clot in lungs. Changes of COPD as before Small amount of fluid around the heart also noted in 2015- continue diuretics  Spoke with patient. He is aware of RA's recs. Verbalized understanding. Nothing further needed at time of call.

## 2017-05-16 ENCOUNTER — Other Ambulatory Visit: Payer: Self-pay | Admitting: "Endocrinology

## 2017-05-19 ENCOUNTER — Encounter: Payer: Self-pay | Admitting: Acute Care

## 2017-05-19 ENCOUNTER — Ambulatory Visit (INDEPENDENT_AMBULATORY_CARE_PROVIDER_SITE_OTHER): Payer: Medicare Other | Admitting: Acute Care

## 2017-05-19 DIAGNOSIS — J449 Chronic obstructive pulmonary disease, unspecified: Secondary | ICD-10-CM | POA: Diagnosis not present

## 2017-05-19 DIAGNOSIS — G4733 Obstructive sleep apnea (adult) (pediatric): Secondary | ICD-10-CM

## 2017-05-19 NOTE — Assessment & Plan Note (Addendum)
Excellent compliance on CPAP AHI= 0.5 Plan Continue on CPAP at bedtime. You appear to be benefiting from the treatment Goal is to wear for at least 4-6 hours each night for maximal clinical benefit. Continue to work on weight loss, as the link between excess weight  and sleep apnea is well established.  Do not drive if sleepy. Clean filter, reservoir, tubing and mask once weekly with soapy water. Follow up with Dr. Elsworth Soho or NP In  6 months or before as needed.  Please contact office for sooner follow up if symptoms do not improve or worsen or seek emergency care

## 2017-05-19 NOTE — Patient Instructions (Addendum)
It is nice to meet you today  Continue using Breo once daily as you have been doing. Continue using Spiriva daily as you have been doing. Rinse mouth after use. Continue on CPAP at bedtime. You appear to be benefiting from the treatment Goal is to wear for at least 4-6 hours each night for maximal clinical benefit. Continue to work on weight loss, as the link between excess weight  and sleep apnea is well established.  Do not drive if sleepy. Clean filter, reservoir, tubing and mask once weekly with soapy water. Follow up with Dr. Elsworth Soho or NP In  6 months or before as needed.  Please contact office for sooner follow up if symptoms do not improve or worsen or seek emergency care

## 2017-05-19 NOTE — Assessment & Plan Note (Signed)
Stable on current medical regimen Plan Continue using Breo once daily as you have been doing. Continue using Spiriva daily as you have been doing. Rinse mouth after use. Follow up with Dr. Elsworth Soho or NP In  6 months or before as needed.  Please contact office for sooner follow up if symptoms do not improve or worsen or seek emergency care

## 2017-05-19 NOTE — Progress Notes (Signed)
History of Present Illness Kenneth Patrick is a 66 y.o. male former heavy smoker with OSA, and COPD. He also has a remote history of asthma.He is followed by Dr. Elsworth Soho.   05/19/2017 Follow up for OSA: Pt. Presents for follow up. He states he is doing well on his CPAP. He had a CTA done 04/16/2017 for worsening dyspnea which was negative for blood clots. He has been compliant with the lasix Dr. Elsworth Soho prescribed. His PCP is following labwork.The patient states he compliant with his CPAP. Compliance is 100%. He states he has had no issues with his new machine.He has less daytime sleepiness, no awakening with headache.He is more focused during the day. He states he cannot sleep without the device. He states he does not need any additional equipment.He states he has no complaints. He does not need additional equipment. He denies fever, chest pain, orthopnea, or hemoptysis.  Test Results: Download 04-2017 through 05/04/2017 Air Sense 10 CPAP Set pressure 14 cm H2O Usage days 30/30 or 100% Greater than 4 hours 30 days or 100% Less than 4 hours 0 days are 0% Average daily use of 7 hours 21 minutes AHI equals 0.5   CBC Latest Ref Rng & Units 02/10/2017 12/28/2013 11/29/2013  WBC 4.0 - 10.5 K/uL 6.5 6.5 13.3(H)  Hemoglobin 13.0 - 17.0 g/dL 12.0(L) 11.4(L) 9.9(L)  Hematocrit 39.0 - 52.0 % 36.2(L) 34.3(L) 30.2(L)  Platelets 150.0 - 400.0 K/uL 261.0 258 324    BMP Latest Ref Rng & Units 04/05/2017 02/10/2017 02/08/2017  Glucose 65 - 99 mg/dL 237(H) 207(H) 243(H)  BUN 8 - 27 mg/dL 17 29(H) 26(H)  Creatinine 0.76 - 1.27 mg/dL 1.25 1.41 1.38  BUN/Creat Ratio 10 - 24 14 - -  Sodium 134 - 144 mmol/L 139 138 140  Potassium 3.5 - 5.2 mmol/L 5.2 4.1 4.7  Chloride 96 - 106 mmol/L 102 102 102  CO2 20 - 29 mmol/L 22 30 30   Calcium 8.6 - 10.2 mg/dL 9.2 9.0 10.2     ProBNP    Component Value Date/Time   PROBNP 16.0 02/10/2017 1606      Past medical hx Past Medical History:  Diagnosis Date  . Asthma   .  Depression   . Diabetic neuropathy ([place])   . DM2 (diabetes mellitus, type 2) ([place])   . Dyspnea   . Hypercholesterolemia   . Low back pain   . Respiratory infection   . Sleep apnea      Social History  Substance Use Topics  . Smoking status: Former Smoker    Packs/day: 1.50    Years: 20.00    Quit date: 10/05/2004  . Smokeless tobacco: Never Used  . Alcohol use No    [name] reports that he quit smoking about 12 years ago. He has a 30.00 pack-year smoking history. He has never used smokeless tobacco. He reports that he does not drink alcohol or use drugs.  Tobacco Cessation: Former smoker quit 2006 with a 30-pack-year smoking history  Past surgical hx, Family hx, Social hx all reviewed.  Current Outpatient Prescriptions on File Prior to Visit  Medication Sig  . albuterol (VENTOLIN HFA) 108 (90 BASE) MCG/ACT inhaler Inhale 2 puffs into the lungs every 6 (six) hours as needed.    [name] atorvastatin (LIPITOR) 80 MG tablet Take 80 mg by mouth At bedtime.   [name] BREO ELLIPTA 100-25 MCG/INH AEPB USE 1 INHALATION DAILY  . furosemide (LASIX) 40 MG tablet Take 40 mg by mouth daily as  needed for fluid.   [name] gabapentin (NEURONTIN) 300 MG capsule Take 300 mg by mouth 2 (two) times daily.   [name] HUMALOG 100 UNIT/ML injection INJECT SUBCUTANEOUSLY UP TO 78 UNITS DAILY  . Insulin Glargine (BASAGLAR KWIKPEN) 100 UNIT/ML SOPN INJECT SUBCUTANEOUSLY 90  UNITS AT BEDTIME  . metFORMIN (GLUCOPHAGE) 500 MG tablet Take 1 tablet by mouth 2 (two) times daily.  Glory Rosebush VERIO test strip TEST AS DIRECTED 4 TIMES  DAILY  . oxymetazoline (AFRIN) 0.05 % nasal spray Place 1-2 sprays into the nose 2 (two) times daily as needed.   . sertraline (ZOLOFT) 100 MG tablet Take 200 mg by mouth daily.   [name] VICTOZA 18 MG/3ML SOLN Inject 1.8 Units into the skin daily.   . potassium chloride SA (KLOR-CON M20) 20 MEQ tablet Take 1 tablet (20 mEq total) by mouth once.  . Tiotropium Bromide Monohydrate (SPIRIVA RESPIMAT) 2.5 MCG/ACT  AERS Inhale 2 puffs into the lungs daily.   No current facility-administered medications on file prior to visit.      Allergies  Allergen Reactions  . Penicillins     Childhood allergy     Review Of Systems:  Constitutional:   No  weight loss, night sweats,  Fevers, chills, fatigue, or  lassitude.  HEENT:   No headaches,  Difficulty swallowing,  Tooth/dental problems, or  Sore throat,                No sneezing, itching, ear ache, nasal congestion, post nasal drip,   CV:  No chest pain,  Orthopnea, PND, swelling in lower extremities, anasarca, dizziness, palpitations, syncope.   GI  No heartburn, indigestion, abdominal pain, nausea, vomiting, diarrhea, change in bowel habits, loss of appetite, bloody stools.   Resp: No shortness of breath with exertion or at rest.  No excess mucus, no productive cough,  No non-productive cough,  No coughing up of blood.  No change in color of mucus.  No wheezing.  No chest wall deformity  Skin: no rash or lesions.  GU: no dysuria, change in color of urine, no urgency or frequency.  No flank pain, no hematuria   MS:  No joint pain or swelling.  No decreased range of motion.  No back pain.  Psych:  No change in mood or affect. No depression or anxiety.  No memory loss.   Vital Signs BP 130/78 (BP Location: Left Arm, Patient Position: Sitting, Cuff Size: Normal) Comment (BP Location): forearm  Pulse 91   Ht 6\' 4"  (1.93 m)   Wt (!) 391 lb 9.6 oz (177.6 kg)   SpO2 95%   BMI 47.67 kg/m    Physical Exam:  General- No distress,  A&Ox3, pleasant ENT: No sinus tenderness, TM clear, pale nasal mucosa, no oral exudate,no post nasal drip, no LAN Cardiac: S1, S2, regular rate and rhythm, no murmur Chest: No wheeze/ rales/ dullness; no accessory muscle use, no nasal flaring, no sternal retractions, breath sounds decreased bilaterally per bases Abd.: Soft Non-tender, nondistended, bowel sounds positive, obese Ext: No clubbing cyanosis, 1+  edema Neuro:  normal strength, deconditioned at baseline Skin: No rashes, warm and dry Psych: normal mood and behavior   Assessment/Plan  COPD (chronic obstructive pulmonary disease) Stable on current medical regimen Plan Continue using Breo once daily as you have been doing. Continue using Spiriva daily as you have been doing. Rinse mouth after use. Follow up with Dr. Elsworth Soho or NP In  6 months or before as needed.  Please contact  office for sooner follow up if symptoms do not improve or worsen or seek emergency care    OSA (obstructive sleep apnea) Excellent compliance on CPAP AHI= 0.5 Plan Continue on CPAP at bedtime. You appear to be benefiting from the treatment Goal is to wear for at least 4-6 hours each night for maximal clinical benefit. Continue to work on weight loss, as the link between excess weight  and sleep apnea is well established.  Do not drive if sleepy. Clean filter, reservoir, tubing and mask once weekly with soapy water. Follow up with Dr. Elsworth Soho or NP In  6 months or before as needed.  Please contact office for sooner follow up if symptoms do not improve or worsen or seek emergency care      Magdalen Spatz, NP 05/19/2017  9:33 PM

## 2017-06-12 ENCOUNTER — Other Ambulatory Visit: Payer: Self-pay | Admitting: Pulmonary Disease

## 2017-06-18 ENCOUNTER — Other Ambulatory Visit: Payer: Self-pay | Admitting: "Endocrinology

## 2017-06-25 ENCOUNTER — Other Ambulatory Visit: Payer: Self-pay | Admitting: Pulmonary Disease

## 2017-06-28 ENCOUNTER — Other Ambulatory Visit: Payer: Self-pay | Admitting: Adult Health

## 2017-06-28 ENCOUNTER — Other Ambulatory Visit: Payer: Self-pay

## 2017-06-28 ENCOUNTER — Encounter: Payer: Self-pay | Admitting: Pulmonary Disease

## 2017-06-28 MED ORDER — TIOTROPIUM BROMIDE MONOHYDRATE 2.5 MCG/ACT IN AERS: 2.0000 | INHALATION_SPRAY | Freq: Every day | RESPIRATORY_TRACT | 4 refills | Status: DC

## 2017-06-28 MED ORDER — POTASSIUM CHLORIDE CRYS ER 20 MEQ PO TBCR
20.0000 meq | EXTENDED_RELEASE_TABLET | Freq: Every day | ORAL | 1 refills | Status: DC
Start: 2017-06-28 — End: 2019-06-15

## 2017-07-12 DIAGNOSIS — E1165 Type 2 diabetes mellitus with hyperglycemia: Secondary | ICD-10-CM | POA: Diagnosis not present

## 2017-07-12 DIAGNOSIS — E118 Type 2 diabetes mellitus with unspecified complications: Secondary | ICD-10-CM | POA: Diagnosis not present

## 2017-07-13 LAB — LIPID PANEL W/O CHOL/HDL RATIO
Cholesterol, Total: 165 mg/dL (ref 100–199)
HDL: 46 mg/dL (ref >39)
LDL Calculated: 99 mg/dL (ref 0–99)
Triglycerides: 100 mg/dL (ref 0–149)
VLDL Cholesterol Cal: 20 mg/dL (ref 5–40)

## 2017-07-13 LAB — RENAL FUNCTION PANEL
Albumin: 4.3 g/dL (ref 3.6–4.8)
BUN / CREAT RATIO: 15 (ref 10–24)
BUN: 19 mg/dL (ref 8–27)
CO2: 19 mmol/L — AB (ref 20–29)
Calcium: 9.5 mg/dL (ref 8.6–10.2)
Chloride: 104 mmol/L (ref 96–106)
Creatinine, Ser: 1.3 mg/dL — ABNORMAL HIGH (ref 0.76–1.27)
GFR, EST AFRICAN AMERICAN: 66 mL/min/{1.73_m2} (ref >59)
GFR, EST NON AFRICAN AMERICAN: 57 mL/min/{1.73_m2} — AB (ref >59)
Glucose: 107 mg/dL — ABNORMAL HIGH (ref 65–99)
POTASSIUM: 4.7 mmol/L (ref 3.5–5.2)
Phosphorus: 3.3 mg/dL (ref 2.5–4.5)
SODIUM: 141 mmol/L (ref 134–144)

## 2017-07-13 LAB — HEMOGLOBIN A1C
Est. average glucose Bld gHb Est-mCnc: 166 mg/dL
HEMOGLOBIN A1C: 7.4 % — AB (ref 4.8–5.6)

## 2017-07-19 ENCOUNTER — Ambulatory Visit (INDEPENDENT_AMBULATORY_CARE_PROVIDER_SITE_OTHER): Payer: Medicare Other | Admitting: "Endocrinology

## 2017-07-19 ENCOUNTER — Encounter: Payer: Self-pay | Admitting: "Endocrinology

## 2017-07-19 VITALS — BP 135/73 | HR 82 | Ht 76.0 in | Wt 389.0 lb

## 2017-07-19 DIAGNOSIS — E118 Type 2 diabetes mellitus with unspecified complications: Secondary | ICD-10-CM | POA: Diagnosis not present

## 2017-07-19 DIAGNOSIS — E782 Mixed hyperlipidemia: Secondary | ICD-10-CM

## 2017-07-19 DIAGNOSIS — E1165 Type 2 diabetes mellitus with hyperglycemia: Secondary | ICD-10-CM | POA: Diagnosis not present

## 2017-07-19 DIAGNOSIS — I10 Essential (primary) hypertension: Secondary | ICD-10-CM | POA: Diagnosis not present

## 2017-07-19 DIAGNOSIS — Z794 Long term (current) use of insulin: Secondary | ICD-10-CM

## 2017-07-19 DIAGNOSIS — IMO0002 Reserved for concepts with insufficient information to code with codable children: Secondary | ICD-10-CM

## 2017-07-19 MED ORDER — FREESTYLE LIBRE READER DEVI
1.0000 | Freq: Once | 0 refills | Status: AC
Start: 2017-07-19 — End: 2017-07-19

## 2017-07-19 MED ORDER — FREESTYLE LIBRE SENSOR SYSTEM MISC: 2 refills | Status: AC

## 2017-07-19 NOTE — Patient Instructions (Signed)

## 2017-07-19 NOTE — Progress Notes (Signed)
Subjective:    Patient ID: Kenneth Patrick, male    DOB: 08-Apr-1951, PCP [name], MD   Past Medical History:  Diagnosis Date  . Asthma   . Depression   . Diabetic neuropathy ([place])   . DM2 (diabetes mellitus, type 2) ([place])   . Dyspnea   . Hypercholesterolemia   . Low back pain   . Respiratory infection   . Sleep apnea    Past Surgical History:  Procedure Laterality Date  . CHOLECYSTECTOMY    . left eye enucleation following trauma     Social History   Social History  . Marital status: Married    Spouse name: N/A  . Number of children: N/A  . Years of education: N/A   Occupational History  . retired Engineer, structural    Social History Main Topics  . Smoking status: Former Smoker    Packs/day: 1.50    Years: 20.00    Quit date: 10/05/2004  . Smokeless tobacco: Never Used  . Alcohol use No  . Drug use: No     Comment: stopped smoking marijuana in 2006  . Sexual activity: Not Asked   Other Topics Concern  . None   Social History Narrative  . None   Outpatient Encounter Prescriptions as of 07/19/2017  Medication Sig  . albuterol (VENTOLIN HFA) 108 (90 BASE) MCG/ACT inhaler Inhale 2 puffs into the lungs every 6 (six) hours as needed.    [name] atorvastatin (LIPITOR) 80 MG tablet Take 80 mg by mouth At bedtime.   [name] BREO ELLIPTA 100-25 MCG/INH AEPB USE 1 INHALATION DAILY  . Continuous Blood Gluc Receiver (FREESTYLE LIBRE READER) DEVI 1 Piece by Does not apply route once.  . Continuous Blood Gluc Sensor (FREESTYLE LIBRE SENSOR SYSTEM) MISC Use one sensor every 10 days.  . furosemide (LASIX) 40 MG tablet Take 40 mg by mouth daily as needed for fluid.   [name] gabapentin (NEURONTIN) 300 MG capsule Take 300 mg by mouth 2 (two) times daily.   [name] HUMALOG 100 UNIT/ML injection INJECT SUBCUTANEOUSLY UP TO 78 UNITS DAILY  . Insulin Glargine (BASAGLAR KWIKPEN) 100 UNIT/ML SOPN INJECT SUBCUTANEOUSLY 90  UNITS AT BEDTIME  . KLOR-CON M20 20 MEQ tablet   . metFORMIN (GLUCOPHAGE) 500  MG tablet Take 1 tablet by mouth 2 (two) times daily.  Glory Rosebush VERIO test strip TEST AS DIRECTED 4 TIMES  DAILY  . oxymetazoline (AFRIN) 0.05 % nasal spray Place 1-2 sprays into the nose 2 (two) times daily as needed.   . potassium chloride SA (KLOR-CON M20) 20 MEQ tablet Take 1 tablet (20 mEq total) by mouth daily.  . sertraline (ZOLOFT) 100 MG tablet Take 200 mg by mouth daily.   [name] SPIRIVA RESPIMAT 2.5 MCG/ACT AERS   . Tiotropium Bromide Monohydrate (SPIRIVA RESPIMAT) 2.5 MCG/ACT AERS Inhale 2 puffs into the lungs daily.  [name] VICTOZA 18 MG/3ML SOLN Inject 1.8 Units into the skin daily.    No facility-administered encounter medications on file as of 07/19/2017.    ALLERGIES: Allergies  Allergen Reactions  . Penicillins     Childhood allergy    VACCINATION STATUS: Immunization History  Administered Date(s) Administered  . Influenza Split 10/23/2011  . Influenza Whole 09/04/2010  . Influenza,inj,Quad PF,6+ Mos 06/08/2013, 09/03/2014, 07/31/2015, 06/22/2016  . Pneumococcal Conjugate-13 03/05/2015  . Pneumococcal Polysaccharide-23 05/14/2011, 11/27/2013    Diabetes  He presents for his follow-up diabetic visit. He has type 2 diabetes mellitus. Onset time: He was diagnosed  at approximate age of 69 years. His disease course has been improving. There are no hypoglycemic associated symptoms. Pertinent negatives for hypoglycemia include no confusion, headaches, pallor or seizures. There are no diabetic associated symptoms. Pertinent negatives for diabetes include no chest pain, no fatigue, no polydipsia, no polyphagia, no polyuria and no weakness. There are no hypoglycemic complications. Symptoms are improving. There are no diabetic complications. Risk factors for coronary artery disease include diabetes mellitus, dyslipidemia, hypertension, male sex, obesity, sedentary lifestyle and tobacco exposure. Current diabetic treatment includes insulin injections. He is compliant with treatment some of  the time. His weight is increasing steadily. He is following a generally unhealthy diet. When asked about meal planning, he reported none. He has had a previous visit with a dietitian. He never participates in exercise. His home blood glucose trend is increasing steadily. His breakfast blood glucose range is generally 140-180 mg/dl. His lunch blood glucose range is generally 140-180 mg/dl. His dinner blood glucose range is generally 140-180 mg/dl. His bedtime blood glucose range is generally 180-200 mg/dl. His overall blood glucose range is 140-180 mg/dl. (He did not bring his logs, his meter shows average blood glucose of 200-216.) Eye exam is current.  Hyperlipidemia  This is a chronic problem. The current episode started more than 1 year ago. Pertinent negatives include no chest pain, myalgias or shortness of breath. Current antihyperlipidemic treatment includes statins. Compliance problems include adherence to diet and adherence to exercise.  Risk factors for coronary artery disease include dyslipidemia, diabetes mellitus, obesity, male sex and a sedentary lifestyle.  Hypertension  This is a chronic problem. The current episode started more than 1 year ago. Pertinent negatives include no chest pain, headaches, neck pain, palpitations or shortness of breath. Risk factors for coronary artery disease include diabetes mellitus, dyslipidemia, male gender, obesity, smoking/tobacco exposure and sedentary lifestyle. Compliance problems include diet and exercise.      Review of Systems  Constitutional: Negative for chills, fatigue, fever and unexpected weight change.  HENT: Negative for dental problem, mouth sores and trouble swallowing.   Eyes: Negative for visual disturbance.  Respiratory: Negative for cough, choking, chest tightness, shortness of breath and wheezing.   Cardiovascular: Negative for chest pain, palpitations and leg swelling.  Gastrointestinal: Negative for abdominal distention, abdominal  pain, constipation, diarrhea, nausea and vomiting.  Endocrine: Negative for polydipsia, polyphagia and polyuria.  Genitourinary: Negative for dysuria, flank pain, hematuria and urgency.  Musculoskeletal: Negative for back pain, gait problem, myalgias and neck pain.  Skin: Negative for pallor, rash and wound.  Neurological: Negative for seizures, syncope, weakness, numbness and headaches.  Psychiatric/Behavioral: Negative.  Negative for confusion and dysphoric mood.    Objective:    BP 135/73   Pulse 82   Ht 6\' 4"  (1.93 m)   Wt (!) 389 lb (176.4 kg)   BMI 47.35 kg/m   Wt Readings from Last 3 Encounters:  07/19/17 (!) 389 lb (176.4 kg)  05/19/17 (!) 391 lb 9.6 oz (177.6 kg)  04/15/17 (!) 389 lb (176.4 kg)    Physical Exam  Constitutional: He is oriented to person, place, and time. He appears well-developed and well-nourished. He is cooperative. No distress.  HENT:  Head: Normocephalic and atraumatic.  Neck: Normal range of motion. Neck supple. No tracheal deviation present. No thyromegaly present.  Cardiovascular: Normal rate, S1 normal, S2 normal and normal heart sounds.  Exam reveals no gallop.   No murmur heard. Pulses:      Dorsalis pedis pulses are 1+ on  the right side, and 1+ on the left side.       Posterior tibial pulses are 1+ on the right side, and 1+ on the left side.  Pulmonary/Chest: Breath sounds normal. No respiratory distress. He has no wheezes.  Abdominal: Soft. Bowel sounds are normal. He exhibits no distension. There is no tenderness. There is no guarding and no CVA tenderness.  Musculoskeletal: He exhibits no edema.       Right shoulder: He exhibits no swelling and no deformity.  Neurological: He is alert and oriented to person, place, and time. He has normal strength and normal reflexes. No cranial nerve deficit or sensory deficit. Gait normal.  Skin: Skin is warm and dry. No rash noted. No cyanosis. Nails show no clubbing.  Psychiatric: He has a normal mood  and affect. His speech is normal and behavior is normal. Judgment and thought content normal. Cognition and memory are normal.    Diabetic Labs (most recent): Lab Results  Component Value Date   HGBA1C 7.4 (H) 07/12/2017   HGBA1C 7.8 (H) 04/05/2017   HGBA1C 8.0 (H) 01/06/2017   Recent Results (from the past 2160 hour(s))  Hemoglobin A1c     Status: Abnormal   Collection Time: 07/12/17  1:24 PM  Result Value Ref Range   Hgb A1c MFr Bld 7.4 (H) 4.8 - 5.6 %    Comment:          Prediabetes: 5.7 - 6.4          Diabetes: >6.4          Glycemic control for adults with diabetes: <7.0    Est. average glucose Bld gHb Est-mCnc 166 mg/dL  Renal function panel     Status: Abnormal   Collection Time: 07/12/17  1:24 PM  Result Value Ref Range   Glucose 107 (H) 65 - 99 mg/dL   BUN 19 8 - 27 mg/dL   Creatinine, Ser 1.30 (H) 0.76 - 1.27 mg/dL   GFR calc non Af Amer 57 (L) >59 mL/min/1.73   GFR calc Af Amer 66 >59 mL/min/1.73   BUN/Creatinine Ratio 15 10 - 24   Sodium 141 134 - 144 mmol/L   Potassium 4.7 3.5 - 5.2 mmol/L   Chloride 104 96 - 106 mmol/L   CO2 19 (L) 20 - 29 mmol/L   Calcium 9.5 8.6 - 10.2 mg/dL   Phosphorus 3.3 2.5 - 4.5 mg/dL   Albumin 4.3 3.6 - 4.8 g/dL  Lipid Panel w/o Chol/HDL Ratio     Status: None   Collection Time: 07/12/17  1:24 PM  Result Value Ref Range   Cholesterol, Total 165 100 - 199 mg/dL   Triglycerides 100 0 - 149 mg/dL   HDL 46 >39 mg/dL    Comment: **Effective July 26, 2017, HDL Cholesterol**   reference interval will be changing to:                                   Male        Male                               40 - [phone]   50 - [phone]    VLDL Cholesterol Cal 20 5 - 40 mg/dL   LDL Calculated 99 0 - 99 mg/dL    Assessment & Plan:   1. Uncontrolled type  2 diabetes mellitus with complication, with long-term current use of insulin ([place])  - his diabetes is  complicated by morbid obesity and sleep apnea and patient remains at a high risk for  more acute and chronic complications of diabetes which include CAD, CVA, CKD, retinopathy, and neuropathy. These are all discussed in detail with the patient.  Patient came with continued improvement in his blood glucose profile to near target range is, A1c of 7.4%   -  Glucose logs and insulin administration records pertaining to this visit,  to be scanned into patient's records.  Recent labs reviewed.   - I have re-counseled the patient on diet management and weight loss  by adopting a carbohydrate restricted / protein rich  Diet.  -  Suggestion is made for him to avoid simple carbohydrates  from his diet including Cakes, Sweet Desserts / Pastries, Ice Cream, Soda (diet and regular), Sweet Tea, Candies, Chips, Cookies, Store Bought Juices, Alcohol in Excess of  1-2 drinks a day, Artificial Sweeteners, and "Sugar-free" Products. This will help patient to have stable blood glucose profile and potentially avoid unintended weight gain.   - Patient is advised to stick to a routine mealtimes to eat 3 meals  a day and avoid unnecessary snacks ( to snack only to correct hypoglycemia).   - I have approached patient with the following individualized plan to manage diabetes and patient agrees.  - Continue basal insulin Basaglar 90 units qhs, and  continue Humalog  20 units TIDAC DAC for pre-meal BG readings of 90-150mg /dl, plus patient specific correction dose of rapid acting insulin for unexpected hyperglycemia above 150mg /dl, associated with strict monitoring of BG AC and HS.  -Adjustment parameters for hypo and hyperglycemia were given in a written document to patient. - He would benefit from continued glucose monitoring. I discussed and initiated prescription for the [facility] device. -Patient is encouraged to call clinic for blood glucose levels less than 70 or above 300 mg /dl. -He will continue on Victoza 1.8 mg subcutaneous daily. -I will continue metformin 500mg  po twice a day with  meals.  - Patient specific target  for A1c; LDL, HDL, Triglycerides, and  Waist Circumference were discussed in detail.  2) BP/HTN : controlled. Continue current medications. 3) Lipids/HPL:  Continue statins. 4)  Weight/Diet:  No success in weight loss, admits to dietary indiscretion. CDE consult in progress, exercise, and carbohydrates information provided.  5) Chronic Care/Health Maintenance:  -Patient is on ACEI/ARB and Statin medications and encouraged to continue to follow up with Ophthalmology, Podiatrist at least yearly or according to recommendations, and advised to  stay away from smoking. I have recommended yearly flu vaccine and pneumonia vaccination at least every 5 years; moderate intensity exercise for up to 150 minutes weekly; and  sleep for at least 7 hours a day.  - Time spent with the patient: 25 min, of which >50% was spent in reviewing his sugar logs , discussing his hypo- and hyper-glycemic episodes, reviewing his current and  previous labs and insulin doses and developing a plan to avoid hypo- and hyper-glycemia.   - I advised patient to maintain close follow up with [name], MD for primary care needs.  Follow up plan: -Return in about 3 months (around 10/19/2017) for follow up with pre-visit labs, meter, and logs.  Glade Lloyd, MD Phone: [phone]  Fax: [phone]   -  This note was partially dictated with voice recognition software. Similar sounding words can be transcribed inadequately or  may not  be corrected upon review.  07/19/2017, 3:28 PM

## 2017-07-27 ENCOUNTER — Other Ambulatory Visit: Payer: Self-pay | Admitting: "Endocrinology

## 2017-08-11 ENCOUNTER — Other Ambulatory Visit: Payer: Self-pay | Admitting: Pulmonary Disease

## 2017-08-24 DIAGNOSIS — Z713 Dietary counseling and surveillance: Secondary | ICD-10-CM | POA: Diagnosis not present

## 2017-08-24 DIAGNOSIS — Z0000 Encounter for general adult medical examination without abnormal findings: Secondary | ICD-10-CM | POA: Diagnosis not present

## 2017-08-24 DIAGNOSIS — R5383 Other fatigue: Secondary | ICD-10-CM | POA: Diagnosis not present

## 2017-08-24 DIAGNOSIS — E7800 Pure hypercholesterolemia, unspecified: Secondary | ICD-10-CM | POA: Diagnosis not present

## 2017-08-24 DIAGNOSIS — E1165 Type 2 diabetes mellitus with hyperglycemia: Secondary | ICD-10-CM | POA: Diagnosis not present

## 2017-08-24 DIAGNOSIS — Z1339 Encounter for screening examination for other mental health and behavioral disorders: Secondary | ICD-10-CM | POA: Diagnosis not present

## 2017-08-24 DIAGNOSIS — Z6841 Body Mass Index (BMI) 40.0 and over, adult: Secondary | ICD-10-CM | POA: Diagnosis not present

## 2017-08-24 DIAGNOSIS — Z125 Encounter for screening for malignant neoplasm of prostate: Secondary | ICD-10-CM | POA: Diagnosis not present

## 2017-08-24 DIAGNOSIS — Z1331 Encounter for screening for depression: Secondary | ICD-10-CM | POA: Diagnosis not present

## 2017-08-24 DIAGNOSIS — Z7189 Other specified counseling: Secondary | ICD-10-CM | POA: Diagnosis not present

## 2017-08-24 DIAGNOSIS — Z299 Encounter for prophylactic measures, unspecified: Secondary | ICD-10-CM | POA: Diagnosis not present

## 2017-08-24 DIAGNOSIS — Z79899 Other long term (current) drug therapy: Secondary | ICD-10-CM | POA: Diagnosis not present

## 2017-09-02 ENCOUNTER — Encounter: Payer: Self-pay | Admitting: "Endocrinology

## 2017-09-02 ENCOUNTER — Other Ambulatory Visit: Payer: Self-pay

## 2017-09-02 MED ORDER — LIRAGLUTIDE 18 MG/3ML ~~LOC~~ SOPN: 1.8000 mg | PEN_INJECTOR | Freq: Every day | SUBCUTANEOUS | 0 refills | Status: DC

## 2017-09-09 ENCOUNTER — Other Ambulatory Visit: Payer: Self-pay | Admitting: "Endocrinology

## 2017-09-13 ENCOUNTER — Other Ambulatory Visit: Payer: Self-pay | Admitting: "Endocrinology

## 2017-09-17 ENCOUNTER — Other Ambulatory Visit: Payer: Self-pay | Admitting: Pulmonary Disease

## 2017-09-17 ENCOUNTER — Other Ambulatory Visit: Payer: Self-pay | Admitting: "Endocrinology

## 2017-10-11 ENCOUNTER — Other Ambulatory Visit: Payer: Self-pay | Admitting: "Endocrinology

## 2017-10-11 DIAGNOSIS — E1165 Type 2 diabetes mellitus with hyperglycemia: Secondary | ICD-10-CM | POA: Diagnosis not present

## 2017-10-11 DIAGNOSIS — Z794 Long term (current) use of insulin: Secondary | ICD-10-CM | POA: Diagnosis not present

## 2017-10-11 DIAGNOSIS — E118 Type 2 diabetes mellitus with unspecified complications: Secondary | ICD-10-CM | POA: Diagnosis not present

## 2017-10-12 LAB — COMPREHENSIVE METABOLIC PANEL
ALBUMIN: 4.3 g/dL (ref 3.6–4.8)
ALT: 22 IU/L (ref 0–44)
AST: 37 IU/L (ref 0–40)
Albumin/Globulin Ratio: 1.7 (ref 1.2–2.2)
Alkaline Phosphatase: 120 IU/L — ABNORMAL HIGH (ref 39–117)
BUN / CREAT RATIO: 16 (ref 10–24)
BUN: 17 mg/dL (ref 8–27)
Bilirubin Total: 1.3 mg/dL — ABNORMAL HIGH (ref 0.0–1.2)
CHLORIDE: 106 mmol/L (ref 96–106)
CO2: 20 mmol/L (ref 20–29)
CREATININE: 1.08 mg/dL (ref 0.76–1.27)
Calcium: 9 mg/dL (ref 8.6–10.2)
GFR calc non Af Amer: 71 mL/min/{1.73_m2} (ref >59)
GFR, EST AFRICAN AMERICAN: 82 mL/min/{1.73_m2} (ref >59)
GLUCOSE: 127 mg/dL — AB (ref 65–99)
Globulin, Total: 2.6 g/dL (ref 1.5–4.5)
POTASSIUM: 4.5 mmol/L (ref 3.5–5.2)
SODIUM: 141 mmol/L (ref 134–144)
TOTAL PROTEIN: 6.9 g/dL (ref 6.0–8.5)

## 2017-10-12 LAB — SPECIMEN STATUS REPORT

## 2017-10-12 LAB — HGB A1C W/O EAG: Hgb A1c MFr Bld: 7 % — ABNORMAL HIGH (ref 4.8–5.6)

## 2017-10-19 ENCOUNTER — Encounter: Payer: Self-pay | Admitting: "Endocrinology

## 2017-10-19 ENCOUNTER — Ambulatory Visit (INDEPENDENT_AMBULATORY_CARE_PROVIDER_SITE_OTHER): Payer: Medicare Other | Admitting: "Endocrinology

## 2017-10-19 VITALS — BP 136/84 | HR 79 | Ht 76.0 in | Wt 388.0 lb

## 2017-10-19 DIAGNOSIS — Z794 Long term (current) use of insulin: Secondary | ICD-10-CM | POA: Diagnosis not present

## 2017-10-19 DIAGNOSIS — IMO0002 Reserved for concepts with insufficient information to code with codable children: Secondary | ICD-10-CM

## 2017-10-19 DIAGNOSIS — E782 Mixed hyperlipidemia: Secondary | ICD-10-CM | POA: Diagnosis not present

## 2017-10-19 DIAGNOSIS — I10 Essential (primary) hypertension: Secondary | ICD-10-CM | POA: Diagnosis not present

## 2017-10-19 DIAGNOSIS — E118 Type 2 diabetes mellitus with unspecified complications: Secondary | ICD-10-CM

## 2017-10-19 DIAGNOSIS — E1165 Type 2 diabetes mellitus with hyperglycemia: Secondary | ICD-10-CM | POA: Diagnosis not present

## 2017-10-19 MED ORDER — LIRAGLUTIDE 18 MG/3ML ~~LOC~~ SOPN: PEN_INJECTOR | SUBCUTANEOUS | 0 refills | Status: DC

## 2017-10-19 MED ORDER — INSULIN LISPRO 100 UNIT/ML ~~LOC~~ SOLN: SUBCUTANEOUS | 0 refills | Status: DC

## 2017-10-19 MED ORDER — METFORMIN HCL 500 MG PO TABS: 500.0000 mg | ORAL_TABLET | Freq: Two times a day (BID) | ORAL | 0 refills | Status: DC

## 2017-10-19 MED ORDER — METFORMIN HCL 500 MG PO TABS
500.0000 mg | ORAL_TABLET | Freq: Two times a day (BID) | ORAL | 0 refills | Status: DC
Start: 2017-10-19 — End: 2018-05-19

## 2017-10-19 MED ORDER — INSULIN GLARGINE 100 UNIT/ML ~~LOC~~ SOLN: 90.0000 [IU] | Freq: Every day | SUBCUTANEOUS | 0 refills | Status: DC

## 2017-10-19 MED ORDER — BASAGLAR KWIKPEN 100 UNIT/ML ~~LOC~~ SOPN: PEN_INJECTOR | SUBCUTANEOUS | 0 refills | Status: DC

## 2017-10-19 NOTE — Patient Instructions (Signed)

## 2017-10-19 NOTE — Progress Notes (Signed)
Subjective:    Patient ID: Kenneth Patrick, male    DOB: [birth year], PCP [name], MD   Past Medical History:  Diagnosis Date  . Asthma   . Depression   . Diabetic neuropathy ([place])   . DM2 (diabetes mellitus, type 2) ([place])   . Dyspnea   . Hypercholesterolemia   . Low back pain   . Respiratory infection   . Sleep apnea    Past Surgical History:  Procedure Laterality Date  . CHOLECYSTECTOMY    . left eye enucleation following trauma     Social History   Socioeconomic History  . Marital status: Married    Spouse name: None  . Number of children: None  . Years of education: None  . Highest education level: None  Social Needs  . Financial resource strain: None  . Food insecurity - worry: None  . Food insecurity - inability: None  . Transportation needs - medical: None  . Transportation needs - non-medical: None  Occupational History  . Occupation: retired Engineer, structural  Tobacco Use  . Smoking status: Former Smoker    Packs/day: 1.50    Years: 20.00    Pack years: 30.00    Last attempt to quit: 10/05/2004    Years since quitting: 13.0  . Smokeless tobacco: Never Used  Substance and Sexual Activity  . Alcohol use: No    Alcohol/week: 0.0 oz  . Drug use: No    Comment: stopped smoking marijuana in 2006  . Sexual activity: None  Other Topics Concern  . None  Social History Narrative  . None   Outpatient Encounter Medications as of 10/19/2017  Medication Sig  . [DISCONTINUED] insulin glargine (LANTUS) 100 UNIT/ML injection Inject 90 Units into the skin at bedtime.  . [DISCONTINUED] insulin glargine (LANTUS) 100 UNIT/ML injection Inject 0.9 mLs (90 Units total) into the skin at bedtime.  [name] albuterol (VENTOLIN HFA) 108 (90 BASE) MCG/ACT inhaler Inhale 2 puffs into the lungs every 6 (six) hours as needed.    [name] atorvastatin (LIPITOR) 80 MG tablet Take 80 mg by mouth At bedtime.   [name] BREO ELLIPTA 100-25 MCG/INH AEPB USE 1 INHALATION DAILY  . Continuous Blood  Gluc Sensor (FREESTYLE LIBRE SENSOR SYSTEM) MISC Use one sensor every 10 days.  . furosemide (LASIX) 40 MG tablet Take 40 mg by mouth daily as needed for fluid.   [name] gabapentin (NEURONTIN) 300 MG capsule Take 300 mg by mouth 2 (two) times daily.   . Insulin Glargine (BASAGLAR KWIKPEN) 100 UNIT/ML SOPN INJECT SUBCUTANEOUSLY 90  UNITS AT BEDTIME  . insulin lispro (HUMALOG) 100 UNIT/ML injection INJECT SUBCUTANEOUSLY UP TO 78 UNITS DAILY  . KLOR-CON M20 20 MEQ tablet   . liraglutide (VICTOZA) 18 MG/3ML SOPN INJECT SUBCUTANEOUSLY 1.8MG  DAILY  . metFORMIN (GLUCOPHAGE) 500 MG tablet Take 1 tablet (500 mg total) by mouth 2 (two) times daily.  Glory Rosebush VERIO test strip TEST AS DIRECTED 4 TIMES  DAILY  . oxymetazoline (AFRIN) 0.05 % nasal spray Place 1-2 sprays into the nose 2 (two) times daily as needed.   . potassium chloride SA (KLOR-CON M20) 20 MEQ tablet Take 1 tablet (20 mEq total) by mouth daily.  . sertraline (ZOLOFT) 100 MG tablet Take 200 mg by mouth daily.   [name] SPIRIVA RESPIMAT 2.5 MCG/ACT AERS   . Tiotropium Bromide Monohydrate (SPIRIVA RESPIMAT) 2.5 MCG/ACT AERS Inhale 2 puffs into the lungs daily.  . [DISCONTINUED] HUMALOG 100 UNIT/ML injection INJECT SUBCUTANEOUSLY UP  TO 78 UNITS DAILY  . [DISCONTINUED] Insulin Glargine (BASAGLAR KWIKPEN) 100 UNIT/ML SOPN INJECT SUBCUTANEOUSLY 90  UNITS AT BEDTIME  . [DISCONTINUED] insulin lispro (HUMALOG) 100 UNIT/ML injection INJECT SUBCUTANEOUSLY UP TO 78 UNITS DAILY  . [DISCONTINUED] liraglutide (VICTOZA) 18 MG/3ML SOPN INJECT SUBCUTANEOUSLY 1.8MG  DAILY  . [DISCONTINUED] metFORMIN (GLUCOPHAGE) 500 MG tablet Take 1 tablet by mouth 2 (two) times daily.  . [DISCONTINUED] metFORMIN (GLUCOPHAGE) 500 MG tablet Take 1 tablet (500 mg total) by mouth 2 (two) times daily.  . [DISCONTINUED] VICTOZA 18 MG/3ML SOPN INJECT SUBCUTANEOUSLY 1.8MG  DAILY   No facility-administered encounter medications on file as of 10/19/2017.    ALLERGIES: Allergies  Allergen  Reactions  . Penicillins     Childhood allergy    VACCINATION STATUS: Immunization History  Administered Date(s) Administered  . Influenza Split 10/23/2011  . Influenza Whole 09/04/2010  . Influenza,inj,Quad PF,6+ Mos 06/08/2013, 09/03/2014, 07/31/2015, 06/22/2016  . Pneumococcal Conjugate-13 03/05/2015  . Pneumococcal Polysaccharide-23 05/14/2011, 11/27/2013    Diabetes  He presents for his follow-up diabetic visit. He has type 2 diabetes mellitus. Onset time: He was diagnosed at approximate age of 30 years. His disease course has been improving. There are no hypoglycemic associated symptoms. Pertinent negatives for hypoglycemia include no confusion, headaches, pallor or seizures. There are no diabetic associated symptoms. Pertinent negatives for diabetes include no chest pain, no fatigue, no polydipsia, no polyphagia, no polyuria and no weakness. There are no hypoglycemic complications. Symptoms are improving. There are no diabetic complications. Risk factors for coronary artery disease include diabetes mellitus, dyslipidemia, hypertension, male sex, obesity, sedentary lifestyle and tobacco exposure. Current diabetic treatment includes insulin injections. He is compliant with treatment some of the time. His weight is decreasing steadily. He is following a generally unhealthy diet. When asked about meal planning, he reported none. He has had a previous visit with a dietitian. He never participates in exercise. His home blood glucose trend is decreasing steadily. His breakfast blood glucose range is generally 140-180 mg/dl. His lunch blood glucose range is generally 140-180 mg/dl. His dinner blood glucose range is generally 140-180 mg/dl. His bedtime blood glucose range is generally 140-180 mg/dl. His overall blood glucose range is 140-180 mg/dl. (He did not bring his logs, his meter shows average blood glucose of 200-216.) Eye exam is current.  Hyperlipidemia  This is a chronic problem. The  current episode started more than 1 year ago. Pertinent negatives include no chest pain, myalgias or shortness of breath. Current antihyperlipidemic treatment includes statins. Compliance problems include adherence to diet and adherence to exercise.  Risk factors for coronary artery disease include dyslipidemia, diabetes mellitus, obesity, male sex and a sedentary lifestyle.  Hypertension  This is a chronic problem. The current episode started more than 1 year ago. Pertinent negatives include no chest pain, headaches, neck pain, palpitations or shortness of breath. Risk factors for coronary artery disease include diabetes mellitus, dyslipidemia, male gender, obesity, smoking/tobacco exposure and sedentary lifestyle. Compliance problems include diet and exercise.      Review of Systems  Constitutional: Negative for chills, fatigue, fever and unexpected weight change.  HENT: Negative for dental problem, mouth sores and trouble swallowing.   Eyes: Negative for visual disturbance.  Respiratory: Negative for cough, choking, chest tightness, shortness of breath and wheezing.   Cardiovascular: Negative for chest pain, palpitations and leg swelling.  Gastrointestinal: Negative for abdominal distention, abdominal pain, constipation, diarrhea, nausea and vomiting.  Endocrine: Negative for polydipsia, polyphagia and polyuria.  Genitourinary: Negative for dysuria, flank  pain, hematuria and urgency.  Musculoskeletal: Negative for back pain, gait problem, myalgias and neck pain.  Skin: Negative for pallor, rash and wound.  Neurological: Negative for seizures, syncope, weakness, numbness and headaches.  Psychiatric/Behavioral: Negative.  Negative for confusion and dysphoric mood.    Objective:    BP 136/84   Pulse 79   Ht 6\' 4"  (1.93 m)   Wt (!) 388 lb (176 kg)   BMI 47.23 kg/m   Wt Readings from Last 3 Encounters:  10/19/17 (!) 388 lb (176 kg)  07/19/17 (!) 389 lb (176.4 kg)  05/19/17 (!) 391 lb  9.6 oz (177.6 kg)    Physical Exam  Constitutional: He is oriented to person, place, and time. He appears well-developed and well-nourished. He is cooperative. No distress.  HENT:  Head: Normocephalic and atraumatic.  Neck: Normal range of motion. Neck supple. No tracheal deviation present. No thyromegaly present.  Cardiovascular: Normal rate, S1 normal, S2 normal and normal heart sounds. Exam reveals no gallop.  No murmur heard. Pulses:      Dorsalis pedis pulses are 1+ on the right side, and 1+ on the left side.       Posterior tibial pulses are 1+ on the right side, and 1+ on the left side.  Pulmonary/Chest: Breath sounds normal. No respiratory distress. He has no wheezes.  Abdominal: Soft. Bowel sounds are normal. He exhibits no distension. There is no tenderness. There is no guarding and no CVA tenderness.  Musculoskeletal: He exhibits no edema.       Right shoulder: He exhibits no swelling and no deformity.  Neurological: He is alert and oriented to person, place, and time. He has normal strength and normal reflexes. No cranial nerve deficit or sensory deficit. Gait normal.  Skin: Skin is warm and dry. No rash noted. No cyanosis. Nails show no clubbing.  Psychiatric: He has a normal mood and affect. His speech is normal and behavior is normal. Judgment and thought content normal. Cognition and memory are normal.    Diabetic Labs (most recent): Lab Results  Component Value Date   HGBA1C 7.0 (H) 10/11/2017   HGBA1C 7.4 (H) 07/12/2017   HGBA1C 7.8 (H) 04/05/2017   Recent Results (from the past 2160 hour(s))  Comprehensive metabolic panel     Status: Abnormal   Collection Time: 10/11/17 11:48 AM  Result Value Ref Range   Glucose 127 (H) 65 - 99 mg/dL   BUN 17 8 - 27 mg/dL   Creatinine, Ser 1.08 0.76 - 1.27 mg/dL   GFR calc non Af Amer 71 >59 mL/min/1.73   GFR calc Af Amer 82 >59 mL/min/1.73   BUN/Creatinine Ratio 16 10 - 24   Sodium 141 134 - 144 mmol/L   Potassium 4.5 3.5 -  5.2 mmol/L   Chloride 106 96 - 106 mmol/L   CO2 20 20 - 29 mmol/L   Calcium 9.0 8.6 - 10.2 mg/dL   Total Protein 6.9 6.0 - 8.5 g/dL   Albumin 4.3 3.6 - 4.8 g/dL   Globulin, Total 2.6 1.5 - 4.5 g/dL   Albumin/Globulin Ratio 1.7 1.2 - 2.2   Bilirubin Total 1.3 (H) 0.0 - 1.2 mg/dL   Alkaline Phosphatase 120 (H) 39 - 117 IU/L   AST 37 0 - 40 IU/L   ALT 22 0 - 44 IU/L  Hgb A1c w/o eAG     Status: Abnormal   Collection Time: 10/11/17 11:48 AM  Result Value Ref Range   Hgb A1c MFr Bld  7.0 (H) 4.8 - 5.6 %    Comment:          Prediabetes: 5.7 - 6.4          Diabetes: >6.4          Glycemic control for adults with diabetes: <7.0   Specimen status report     Status: None   Collection Time: 10/11/17 11:48 AM  Result Value Ref Range   specimen status report Comment     Comment: Isac Caddy CMP14 Default Ambig Abbrev CMP14 Default A hand-written panel/profile was received from your office. In accordance with the [facility] Ambiguous Test Code Policy dated July 6503, we have completed your order by using the closest currently or formerly recognized AMA panel.  We have assigned Comprehensive Metabolic Panel (14), Test Code #322000 to this request.  If this is not the testing you wished to receive on this specimen, please contact the [facility] Client Inquiry/Technical Services Department to clarify the test order.  We appreciate your business.     Assessment & Plan:   1. Uncontrolled type 2 diabetes mellitus with complication, with long-term current use of insulin ([place])  - his diabetes is  complicated by morbid obesity and sleep apnea and patient remains at a high risk for more acute and chronic complications of diabetes which include CAD, CVA, CKD, retinopathy, and neuropathy. These are all discussed in detail with the patient.  Patient came with continued improvement in his blood glucose profile to near target range is, A1c of 7% progressively improving from 9.8%.   -  Glucose logs and  insulin administration records pertaining to this visit,  to be scanned into patient's records.  Recent labs reviewed.   - I have re-counseled the patient on diet management and weight loss  by adopting a carbohydrate restricted / protein rich  Diet.  -  Suggestion is made for him to avoid simple carbohydrates  from his diet including Cakes, Sweet Desserts / Pastries, Ice Cream, Soda (diet and regular), Sweet Tea, Candies, Chips, Cookies, Store Bought Juices, Alcohol in Excess of  1-2 drinks a day, Artificial Sweeteners, and "Sugar-free" Products. This will help patient to have stable blood glucose profile and potentially avoid unintended weight gain.  - Patient is advised to stick to a routine mealtimes to eat 3 meals  a day and avoid unnecessary snacks ( to snack only to correct hypoglycemia).   - I have approached patient with the following individualized plan to manage diabetes and patient agrees.  -  His CGM snapshot analysis shows 77% time in range, 11% above target and 12% below target . -  I advised him to continue  basal insulin Basaglar 90 units qhs, and  continue Humalog  20 units TIDAC DAC for pre-meal BG readings of 90-150mg /dl, plus patient specific correction dose of rapid acting insulin for unexpected hyperglycemia above 150mg /dl, associated with strict documentation of blood glucose 4 times a day-before meals and at bedtime.   -Adjustment parameters for hypo and hyperglycemia were given in a written document to patient. -Patient is encouraged to call clinic for blood glucose levels less than 70 or above 300 mg /dl. -He will continue on Victoza 1.8 mg subcutaneous daily. -I will continue metformin 500mg  po twice a day with meals.  - Patient specific target  for A1c; LDL, HDL, Triglycerides, and  Waist Circumference were discussed in detail.  2) BP/HTN :  his blood pressure is controlled to target. I advised him to continue his current blood  pressure medications.  3)  Lipids/HPL:  Continue statins. 4)  Weight/Diet:  No significant  success in weight loss, admits to dietary indiscretion. CDE consult in progress, exercise, and carbohydrates information provided.  5) Chronic Care/Health Maintenance:  -Patient is on ACEI/ARB and Statin medications and encouraged to continue to follow up with Ophthalmology, Podiatrist at least yearly or according to recommendations, and advised to  stay away from smoking. I have recommended yearly flu vaccine and pneumonia vaccination at least every 5 years; moderate intensity exercise for up to 150 minutes weekly; and  sleep for at least 7 hours a day.  - Time spent with the patient: 25 min, of which >50% was spent in reviewing his blood glucose logs , discussing his hypo- and hyper-glycemic episodes, reviewing his current and  previous labs and insulin doses and developing a plan to avoid hypo- and hyper-glycemia. Please refer to Patient Instructions for Blood Glucose Monitoring and Insulin/Medications Dosing Guide"  in media tab for additional information.  - I advised patient to maintain close follow up with Jerene Bears B, MD for primary care needs.  Follow up plan: -Return in about 3 months (around 01/17/2018) for meter, and logs.  Glade Lloyd, MD Phone: [phone]  Fax: [phone]   -  This note was partially dictated with voice recognition software. Similar sounding words can be transcribed inadequately or may not  be corrected upon review.  10/19/2017, 4:37 PM

## 2017-11-09 ENCOUNTER — Encounter: Payer: Self-pay | Admitting: "Endocrinology

## 2017-11-09 ENCOUNTER — Encounter: Payer: Self-pay | Admitting: Pulmonary Disease

## 2017-11-09 MED ORDER — FLUTICASONE FUROATE-VILANTEROL 100-25 MCG/INH IN AEPB: INHALATION_SPRAY | RESPIRATORY_TRACT | 5 refills | Status: DC

## 2017-11-10 ENCOUNTER — Other Ambulatory Visit: Payer: Self-pay

## 2017-11-10 MED ORDER — INSULIN LISPRO 100 UNIT/ML ~~LOC~~ SOLN: SUBCUTANEOUS | 0 refills | Status: DC

## 2017-11-25 DIAGNOSIS — E1165 Type 2 diabetes mellitus with hyperglycemia: Secondary | ICD-10-CM | POA: Diagnosis not present

## 2017-11-25 DIAGNOSIS — F329 Major depressive disorder, single episode, unspecified: Secondary | ICD-10-CM | POA: Diagnosis not present

## 2017-11-25 DIAGNOSIS — J449 Chronic obstructive pulmonary disease, unspecified: Secondary | ICD-10-CM | POA: Diagnosis not present

## 2017-11-25 DIAGNOSIS — R600 Localized edema: Secondary | ICD-10-CM | POA: Diagnosis not present

## 2017-11-25 DIAGNOSIS — E1142 Type 2 diabetes mellitus with diabetic polyneuropathy: Secondary | ICD-10-CM | POA: Diagnosis not present

## 2017-11-25 DIAGNOSIS — Z299 Encounter for prophylactic measures, unspecified: Secondary | ICD-10-CM | POA: Diagnosis not present

## 2017-11-25 DIAGNOSIS — E7800 Pure hypercholesterolemia, unspecified: Secondary | ICD-10-CM | POA: Diagnosis not present

## 2017-11-25 DIAGNOSIS — R748 Abnormal levels of other serum enzymes: Secondary | ICD-10-CM | POA: Diagnosis not present

## 2017-11-25 DIAGNOSIS — Z6841 Body Mass Index (BMI) 40.0 and over, adult: Secondary | ICD-10-CM | POA: Diagnosis not present

## 2017-12-03 ENCOUNTER — Encounter: Payer: Self-pay | Admitting: "Endocrinology

## 2017-12-06 ENCOUNTER — Telehealth: Payer: Self-pay

## 2017-12-06 MED ORDER — LIRAGLUTIDE 18 MG/3ML ~~LOC~~ SOPN: PEN_INJECTOR | SUBCUTANEOUS | 2 refills | Status: DC

## 2017-12-06 NOTE — Telephone Encounter (Signed)
Victoza sent to pharmacy.

## 2017-12-08 ENCOUNTER — Telehealth: Payer: Self-pay | Admitting: Pulmonary Disease

## 2017-12-08 NOTE — Telephone Encounter (Signed)
Called [facility], provided clinical information regarding Spiriva for a tier exception request.  This was not started by our office, but since our office prescribes this medication the info was provided.      Determination expected within 3 days.    Pt ID: [id number]  Will hold for follow-up.  We will be contacted via phone call for status determination.

## 2017-12-09 ENCOUNTER — Telehealth: Payer: Self-pay

## 2017-12-09 NOTE — Telephone Encounter (Signed)
Kenneth Patrick with [employer] clinical exception for tier for the Spiriva  CB [phone] option 5  Fax [phone]

## 2017-12-09 NOTE — Telephone Encounter (Signed)
[employer] called requesting a tier exception for Lantus. I advised them that we received the form by fax yesterday and we are continuously working on these and this will be faxed back no later than tomorrow.

## 2017-12-09 NOTE — Telephone Encounter (Signed)
Hassan Rowan from [facility] called to say Kenneth Patrick was denied CB # [phone] option 5

## 2017-12-09 NOTE — Telephone Encounter (Signed)
Called and spoke to [place] with [facility]. She states the Spiriva was denied as it "did not meet criteria". Donnie Aho states the pt needed to try and fail two medications in that drug class, per pt's chart he has only been on Anoro. Donnie Aho also states that the pt's [facility] was needing clinical information as the pt initiated a tier exception. Clinical information given for pt's Breo, will await determination in 48-72 hours.   Called and spoke to pt. Informed him of the determination for the Spiriva and how [facility] is awaiting determination. Pt states he has an appt tomorrow with RA. Advised pt we can give pt samples and the paperwork to start pt assistance for Spiriva at his appt tomorrow. Pt verbalized understanding and denied any further questions or concerns at this time.   Will keep message open to follow up on Breo determination.

## 2017-12-10 ENCOUNTER — Ambulatory Visit (INDEPENDENT_AMBULATORY_CARE_PROVIDER_SITE_OTHER): Payer: Medicare Other | Admitting: Pulmonary Disease

## 2017-12-10 ENCOUNTER — Encounter: Payer: Self-pay | Admitting: Pulmonary Disease

## 2017-12-10 DIAGNOSIS — G4733 Obstructive sleep apnea (adult) (pediatric): Secondary | ICD-10-CM | POA: Diagnosis not present

## 2017-12-10 DIAGNOSIS — J449 Chronic obstructive pulmonary disease, unspecified: Secondary | ICD-10-CM

## 2017-12-10 DIAGNOSIS — I2781 Cor pulmonale (chronic): Secondary | ICD-10-CM | POA: Diagnosis not present

## 2017-12-10 MED ORDER — TIOTROPIUM BROMIDE MONOHYDRATE 1.25 MCG/ACT IN AERS: 2.0000 | INHALATION_SPRAY | Freq: Every day | RESPIRATORY_TRACT | 0 refills | Status: DC

## 2017-12-10 MED ORDER — FLUTICASONE FUROATE-VILANTEROL 100-25 MCG/INH IN AEPB: 1.0000 | INHALATION_SPRAY | Freq: Every day | RESPIRATORY_TRACT | 0 refills | Status: DC

## 2017-12-10 NOTE — Progress Notes (Signed)
   Subjective:    Patient ID: Kenneth Patrick, male    DOB: 12-01-1950, 67 y.o.   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  h/o 'asthma' many years ago, has used inhalers &diskus c/o wheezing on perfumes, cooking  He smoked >60 Pyrs before quitting in 2006.  Six-month follow-up today-he is now on Medicare, but his wife and him of several rounds of medications, he has had the donut hole already and wonders if there are low cost alternative to Brio and Spiriva.  This regimen has worked well for him overall but he wonders if Spiriva is helping him at all.  He continues to have pedal edema but denies orthopnea or proximal nocturnal dyspnea.  His weight has gone up to 390 pounds, he is to be 366 in 2017 and 385 last visit.  He is compliant with Lasix only once daily.  He denies cough sputum production. He received a new CPAP machine 2018 and is very compliant with this.  No problems with mask or pressure  Med history -  12/2012 -Added spiriva - self dc'd -Does not feel he needs it, thought it caused leg swelling. 06/2013 -Breo in place of Symbicort       2017 >>anoro did not work         Significant tests/ events  Spirometry 2011- severe airway obstruction with a low ratio &FEV1 of 25 %, good effort !  02/2017 Spirometry - FEV1 of 41%, ratio 60, FVC 51%  Admitted Feb 2015 -for acute hypoxic respiratory failure and AKI/metabolic acidosis in the setting of RSV bronchitis.  CT chest showed bilateral lower lobe bronchiectasis.  2-D echo showed grade 1 diastolic dysfunction with preserved EF.  Had a low titer c-ANCA , Positive  Seen by ID , HIV neg.  Histoplasma ag positive in urine , but serology neg . (lived in [place] 10 yrs ago)   03/2007 CPAP 14 cm  Review of Systems neg for any significant sore throat, dysphagia, itching, sneezing, nasal congestion or excess/ purulent secretions, fever, chills, sweats, unintended  wt loss, pleuritic or exertional cp, hempoptysis, orthopnea pnd or change in chronic leg swelling.   Also denies presyncope, palpitations, heartburn, abdominal pain, nausea, vomiting, diarrhea or change in bowel or urinary habits, dysuria,hematuria, rash, arthralgias, visual complaints, headache, numbness weakness or ataxia.     Objective:   Physical Exam  Gen. Pleasant, obese, in no distress ENT - no lesions, no post nasal drip Neck: No JVD, no thyromegaly, no carotid bruits Lungs: no use of accessory muscles, no dullness to percussion, decreased without rales or rhonchi  Cardiovascular: Rhythm regular, heart sounds  normal, no murmurs or gallops, 2+ peripheral edema Musculoskeletal: No deformities, no cyanosis or clubbing , no tremors        Assessment & Plan:

## 2017-12-10 NOTE — Addendum Note (Signed)
Addended by: Valerie Salts on: 12/10/2017 04:15 PM   Modules accepted: Orders

## 2017-12-10 NOTE — Assessment & Plan Note (Signed)
Increase Lasix 40 mg twice daily until weight down by five-lbs

## 2017-12-10 NOTE — Assessment & Plan Note (Signed)
Sample of Spiriva. [facility] Prescription for duo nebs every 6 hours  X 1 month with 3 refills  Call me if this regimen does not work for some reason.

## 2017-12-10 NOTE — Patient Instructions (Signed)
Sample of Spiriva. We will [facility]. Prescription for duo nebs every 6 hours  X 1 month with 3 refills  Call me if this regimen does not work for some reason. Increase Lasix 40 mg twice daily until weight down by five-lbs

## 2017-12-10 NOTE — Assessment & Plan Note (Signed)
Weight loss encouraged, compliance with goal of at least 4-6 hrs every night is the expectation. Advised against medications with sedative side effects Cautioned against driving when sleepy - understanding that sleepiness will vary on a day to day basis  

## 2017-12-24 ENCOUNTER — Other Ambulatory Visit: Payer: Self-pay

## 2017-12-24 ENCOUNTER — Encounter: Payer: Self-pay | Admitting: Pulmonary Disease

## 2017-12-24 DIAGNOSIS — J449 Chronic obstructive pulmonary disease, unspecified: Secondary | ICD-10-CM

## 2017-12-24 MED ORDER — IPRATROPIUM-ALBUTEROL 0.5-2.5 (3) MG/3ML IN SOLN: 3.0000 mL | Freq: Four times a day (QID) | RESPIRATORY_TRACT | 3 refills | Status: DC | PRN

## 2017-12-28 ENCOUNTER — Encounter: Payer: Self-pay | Admitting: Pulmonary Disease

## 2017-12-28 MED ORDER — IPRATROPIUM-ALBUTEROL 0.5-2.5 (3) MG/3ML IN SOLN: 3.0000 mL | Freq: Four times a day (QID) | RESPIRATORY_TRACT | 5 refills | Status: DC | PRN

## 2018-01-11 ENCOUNTER — Other Ambulatory Visit: Payer: Self-pay | Admitting: "Endocrinology

## 2018-01-11 DIAGNOSIS — E118 Type 2 diabetes mellitus with unspecified complications: Secondary | ICD-10-CM | POA: Diagnosis not present

## 2018-01-11 DIAGNOSIS — Z794 Long term (current) use of insulin: Secondary | ICD-10-CM | POA: Diagnosis not present

## 2018-01-11 DIAGNOSIS — E1165 Type 2 diabetes mellitus with hyperglycemia: Secondary | ICD-10-CM | POA: Diagnosis not present

## 2018-01-12 LAB — COMPREHENSIVE METABOLIC PANEL
A/G RATIO: 1.6 (ref 1.2–2.2)
ALT: 33 IU/L (ref 0–44)
AST: 66 IU/L — AB (ref 0–40)
Albumin: 4.1 g/dL (ref 3.6–4.8)
Alkaline Phosphatase: 126 IU/L — ABNORMAL HIGH (ref 39–117)
BUN/Creatinine Ratio: 18 (ref 10–24)
BUN: 22 mg/dL (ref 8–27)
Bilirubin Total: 1 mg/dL (ref 0.0–1.2)
CALCIUM: 8.9 mg/dL (ref 8.6–10.2)
CO2: 22 mmol/L (ref 20–29)
CREATININE: 1.24 mg/dL (ref 0.76–1.27)
Chloride: 103 mmol/L (ref 96–106)
GFR, EST AFRICAN AMERICAN: 70 mL/min/{1.73_m2} (ref >59)
GFR, EST NON AFRICAN AMERICAN: 60 mL/min/{1.73_m2} (ref >59)
GLOBULIN, TOTAL: 2.6 g/dL (ref 1.5–4.5)
Glucose: 178 mg/dL — ABNORMAL HIGH (ref 65–99)
Potassium: 5.1 mmol/L (ref 3.5–5.2)
Sodium: 142 mmol/L (ref 134–144)
TOTAL PROTEIN: 6.7 g/dL (ref 6.0–8.5)

## 2018-01-12 LAB — SPECIMEN STATUS REPORT

## 2018-01-12 LAB — HGB A1C W/O EAG: Hgb A1c MFr Bld: 7.2 % — ABNORMAL HIGH (ref 4.8–5.6)

## 2018-01-15 ENCOUNTER — Other Ambulatory Visit: Payer: Self-pay | Admitting: "Endocrinology

## 2018-01-20 ENCOUNTER — Ambulatory Visit (INDEPENDENT_AMBULATORY_CARE_PROVIDER_SITE_OTHER): Payer: Medicare Other | Admitting: "Endocrinology

## 2018-01-20 ENCOUNTER — Encounter: Payer: Self-pay | Admitting: "Endocrinology

## 2018-01-20 VITALS — BP 132/74 | HR 87 | Ht 76.0 in | Wt 390.0 lb

## 2018-01-20 DIAGNOSIS — I10 Essential (primary) hypertension: Secondary | ICD-10-CM

## 2018-01-20 DIAGNOSIS — E1165 Type 2 diabetes mellitus with hyperglycemia: Secondary | ICD-10-CM | POA: Diagnosis not present

## 2018-01-20 DIAGNOSIS — E118 Type 2 diabetes mellitus with unspecified complications: Secondary | ICD-10-CM

## 2018-01-20 DIAGNOSIS — IMO0002 Reserved for concepts with insufficient information to code with codable children: Secondary | ICD-10-CM

## 2018-01-20 DIAGNOSIS — E782 Mixed hyperlipidemia: Secondary | ICD-10-CM | POA: Diagnosis not present

## 2018-01-20 DIAGNOSIS — Z794 Long term (current) use of insulin: Secondary | ICD-10-CM

## 2018-01-20 NOTE — Progress Notes (Signed)
Subjective:    Patient ID: Kenneth Patrick, male    DOB: 08/20/1951, PCP [name], MD   Past Medical History:  Diagnosis Date  . Asthma   . Depression   . Diabetic neuropathy ([place])   . DM2 (diabetes mellitus, type 2) ([place])   . Dyspnea   . Hypercholesterolemia   . Low back pain   . Respiratory infection   . Sleep apnea    Past Surgical History:  Procedure Laterality Date  . CHOLECYSTECTOMY    . left eye enucleation following trauma     Social History   Socioeconomic History  . Marital status: Married    Spouse name: Not on file  . Number of children: Not on file  . Years of education: Not on file  . Highest education level: Not on file  Occupational History  . Occupation: retired Engineer, structural  Social Needs  . Financial resource strain: Not on file  . Food insecurity:    Worry: Not on file    Inability: Not on file  . Transportation needs:    Medical: Not on file    Non-medical: Not on file  Tobacco Use  . Smoking status: Former Smoker    Packs/day: 1.50    Years: 20.00    Pack years: 30.00    Last attempt to quit: 10/05/2004    Years since quitting: 13.3  . Smokeless tobacco: Never Used  Substance and Sexual Activity  . Alcohol use: No    Alcohol/week: 0.0 oz  . Drug use: No    Comment: stopped smoking marijuana in 2006  . Sexual activity: Not on file  Lifestyle  . Physical activity:    Days per week: Not on file    Minutes per session: Not on file  . Stress: Not on file  Relationships  . Social connections:    Talks on phone: Not on file    Gets together: Not on file    Attends religious service: Not on file    Active member of club or organization: Not on file    Attends meetings of clubs or organizations: Not on file    Relationship status: Not on file  Other Topics Concern  . Not on file  Social History Narrative  . Not on file   Outpatient Encounter Medications as of 01/20/2018  Medication Sig  . albuterol (VENTOLIN HFA) 108 (90  BASE) MCG/ACT inhaler Inhale 2 puffs into the lungs every 6 (six) hours as needed.    [name] atorvastatin (LIPITOR) 80 MG tablet Take 80 mg by mouth At bedtime.   . Continuous Blood Gluc Sensor (FREESTYLE LIBRE SENSOR SYSTEM) MISC Use one sensor every 10 days.  . fluticasone furoate-vilanterol (BREO ELLIPTA) 100-25 MCG/INH AEPB USE 1 INHALATION DAILY  . furosemide (LASIX) 40 MG tablet Take 40 mg by mouth daily as needed for fluid.   [name] gabapentin (NEURONTIN) 300 MG capsule Take 300 mg by mouth 2 (two) times daily.   . Insulin Glargine (BASAGLAR KWIKPEN) 100 UNIT/ML SOPN INJECT SUBCUTANEOUSLY 90  UNITS AT BEDTIME  . insulin lispro (HUMALOG) 100 UNIT/ML injection INJECT SUBCUTANEOUSLY UP TO 78 UNITS DAILY  . ipratropium-albuterol (DUONEB) 0.5-2.5 (3) MG/3ML SOLN Take 3 mLs by nebulization every 6 (six) hours as needed. DX: J44.9  . KLOR-CON M20 20 MEQ tablet   . liraglutide (VICTOZA) 18 MG/3ML SOPN INJECT SUBCUTANEOUSLY 1.8MG  DAILY  . metFORMIN (GLUCOPHAGE) 500 MG tablet Take 1 tablet (500 mg total) by mouth 2 (two)  times daily.  Glory Rosebush VERIO test strip TEST AS DIRECTED 4 TIMES  DAILY  . oxymetazoline (AFRIN) 0.05 % nasal spray Place 1-2 sprays into the nose 2 (two) times daily as needed.   . potassium chloride SA (KLOR-CON M20) 20 MEQ tablet Take 1 tablet (20 mEq total) by mouth daily.  . sertraline (ZOLOFT) 100 MG tablet Take 200 mg by mouth daily.   [name] SPIRIVA RESPIMAT 2.5 MCG/ACT AERS   . Tiotropium Bromide Monohydrate (SPIRIVA RESPIMAT) 1.25 MCG/ACT AERS Inhale 2 puffs into the lungs daily. Patient was given 1.33mcg due to the office not having 2.87mcg  . [DISCONTINUED] fluticasone furoate-vilanterol (BREO ELLIPTA) 100-25 MCG/INH AEPB Inhale 1 puff into the lungs daily.  . [DISCONTINUED] Tiotropium Bromide Monohydrate (SPIRIVA RESPIMAT) 2.5 MCG/ACT AERS Inhale 2 puffs into the lungs daily.   No facility-administered encounter medications on file as of 01/20/2018.    ALLERGIES: Allergies   Allergen Reactions  . Penicillins     Childhood allergy    VACCINATION STATUS: Immunization History  Administered Date(s) Administered  . Influenza Split 10/23/2011  . Influenza Whole 09/04/2010  . Influenza,inj,Quad PF,6+ Mos 06/08/2013, 09/03/2014, 07/31/2015, 06/22/2016  . Pneumococcal Conjugate-13 03/05/2015  . Pneumococcal Polysaccharide-23 05/14/2011, 11/27/2013    Diabetes  He presents for his follow-up diabetic visit. He has type 2 diabetes mellitus. Onset time: He was diagnosed at approximate age of 81 years. His disease course has been stable. There are no hypoglycemic associated symptoms. Pertinent negatives for hypoglycemia include no confusion, headaches, pallor or seizures. There are no diabetic associated symptoms. Pertinent negatives for diabetes include no chest pain, no fatigue, no polydipsia, no polyphagia, no polyuria and no weakness. There are no hypoglycemic complications. Symptoms are stable. There are no diabetic complications. Risk factors for coronary artery disease include diabetes mellitus, dyslipidemia, hypertension, male sex, obesity, sedentary lifestyle and tobacco exposure. Current diabetic treatment includes insulin injections. He is compliant with treatment some of the time. His weight is increasing steadily. He is following a generally unhealthy diet. When asked about meal planning, he reported none. He has had a previous visit with a dietitian. He never participates in exercise. His home blood glucose trend is decreasing steadily. His breakfast blood glucose range is generally 110-130 mg/dl. His lunch blood glucose range is generally 140-180 mg/dl. His dinner blood glucose range is generally 140-180 mg/dl. His bedtime blood glucose range is generally 140-180 mg/dl. His overall blood glucose range is 140-180 mg/dl. Eye exam is current.  Hyperlipidemia  This is a chronic problem. The current episode started more than 1 year ago. The problem is uncontrolled.  Exacerbating diseases include diabetes and obesity. Pertinent negatives include no chest pain, myalgias or shortness of breath. Current antihyperlipidemic treatment includes statins. Compliance problems include adherence to diet and adherence to exercise.  Risk factors for coronary artery disease include dyslipidemia, diabetes mellitus, obesity, male sex and a sedentary lifestyle.  Hypertension  This is a chronic problem. The current episode started more than 1 year ago. The problem is controlled. Pertinent negatives include no chest pain, headaches, neck pain, palpitations or shortness of breath. Risk factors for coronary artery disease include diabetes mellitus, dyslipidemia, male gender, obesity, smoking/tobacco exposure and sedentary lifestyle. Compliance problems include diet and exercise.      Review of Systems  Constitutional: Negative for chills, fatigue, fever and unexpected weight change.  HENT: Negative for dental problem, mouth sores and trouble swallowing.   Eyes: Negative for visual disturbance.  Respiratory: Negative for cough, choking, chest tightness,  shortness of breath and wheezing.   Cardiovascular: Negative for chest pain, palpitations and leg swelling.  Gastrointestinal: Negative for abdominal distention, abdominal pain, constipation, diarrhea, nausea and vomiting.  Endocrine: Negative for polydipsia, polyphagia and polyuria.  Genitourinary: Negative for dysuria, flank pain, hematuria and urgency.  Musculoskeletal: Negative for back pain, gait problem, myalgias and neck pain.  Skin: Negative for pallor, rash and wound.  Neurological: Negative for seizures, syncope, weakness, numbness and headaches.  Psychiatric/Behavioral: Negative.  Negative for confusion and dysphoric mood.    Objective:    BP 132/74   Pulse 87   Ht 6\' 4"  (1.93 m)   Wt (!) 390 lb (176.9 kg)   BMI 47.47 kg/m   Wt Readings from Last 3 Encounters:  01/20/18 (!) 390 lb (176.9 kg)  12/10/17 (!) 390  lb (176.9 kg)  10/19/17 (!) 388 lb (176 kg)    Physical Exam  Constitutional: He is oriented to person, place, and time. He appears well-developed. He is cooperative. No distress.  HENT:  Head: Normocephalic and atraumatic.  Eyes:  He wears dark heavy glasses due to reported prior injury to eyes.  Neck: Normal range of motion. Neck supple. No tracheal deviation present. No thyromegaly present.  Cardiovascular: Normal rate, S1 normal and S2 normal. Exam reveals no gallop.  No murmur heard. Pulses:      Dorsalis pedis pulses are 1+ on the right side, and 1+ on the left side.       Posterior tibial pulses are 1+ on the right side, and 1+ on the left side.  Pulmonary/Chest: Effort normal. No respiratory distress. He has no wheezes.  Abdominal: Bowel sounds are normal. He exhibits no distension. There is no tenderness. There is no guarding and no CVA tenderness.  Musculoskeletal: He exhibits no edema.       Right shoulder: He exhibits no swelling and no deformity.  Neurological: He is alert and oriented to person, place, and time. He has normal strength and normal reflexes. No cranial nerve deficit or sensory deficit. Gait normal.  Skin: Skin is warm and dry. No rash noted. No cyanosis. Nails show no clubbing.  Psychiatric: He has a normal mood and affect. His speech is normal. Judgment normal. Cognition and memory are normal.    Diabetic Labs (most recent): Lab Results  Component Value Date   HGBA1C 7.2 (H) 01/11/2018   HGBA1C 7.0 (H) 10/11/2017   HGBA1C 7.4 (H) 07/12/2017   Recent Results (from the past 2160 hour(s))  Comprehensive metabolic panel     Status: Abnormal   Collection Time: 01/11/18  3:30 PM  Result Value Ref Range   Glucose 178 (H) 65 - 99 mg/dL   BUN 22 8 - 27 mg/dL   Creatinine, Ser 1.24 0.76 - 1.27 mg/dL   GFR calc non Af Amer 60 >59 mL/min/1.73   GFR calc Af Amer 70 >59 mL/min/1.73   BUN/Creatinine Ratio 18 10 - 24   Sodium 142 134 - 144 mmol/L   Potassium 5.1  3.5 - 5.2 mmol/L   Chloride 103 96 - 106 mmol/L   CO2 22 20 - 29 mmol/L   Calcium 8.9 8.6 - 10.2 mg/dL   Total Protein 6.7 6.0 - 8.5 g/dL   Albumin 4.1 3.6 - 4.8 g/dL   Globulin, Total 2.6 1.5 - 4.5 g/dL   Albumin/Globulin Ratio 1.6 1.2 - 2.2   Bilirubin Total 1.0 0.0 - 1.2 mg/dL   Alkaline Phosphatase 126 (H) 39 - 117 IU/L   AST  66 (H) 0 - 40 IU/L   ALT 33 0 - 44 IU/L  Hgb A1c w/o eAG     Status: Abnormal   Collection Time: 01/11/18  3:30 PM  Result Value Ref Range   Hgb A1c MFr Bld 7.2 (H) 4.8 - 5.6 %    Comment:          Prediabetes: 5.7 - 6.4          Diabetes: >6.4          Glycemic control for adults with diabetes: <7.0   Specimen status report     Status: None   Collection Time: 01/11/18  3:30 PM  Result Value Ref Range   specimen status report Comment     Comment: Isac Caddy CMP14 Default Ambig Abbrev CMP14 Default A hand-written panel/profile was received from your office. In accordance with the [facility] Ambiguous Test Code Policy dated July 4098, we have completed your order by using the closest currently or formerly recognized AMA panel.  We have assigned Comprehensive Metabolic Panel (14), Test Code #322000 to this request.  If this is not the testing you wished to receive on this specimen, please contact the [facility] Client Inquiry/Technical Services Department to clarify the test order.  We appreciate your business.     Assessment & Plan:   1. Uncontrolled type 2 diabetes mellitus with complication, with long-term current use of insulin ([place])  - his diabetes is  complicated by morbid obesity and sleep apnea and patient remains at a high risk for more acute and chronic complications of diabetes which include CAD, CVA, CKD, retinopathy, and neuropathy. These are all discussed in detail with the patient.  Patient came with continued improvement in his blood glucose profile to near target range is, A1c of 7.2% progressively improving from 9.8%.   -  Glucose logs  and insulin administration records pertaining to this visit,  to be scanned into patient's records.  Recent labs reviewed.   - I have re-counseled the patient on diet management and weight loss  by adopting a carbohydrate restricted / protein rich  Diet.  -  Suggestion is made for him to avoid simple carbohydrates  from his diet including Cakes, Sweet Desserts / Pastries, Ice Cream, Soda (diet and regular), Sweet Tea, Candies, Chips, Cookies, Store Bought Juices, Alcohol in Excess of  1-2 drinks a day, Artificial Sweeteners, and "Sugar-free" Products. This will help patient to have stable blood glucose profile and potentially avoid unintended weight gain.   - Patient is advised to stick to a routine mealtimes to eat 3 meals  a day and avoid unnecessary snacks ( to snack only to correct hypoglycemia).   - I have approached patient with the following individualized plan to manage diabetes and patient agrees.  -  His CGM snapshot analysis shows 68% time in range, 14% above target and 18% below target mainly in the morning. -  I advised him to lower his Basaglar to 80 units nightly, and increase Humalog to 25 units 3 times daily before meals  for pre-meal BG readings of 90-150mg /dl, plus patient specific correction dose of rapid acting insulin for unexpected hyperglycemia above 150mg /dl, associated with strict documentation of blood glucose 4 times a day-before meals and at bedtime.   -Adjustment parameters for hypo and hyperglycemia were given in a written document to patient. -Patient is encouraged to call clinic for blood glucose levels less than 70 or above 300 mg /dl. -He will continue on Victoza 1.8 mg subcutaneous  daily, samples given from clinic. -I will continue metformin 500mg  po twice a day with meals.  - Patient specific target  for A1c; LDL, HDL, Triglycerides, and  Waist Circumference were discussed in detail.  2) BP/HTN : His blood pressure is controlled to target.  I advised him to  continue his current blood pressure medications.  3) Lipids/HPL: I advised him to continue atorvastatin 80 mg p.o. nightly.   4)  Weight/Diet:  No significant  success in weight loss, admits to dietary indiscretion. CDE consult in progress, exercise, and carbohydrates information provided.  5) Chronic Care/Health Maintenance:  -Patient is on ACEI/ARB and Statin medications and encouraged to continue to follow up with Ophthalmology, Podiatrist at least yearly or according to recommendations, and advised to  stay away from smoking. I have recommended yearly flu vaccine and pneumonia vaccination at least every 5 years; moderate intensity exercise for up to 150 minutes weekly; and  sleep for at least 7 hours a day.  - I advised patient to maintain close follow up with [name], MD for primary care needs. - Time spent with the patient: 25 min, of which >50% was spent in reviewing his blood glucose logs , discussing his hypo- and hyper-glycemic episodes, reviewing his current and  previous labs and insulin doses and developing a plan to avoid hypo- and hyper-glycemia. Please refer to Patient Instructions for Blood Glucose Monitoring and Insulin/Medications Dosing Guide"  in media tab for additional information. Kenneth Patrick participated in the discussions, expressed understanding, and voiced agreement with the above plans.  All questions were answered to his satisfaction. he is encouraged to contact clinic should he have any questions or concerns prior to his return visit.   Follow up plan: -Return in about 3 months (around 04/21/2018) for follow up with pre-visit labs, meter, and logs.  Glade Lloyd, MD Phone: [phone]  Fax: [phone]   -  This note was partially dictated with voice recognition software. Similar sounding words can be transcribed inadequately or may not  be corrected upon review.  01/20/2018, 5:50 PM

## 2018-01-20 NOTE — Patient Instructions (Signed)

## 2018-02-25 DIAGNOSIS — E1142 Type 2 diabetes mellitus with diabetic polyneuropathy: Secondary | ICD-10-CM | POA: Diagnosis not present

## 2018-02-25 DIAGNOSIS — Z6841 Body Mass Index (BMI) 40.0 and over, adult: Secondary | ICD-10-CM | POA: Diagnosis not present

## 2018-02-25 DIAGNOSIS — Z299 Encounter for prophylactic measures, unspecified: Secondary | ICD-10-CM | POA: Diagnosis not present

## 2018-02-25 DIAGNOSIS — E1165 Type 2 diabetes mellitus with hyperglycemia: Secondary | ICD-10-CM | POA: Diagnosis not present

## 2018-02-25 DIAGNOSIS — J449 Chronic obstructive pulmonary disease, unspecified: Secondary | ICD-10-CM | POA: Diagnosis not present

## 2018-02-27 ENCOUNTER — Other Ambulatory Visit: Payer: Self-pay | Admitting: Adult Health

## 2018-02-27 ENCOUNTER — Encounter: Payer: Self-pay | Admitting: Pulmonary Disease

## 2018-03-01 MED ORDER — TIOTROPIUM BROMIDE MONOHYDRATE 2.5 MCG/ACT IN AERS: 2.0000 | INHALATION_SPRAY | Freq: Every day | RESPIRATORY_TRACT | 2 refills | Status: DC

## 2018-03-01 NOTE — Telephone Encounter (Signed)
Patient sent in email requesting Rx refill of Spiriva. Sent in Rx to preferred pharmacy and responded to patient email. Nothing further is needed at this time.

## 2018-03-18 ENCOUNTER — Ambulatory Visit: Payer: Medicare Other | Admitting: Adult Health

## 2018-03-22 ENCOUNTER — Ambulatory Visit (INDEPENDENT_AMBULATORY_CARE_PROVIDER_SITE_OTHER): Payer: Medicare Other | Admitting: Adult Health

## 2018-03-22 ENCOUNTER — Encounter: Payer: Self-pay | Admitting: Adult Health

## 2018-03-22 DIAGNOSIS — G4733 Obstructive sleep apnea (adult) (pediatric): Secondary | ICD-10-CM | POA: Diagnosis not present

## 2018-03-22 DIAGNOSIS — J449 Chronic obstructive pulmonary disease, unspecified: Secondary | ICD-10-CM

## 2018-03-22 MED ORDER — TIOTROPIUM BROMIDE MONOHYDRATE 2.5 MCG/ACT IN AERS: 2.0000 | INHALATION_SPRAY | Freq: Every day | RESPIRATORY_TRACT | 0 refills | Status: DC

## 2018-03-22 MED ORDER — FLUTICASONE FUROATE-VILANTEROL 100-25 MCG/INH IN AEPB: 1.0000 | INHALATION_SPRAY | Freq: Every day | RESPIRATORY_TRACT | 0 refills | Status: DC

## 2018-03-22 NOTE — Progress Notes (Signed)
@Patient  ID: Kenneth Patrick, male    DOB: 1950/11/05, 67 y.o.   MRN: [id number]  Chief Complaint  Patient presents with  . 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   Meds -12/2012 -Added spiriva - self dc'd -Does not feel he needs it, thought it caused leg swelling. 06/2013 -Breo in place of Symbicort   Admitted Feb 2015 -for acute hypoxic respiratory failure and AKI/metabolic acidosis in the setting of RSV bronchitis.  CT chest showed bilateral lower lobe bronchiectasis.  2-D echo showed grade 1 diastolic dysfunction with preserved EF.  Had a low titer c-ANCA , Positive  Seen by ID , HIV neg.  Histoplasma ag positive in urine , but serology neg . (lived in [place] 10 yrs ago)  03/22/2018 Follow up ; COPD and OSA  Patient presents for a 16-month follow-up.  Patient says overall breathing is doing okay.  He remains on Breo and Spiriva.  He denies any flare of cough or wheezing. No increased albuterol use. Gets winded with heavy activity .  Prevnar and PVX are utd.   Patient has underlying sleep apnea.  Says he is doing well on CPAP.  He denies any increased daytime sleepiness.  Patient is on CPAP 14 cm H2O.  Patient has excellent compliance with average usage at 7.5 hours.  AHI 0.5.  Patient has diastolic dysfunction on Lasix 40 mg . Takes As needed  , mostly 3 times a week. .  Denies any increased leg swelling or weight gain. Down 9lbs since last ov .    Allergies  Allergen Reactions  . Penicillins     Childhood allergy     Immunization History  Administered Date(s) Administered  . Influenza Split 10/23/2011  . Influenza Whole 09/04/2010  . Influenza,inj,Quad PF,6+ Mos 06/08/2013, 09/03/2014, 07/31/2015, 06/22/2016  . Pneumococcal Conjugate-13  03/05/2015  . Pneumococcal Polysaccharide-23 05/14/2011, 11/27/2013    Past Medical History:  Diagnosis Date  . Asthma   . Depression   . Diabetic neuropathy ([place])   . DM2 (diabetes mellitus, type 2) ([place])   . Dyspnea   . Hypercholesterolemia   . Low back pain   . Respiratory infection   . Sleep apnea     Tobacco History: Social History   Tobacco Use  Smoking Status Former Smoker  . Packs/day: 1.50  . Years: 20.00  . Pack years: 30.00  . Last attempt to quit: 10/05/2004  . Years since quitting: 13.4  Smokeless Tobacco Never Used   Counseling given: Not Answered   Outpatient Encounter Medications as of 03/22/2018  Medication Sig  . albuterol (VENTOLIN HFA) 108 (90 BASE) MCG/ACT inhaler Inhale 2 puffs into the lungs every 6 (six) hours as needed.    [name] atorvastatin (LIPITOR) 80 MG tablet Take 80 mg by mouth At bedtime.   . Continuous Blood Gluc Sensor (FREESTYLE LIBRE SENSOR SYSTEM) MISC Use one sensor every 10 days.  . fluticasone furoate-vilanterol (BREO ELLIPTA) 100-25 MCG/INH AEPB USE 1 INHALATION DAILY  . furosemide (LASIX) 40 MG tablet Take 40 mg by mouth daily as needed for fluid.   [name] gabapentin (NEURONTIN) 300 MG capsule Take 300 mg by mouth 2 (two) times daily.   . Insulin Glargine (BASAGLAR KWIKPEN) 100 UNIT/ML SOPN INJECT  SUBCUTANEOUSLY 90  UNITS AT BEDTIME  . insulin lispro (HUMALOG) 100 UNIT/ML injection INJECT SUBCUTANEOUSLY UP TO 78 UNITS DAILY  . ipratropium-albuterol (DUONEB) 0.5-2.5 (3) MG/3ML SOLN Take 3 mLs by nebulization every 6 (six) hours as needed. DX: J44.9  . liraglutide (VICTOZA) 18 MG/3ML SOPN INJECT SUBCUTANEOUSLY 1.8MG  DAILY  . metFORMIN (GLUCOPHAGE) 500 MG tablet Take 1 tablet (500 mg total) by mouth 2 (two) times daily.  Glory Rosebush VERIO test strip TEST AS DIRECTED 4 TIMES  DAILY  . oxymetazoline (AFRIN) 0.05 % nasal spray Place 1-2 sprays into the nose 2 (two) times daily as needed.   . potassium chloride SA (KLOR-CON M20) 20 MEQ tablet Take  1 tablet (20 mEq total) by mouth daily.  . sertraline (ZOLOFT) 100 MG tablet Take 200 mg by mouth daily.   . Tiotropium Bromide Monohydrate (SPIRIVA RESPIMAT) 2.5 MCG/ACT AERS Inhale 2 puffs into the lungs daily.  . fluticasone furoate-vilanterol (BREO ELLIPTA) 100-25 MCG/INH AEPB Inhale 1 puff into the lungs daily.  . Tiotropium Bromide Monohydrate (SPIRIVA RESPIMAT) 2.5 MCG/ACT AERS Inhale 2 puffs into the lungs daily.  . [DISCONTINUED] KLOR-CON M20 20 MEQ tablet   . [DISCONTINUED] SPIRIVA RESPIMAT 2.5 MCG/ACT AERS   . [DISCONTINUED] Tiotropium Bromide Monohydrate (SPIRIVA RESPIMAT) 1.25 MCG/ACT AERS Inhale 2 puffs into the lungs daily. Patient was given 1.77mcg due to the office not having 2.9mcg (Patient not taking: Reported on 03/22/2018)   No facility-administered encounter medications on file as of 03/22/2018.      Review of Systems  Constitutional:   No  weight loss, night sweats,  Fevers, chills,  +fatigue, or  lassitude.  HEENT:   No headaches,  Difficulty swallowing,  Tooth/dental problems, or  Sore throat,                No sneezing, itching, ear ache, nasal congestion, post nasal drip,   CV:  No chest pain,  Orthopnea, PND, swelling in lower extremities, anasarca, dizziness, palpitations, syncope.   GI  No heartburn, indigestion, abdominal pain, nausea, vomiting, diarrhea, change in bowel habits, loss of appetite, bloody stools.   Resp:    No chest wall deformity  Skin: no rash or lesions.  GU: no dysuria, change in color of urine, no urgency or frequency.  No flank pain, no hematuria   MS:  No joint pain or swelling.  No decreased range of motion.  No back pain.    Physical Exam  BP (!) 142/70 (BP Location: Left Arm, Cuff Size: Large)   Pulse 92   Ht 6\' 4"  (1.93 m)   Wt (!) 381 lb 9.6 oz (173.1 kg)   SpO2 95%   BMI 46.45 kg/m   GEN: A/Ox3; pleasant , NAD, obese    HEENT:  [place]/AT,  EACs-clear, TMs-wnl, NOSE-clear, THROAT-clear, no lesions, no postnasal drip or  exudate noted.   NECK:  Supple w/ fair ROM; no JVD; normal carotid impulses w/o bruits; no thyromegaly or nodules palpated; no lymphadenopathy.    RESP  Clear  P & A; w/o, wheezes/ rales/ or rhonchi. no accessory muscle use, no dullness to percussion  CARD:  RRR, no m/r/g, 2+ peripheral edema, pulses intact, no cyanosis or clubbing.  GI:   Soft & nt; nml bowel sounds; no organomegaly or masses detected.   Musco: Warm bil, no deformities or joint swelling noted.   Neuro: alert, no focal deficits noted.    Skin: Warm, no lesions or rashes    Lab Results:  CBC  BNP  No results found for: BNP   Imaging: No results found.   Assessment & Plan:   COPD (chronic obstructive pulmonary disease) Controlled on current regimen   Plan  Patient Instructions  Continue on BREO 1 puff daily, rinse after use .  Continue Spriiva 2 puffs daily .  Continue on CPAP At bedtime   Work on healthy weight.  Follow up with Dr. Elsworth Soho  In 3 -4 months and as needed Please contact office for sooner follow up if symptoms do not improve or worsen or seek emergency care       OSA (obstructive sleep apnea) Cont on CPAP At bedtime  Work on healthy weight   Morbid obesity due to excess calories ([place]) Wt loss      Rexene Edison, NP 03/22/2018

## 2018-03-22 NOTE — Assessment & Plan Note (Signed)
Cont on CPAP At bedtime  Work on healthy weight

## 2018-03-22 NOTE — Assessment & Plan Note (Signed)
Controlled on current regimen   Plan  Patient Instructions  Continue on BREO 1 puff daily, rinse after use .  Continue Spriiva 2 puffs daily .  Continue on CPAP At bedtime   Work on healthy weight.  Follow up with Dr. Elsworth Soho  In 3 -4 months and as needed Please contact office for sooner follow up if symptoms do not improve or worsen or seek emergency care

## 2018-03-22 NOTE — Assessment & Plan Note (Signed)
Wt loss  

## 2018-03-22 NOTE — Patient Instructions (Addendum)
Continue on BREO 1 puff daily, rinse after use .  Continue Spriiva 2 puffs daily .  Continue on CPAP At bedtime   Work on healthy weight.  Follow up with Dr. Elsworth Soho  In 3 -4 months and as needed Please contact office for sooner follow up if symptoms do not improve or worsen or seek emergency care

## 2018-04-14 ENCOUNTER — Other Ambulatory Visit: Payer: Self-pay | Admitting: "Endocrinology

## 2018-04-14 DIAGNOSIS — E1165 Type 2 diabetes mellitus with hyperglycemia: Secondary | ICD-10-CM | POA: Diagnosis not present

## 2018-04-14 DIAGNOSIS — Z794 Long term (current) use of insulin: Secondary | ICD-10-CM | POA: Diagnosis not present

## 2018-04-14 DIAGNOSIS — E118 Type 2 diabetes mellitus with unspecified complications: Secondary | ICD-10-CM | POA: Diagnosis not present

## 2018-04-15 LAB — COMPREHENSIVE METABOLIC PANEL
A/G RATIO: 1.6 (ref 1.2–2.2)
ALK PHOS: 124 IU/L — AB (ref 39–117)
ALT: 36 IU/L (ref 0–44)
AST: 66 IU/L — ABNORMAL HIGH (ref 0–40)
Albumin: 4.2 g/dL (ref 3.6–4.8)
BILIRUBIN TOTAL: 1.2 mg/dL (ref 0.0–1.2)
BUN / CREAT RATIO: 19 (ref 10–24)
BUN: 25 mg/dL (ref 8–27)
CO2: 21 mmol/L (ref 20–29)
CREATININE: 1.31 mg/dL — AB (ref 0.76–1.27)
Calcium: 9.1 mg/dL (ref 8.6–10.2)
Chloride: 99 mmol/L (ref 96–106)
GFR calc Af Amer: 65 mL/min/{1.73_m2} (ref >59)
GFR calc non Af Amer: 56 mL/min/{1.73_m2} — ABNORMAL LOW (ref >59)
GLOBULIN, TOTAL: 2.7 g/dL (ref 1.5–4.5)
Glucose: 176 mg/dL — ABNORMAL HIGH (ref 65–99)
POTASSIUM: 4.5 mmol/L (ref 3.5–5.2)
SODIUM: 142 mmol/L (ref 134–144)
Total Protein: 6.9 g/dL (ref 6.0–8.5)

## 2018-04-15 LAB — HGB A1C W/O EAG: HEMOGLOBIN A1C: 7.4 % — AB (ref 4.8–5.6)

## 2018-04-15 LAB — SPECIMEN STATUS REPORT

## 2018-04-21 ENCOUNTER — Ambulatory Visit: Payer: Medicare Other | Admitting: "Endocrinology

## 2018-05-19 ENCOUNTER — Ambulatory Visit (INDEPENDENT_AMBULATORY_CARE_PROVIDER_SITE_OTHER): Payer: Medicare Other | Admitting: "Endocrinology

## 2018-05-19 ENCOUNTER — Encounter: Payer: Self-pay | Admitting: "Endocrinology

## 2018-05-19 ENCOUNTER — Other Ambulatory Visit: Payer: Self-pay

## 2018-05-19 VITALS — BP 150/76 | HR 81 | Ht 76.0 in | Wt 391.0 lb

## 2018-05-19 DIAGNOSIS — E118 Type 2 diabetes mellitus with unspecified complications: Secondary | ICD-10-CM

## 2018-05-19 DIAGNOSIS — Z794 Long term (current) use of insulin: Secondary | ICD-10-CM

## 2018-05-19 DIAGNOSIS — E782 Mixed hyperlipidemia: Secondary | ICD-10-CM | POA: Diagnosis not present

## 2018-05-19 DIAGNOSIS — IMO0002 Reserved for concepts with insufficient information to code with codable children: Secondary | ICD-10-CM

## 2018-05-19 DIAGNOSIS — I10 Essential (primary) hypertension: Secondary | ICD-10-CM

## 2018-05-19 DIAGNOSIS — E1165 Type 2 diabetes mellitus with hyperglycemia: Secondary | ICD-10-CM

## 2018-05-19 MED ORDER — INSULIN DETEMIR 100 UNIT/ML FLEXPEN: 70.0000 [IU] | PEN_INJECTOR | Freq: Every day | SUBCUTANEOUS | 2 refills | Status: DC

## 2018-05-19 NOTE — Patient Instructions (Signed)

## 2018-05-19 NOTE — Progress Notes (Signed)
Endocrinology follow-up note   Subjective:    Patient ID: Kenneth Patrick, male    DOB: [birth year], PCP [name], MD   Past Medical History:  Diagnosis Date  . Asthma   . Depression   . Diabetic neuropathy ([place])   . DM2 (diabetes mellitus, type 2) ([place])   . Dyspnea   . Hypercholesterolemia   . Low back pain   . Respiratory infection   . Sleep apnea    Past Surgical History:  Procedure Laterality Date  . CHOLECYSTECTOMY    . left eye enucleation following trauma     Social History   Socioeconomic History  . Marital status: Married    Spouse name: Not on file  . Number of children: Not on file  . Years of education: Not on file  . Highest education level: Not on file  Occupational History  . Occupation: retired Engineer, structural  Social Needs  . Financial resource strain: Not on file  . Food insecurity:    Worry: Not on file    Inability: Not on file  . Transportation needs:    Medical: Not on file    Non-medical: Not on file  Tobacco Use  . Smoking status: Former Smoker    Packs/day: 1.50    Years: 20.00    Pack years: 30.00    Last attempt to quit: 10/05/2004    Years since quitting: 13.6  . Smokeless tobacco: Never Used  Substance and Sexual Activity  . Alcohol use: No    Alcohol/week: 0.0 standard drinks  . Drug use: No    Comment: stopped smoking marijuana in 2006  . Sexual activity: Not on file  Lifestyle  . Physical activity:    Days per week: Not on file    Minutes per session: Not on file  . Stress: Not on file  Relationships  . Social connections:    Talks on phone: Not on file    Gets together: Not on file    Attends religious service: Not on file    Active member of club or organization: Not on file    Attends meetings of clubs or organizations: Not on file    Relationship status: Not on file  Other Topics Concern  . Not on file  Social History Narrative  . Not on file   Outpatient Encounter Medications as of 05/19/2018   Medication Sig  . albuterol (VENTOLIN HFA) 108 (90 BASE) MCG/ACT inhaler Inhale 2 puffs into the lungs every 6 (six) hours as needed.    [name] atorvastatin (LIPITOR) 80 MG tablet Take 80 mg by mouth At bedtime.   . Continuous Blood Gluc Sensor (FREESTYLE LIBRE SENSOR SYSTEM) MISC Use one sensor every 10 days.  . fluticasone furoate-vilanterol (BREO ELLIPTA) 100-25 MCG/INH AEPB USE 1 INHALATION DAILY  . furosemide (LASIX) 40 MG tablet Take 40 mg by mouth daily as needed for fluid.   [name] gabapentin (NEURONTIN) 300 MG capsule Take 300 mg by mouth 2 (two) times daily.   . insulin lispro (HUMALOG) 100 UNIT/ML injection INJECT SUBCUTANEOUSLY UP TO 78 UNITS DAILY  . ipratropium-albuterol (DUONEB) 0.5-2.5 (3) MG/3ML SOLN Take 3 mLs by nebulization every 6 (six) hours as needed. DX: J44.9  . liraglutide (VICTOZA) 18 MG/3ML SOPN INJECT SUBCUTANEOUSLY 1.8MG  DAILY  . ONETOUCH VERIO test strip TEST AS DIRECTED 4 TIMES  DAILY  . oxymetazoline (AFRIN) 0.05 % nasal spray Place 1-2 sprays into the nose 2 (two) times daily as needed.   [name]  potassium chloride SA (KLOR-CON M20) 20 MEQ tablet Take 1 tablet (20 mEq total) by mouth daily.  . sertraline (ZOLOFT) 100 MG tablet Take 200 mg by mouth daily.   . Tiotropium Bromide Monohydrate (SPIRIVA RESPIMAT) 2.5 MCG/ACT AERS Inhale 2 puffs into the lungs daily.  . [DISCONTINUED] fluticasone furoate-vilanterol (BREO ELLIPTA) 100-25 MCG/INH AEPB Inhale 1 puff into the lungs daily.  . [DISCONTINUED] Insulin Detemir (LEVEMIR FLEXTOUCH) 100 UNIT/ML Pen Inject 70 Units into the skin at bedtime.  . [DISCONTINUED] Insulin Glargine (BASAGLAR KWIKPEN) 100 UNIT/ML SOPN INJECT SUBCUTANEOUSLY 90  UNITS AT BEDTIME  . [DISCONTINUED] metFORMIN (GLUCOPHAGE) 500 MG tablet Take 1 tablet (500 mg total) by mouth 2 (two) times daily.  . [DISCONTINUED] Tiotropium Bromide Monohydrate (SPIRIVA RESPIMAT) 2.5 MCG/ACT AERS Inhale 2 puffs into the lungs daily.   No facility-administered encounter  medications on file as of 05/19/2018.    ALLERGIES: Allergies  Allergen Reactions  . Penicillins     Childhood allergy    VACCINATION STATUS: Immunization History  Administered Date(s) Administered  . Influenza Split 10/23/2011  . Influenza Whole 09/04/2010  . Influenza,inj,Quad PF,6+ Mos 06/08/2013, 09/03/2014, 07/31/2015, 06/22/2016  . Pneumococcal Conjugate-13 03/05/2015  . Pneumococcal Polysaccharide-23 05/14/2011, 11/27/2013    Diabetes  He presents for his follow-up diabetic visit. He has type 2 diabetes mellitus. Onset time: He was diagnosed at approximate age of 36 years. His disease course has been stable. There are no hypoglycemic associated symptoms. Pertinent negatives for hypoglycemia include no confusion, headaches, pallor or seizures. There are no diabetic associated symptoms. Pertinent negatives for diabetes include no chest pain, no fatigue, no polydipsia, no polyphagia, no polyuria and no weakness. There are no hypoglycemic complications. Symptoms are stable. There are no diabetic complications. Risk factors for coronary artery disease include diabetes mellitus, dyslipidemia, hypertension, male sex, obesity, sedentary lifestyle and tobacco exposure. Current diabetic treatment includes insulin injections. He is compliant with treatment some of the time. His weight is fluctuating minimally. He is following a generally unhealthy diet. When asked about meal planning, he reported none. He has had a previous visit with a dietitian. He never participates in exercise. His home blood glucose trend is decreasing steadily. His breakfast blood glucose range is generally 140-180 mg/dl. His lunch blood glucose range is generally 140-180 mg/dl. His dinner blood glucose range is generally 140-180 mg/dl. His bedtime blood glucose range is generally 140-180 mg/dl. His overall blood glucose range is 140-180 mg/dl. Eye exam is current.  Hyperlipidemia  This is a chronic problem. The current  episode started more than 1 year ago. The problem is uncontrolled. Exacerbating diseases include diabetes and obesity. Pertinent negatives include no chest pain, myalgias or shortness of breath. Current antihyperlipidemic treatment includes statins. Compliance problems include adherence to diet and adherence to exercise.  Risk factors for coronary artery disease include dyslipidemia, diabetes mellitus, obesity, male sex and a sedentary lifestyle.  Hypertension  This is a chronic problem. The current episode started more than 1 year ago. The problem is controlled. Pertinent negatives include no chest pain, headaches, neck pain, palpitations or shortness of breath. Risk factors for coronary artery disease include diabetes mellitus, dyslipidemia, male gender, obesity, smoking/tobacco exposure and sedentary lifestyle. Compliance problems include diet and exercise.      Review of Systems  Constitutional: Negative for chills, fatigue, fever and unexpected weight change.  HENT: Negative for dental problem, mouth sores and trouble swallowing.   Eyes: Negative for visual disturbance.  Respiratory: Negative for cough, choking, chest tightness, shortness of  breath and wheezing.   Cardiovascular: Negative for chest pain, palpitations and leg swelling.  Gastrointestinal: Negative for abdominal distention, abdominal pain, constipation, diarrhea, nausea and vomiting.  Endocrine: Negative for polydipsia, polyphagia and polyuria.  Genitourinary: Negative for dysuria, flank pain, hematuria and urgency.  Musculoskeletal: Negative for back pain, gait problem, myalgias and neck pain.  Skin: Negative for pallor, rash and wound.  Neurological: Negative for seizures, syncope, weakness, numbness and headaches.  Psychiatric/Behavioral: Negative.  Negative for confusion and dysphoric mood.    Objective:    BP (!) 150/76   Pulse 81   Ht 6\' 4"  (1.93 m)   Wt (!) 391 lb (177.4 kg)   BMI 47.59 kg/m   Wt Readings from  Last 3 Encounters:  05/19/18 (!) 391 lb (177.4 kg)  03/22/18 (!) 381 lb 9.6 oz (173.1 kg)  01/20/18 (!) 390 lb (176.9 kg)    Physical Exam  Constitutional: He is oriented to person, place, and time. He appears well-developed. He is cooperative. No distress.  HENT:  Head: Normocephalic and atraumatic.  Eyes:  He wears dark heavy glasses due to reported prior injury to eyes.  Neck: Normal range of motion. Neck supple. No tracheal deviation present. No thyromegaly present.  Cardiovascular: Normal rate, S1 normal and S2 normal. Exam reveals no gallop.  No murmur heard. Pulses:      Dorsalis pedis pulses are 1+ on the right side, and 1+ on the left side.       Posterior tibial pulses are 1+ on the right side, and 1+ on the left side.  Pulmonary/Chest: Effort normal. No respiratory distress. He has no wheezes.  Abdominal: He exhibits no distension. There is no tenderness. There is no guarding and no CVA tenderness.  Musculoskeletal: He exhibits no edema.       Right shoulder: He exhibits no swelling and no deformity.  Neurological: He is alert and oriented to person, place, and time. He has normal strength. No cranial nerve deficit or sensory deficit. Gait normal.  Skin: Skin is warm and dry. No rash noted. No cyanosis. Nails show no clubbing.  Psychiatric: He has a normal mood and affect. His speech is normal. Judgment normal. Cognition and memory are normal.    Diabetic Labs (most recent): Lab Results  Component Value Date   HGBA1C 7.4 (H) 04/14/2018   HGBA1C 7.2 (H) 01/11/2018   HGBA1C 7.0 (H) 10/11/2017   Recent Results (from the past 2160 hour(s))  Comprehensive metabolic panel     Status: Abnormal   Collection Time: 04/14/18  2:57 PM  Result Value Ref Range   Glucose 176 (H) 65 - 99 mg/dL   BUN 25 8 - 27 mg/dL   Creatinine, Ser 1.31 (H) 0.76 - 1.27 mg/dL   GFR calc non Af Amer 56 (L) >59 mL/min/1.73   GFR calc Af Amer 65 >59 mL/min/1.73   BUN/Creatinine Ratio 19 10 - 24    Sodium 142 134 - 144 mmol/L   Potassium 4.5 3.5 - 5.2 mmol/L   Chloride 99 96 - 106 mmol/L   CO2 21 20 - 29 mmol/L   Calcium 9.1 8.6 - 10.2 mg/dL   Total Protein 6.9 6.0 - 8.5 g/dL   Albumin 4.2 3.6 - 4.8 g/dL   Globulin, Total 2.7 1.5 - 4.5 g/dL   Albumin/Globulin Ratio 1.6 1.2 - 2.2   Bilirubin Total 1.2 0.0 - 1.2 mg/dL   Alkaline Phosphatase 124 (H) 39 - 117 IU/L   AST 66 (H) 0 -  40 IU/L   ALT 36 0 - 44 IU/L  Hgb A1c w/o eAG     Status: Abnormal   Collection Time: 04/14/18  2:57 PM  Result Value Ref Range   Hgb A1c MFr Bld 7.4 (H) 4.8 - 5.6 %    Comment:          Prediabetes: 5.7 - 6.4          Diabetes: >6.4          Glycemic control for adults with diabetes: <7.0   Specimen status report     Status: None   Collection Time: 04/14/18  2:57 PM  Result Value Ref Range   specimen status report Comment     Comment: Isac Caddy CMP14 Default Ambig Abbrev CMP14 Default A hand-written panel/profile was received from your office. In accordance with the [facility] Ambiguous Test Code Policy dated July 0938, we have completed your order by using the closest currently or formerly recognized AMA panel.  We have assigned Comprehensive Metabolic Panel (14), Test Code #322000 to this request.  If this is not the testing you wished to receive on this specimen, please contact the [facility] Client Inquiry/Technical Services Department to clarify the test order.  We appreciate your business.    Lipid Panel     Component Value Date/Time   CHOL 165 07/12/2017 1324   TRIG 100 07/12/2017 1324   HDL 46 07/12/2017 1324   CHOLHDL 4.0 06/26/2016 1403   VLDL 22 06/26/2016 1403   LDLCALC 99 07/12/2017 1324     Assessment & Plan:   1. Uncontrolled type 2 diabetes mellitus with complication, with long-term current use of insulin ([place])  -His diabetes is  complicated by morbid obesity and sleep apnea and patient remains at a high risk for more acute and chronic complications of diabetes which  include CAD, CVA, CKD, retinopathy, and neuropathy. These are all discussed in detail with the patient.  He came with stable glycemic profile wearing his CGM device, A1c was 7.4%, progressively improving from 9.8%.      -  Glucose logs and insulin administration records pertaining to this visit,  to be scanned into patient's records.  Recent labs reviewed.   - I have re-counseled the patient on diet management and weight loss  by adopting a carbohydrate restricted / protein rich  Diet.  -  Suggestion is made for him to avoid simple carbohydrates  from his diet including Cakes, Sweet Desserts / Pastries, Ice Cream, Soda (diet and regular), Sweet Tea, Candies, Chips, Cookies, Store Bought Juices, Alcohol in Excess of  1-2 drinks a day, Artificial Sweeteners, and "Sugar-free" Products. This will help patient to have stable blood glucose profile and potentially avoid unintended weight gain.   - Patient is advised to stick to a routine mealtimes to eat 3 meals  a day and avoid unnecessary snacks ( to snack only to correct hypoglycemia).   - I have approached patient with the following individualized plan to manage diabetes and patient agrees.  -  His CGM snapshot analysis shows 71% % time in range, 25% above target and 4% below target mainly in the morning. -His significant improvement from his last visit. -  I advised him to continue Basaglar (requesting a prescription for Levemir instead of patella) 70 units nightly, continue Humalog  25 units 3 times daily before meals  for pre-meal BG readings of 90-150mg /dl, plus patient specific correction dose of rapid acting insulin for unexpected hyperglycemia above 150mg /dl, associated with strict  documentation of blood glucose 4 times a day-before meals and at bedtime.   -Adjustment parameters for hypo and hyperglycemia were given in a written document to patient. -Patient is encouraged to call clinic for blood glucose levels less than 70 or above 300 mg  /dl. -He will continue on Victoza 1.8 mg subcutaneous daily, samples given from clinic. -Due to moderate renal insufficiency, I advised him to hold metformin for now.    - Patient specific target  for A1c; LDL, HDL, Triglycerides, and  Waist Circumference were discussed in detail.  2) BP/HTN : His blood pressure is not controlled to target.    I advised him to continue his current blood pressure medications.  3) Lipids/HPL: His recent lipid panel showed uncontrolled LDL at 99.  He is advised to continue atorvastatin 80 mg nightly.    4)  Weight/Diet:  No significant  success in weight loss, admits to dietary indiscretion. CDE consult in progress, exercise, and carbohydrates information provided.  5) Chronic Care/Health Maintenance:  -Patient is on ACEI/ARB and Statin medications and encouraged to continue to follow up with Ophthalmology, Podiatrist at least yearly or according to recommendations, and advised to  stay away from smoking. I have recommended yearly flu vaccine and pneumonia vaccination at least every 5 years; moderate intensity exercise for up to 150 minutes weekly; and  sleep for at least 7 hours a day.  - I advised patient to maintain close follow up with [name], MD for primary care needs.  - Time spent with the patient: 25 min, of which >50% was spent in reviewing his blood glucose logs , discussing his hypo- and hyper-glycemic episodes, reviewing his current and  previous labs and insulin doses and developing a plan to avoid hypo- and hyper-glycemia. Please refer to Patient Instructions for Blood Glucose Monitoring and Insulin/Medications Dosing Guide"  in media tab for additional information. Kenneth Patrick participated in the discussions, expressed understanding, and voiced agreement with the above plans.  All questions were answered to his satisfaction. he is encouraged to contact clinic should he have any questions or concerns prior to his return visit.  Follow up  plan: -Return in about 3 months (around 08/19/2018) for Meter, and Logs, Follow up with Pre-visit Labs, Meter, and Logs.  Glade Lloyd, MD Phone: [phone]  Fax: [phone]   -  This note was partially dictated with voice recognition software. Similar sounding words can be transcribed inadequately or may not  be corrected upon review.  05/19/2018, 5:45 PM

## 2018-06-29 ENCOUNTER — Encounter: Payer: Self-pay | Admitting: Pulmonary Disease

## 2018-06-29 ENCOUNTER — Ambulatory Visit (INDEPENDENT_AMBULATORY_CARE_PROVIDER_SITE_OTHER): Payer: Medicare Other | Admitting: Pulmonary Disease

## 2018-06-29 DIAGNOSIS — Z23 Encounter for immunization: Secondary | ICD-10-CM | POA: Diagnosis not present

## 2018-06-29 DIAGNOSIS — G4733 Obstructive sleep apnea (adult) (pediatric): Secondary | ICD-10-CM | POA: Diagnosis not present

## 2018-06-29 DIAGNOSIS — J449 Chronic obstructive pulmonary disease, unspecified: Secondary | ICD-10-CM

## 2018-06-29 MED ORDER — FLUTICASONE-UMECLIDIN-VILANT 100-62.5-25 MCG/INH IN AEPB: 1.0000 | INHALATION_SPRAY | Freq: Every day | RESPIRATORY_TRACT | 0 refills | Status: DC

## 2018-06-29 NOTE — Patient Instructions (Signed)
Flu shot today. Sample of Trelegy-take this instead of Breo and Spiriva, call us if this works for prescription.

## 2018-06-29 NOTE — Assessment & Plan Note (Signed)
Flu shot today. Sample of Trelegy-take this instead of Breo and Spiriva, call us if this works for prescription

## 2018-06-29 NOTE — Progress Notes (Signed)
   Subjective:    Patient ID: Kenneth Patrick, male    DOB: [birth year], 67 y.o.   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  h/o 'asthma' many years ago, has used inhalers &diskus c/o wheezing on perfumes, cooking  He smoked >60 Pyrs before quitting in 2006.   Chief Complaint  Patient presents with  . Follow-up    mowed the lawn yesterday, has had increased sob since then.     Routine follow-up visit today.  He complains about the high co-pay with Breo and Spiriva and is in the donut hole.  Compliant with Lasix as needed, weight remains high at 391 pounds.  No orthopnea or paroxysmal nocturnal dyspnea  Compliant with CPAP and this helps improve his daytime somnolence and fatigue He mowed the lawn yesterday and did not use his mask and feels a little short of breath  Med history -  06/2013 -Breo in place of Symbicort  2017>>anoro did not work   Significant tests/ events  Spirometry 2011- severe airway obstruction with a low ratio &FEV1 of 25 %, good effort !  5/2018Spirometry-FEV1 of 41%, ratio 60, FVC 51%  Admitted Feb 2015 -for acute hypoxic respiratory failure and AKI due to RSV bronchitis.  CT chest showed bilateral lower lobe bronchiectasis.  Had a low titer c-ANCA , Positive  Seen by ID , HIV neg, Histoplasma ag positive in urine , but serology neg . (lived in [place] 10 yrs ago)   03/2007 CPAP 14 cm  Review of Systems neg for any significant sore throat, dysphagia, itching, sneezing, nasal congestion or excess/ purulent secretions, fever, chills, sweats, unintended wt loss, pleuritic or exertional cp, hempoptysis, orthopnea pnd or change in chronic leg swelling. Also denies presyncope, palpitations, heartburn, abdominal pain, nausea, vomiting, diarrhea or change in bowel or urinary habits, dysuria,hematuria, rash, arthralgias, visual complaints, headache, numbness weakness or ataxia.       Objective:   Physical Exam  Gen. Pleasant, obese, in no distress ENT - no lesions, no post nasal drip Neck: No JVD, no thyromegaly, no carotid bruits Lungs: no use of accessory muscles, no dullness to percussion, decreased without rales or rhonchi  Cardiovascular: Rhythm regular, heart sounds  normal, no murmurs or gallops, 1+ peripheral edema Musculoskeletal: No deformities, no cyanosis or clubbing , no tremors       Assessment & Plan:

## 2018-06-29 NOTE — Assessment & Plan Note (Signed)
He is compliant and CPAP is helping  Weight loss encouraged, compliance with goal of at least 4-6 hrs every night is the expectation. Advised against medications with sedative side effects Cautioned against driving when sleepy - understanding that sleepiness will vary on a day to day basis

## 2018-07-06 ENCOUNTER — Telehealth: Payer: Self-pay | Admitting: Pulmonary Disease

## 2018-07-06 MED ORDER — FLUTICASONE-UMECLIDIN-VILANT 100-62.5-25 MCG/INH IN AEPB: 1.0000 | INHALATION_SPRAY | Freq: Every day | RESPIRATORY_TRACT | 11 refills | Status: DC

## 2018-07-06 NOTE — Telephone Encounter (Signed)
Called and spoke with pt who stated Trelegy Sample that was given at [facility] has worked well for pt. Stated to pt I would d/c both spiriva and breo off of med list and send Rx of Trelegy in to pt's pharmacy.  Pt expressed understanding. Nothing further needed.

## 2018-08-17 ENCOUNTER — Other Ambulatory Visit: Payer: Self-pay | Admitting: "Endocrinology

## 2018-08-17 DIAGNOSIS — E118 Type 2 diabetes mellitus with unspecified complications: Secondary | ICD-10-CM | POA: Diagnosis not present

## 2018-08-17 DIAGNOSIS — Z794 Long term (current) use of insulin: Secondary | ICD-10-CM | POA: Diagnosis not present

## 2018-08-17 DIAGNOSIS — E1165 Type 2 diabetes mellitus with hyperglycemia: Secondary | ICD-10-CM | POA: Diagnosis not present

## 2018-08-18 LAB — COMPREHENSIVE METABOLIC PANEL
ALBUMIN: 3.8 g/dL (ref 3.6–4.8)
ALK PHOS: 139 IU/L — AB (ref 39–117)
ALT: 50 IU/L — ABNORMAL HIGH (ref 0–44)
AST: 86 IU/L — ABNORMAL HIGH (ref 0–40)
Albumin/Globulin Ratio: 1.5 (ref 1.2–2.2)
BUN/Creatinine Ratio: 9 — ABNORMAL LOW (ref 10–24)
BUN: 14 mg/dL (ref 8–27)
Bilirubin Total: 1.4 mg/dL — ABNORMAL HIGH (ref 0.0–1.2)
CHLORIDE: 98 mmol/L (ref 96–106)
CO2: 24 mmol/L (ref 20–29)
CREATININE: 1.56 mg/dL — AB (ref 0.76–1.27)
Calcium: 8.8 mg/dL (ref 8.6–10.2)
GFR calc Af Amer: 52 mL/min/{1.73_m2} — ABNORMAL LOW (ref >59)
GFR, EST NON AFRICAN AMERICAN: 45 mL/min/{1.73_m2} — AB (ref >59)
GLOBULIN, TOTAL: 2.6 g/dL (ref 1.5–4.5)
Glucose: 147 mg/dL — ABNORMAL HIGH (ref 65–99)
Potassium: 4.2 mmol/L (ref 3.5–5.2)
SODIUM: 139 mmol/L (ref 134–144)
Total Protein: 6.4 g/dL (ref 6.0–8.5)

## 2018-08-18 LAB — HGB A1C W/O EAG: Hgb A1c MFr Bld: 9.6 % — ABNORMAL HIGH (ref 4.8–5.6)

## 2018-08-18 LAB — SPECIMEN STATUS REPORT

## 2018-08-25 ENCOUNTER — Ambulatory Visit (INDEPENDENT_AMBULATORY_CARE_PROVIDER_SITE_OTHER): Payer: Medicare Other | Admitting: "Endocrinology

## 2018-08-25 ENCOUNTER — Encounter: Payer: Self-pay | Admitting: "Endocrinology

## 2018-08-25 VITALS — BP 149/75 | HR 70 | Ht 76.0 in | Wt 388.0 lb

## 2018-08-25 DIAGNOSIS — E118 Type 2 diabetes mellitus with unspecified complications: Secondary | ICD-10-CM | POA: Diagnosis not present

## 2018-08-25 DIAGNOSIS — Z794 Long term (current) use of insulin: Secondary | ICD-10-CM

## 2018-08-25 DIAGNOSIS — IMO0002 Reserved for concepts with insufficient information to code with codable children: Secondary | ICD-10-CM

## 2018-08-25 DIAGNOSIS — E782 Mixed hyperlipidemia: Secondary | ICD-10-CM | POA: Diagnosis not present

## 2018-08-25 DIAGNOSIS — E1165 Type 2 diabetes mellitus with hyperglycemia: Secondary | ICD-10-CM

## 2018-08-25 MED ORDER — INSULIN DETEMIR 100 UNIT/ML FLEXPEN: 80.0000 [IU] | PEN_INJECTOR | Freq: Every day | SUBCUTANEOUS | 2 refills | Status: DC

## 2018-08-25 NOTE — Patient Instructions (Signed)

## 2018-08-25 NOTE — Progress Notes (Signed)
Endocrinology follow-up note   Subjective:    Patient ID: Kenneth Patrick, male    DOB: 04/29/1951, PCP [name], MD   Past Medical History:  Diagnosis Date  . Asthma   . Depression   . Diabetic neuropathy ([place])   . DM2 (diabetes mellitus, type 2) ([place])   . Dyspnea   . Hypercholesterolemia   . Low back pain   . Respiratory infection   . Sleep apnea    Past Surgical History:  Procedure Laterality Date  . CHOLECYSTECTOMY    . left eye enucleation following trauma     Social History   Socioeconomic History  . Marital status: Married    Spouse name: Not on file  . Number of children: Not on file  . Years of education: Not on file  . Highest education level: Not on file  Occupational History  . Occupation: retired Engineer, structural  Social Needs  . Financial resource strain: Not on file  . Food insecurity:    Worry: Not on file    Inability: Not on file  . Transportation needs:    Medical: Not on file    Non-medical: Not on file  Tobacco Use  . Smoking status: Former Smoker    Packs/day: 1.50    Years: 20.00    Pack years: 30.00    Last attempt to quit: 10/05/2004    Years since quitting: 13.8  . Smokeless tobacco: Never Used  Substance and Sexual Activity  . Alcohol use: No    Alcohol/week: 0.0 standard drinks  . Drug use: No    Comment: stopped smoking marijuana in 2006  . Sexual activity: Not on file  Lifestyle  . Physical activity:    Days per week: Not on file    Minutes per session: Not on file  . Stress: Not on file  Relationships  . Social connections:    Talks on phone: Not on file    Gets together: Not on file    Attends religious service: Not on file    Active member of club or organization: Not on file    Attends meetings of clubs or organizations: Not on file    Relationship status: Not on file  Other Topics Concern  . Not on file  Social History Narrative  . Not on file   Outpatient Encounter Medications as of 08/25/2018   Medication Sig  . insulin lispro (HUMALOG) 100 UNIT/ML KwikPen Inject 30-36 Units into the skin 3 (three) times daily before meals.  [name] albuterol (VENTOLIN HFA) 108 (90 BASE) MCG/ACT inhaler Inhale 2 puffs into the lungs every 6 (six) hours as needed.    [name] atorvastatin (LIPITOR) 80 MG tablet Take 80 mg by mouth At bedtime.   . Continuous Blood Gluc Sensor (FREESTYLE LIBRE SENSOR SYSTEM) MISC Use one sensor every 10 days.  . Fluticasone-Umeclidin-Vilant (TRELEGY ELLIPTA) 100-62.5-25 MCG/INH AEPB Inhale 1 puff into the lungs daily.  . furosemide (LASIX) 40 MG tablet Take 40 mg by mouth daily as needed for fluid.   [name] gabapentin (NEURONTIN) 300 MG capsule Take 300 mg by mouth 2 (two) times daily.   . Insulin Detemir (LEVEMIR FLEXTOUCH) 100 UNIT/ML Pen Inject 80 Units into the skin at bedtime.  . liraglutide (VICTOZA) 18 MG/3ML SOPN INJECT SUBCUTANEOUSLY 1.8MG  DAILY  . ONETOUCH VERIO test strip TEST AS DIRECTED 4 TIMES  DAILY  . oxymetazoline (AFRIN) 0.05 % nasal spray Place 1-2 sprays into the nose 2 (two) times daily as needed.   [name]  potassium chloride SA (KLOR-CON M20) 20 MEQ tablet Take 1 tablet (20 mEq total) by mouth daily.  . sertraline (ZOLOFT) 100 MG tablet Take 200 mg by mouth daily.   . [DISCONTINUED] Insulin Detemir (LEVEMIR FLEXTOUCH) 100 UNIT/ML Pen Inject 70 Units into the skin at bedtime.  . [DISCONTINUED] insulin lispro (HUMALOG) 100 UNIT/ML injection INJECT SUBCUTANEOUSLY UP TO 78 UNITS DAILY   No facility-administered encounter medications on file as of 08/25/2018.    ALLERGIES: Allergies  Allergen Reactions  . Penicillins     Childhood allergy    VACCINATION STATUS: Immunization History  Administered Date(s) Administered  . Influenza Split 10/23/2011  . Influenza Whole 09/04/2010  . Influenza,inj,Quad PF,6+ Mos 06/08/2013, 09/03/2014, 07/31/2015, 06/22/2016, 06/29/2018  . Pneumococcal Conjugate-13 03/05/2015  . Pneumococcal Polysaccharide-23 05/14/2011, 11/27/2013     Diabetes  He presents for his follow-up diabetic visit. He has type 2 diabetes mellitus. Onset time: He was diagnosed at approximate age of 33 years. His disease course has been worsening. There are no hypoglycemic associated symptoms. Pertinent negatives for hypoglycemia include no confusion, headaches, pallor or seizures. There are no diabetic associated symptoms. Pertinent negatives for diabetes include no chest pain, no fatigue, no polydipsia, no polyphagia, no polyuria and no weakness. There are no hypoglycemic complications. Symptoms are worsening. There are no diabetic complications. Risk factors for coronary artery disease include diabetes mellitus, dyslipidemia, hypertension, male sex, obesity, sedentary lifestyle and tobacco exposure. Current diabetic treatment includes insulin injections. He is compliant with treatment some of the time. His weight is fluctuating minimally. He is following a generally unhealthy diet. When asked about meal planning, he reported none. He has had a previous visit with a dietitian. He never participates in exercise. His home blood glucose trend is decreasing steadily. His breakfast blood glucose range is generally >200 mg/dl. His lunch blood glucose range is generally 180-200 mg/dl. His dinner blood glucose range is generally 180-200 mg/dl. His bedtime blood glucose range is generally >200 mg/dl. His overall blood glucose range is >200 mg/dl. Eye exam is current.  Hyperlipidemia  This is a chronic problem. The current episode started more than 1 year ago. The problem is uncontrolled. Exacerbating diseases include diabetes and obesity. Pertinent negatives include no chest pain, myalgias or shortness of breath. Current antihyperlipidemic treatment includes statins. Compliance problems include adherence to diet and adherence to exercise.  Risk factors for coronary artery disease include dyslipidemia, diabetes mellitus, obesity, male sex and a sedentary lifestyle.   Hypertension  This is a chronic problem. The current episode started more than 1 year ago. The problem is controlled. Pertinent negatives include no chest pain, headaches, neck pain, palpitations or shortness of breath. Risk factors for coronary artery disease include diabetes mellitus, dyslipidemia, male gender, obesity, smoking/tobacco exposure and sedentary lifestyle. Compliance problems include diet and exercise.      Review of Systems  Constitutional: Negative for chills, fatigue, fever and unexpected weight change.  HENT: Negative for dental problem, mouth sores and trouble swallowing.   Eyes: Negative for visual disturbance.  Respiratory: Negative for cough, choking, chest tightness, shortness of breath and wheezing.   Cardiovascular: Negative for chest pain, palpitations and leg swelling.  Gastrointestinal: Negative for abdominal distention, abdominal pain, constipation, diarrhea, nausea and vomiting.  Endocrine: Negative for polydipsia, polyphagia and polyuria.  Genitourinary: Negative for dysuria, flank pain, hematuria and urgency.  Musculoskeletal: Negative for back pain, gait problem, myalgias and neck pain.  Skin: Negative for pallor, rash and wound.  Neurological: Negative for seizures, syncope, weakness,  numbness and headaches.  Psychiatric/Behavioral: Negative.  Negative for confusion and dysphoric mood.    Objective:    BP (!) 149/75   Pulse 70   Ht 6\' 4"  (1.93 m)   Wt (!) 388 lb (176 kg)   BMI 47.23 kg/m   Wt Readings from Last 3 Encounters:  08/25/18 (!) 388 lb (176 kg)  06/29/18 (!) 391 lb (177.4 kg)  05/19/18 (!) 391 lb (177.4 kg)    Physical Exam  Constitutional: He is oriented to person, place, and time. He appears well-developed. He is cooperative. No distress.  HENT:  Head: Normocephalic and atraumatic.  Eyes:  He wears dark heavy glasses due to reported prior injury to eyes.  Neck: Normal range of motion. Neck supple. No tracheal deviation present.  No thyromegaly present.  Cardiovascular: Normal rate, S1 normal and S2 normal. Exam reveals no gallop.  No murmur heard. Pulses:      Dorsalis pedis pulses are 1+ on the right side, and 1+ on the left side.       Posterior tibial pulses are 1+ on the right side, and 1+ on the left side.  Pulmonary/Chest: Effort normal. No respiratory distress. He has no wheezes.  Abdominal: He exhibits no distension. There is no tenderness. There is no guarding and no CVA tenderness.  Musculoskeletal: He exhibits no edema.       Right shoulder: He exhibits no swelling and no deformity.  Neurological: He is alert and oriented to person, place, and time. He has normal strength. No cranial nerve deficit or sensory deficit. Gait normal.  Skin: Skin is warm and dry. No rash noted. No cyanosis. Nails show no clubbing.  Psychiatric: He has a normal mood and affect. His speech is normal. Judgment normal. Cognition and memory are normal.    Diabetic Labs (most recent): Lab Results  Component Value Date   HGBA1C 9.6 (H) 08/17/2018   HGBA1C 7.4 (H) 04/14/2018   HGBA1C 7.2 (H) 01/11/2018   Recent Results (from the past 2160 hour(s))  Comprehensive metabolic panel     Status: Abnormal   Collection Time: 08/17/18 10:56 AM  Result Value Ref Range   Glucose 147 (H) 65 - 99 mg/dL   BUN 14 8 - 27 mg/dL   Creatinine, Ser 1.56 (H) 0.76 - 1.27 mg/dL   GFR calc non Af Amer 45 (L) >59 mL/min/1.73   GFR calc Af Amer 52 (L) >59 mL/min/1.73   BUN/Creatinine Ratio 9 (L) 10 - 24   Sodium 139 134 - 144 mmol/L   Potassium 4.2 3.5 - 5.2 mmol/L   Chloride 98 96 - 106 mmol/L   CO2 24 20 - 29 mmol/L   Calcium 8.8 8.6 - 10.2 mg/dL   Total Protein 6.4 6.0 - 8.5 g/dL   Albumin 3.8 3.6 - 4.8 g/dL   Globulin, Total 2.6 1.5 - 4.5 g/dL   Albumin/Globulin Ratio 1.5 1.2 - 2.2   Bilirubin Total 1.4 (H) 0.0 - 1.2 mg/dL   Alkaline Phosphatase 139 (H) 39 - 117 IU/L   AST 86 (H) 0 - 40 IU/L   ALT 50 (H) 0 - 44 IU/L  Hgb A1c w/o eAG      Status: Abnormal   Collection Time: 08/17/18 10:56 AM  Result Value Ref Range   Hgb A1c MFr Bld 9.6 (H) 4.8 - 5.6 %    Comment:          Prediabetes: 5.7 - 6.4  Diabetes: >6.4          Glycemic control for adults with diabetes: <7.0   Specimen status report     Status: None   Collection Time: 08/17/18 10:56 AM  Result Value Ref Range   specimen status report Comment     Comment: Isac Caddy CMP14 Default Ambig Abbrev CMP14 Default A hand-written panel/profile was received from your office. In accordance with the [facility] Ambiguous Test Code Policy dated July 3536, we have completed your order by using the closest currently or formerly recognized AMA panel.  We have assigned Comprehensive Metabolic Panel (14), Test Code #322000 to this request.  If this is not the testing you wished to receive on this specimen, please contact the [facility] Client Inquiry/Technical Services Department to clarify the test order.  We appreciate your business.    Lipid Panel     Component Value Date/Time   CHOL 165 07/12/2017 1324   TRIG 100 07/12/2017 1324   HDL 46 07/12/2017 1324   CHOLHDL 4.0 06/26/2016 1403   VLDL 22 06/26/2016 1403   LDLCALC 99 07/12/2017 1324     Assessment & Plan:   1. Uncontrolled type 2 diabetes mellitus with complication, with long-term current use of insulin ([place])  -His diabetes is  complicated by morbid obesity and sleep apnea and patient remains at a high risk for more acute and chronic complications of diabetes which include CAD, CVA, CKD, retinopathy, and neuropathy. These are all discussed in detail with the patient.  He came with loss of control of diabetes with A1c of 9.6% increasing from 7.4%.    -  Glucose logs and insulin administration records pertaining to this visit,  to be scanned into patient's records.  Recent labs reviewed.   - I have re-counseled the patient on diet management and weight loss  by adopting a carbohydrate restricted / protein  rich  Diet.  -  Suggestion is made for him to avoid simple carbohydrates  from his diet including Cakes, Sweet Desserts / Pastries, Ice Cream, Soda (diet and regular), Sweet Tea, Candies, Chips, Cookies, Store Bought Juices, Alcohol in Excess of  1-2 drinks a day, Artificial Sweeteners, and "Sugar-free" Products. This will help patient to have stable blood glucose profile and potentially avoid unintended weight gain.  - Patient is advised to stick to a routine mealtimes to eat 3 meals  a day and avoid unnecessary snacks ( to snack only to correct hypoglycemia).   - I have approached patient with the following individualized plan to manage diabetes and patient agrees.  -  His CGM snapshot analysis shows dropping of his time in range to 35% from 71%, increasing hyperglycemia rate to 64% from 25%.    -No significant documented or reported hypoglycemia.    -I have reapproached him for proper timing of meals and insulin administration.  -He will need higher dose of insulin in order for him to achieve and maintain control of diabetes to target. -He is advised to increase Levemir to 80 units nightly, increase Humalog to 30 units 3 times daily before meals  for pre-meal BG readings of 90-150mg /dl, plus patient specific correction dose of rapid acting insulin for unexpected hyperglycemia above 150mg /dl, associated with strict documentation of blood glucose 4 times a day-before meals and at bedtime.   -Adjustment parameters for hypo and hyperglycemia were given in a written document to patient. -Patient is encouraged to call clinic for blood glucose levels less than 70 or above 300 mg /dl. -He will  continue on Victoza 1.8 mg subcutaneous daily, samples given from clinic. -Due to moderate renal insufficiency, I advised him to stay off of metformin for now.   - Patient specific target  for A1c; LDL, HDL, Triglycerides, and  Waist Circumference were discussed in detail.  2) BP/HTN : His blood pressure is  not controlled to target.  He is advised to continue his current blood pressure medications, will be considered for low-dose lisinopril next visit.   3) Lipids/HPL: His recent lipid panel showed uncontrolled LDL at 99.  He is advised to continue atorvastatin 80 mg nightly.    4)  Weight/Diet:  No significant  success in weight loss, admits to dietary indiscretion. CDE consult in progress, exercise, and carbohydrates information provided.  5) Chronic Care/Health Maintenance:  -Patient is on ACEI/ARB and Statin medications and encouraged to continue to follow up with Ophthalmology, Podiatrist at least yearly or according to recommendations, and advised to  stay away from smoking. I have recommended yearly flu vaccine and pneumonia vaccination at least every 5 years; moderate intensity exercise for up to 150 minutes weekly; and  sleep for at least 7 hours a day.  - I advised patient to maintain close follow up with [name], MD for primary care needs.  - Time spent with the patient: 25 min, of which >50% was spent in reviewing his blood glucose logs , discussing his hypo- and hyper-glycemic episodes, reviewing his current and  previous labs and insulin doses and developing a plan to avoid hypo- and hyper-glycemia. Please refer to Patient Instructions for Blood Glucose Monitoring and Insulin/Medications Dosing Guide"  in media tab for additional information. Kenneth Patrick participated in the discussions, expressed understanding, and voiced agreement with the above plans.  All questions were answered to his satisfaction. he is encouraged to contact clinic should he have any questions or concerns prior to his return visit.  Follow up plan: -Return in about 3 months (around 11/25/2018) for Follow up with Pre-visit Labs, Meter, and Logs.  Glade Lloyd, MD Phone: [phone]  Fax: [phone]   -  This note was partially dictated with voice recognition software. Similar sounding words can be  transcribed inadequately or may not  be corrected upon review.  08/25/2018, 5:19 PM

## 2018-09-02 ENCOUNTER — Other Ambulatory Visit: Payer: Self-pay | Admitting: "Endocrinology

## 2018-09-07 DIAGNOSIS — J449 Chronic obstructive pulmonary disease, unspecified: Secondary | ICD-10-CM | POA: Diagnosis not present

## 2018-09-07 DIAGNOSIS — Z125 Encounter for screening for malignant neoplasm of prostate: Secondary | ICD-10-CM | POA: Diagnosis not present

## 2018-09-07 DIAGNOSIS — R5383 Other fatigue: Secondary | ICD-10-CM | POA: Diagnosis not present

## 2018-09-07 DIAGNOSIS — E1142 Type 2 diabetes mellitus with diabetic polyneuropathy: Secondary | ICD-10-CM | POA: Diagnosis not present

## 2018-09-07 DIAGNOSIS — E1165 Type 2 diabetes mellitus with hyperglycemia: Secondary | ICD-10-CM | POA: Diagnosis not present

## 2018-09-07 DIAGNOSIS — Z79899 Other long term (current) drug therapy: Secondary | ICD-10-CM | POA: Diagnosis not present

## 2018-09-07 DIAGNOSIS — Z0000 Encounter for general adult medical examination without abnormal findings: Secondary | ICD-10-CM | POA: Diagnosis not present

## 2018-09-07 DIAGNOSIS — Z299 Encounter for prophylactic measures, unspecified: Secondary | ICD-10-CM | POA: Diagnosis not present

## 2018-09-07 DIAGNOSIS — Z1211 Encounter for screening for malignant neoplasm of colon: Secondary | ICD-10-CM | POA: Diagnosis not present

## 2018-09-07 DIAGNOSIS — E7800 Pure hypercholesterolemia, unspecified: Secondary | ICD-10-CM | POA: Diagnosis not present

## 2018-09-07 DIAGNOSIS — Z7189 Other specified counseling: Secondary | ICD-10-CM | POA: Diagnosis not present

## 2018-09-07 DIAGNOSIS — Z1339 Encounter for screening examination for other mental health and behavioral disorders: Secondary | ICD-10-CM | POA: Diagnosis not present

## 2018-09-07 DIAGNOSIS — Z1331 Encounter for screening for depression: Secondary | ICD-10-CM | POA: Diagnosis not present

## 2018-09-07 DIAGNOSIS — C4440 Unspecified malignant neoplasm of skin of scalp and neck: Secondary | ICD-10-CM | POA: Diagnosis not present

## 2018-09-13 DIAGNOSIS — C4441 Basal cell carcinoma of skin of scalp and neck: Secondary | ICD-10-CM | POA: Diagnosis not present

## 2018-09-14 ENCOUNTER — Other Ambulatory Visit: Payer: Self-pay | Admitting: "Endocrinology

## 2018-09-14 ENCOUNTER — Other Ambulatory Visit: Payer: Self-pay

## 2018-09-14 DIAGNOSIS — C4441 Basal cell carcinoma of skin of scalp and neck: Secondary | ICD-10-CM | POA: Diagnosis not present

## 2018-09-14 MED ORDER — INSULIN LISPRO 100 UNIT/ML ~~LOC~~ SOLN: 30.0000 [IU] | Freq: Three times a day (TID) | SUBCUTANEOUS | 1 refills | Status: DC

## 2018-09-15 DIAGNOSIS — L989 Disorder of the skin and subcutaneous tissue, unspecified: Secondary | ICD-10-CM | POA: Insufficient documentation

## 2018-10-10 ENCOUNTER — Other Ambulatory Visit: Payer: Self-pay | Admitting: "Endocrinology

## 2018-10-13 DIAGNOSIS — C4441 Basal cell carcinoma of skin of scalp and neck: Secondary | ICD-10-CM | POA: Diagnosis not present

## 2018-10-27 ENCOUNTER — Institutional Professional Consult (permissible substitution): Payer: Medicare Other | Admitting: Plastic Surgery

## 2018-10-27 DIAGNOSIS — C4441 Basal cell carcinoma of skin of scalp and neck: Secondary | ICD-10-CM | POA: Diagnosis not present

## 2018-10-28 ENCOUNTER — Encounter: Payer: Self-pay | Admitting: Plastic Surgery

## 2018-10-28 ENCOUNTER — Ambulatory Visit (INDEPENDENT_AMBULATORY_CARE_PROVIDER_SITE_OTHER): Payer: Medicare Other | Admitting: Plastic Surgery

## 2018-10-28 VITALS — BP 173/75 | HR 92 | Temp 97.7°F | Ht 76.0 in | Wt 385.0 lb

## 2018-10-28 DIAGNOSIS — Z794 Long term (current) use of insulin: Secondary | ICD-10-CM

## 2018-10-28 DIAGNOSIS — G4733 Obstructive sleep apnea (adult) (pediatric): Secondary | ICD-10-CM | POA: Diagnosis not present

## 2018-10-28 DIAGNOSIS — E118 Type 2 diabetes mellitus with unspecified complications: Secondary | ICD-10-CM | POA: Diagnosis not present

## 2018-10-28 DIAGNOSIS — IMO0002 Reserved for concepts with insufficient information to code with codable children: Secondary | ICD-10-CM

## 2018-10-28 DIAGNOSIS — E1165 Type 2 diabetes mellitus with hyperglycemia: Secondary | ICD-10-CM

## 2018-10-28 DIAGNOSIS — M952 Other acquired deformity of head: Secondary | ICD-10-CM

## 2018-10-28 DIAGNOSIS — J449 Chronic obstructive pulmonary disease, unspecified: Secondary | ICD-10-CM | POA: Diagnosis not present

## 2018-10-28 DIAGNOSIS — I2781 Cor pulmonale (chronic): Secondary | ICD-10-CM | POA: Diagnosis not present

## 2018-10-28 DIAGNOSIS — Z9889 Other specified postprocedural states: Secondary | ICD-10-CM | POA: Insufficient documentation

## 2018-10-28 NOTE — H&P (View-Only) (Signed)
Patient ID: Kenneth Patrick, male    DOB: [birth year], 68 y.o.   MRN: [id number]   Chief Complaint  Patient presents with  . Advice Only    post MOHS    The patient is a 68 year old white male here for evaluation of a Mohs defect on his scalp.  He underwent excision of a basal cell carcinoma 1 day ago.  Margins are clear.  He has a complicated medical history with diabetes that is insulin-dependent.  His hemoglobin A1c was 7.7 two months ago.  His sister had BCC also.  The lesion is approximately 10 x 13 cm. He has COPD, obstructive sleep apnea, asthma   Review of Systems  Constitutional: Negative.   HENT: Negative.   Eyes: Positive for visual disturbance.  Respiratory: Negative.   Cardiovascular: Positive for leg swelling.  Gastrointestinal: Negative.  Negative for abdominal distention, abdominal pain and anal bleeding.  Endocrine: Negative.   Genitourinary: Negative.   Musculoskeletal: Negative.   Skin: Positive for wound.    Past Medical History:  Diagnosis Date  . Asthma   . Depression   . Diabetic neuropathy ([place])   . DM2 (diabetes mellitus, type 2) ([place])   . Dyspnea   . Hypercholesterolemia   . Low back pain   . Respiratory infection   . Sleep apnea     Past Surgical History:  Procedure Laterality Date  . CHOLECYSTECTOMY    . left eye enucleation following trauma        Current Outpatient Medications:  .  albuterol (VENTOLIN HFA) 108 (90 BASE) MCG/ACT inhaler, Inhale 2 puffs into the lungs every 6 (six) hours as needed.  , Disp: , Rfl:  .  atorvastatin (LIPITOR) 80 MG tablet, Take 80 mg by mouth At bedtime. , Disp: , Rfl:  .  Continuous Blood Gluc Sensor ([place]) MISC, Use one sensor every 10 days., Disp: 3 each, Rfl: 2 .  Fluticasone-Umeclidin-Vilant (TRELEGY ELLIPTA) 100-62.5-25 MCG/INH AEPB, Inhale 1 puff into the lungs daily., Disp: 60 each, Rfl: 11 .  furosemide (LASIX) 40 MG tablet, Take 40 mg by mouth daily as needed for fluid. ,  Disp: , Rfl:  .  gabapentin (NEURONTIN) 300 MG capsule, Take 300 mg by mouth 2 (two) times daily. , Disp: , Rfl:  .  Insulin Detemir (LEVEMIR FLEXTOUCH) 100 UNIT/ML Pen, Inject 80 Units into the skin at bedtime., Disp: 10 pen, Rfl: 2 .  insulin lispro (HUMALOG) 100 UNIT/ML injection, Inject 0.3-0.36 mLs (30-36 Units total) into the skin 3 (three) times daily with meals., Disp: 90 mL, Rfl: 1 .  liraglutide (VICTOZA) 18 MG/3ML SOPN, INJECT 1.8MG  SUBCUTANEOUSLY DAILY, Disp: 27 mL, Rfl: 0 .  oxymetazoline (AFRIN) 0.05 % nasal spray, Place 1-2 sprays into the nose 2 (two) times daily as needed. , Disp: , Rfl:  .  potassium chloride SA (KLOR-CON M20) 20 MEQ tablet, Take 1 tablet (20 mEq total) by mouth daily., Disp: 90 tablet, Rfl: 1 .  sertraline (ZOLOFT) 100 MG tablet, Take 200 mg by mouth daily. , Disp: , Rfl:  .  ONETOUCH VERIO test strip, TEST AS DIRECTED 4 TIMES  DAILY, Disp: 400 each, Rfl: 2   Objective:   Vitals:   10/28/18 1323  BP: (!) 173/75  Pulse: 92  Temp: 97.7 F (36.5 C)  SpO2: 95%    Physical Exam Vitals signs and nursing note reviewed.  Constitutional:      Appearance: Normal appearance.  HENT:  Head: Normocephalic and atraumatic.      Right Ear: Tympanic membrane normal.  Cardiovascular:     Rate and Rhythm: Normal rate.  Pulmonary:     Effort: Pulmonary effort is normal.  Abdominal:     General: Abdomen is flat. There is no distension.     Tenderness: There is no abdominal tenderness. There is no guarding.  Skin:    General: Skin is warm.  Neurological:     Mental Status: He is alert.     Assessment & Plan:  Uncontrolled type 2 diabetes mellitus with complication, with long-term current use of insulin ([facility])  OSA (obstructive sleep apnea)  Cor pulmonale, chronic ([facility])  Chronic obstructive pulmonary disease, unspecified COPD type ([place])  Mohs defect of scalp  Recommend getting granulation tissue with the Acell and VAC placemen to start.   We  discussed the options after that as well can be tissue rearrangement.  Skin graft is also an option.  The patient wants to talk with his wife about it and will let us know we will move ahead with the ACell and VAC.  We will try to get him in as soon as possible to start this healing process.  In the meantime I stressed the importance of increasing his protein like taking a Glucerna drink daily.  He should be on a multivitamin and vitamin C.  He needs to eliminate carbs and sugars for the healing process.  [place], DO

## 2018-10-28 NOTE — Progress Notes (Signed)
Patient ID: Kenneth Patrick, male    DOB: 03-28-1951, 68 y.o.   MRN: [id number]   Chief Complaint  Patient presents with  . Advice Only    post MOHS    The patient is a 68 year old white male here for evaluation of a Mohs defect on his scalp.  He underwent excision of a basal cell carcinoma 1 day ago.  Margins are clear.  He has a complicated medical history with diabetes that is insulin-dependent.  His hemoglobin A1c was 7.7 two months ago.  His sister had BCC also.  The lesion is approximately 10 x 13 cm. He has COPD, obstructive sleep apnea, asthma   Review of Systems  Constitutional: Negative.   HENT: Negative.   Eyes: Positive for visual disturbance.  Respiratory: Negative.   Cardiovascular: Positive for leg swelling.  Gastrointestinal: Negative.  Negative for abdominal distention, abdominal pain and anal bleeding.  Endocrine: Negative.   Genitourinary: Negative.   Musculoskeletal: Negative.   Skin: Positive for wound.    Past Medical History:  Diagnosis Date  . Asthma   . Depression   . Diabetic neuropathy ([place])   . DM2 (diabetes mellitus, type 2) ([place])   . Dyspnea   . Hypercholesterolemia   . Low back pain   . Respiratory infection   . Sleep apnea     Past Surgical History:  Procedure Laterality Date  . CHOLECYSTECTOMY    . left eye enucleation following trauma        Current Outpatient Medications:  .  albuterol (VENTOLIN HFA) 108 (90 BASE) MCG/ACT inhaler, Inhale 2 puffs into the lungs every 6 (six) hours as needed.  , Disp: , Rfl:  .  atorvastatin (LIPITOR) 80 MG tablet, Take 80 mg by mouth At bedtime. , Disp: , Rfl:  .  Continuous Blood Gluc Sensor ([place]) MISC, Use one sensor every 10 days., Disp: 3 each, Rfl: 2 .  Fluticasone-Umeclidin-Vilant (TRELEGY ELLIPTA) 100-62.5-25 MCG/INH AEPB, Inhale 1 puff into the lungs daily., Disp: 60 each, Rfl: 11 .  furosemide (LASIX) 40 MG tablet, Take 40 mg by mouth daily as needed for fluid. ,  Disp: , Rfl:  .  gabapentin (NEURONTIN) 300 MG capsule, Take 300 mg by mouth 2 (two) times daily. , Disp: , Rfl:  .  Insulin Detemir (LEVEMIR FLEXTOUCH) 100 UNIT/ML Pen, Inject 80 Units into the skin at bedtime., Disp: 10 pen, Rfl: 2 .  insulin lispro (HUMALOG) 100 UNIT/ML injection, Inject 0.3-0.36 mLs (30-36 Units total) into the skin 3 (three) times daily with meals., Disp: 90 mL, Rfl: 1 .  liraglutide (VICTOZA) 18 MG/3ML SOPN, INJECT 1.8MG  SUBCUTANEOUSLY DAILY, Disp: 27 mL, Rfl: 0 .  oxymetazoline (AFRIN) 0.05 % nasal spray, Place 1-2 sprays into the nose 2 (two) times daily as needed. , Disp: , Rfl:  .  potassium chloride SA (KLOR-CON M20) 20 MEQ tablet, Take 1 tablet (20 mEq total) by mouth daily., Disp: 90 tablet, Rfl: 1 .  sertraline (ZOLOFT) 100 MG tablet, Take 200 mg by mouth daily. , Disp: , Rfl:  .  ONETOUCH VERIO test strip, TEST AS DIRECTED 4 TIMES  DAILY, Disp: 400 each, Rfl: 2   Objective:   Vitals:   10/28/18 1323  BP: (!) 173/75  Pulse: 92  Temp: 97.7 F (36.5 C)  SpO2: 95%    Physical Exam Vitals signs and nursing note reviewed.  Constitutional:      Appearance: Normal appearance.  HENT:  Head: Normocephalic and atraumatic.      Right Ear: Tympanic membrane normal.  Cardiovascular:     Rate and Rhythm: Normal rate.  Pulmonary:     Effort: Pulmonary effort is normal.  Abdominal:     General: Abdomen is flat. There is no distension.     Tenderness: There is no abdominal tenderness. There is no guarding.  Skin:    General: Skin is warm.  Neurological:     Mental Status: He is alert.     Assessment & Plan:  Uncontrolled type 2 diabetes mellitus with complication, with long-term current use of insulin (HCC)  OSA (obstructive sleep apnea)  Cor pulmonale, chronic (HCC)  Chronic obstructive pulmonary disease, unspecified COPD type ([place])  Mohs defect of scalp  Recommend getting granulation tissue with the Acell and VAC placemen to start.   We  discussed the options after that as well can be tissue rearrangement.  Skin graft is also an option.  The patient wants to talk with his wife about it and will let us know we will move ahead with the ACell and VAC.  We will try to get him in as soon as possible to start this healing process.  In the meantime I stressed the importance of increasing his protein like taking a Glucerna drink daily.  He should be on a multivitamin and vitamin C.  He needs to eliminate carbs and sugars for the healing process.  [place], DO

## 2018-10-31 ENCOUNTER — Encounter (HOSPITAL_BASED_OUTPATIENT_CLINIC_OR_DEPARTMENT_OTHER): Payer: Self-pay | Admitting: Anesthesiology

## 2018-10-31 ENCOUNTER — Ambulatory Visit (HOSPITAL_BASED_OUTPATIENT_CLINIC_OR_DEPARTMENT_OTHER)
Admission: RE | Admit: 2018-10-31 | Discharge: 2018-10-31 | Disposition: A | Discharge status: Home / Self Care | Payer: Medicare Other | Attending: Plastic Surgery | Admitting: Plastic Surgery

## 2018-10-31 DIAGNOSIS — Z428 Encounter for other plastic and reconstructive surgery following medical procedure or healed injury: Secondary | ICD-10-CM | POA: Diagnosis not present

## 2018-10-31 DIAGNOSIS — Z5309 Procedure and treatment not carried out because of other contraindication: Secondary | ICD-10-CM | POA: Insufficient documentation

## 2018-10-31 DIAGNOSIS — C4441 Basal cell carcinoma of skin of scalp and neck: Secondary | ICD-10-CM | POA: Insufficient documentation

## 2018-10-31 MED ORDER — ONDANSETRON HCL 4 MG/2ML IJ SOLN
INTRAMUSCULAR | Status: AC
  Filled 2018-10-31: qty 2

## 2018-10-31 MED ORDER — DEXAMETHASONE SODIUM PHOSPHATE 10 MG/ML IJ SOLN
INTRAMUSCULAR | Status: AC
  Filled 2018-10-31: qty 1

## 2018-10-31 MED ORDER — LIDOCAINE 2% (20 MG/ML) 5 ML SYRINGE
INTRAMUSCULAR | Status: AC
  Filled 2018-10-31: qty 5

## 2018-10-31 MED ORDER — MIDAZOLAM HCL 2 MG/2ML IJ SOLN: 1.0000 mg | INTRAMUSCULAR | Status: DC | PRN

## 2018-10-31 MED ORDER — FENTANYL CITRATE (PF) 100 MCG/2ML IJ SOLN: 50.0000 ug | INTRAMUSCULAR | Status: DC | PRN

## 2018-10-31 MED ORDER — PROPOFOL 10 MG/ML IV BOLUS
INTRAVENOUS | Status: AC
  Filled 2018-10-31: qty 20

## 2018-10-31 MED ORDER — LACTATED RINGERS IV SOLN: INTRAVENOUS | Status: DC

## 2018-10-31 MED ORDER — CIPROFLOXACIN IN D5W 400 MG/200ML IV SOLN: 400.0000 mg | INTRAVENOUS | Status: DC

## 2018-10-31 MED ORDER — SCOPOLAMINE 1 MG/3DAYS TD PT72: 1.0000 | MEDICATED_PATCH | Freq: Once | TRANSDERMAL | Status: DC | PRN

## 2018-10-31 MED ORDER — FENTANYL CITRATE (PF) 100 MCG/2ML IJ SOLN
INTRAMUSCULAR | Status: AC
  Filled 2018-10-31: qty 2

## 2018-10-31 MED ORDER — MIDAZOLAM HCL 2 MG/2ML IJ SOLN
INTRAMUSCULAR | Status: AC
  Filled 2018-10-31: qty 2

## 2018-10-31 NOTE — Progress Notes (Signed)
Chart reviewed with Dr Lanetta Inch. Obtain EKG and blood glucose DOS and proceed as scheduled at [facility].

## 2018-10-31 NOTE — Progress Notes (Signed)
Surgery cancelled d/t to NPO status per Dr. Levy Sjogren.  Will be rescheduled for 1/28 at 0730.  Patient and wife verbalize understanding of nothing to eat or drink past midnight, do not take any diabetic meds in the morning and arrive at 0600.

## 2018-11-01 ENCOUNTER — Encounter (HOSPITAL_BASED_OUTPATIENT_CLINIC_OR_DEPARTMENT_OTHER)
Admission: RE | Disposition: A | Discharge status: Home / Self Care | Payer: Self-pay | Source: Ambulatory Visit | Attending: Plastic Surgery

## 2018-11-01 ENCOUNTER — Other Ambulatory Visit: Payer: Self-pay

## 2018-11-01 ENCOUNTER — Encounter (HOSPITAL_BASED_OUTPATIENT_CLINIC_OR_DEPARTMENT_OTHER): Payer: Self-pay

## 2018-11-01 ENCOUNTER — Ambulatory Visit (HOSPITAL_BASED_OUTPATIENT_CLINIC_OR_DEPARTMENT_OTHER): Payer: Medicare Other | Admitting: Anesthesiology

## 2018-11-01 ENCOUNTER — Ambulatory Visit (HOSPITAL_BASED_OUTPATIENT_CLINIC_OR_DEPARTMENT_OTHER)
Admission: RE | Admit: 2018-11-01 | Discharge: 2018-11-01 | Disposition: A | Discharge status: Home / Self Care | Payer: Medicare Other | Source: Ambulatory Visit | Attending: Plastic Surgery | Admitting: Plastic Surgery

## 2018-11-01 DIAGNOSIS — G4733 Obstructive sleep apnea (adult) (pediatric): Secondary | ICD-10-CM | POA: Insufficient documentation

## 2018-11-01 DIAGNOSIS — Z794 Long term (current) use of insulin: Secondary | ICD-10-CM | POA: Insufficient documentation

## 2018-11-01 DIAGNOSIS — E7800 Pure hypercholesterolemia, unspecified: Secondary | ICD-10-CM | POA: Insufficient documentation

## 2018-11-01 DIAGNOSIS — I10 Essential (primary) hypertension: Secondary | ICD-10-CM | POA: Diagnosis not present

## 2018-11-01 DIAGNOSIS — Z808 Family history of malignant neoplasm of other organs or systems: Secondary | ICD-10-CM | POA: Diagnosis not present

## 2018-11-01 DIAGNOSIS — C4441 Basal cell carcinoma of skin of scalp and neck: Secondary | ICD-10-CM | POA: Diagnosis not present

## 2018-11-01 DIAGNOSIS — Z6841 Body Mass Index (BMI) 40.0 and over, adult: Secondary | ICD-10-CM | POA: Diagnosis not present

## 2018-11-01 DIAGNOSIS — M952 Other acquired deformity of head: Secondary | ICD-10-CM

## 2018-11-01 DIAGNOSIS — Z85828 Personal history of other malignant neoplasm of skin: Secondary | ICD-10-CM

## 2018-11-01 DIAGNOSIS — Z79899 Other long term (current) drug therapy: Secondary | ICD-10-CM | POA: Insufficient documentation

## 2018-11-01 DIAGNOSIS — E1140 Type 2 diabetes mellitus with diabetic neuropathy, unspecified: Secondary | ICD-10-CM | POA: Diagnosis not present

## 2018-11-01 DIAGNOSIS — I2781 Cor pulmonale (chronic): Secondary | ICD-10-CM | POA: Diagnosis not present

## 2018-11-01 DIAGNOSIS — J449 Chronic obstructive pulmonary disease, unspecified: Secondary | ICD-10-CM | POA: Diagnosis not present

## 2018-11-01 DIAGNOSIS — Z7951 Long term (current) use of inhaled steroids: Secondary | ICD-10-CM | POA: Insufficient documentation

## 2018-11-01 DIAGNOSIS — Z428 Encounter for other plastic and reconstructive surgery following medical procedure or healed injury: Secondary | ICD-10-CM | POA: Diagnosis not present

## 2018-11-01 DIAGNOSIS — F329 Major depressive disorder, single episode, unspecified: Secondary | ICD-10-CM | POA: Diagnosis not present

## 2018-11-01 HISTORY — PX: APPLICATION OF A-CELL OF HEAD/NECK: SHX6304

## 2018-11-01 HISTORY — PX: DEBRIDEMENT AND CLOSURE WOUND: SHX5614

## 2018-11-01 HISTORY — DX: Chronic obstructive pulmonary disease, unspecified: J44.9

## 2018-11-01 LAB — GLUCOSE, CAPILLARY
Glucose-Capillary: 222 mg/dL — ABNORMAL HIGH (ref 70–99)
Glucose-Capillary: 255 mg/dL — ABNORMAL HIGH (ref 70–99)

## 2018-11-01 SURGERY — DEBRIDEMENT, WOUND, WITH CLOSURE
Anesthesia: General | Site: Scalp | Laterality: Left

## 2018-11-01 SURGERY — DEBRIDEMENT, WOUND, WITH CLOSURE
Anesthesia: General

## 2018-11-01 MED ORDER — ONDANSETRON HCL 4 MG/2ML IJ SOLN: 4.0000 mg | Freq: Once | INTRAMUSCULAR | Status: DC | PRN

## 2018-11-01 MED ORDER — LACTATED RINGERS IV SOLN
INTRAVENOUS | Status: DC
  Administered 2018-11-01: 07:00:00 via INTRAVENOUS

## 2018-11-01 MED ORDER — PROPOFOL 10 MG/ML IV BOLUS
INTRAVENOUS | Status: AC
  Filled 2018-11-01: qty 40

## 2018-11-01 MED ORDER — ALBUTEROL SULFATE HFA 108 (90 BASE) MCG/ACT IN AERS
INHALATION_SPRAY | RESPIRATORY_TRACT | Status: AC
  Filled 2018-11-01: qty 6.7

## 2018-11-01 MED ORDER — ACETAMINOPHEN 325 MG PO TABS: 650.0000 mg | ORAL_TABLET | ORAL | Status: DC | PRN

## 2018-11-01 MED ORDER — ALBUTEROL SULFATE HFA 108 (90 BASE) MCG/ACT IN AERS
INHALATION_SPRAY | RESPIRATORY_TRACT | Status: DC | PRN
  Administered 2018-11-01: 4 via RESPIRATORY_TRACT

## 2018-11-01 MED ORDER — FENTANYL CITRATE (PF) 100 MCG/2ML IJ SOLN
50.0000 ug | INTRAMUSCULAR | Status: DC | PRN
  Administered 2018-11-01: 50 ug via INTRAVENOUS

## 2018-11-01 MED ORDER — ROCURONIUM BROMIDE 100 MG/10ML IV SOLN
INTRAVENOUS | Status: DC | PRN
  Administered 2018-11-01: 30 mg via INTRAVENOUS

## 2018-11-01 MED ORDER — HYDROCODONE-ACETAMINOPHEN 5-325 MG PO TABS: 1.0000 | ORAL_TABLET | Freq: Four times a day (QID) | ORAL | 0 refills | Status: AC | PRN

## 2018-11-01 MED ORDER — OXYCODONE HCL 5 MG PO TABS: 5.0000 mg | ORAL_TABLET | ORAL | Status: DC | PRN

## 2018-11-01 MED ORDER — BACITRACIN ZINC 500 UNIT/GM EX OINT
TOPICAL_OINTMENT | CUTANEOUS | Status: AC
  Filled 2018-11-01: qty 28.35

## 2018-11-01 MED ORDER — FENTANYL CITRATE (PF) 100 MCG/2ML IJ SOLN
INTRAMUSCULAR | Status: AC
  Filled 2018-11-01: qty 2

## 2018-11-01 MED ORDER — LIDOCAINE-EPINEPHRINE 1 %-1:100000 IJ SOLN
INTRAMUSCULAR | Status: DC | PRN
  Administered 2018-11-01: 10 mL

## 2018-11-01 MED ORDER — BUPIVACAINE-EPINEPHRINE (PF) 0.5% -1:200000 IJ SOLN
INTRAMUSCULAR | Status: AC
  Filled 2018-11-01: qty 30

## 2018-11-01 MED ORDER — PROPOFOL 10 MG/ML IV BOLUS
INTRAVENOUS | Status: DC | PRN
  Administered 2018-11-01: 200 mg via INTRAVENOUS

## 2018-11-01 MED ORDER — SODIUM CHLORIDE 0.9% FLUSH: 3.0000 mL | INTRAVENOUS | Status: DC | PRN

## 2018-11-01 MED ORDER — BACITRACIN-NEOMYCIN-POLYMYXIN 400-5-5000 EX OINT
TOPICAL_OINTMENT | CUTANEOUS | Status: AC
  Filled 2018-11-01: qty 1

## 2018-11-01 MED ORDER — ONDANSETRON HCL 4 MG/2ML IJ SOLN
INTRAMUSCULAR | Status: AC
  Filled 2018-11-01: qty 2

## 2018-11-01 MED ORDER — OXYCODONE HCL 5 MG PO TABS: 5.0000 mg | ORAL_TABLET | Freq: Once | ORAL | Status: DC | PRN

## 2018-11-01 MED ORDER — SCOPOLAMINE 1 MG/3DAYS TD PT72: 1.0000 | MEDICATED_PATCH | Freq: Once | TRANSDERMAL | Status: DC | PRN

## 2018-11-01 MED ORDER — SODIUM CHLORIDE 0.9% FLUSH: 3.0000 mL | Freq: Two times a day (BID) | INTRAVENOUS | Status: DC

## 2018-11-01 MED ORDER — ACETAMINOPHEN 650 MG RE SUPP: 650.0000 mg | RECTAL | Status: DC | PRN

## 2018-11-01 MED ORDER — SODIUM CHLORIDE 0.9 % IV SOLN
INTRAVENOUS | Status: DC | PRN
  Administered 2018-11-01: 500 mL

## 2018-11-01 MED ORDER — LIDOCAINE 2% (20 MG/ML) 5 ML SYRINGE
INTRAMUSCULAR | Status: AC
  Filled 2018-11-01: qty 5

## 2018-11-01 MED ORDER — SUCCINYLCHOLINE CHLORIDE 20 MG/ML IJ SOLN
INTRAMUSCULAR | Status: DC | PRN
  Administered 2018-11-01: 200 mg via INTRAVENOUS

## 2018-11-01 MED ORDER — SUGAMMADEX SODIUM 500 MG/5ML IV SOLN
INTRAVENOUS | Status: DC | PRN
  Administered 2018-11-01: 500 mg via INTRAVENOUS

## 2018-11-01 MED ORDER — OXYCODONE HCL 5 MG/5ML PO SOLN: 5.0000 mg | Freq: Once | ORAL | Status: DC | PRN

## 2018-11-01 MED ORDER — LIDOCAINE-EPINEPHRINE 1 %-1:100000 IJ SOLN
INTRAMUSCULAR | Status: AC
  Filled 2018-11-01: qty 1

## 2018-11-01 MED ORDER — MIDAZOLAM HCL 2 MG/2ML IJ SOLN: 1.0000 mg | INTRAMUSCULAR | Status: DC | PRN

## 2018-11-01 MED ORDER — ONDANSETRON HCL 4 MG/2ML IJ SOLN
INTRAMUSCULAR | Status: DC | PRN
  Administered 2018-11-01: 4 mg via INTRAVENOUS

## 2018-11-01 MED ORDER — LIDOCAINE HCL (CARDIAC) PF 100 MG/5ML IV SOSY
PREFILLED_SYRINGE | INTRAVENOUS | Status: DC | PRN
  Administered 2018-11-01: 100 mg via INTRAVENOUS

## 2018-11-01 MED ORDER — FENTANYL CITRATE (PF) 100 MCG/2ML IJ SOLN: 25.0000 ug | INTRAMUSCULAR | Status: DC | PRN

## 2018-11-01 MED ORDER — SODIUM CHLORIDE 0.9 % IV SOLN: 250.0000 mL | INTRAVENOUS | Status: DC | PRN

## 2018-11-01 MED ORDER — PHENYLEPHRINE HCL 10 MG/ML IJ SOLN
INTRAMUSCULAR | Status: DC | PRN
  Administered 2018-11-01 (×2): 80 ug via INTRAVENOUS
  Administered 2018-11-01: 200 ug via INTRAVENOUS

## 2018-11-01 SURGICAL SUPPLY — 76 items
BINDER BREAST 3XL (GAUZE/BANDAGES/DRESSINGS) IMPLANT
BINDER BREAST LRG (GAUZE/BANDAGES/DRESSINGS) IMPLANT
BINDER BREAST MEDIUM (GAUZE/BANDAGES/DRESSINGS) IMPLANT
BINDER BREAST XLRG (GAUZE/BANDAGES/DRESSINGS) IMPLANT
BINDER BREAST XXLRG (GAUZE/BANDAGES/DRESSINGS) IMPLANT
BLADE CLIPPER SURG (BLADE) IMPLANT
BLADE HEX COATED 2.75 (ELECTRODE) IMPLANT
BLADE SURG 10 STRL SS (BLADE) IMPLANT
BLADE SURG 15 STRL LF DISP TIS (BLADE) ×2 IMPLANT
BLADE SURG 15 STRL SS (BLADE) ×2
BNDG GAUZE ELAST 4 BULKY (GAUZE/BANDAGES/DRESSINGS) IMPLANT
CHLORAPREP W/TINT 26ML (MISCELLANEOUS) IMPLANT
COVER BACK TABLE 60X90IN (DRAPES) ×4 IMPLANT
COVER MAYO STAND STRL (DRAPES) ×4 IMPLANT
COVER WAND RF STERILE (DRAPES) IMPLANT
DECANTER SPIKE VIAL GLASS SM (MISCELLANEOUS) IMPLANT
DERMABOND ADVANCED (GAUZE/BANDAGES/DRESSINGS)
DERMABOND ADVANCED .7 DNX12 (GAUZE/BANDAGES/DRESSINGS) IMPLANT
DRAIN CHANNEL 19F RND (DRAIN) IMPLANT
DRAPE INCISE IOBAN 66X45 STRL (DRAPES) IMPLANT
DRAPE LAPAROSCOPIC ABDOMINAL (DRAPES) IMPLANT
DRAPE LAPAROTOMY 100X72 PEDS (DRAPES) IMPLANT
DRAPE SURG 17X23 STRL (DRAPES) IMPLANT
DRAPE U-SHAPE 76X120 STRL (DRAPES) ×4 IMPLANT
DRESSING DUODERM 4X4 STERILE (GAUZE/BANDAGES/DRESSINGS) IMPLANT
DRSG ADAPTIC 3X8 NADH LF (GAUZE/BANDAGES/DRESSINGS) IMPLANT
DRSG CUTIMED SORBACT 7X9 (GAUZE/BANDAGES/DRESSINGS) ×4 IMPLANT
DRSG EMULSION OIL 3X3 NADH (GAUZE/BANDAGES/DRESSINGS) IMPLANT
DRSG PAD ABDOMINAL 8X10 ST (GAUZE/BANDAGES/DRESSINGS) IMPLANT
DRSG TEGADERM 4X10 (GAUZE/BANDAGES/DRESSINGS) IMPLANT
DRSG TELFA 3X8 NADH (GAUZE/BANDAGES/DRESSINGS) IMPLANT
ELECT REM PT RETURN 9FT ADLT (ELECTROSURGICAL) ×4
ELECTRODE REM PT RTRN 9FT ADLT (ELECTROSURGICAL) ×2 IMPLANT
EVACUATOR SILICONE 100CC (DRAIN) IMPLANT
GAUZE SPONGE 4X4 12PLY STRL (GAUZE/BANDAGES/DRESSINGS) IMPLANT
GAUZE XEROFORM 5X9 LF (GAUZE/BANDAGES/DRESSINGS) IMPLANT
GLOVE BIO SURGEON STRL SZ 6.5 (GLOVE) ×3 IMPLANT
GLOVE BIO SURGEON STRL SZ7 (GLOVE) ×8 IMPLANT
GLOVE BIO SURGEONS STRL SZ 6.5 (GLOVE) ×1
GLOVE BIOGEL PI IND STRL 7.0 (GLOVE) ×2 IMPLANT
GLOVE BIOGEL PI IND STRL 7.5 (GLOVE) ×2 IMPLANT
GLOVE BIOGEL PI INDICATOR 7.0 (GLOVE) ×2
GLOVE BIOGEL PI INDICATOR 7.5 (GLOVE) ×2
GOWN STRL REUS W/ TWL LRG LVL3 (GOWN DISPOSABLE) ×4 IMPLANT
GOWN STRL REUS W/TWL LRG LVL3 (GOWN DISPOSABLE) ×4
MATRIX WOUND 3-LAYER 10X15 (Tissue) ×3 IMPLANT
MICROMATRIX 1000MG (Tissue) ×4 IMPLANT
MICROMATRIX 500MG (Tissue) ×4 IMPLANT
NEEDLE HYPO 25X1 1.5 SAFETY (NEEDLE) ×4 IMPLANT
NS IRRIG 1000ML POUR BTL (IV SOLUTION) ×4 IMPLANT
PACK BASIN DAY SURGERY FS (CUSTOM PROCEDURE TRAY) ×4 IMPLANT
PENCIL BUTTON HOLSTER BLD 10FT (ELECTRODE) ×4 IMPLANT
SHEET MEDIUM DRAPE 40X70 STRL (DRAPES) IMPLANT
SLEEVE SCD COMPRESS KNEE MED (MISCELLANEOUS) IMPLANT
SLEEVE SCD COMPRESS KNEE XLRG (MISCELLANEOUS) ×4 IMPLANT
SOLUTION PARTIC MCRMTRX 1000MG (Tissue) ×2 IMPLANT
SOLUTION PARTIC MCRMTRX 500MG (Tissue) ×2 IMPLANT
SPONGE LAP 18X18 RF (DISPOSABLE) ×4 IMPLANT
STAPLER VISISTAT 35W (STAPLE) IMPLANT
SUT MNCRL AB 3-0 PS2 18 (SUTURE) IMPLANT
SUT MNCRL AB 4-0 PS2 18 (SUTURE) IMPLANT
SUT MON AB 5-0 PS2 18 (SUTURE) ×4 IMPLANT
SUT SILK 3 0 PS 1 (SUTURE) IMPLANT
SUT SILK 4 0 PS 2 (SUTURE) ×4 IMPLANT
SUT VIC AB 5-0 PS2 18 (SUTURE) ×12 IMPLANT
SWAB COLLECTION DEVICE MRSA (MISCELLANEOUS) IMPLANT
SWAB CULTURE ESWAB REG 1ML (MISCELLANEOUS) IMPLANT
SYR BULB IRRIGATION 50ML (SYRINGE) ×4 IMPLANT
SYR CONTROL 10ML LL (SYRINGE) ×4 IMPLANT
TOWEL GREEN STERILE FF (TOWEL DISPOSABLE) ×8 IMPLANT
TRAY DSU PREP LF (CUSTOM PROCEDURE TRAY) ×4 IMPLANT
TUBE CONNECTING 20'X1/4 (TUBING) ×1
TUBE CONNECTING 20X1/4 (TUBING) ×3 IMPLANT
UNDERPAD 30X30 (UNDERPADS AND DIAPERS) ×4 IMPLANT
WOUND MATRIX 3-LAYER 10X15 (Tissue) ×1 IMPLANT
YANKAUER SUCT BULB TIP NO VENT (SUCTIONS) ×4 IMPLANT

## 2018-11-01 NOTE — Discharge Instructions (Addendum)
INSTRUCTIONS FOR AFTER SURGERY ° ° °You are having surgery.  You will likely have some questions about what to expect following your operation.  The following information will help you and your family understand what to expect when you are discharged from the hospital.  Following these guidelines will help ensure a smooth recovery and reduce risks of complications.   °Postoperative instructions include information on: diet, wound care, medications and physical activity. ° °AFTER SURGERY °Expect to go home after the procedure.  In some cases, you may need to spend one night in the hospital for observation. ° °DIET °This surgery does not require a specific diet.  However, I have to mention that the healthier you eat the better your body can start healing. It is important to increasing your protein intake.  This means limiting the foods with sugar and carbohydrates.  Focus on vegetables and some meat.  If you have any liposuction during your procedure be sure to drink water.  If your urine is bright yellow, then it is concentrated, and you need to drink more water.  As a general rule after surgery, you should have 8 ounces of water every hour while awake.  If you find you are persistently nauseated or unable to take in liquids let us know.  NO TOBACCO USE or EXPOSURE.  This will slow your healing process and increase the risk of a wound. ° °WOUND CARE °You can shower the day after surgery if you don't have a drain.  Use fragrance free soap.  Dial, Dove and Ivory are usually mild on the skin. If you have a drain clean with baby wipes until the drain is removed.  If you have steri-strips / tape directly attached to your skin leave them in place. It is OK to get these wet.  No baths, pools or hot tubs for two weeks. °We close your incision to leave the smallest and best-looking scar. No ointment or creams on your incisions until given the go ahead.  Especially not Neosporin (Too many skin reactions with this one).  A few  weeks after surgery you can use Mederma and start massaging the scar. °We ask you to wear your binder or sports bra for the first 6 weeks around the clock, including while sleeping. This provides added comfort and helps reduce the fluid accumulation at the surgery site. ° °ACTIVITY °No heavy lifting until cleared by the doctor.  It is OK to walk and climb stairs. In fact, moving your legs is very important to decrease your risk of a blood clot.  It will also help keep you from getting deconditioned.  Every 1 to 2 hours get up and walk for 5 minutes. This will help with a quicker recovery back to normal.  Let pain be your guide so you don't do too much.  NO, you cannot do the spring cleaning and don't plan on taking care of anyone else.  This is your time for TLC.  °You will be more comfortable if you sleep and rest with your head elevated either with a few pillows under you or in a recliner.  No stomach sleeping for a few months. ° °WORK °Everyone returns to work at different times. As a rough guide, most people take at least 1 - 2 weeks off prior to returning to work. If you need documentation for your job, bring the forms to your postoperative follow up visit. ° °DRIVING °Arrange for someone to bring you home from the hospital.  You   may be able to drive a few days after surgery but not while taking any narcotics or valium.  BOWEL MOVEMENTS Constipation can occur after anesthesia and while taking pain medication.  It is important to stay ahead for your comfort.  We recommend taking Milk of Magnesia (2 tablespoons; twice a day) while taking the pain pills.  SEROMA This is fluid your body tried to put in the surgical site.  This is normal but if it creates tight skinny skin let us know.  It usually decreases in a few weeks.  WHEN TO CALL Call your surgeon's office if any of the following occur:  Fever 101 degrees F or greater  Excessive bleeding or fluid from the incision site.  Pain that increases  over time without aid from the medications  Redness, warmth, or pus draining from incision sites  Persistent nausea or inability to take in liquids  Severe misshapen area that underwent the operation.   Apply KY gel to the scalp twice a day !!!! Do not get the scalp wet.   Post Anesthesia Home Care Instructions  Activity: Get plenty of rest for the remainder of the day. A responsible individual must stay with you for 24 hours following the procedure.  For the next 24 hours, DO NOT: -Drive a car -Paediatric nurse -Drink alcoholic beverages -Take any medication unless instructed by your physician -Make any legal decisions or sign important papers.  Meals: Start with liquid foods such as gelatin or soup. Progress to regular foods as tolerated. Avoid greasy, spicy, heavy foods. If nausea and/or vomiting occur, drink only clear liquids until the nausea and/or vomiting subsides. Call your physician if vomiting continues.  Special Instructions/Symptoms: Your throat may feel dry or sore from the anesthesia or the breathing tube placed in your throat during surgery. If this causes discomfort, gargle with warm salt water. The discomfort should disappear within 24 hours.  If you had a scopolamine patch placed behind your ear for the management of post- operative nausea and/or vomiting:  1. The medication in the patch is effective for 72 hours, after which it should be removed.  Wrap patch in a tissue and discard in the trash. Wash hands thoroughly with soap and water. 2. You may remove the patch earlier than 72 hours if you experience unpleasant side effects which may include dry mouth, dizziness or visual disturbances. 3. Avoid touching the patch. Wash your hands with soap and water after contact with the patch.

## 2018-11-01 NOTE — Anesthesia Procedure Notes (Signed)
Procedure Name: Intubation Performed by: Verita Lamb, CRNA Pre-anesthesia Checklist: Patient identified, Emergency Drugs available, Suction available, Patient being monitored and Timeout performed Patient Re-evaluated:Patient Re-evaluated prior to induction Oxygen Delivery Method: Circle system utilized Preoxygenation: Pre-oxygenation with 100% oxygen Induction Type: IV induction and Rapid sequence Laryngoscope Size: Mac and 4 Grade View: Grade II Tube type: Oral Tube size: 8.0 mm Number of attempts: 1 Airway Equipment and Method: Stylet Placement Confirmation: ETT inserted through vocal cords under direct vision,  positive ETCO2,  CO2 detector and breath sounds checked- equal and bilateral Secured at: 24 cm Tube secured with: Tape Dental Injury: Teeth and Oropharynx as per pre-operative assessment  Difficulty Due To: Difficulty was anticipated Comments: Pt preoxygenated with o2 via facemask, beard so difficult to maintain seal.  Obtained an etoxygen of 72 percent, rsi induction without ventilation, grade I view with mac 4 blade, bbs, etco2+.  Prior to induction placed pt in reverse tberg position with hob back one notch to facilitate sniffing position, glidescope in the room. mkelly

## 2018-11-01 NOTE — Anesthesia Postprocedure Evaluation (Signed)
Anesthesia Post Note  Patient: Kenneth Patrick  Procedure(s) Performed: Debridement scalp wound (Left Scalp) APPLICATION OF A-CELL TO SCALP WOUND (Left Head)     Patient location during evaluation: PACU Anesthesia Type: General Level of consciousness: awake and alert Pain management: pain level controlled Vital Signs Assessment: post-procedure vital signs reviewed and stable Respiratory status: spontaneous breathing, nonlabored ventilation and respiratory function stable Cardiovascular status: blood pressure returned to baseline and stable Postop Assessment: no apparent nausea or vomiting Anesthetic complications: no    Last Vitals:  Vitals:   11/01/18 0845 11/01/18 0900  BP: (!) 145/62 (!) 131/55  Pulse: 81 78  Resp: 15 15  Temp:    SpO2: 98% 91%    Last Pain:  Vitals:   11/01/18 0900  PainSc: 0-No pain                 Lidia Collum

## 2018-11-01 NOTE — Op Note (Signed)
DATE OF OPERATION: 11/01/2018  LOCATION: [facility]  PREOPERATIVE DIAGNOSIS:  Mohs defect of scalp  POSTOPERATIVE DIAGNOSIS: Same  PROCEDURE: Preparation of scalp defect 9 x 9 cm for placement of Acell (10 x 15 cm and 1.5 gm)  SURGEON: Marzell Allemand Sanger Luara Faye, DO  EBL: 1 cc  CONDITION: Stable  COMPLICATIONS: None  INDICATION: The patient, Kenneth Patrick, is a 68 y.o. male born on [birth year], is here for treatment of a mohs defect of the scalp after cancer resection.   PROCEDURE DETAILS:  The patient was seen prior to surgery and marked.  The IV antibiotics were given. The patient was taken to the operating room and given a general anesthetic. A standard time out was performed and all information was confirmed by those in the room. SCDs were placed.   The scalp was prepped and draped.  Local was placed for intraoperative hemostasis and post operative pain control.  The base of the wound was debrided with the curette. There was good bleeding, temporalis fascia and muscle.  There was a 1 x 2 cm area of bone exposed at the posterior aspect.  The wound was 9 x 9 cm in size.  All of the Acell powder was applied.  The 9 x 9 cm Acell sheet was applied.  The sheet was sutured in place with the 5-0 Vicryl.  The sorbact was applied and secured with the Vicryl.  Ky gel and gauze was applied and taped into place to secure the dressing. The patient was allowed to wake up and taken to recovery room in stable condition at the end of the case. The family was notified at the end of the case.

## 2018-11-01 NOTE — Interval H&P Note (Signed)
History and Physical Interval Note:  11/01/2018 6:50 AM  Kenneth Patrick  has presented today for surgery, with the diagnosis of Mohs defect of scalp  The various methods of treatment have been discussed with the patient and family. After consideration of risks, benefits and other options for treatment, the patient has consented to  Procedure(s): Debridement and placement of ACell scalp wound and VAC (N/A) as a surgical intervention .  The patient's history has been reviewed, patient examined, no change in status, stable for surgery.  I have reviewed the patient's chart and labs.  Questions were answered to the patient's satisfaction.     Loel Lofty Antionetta Ator

## 2018-11-01 NOTE — Anesthesia Preprocedure Evaluation (Addendum)
Anesthesia Evaluation  Patient identified by MRN, date of birth, ID band Patient awake    Reviewed: Allergy & Precautions, NPO status , Patient's Chart, lab work & pertinent test results  History of Anesthesia Complications Negative for: history of anesthetic complications  Airway Mallampati: II  TM Distance: >3 FB Neck ROM: Full    Dental no notable dental hx.    Pulmonary sleep apnea and Continuous Positive Airway Pressure Ventilation , COPD (required oxygen temporarily after having pneumonia, has been off for years now), former smoker,    breath sounds clear to auscultation + decreased breath sounds(-) wheezing      Cardiovascular hypertension, Normal cardiovascular exam Rhythm:Regular Rate:Normal     Neuro/Psych PSYCHIATRIC DISORDERS Depression negative neurological ROS     GI/Hepatic negative GI ROS, Neg liver ROS,   Endo/Other  diabetes, Type 2, Insulin DependentMorbid obesity  Renal/GU negative Renal ROS  negative genitourinary   Musculoskeletal negative musculoskeletal ROS (+)   Abdominal (+) + obese,   Peds  Hematology negative hematology ROS (+)   Anesthesia Other Findings   Reproductive/Obstetrics                            Anesthesia Physical Anesthesia Plan  ASA: III  Anesthesia Plan: General   Post-op Pain Management:    Induction: Intravenous  PONV Risk Score and Plan: 2 and Ondansetron, Dexamethasone and Treatment may vary due to age or medical condition  Airway Management Planned: LMA and Oral ETT  Additional Equipment: None  Intra-op Plan:   Post-operative Plan: Extubation in OR  Informed Consent: I have reviewed the patients History and Physical, chart, labs and discussed the procedure including the risks, benefits and alternatives for the proposed anesthesia with the patient or authorized representative who has indicated his/her understanding and acceptance.      Dental advisory given  Plan Discussed with:   Anesthesia Plan Comments:       Anesthesia Quick Evaluation

## 2018-11-01 NOTE — Transfer of Care (Signed)
Immediate Anesthesia Transfer of Care Note  Patient: Kenneth Patrick  Procedure(s) Performed: Debridement scalp wound (Left Scalp) APPLICATION OF A-CELL TO SCALP WOUND (Left Head)  Patient Location: PACU  Anesthesia Type:General  Level of Consciousness: awake, alert  and oriented  Airway & Oxygen Therapy: Patient Spontanous Breathing and Patient connected to face mask oxygen  Post-op Assessment: Report given to RN and Post -op Vital signs reviewed and stable  Post vital signs: Reviewed and stable  Last Vitals:  Vitals Value Taken Time  BP 121/59 11/01/2018  8:34 AM  Temp    Pulse 81 11/01/2018  8:40 AM  Resp 17 11/01/2018  8:40 AM  SpO2 98 % 11/01/2018  8:40 AM  Vitals shown include unvalidated device data.  Last Pain:  Vitals:   11/01/18 0644  PainSc: 0-No pain         Complications: No apparent anesthesia complications

## 2018-11-03 ENCOUNTER — Encounter (HOSPITAL_BASED_OUTPATIENT_CLINIC_OR_DEPARTMENT_OTHER): Payer: Self-pay | Admitting: Plastic Surgery

## 2018-11-03 DIAGNOSIS — Z7984 Long term (current) use of oral hypoglycemic drugs: Secondary | ICD-10-CM | POA: Diagnosis not present

## 2018-11-03 DIAGNOSIS — Z794 Long term (current) use of insulin: Secondary | ICD-10-CM | POA: Diagnosis not present

## 2018-11-03 DIAGNOSIS — E1165 Type 2 diabetes mellitus with hyperglycemia: Secondary | ICD-10-CM | POA: Diagnosis not present

## 2018-11-03 DIAGNOSIS — E113311 Type 2 diabetes mellitus with moderate nonproliferative diabetic retinopathy with macular edema, right eye: Secondary | ICD-10-CM | POA: Diagnosis not present

## 2018-11-08 ENCOUNTER — Ambulatory Visit (INDEPENDENT_AMBULATORY_CARE_PROVIDER_SITE_OTHER): Payer: Medicare Other | Admitting: Plastic Surgery

## 2018-11-08 ENCOUNTER — Encounter: Payer: Self-pay | Admitting: Plastic Surgery

## 2018-11-08 VITALS — BP 180/68 | HR 88 | Temp 97.6°F | Ht 76.0 in | Wt 385.0 lb

## 2018-11-08 DIAGNOSIS — M952 Other acquired deformity of head: Secondary | ICD-10-CM

## 2018-11-08 NOTE — Progress Notes (Signed)
   Subjective:    Patient ID: Kenneth Patrick, male    DOB: [birth year], 68 y.o.   MRN: [id number]  The patient is a 68 year old man here for follow-up on his scalp defect.  He underwent excision of a skin cancer and had a Mohs defect.  He was taken to the operating room last week and underwent placement of ACell.  He is doing extremely well.  The ACell appears to be incorporating nicely.  The sore VAC is in place.  There is no sign of infection there is no hematoma or seroma noted.   Review of Systems  Constitutional: Negative.   HENT: Negative.   Eyes: Negative.   Respiratory: Negative.   Gastrointestinal: Negative.   Genitourinary: Negative.   Musculoskeletal: Negative.   Skin: Positive for wound.       Objective:   Physical Exam Vitals signs and nursing note reviewed.  Constitutional:      Appearance: Normal appearance.  HENT:     Head: Normocephalic.  Eyes:     Extraocular Movements: Extraocular movements intact.  Cardiovascular:     Rate and Rhythm: Normal rate.  Pulmonary:     Effort: Pulmonary effort is normal.  Neurological:     General: No focal deficit present.     Mental Status: He is alert.  Psychiatric:        Mood and Affect: Mood normal.        Thought Content: Thought content normal.        Judgment: Judgment normal.        Assessment & Plan:  Mohs defect of scalp We will leave the sore back on today and plan for removal next week.  He can shower.

## 2018-11-15 ENCOUNTER — Encounter: Payer: Self-pay | Admitting: Plastic Surgery

## 2018-11-15 ENCOUNTER — Ambulatory Visit (INDEPENDENT_AMBULATORY_CARE_PROVIDER_SITE_OTHER): Payer: Medicare Other | Admitting: Plastic Surgery

## 2018-11-15 VITALS — BP 157/74 | HR 85 | Temp 97.9°F | Ht 76.0 in | Wt 385.0 lb

## 2018-11-15 DIAGNOSIS — M952 Other acquired deformity of head: Secondary | ICD-10-CM

## 2018-11-15 DIAGNOSIS — Z9889 Other specified postprocedural states: Secondary | ICD-10-CM

## 2018-11-15 NOTE — Progress Notes (Signed)
   Subjective:    Patient ID: Kenneth Patrick, male    DOB: August 30, 1951, 68 y.o.   MRN: [id number]  The patient is a 68 year old male here for follow-up on his scalp wound.  He had a Mohs resection with clear margins.  ACell is in place.  It is incorporating well.  There is no sign of infection.  It is a little bit dry.  They have been doing KY once a day.  It does appear to be granulating underneath the ACell.   Review of Systems  Constitutional: Negative.   HENT: Negative.   Eyes: Negative.   Respiratory: Negative.   Gastrointestinal: Negative.   Endocrine: Negative.   Genitourinary: Negative.   Musculoskeletal: Negative.   Skin: Positive for wound.       Objective:   Physical Exam Vitals signs and nursing note reviewed.  Constitutional:      Appearance: Normal appearance.  HENT:     Head: Normocephalic and atraumatic.  Cardiovascular:     Rate and Rhythm: Normal rate.  Pulmonary:     Effort: Pulmonary effort is normal.  Neurological:     General: No focal deficit present.     Mental Status: He is alert.  Psychiatric:        Mood and Affect: Mood normal.        Thought Content: Thought content normal.        Judgment: Judgment normal.       Assessment & Plan:  Mohs defect of scalp  The sorbact was removed and the acell is in place.  It is slightly dry.  Recommend KY gel twice a day.

## 2018-11-18 ENCOUNTER — Encounter: Payer: Self-pay | Admitting: Plastic Surgery

## 2018-11-18 ENCOUNTER — Ambulatory Visit (INDEPENDENT_AMBULATORY_CARE_PROVIDER_SITE_OTHER): Payer: Medicare Other | Admitting: Plastic Surgery

## 2018-11-18 DIAGNOSIS — M952 Other acquired deformity of head: Secondary | ICD-10-CM

## 2018-11-18 NOTE — Progress Notes (Signed)
The patient is a 68 year old male here for follow-up on his Mohs defect on the scalp.  He change the dressing and was concerned he pulled some things off.  It actually looks good overall.  There is no sign of infection.  There is a little bit of exudate.  The ACell was a little bit dry at the edges but that is not unusual. Recommend he shower on Tuesday and see me later in the day.  We can probably put a little bit more donated ACell.  And start planning on a skin graft.

## 2018-11-21 ENCOUNTER — Other Ambulatory Visit: Payer: Self-pay | Admitting: "Endocrinology

## 2018-11-21 DIAGNOSIS — E118 Type 2 diabetes mellitus with unspecified complications: Secondary | ICD-10-CM | POA: Diagnosis not present

## 2018-11-21 DIAGNOSIS — Z944 Liver transplant status: Secondary | ICD-10-CM | POA: Diagnosis not present

## 2018-11-21 DIAGNOSIS — E1165 Type 2 diabetes mellitus with hyperglycemia: Secondary | ICD-10-CM | POA: Diagnosis not present

## 2018-11-22 ENCOUNTER — Ambulatory Visit (INDEPENDENT_AMBULATORY_CARE_PROVIDER_SITE_OTHER): Payer: Medicare Other | Admitting: Physician Assistant

## 2018-11-22 ENCOUNTER — Encounter: Payer: Self-pay | Admitting: Physician Assistant

## 2018-11-22 VITALS — BP 144/69 | HR 81 | Ht 76.0 in | Wt 385.0 lb

## 2018-11-22 DIAGNOSIS — M952 Other acquired deformity of head: Secondary | ICD-10-CM | POA: Diagnosis not present

## 2018-11-22 DIAGNOSIS — Z9889 Other specified postprocedural states: Secondary | ICD-10-CM

## 2018-11-22 LAB — COMPREHENSIVE METABOLIC PANEL
A/G RATIO: 1.3 (ref 1.2–2.2)
ALT: 38 IU/L (ref 0–44)
AST: 68 IU/L — AB (ref 0–40)
Albumin: 3.9 g/dL (ref 3.8–4.8)
Alkaline Phosphatase: 125 IU/L — ABNORMAL HIGH (ref 39–117)
BUN/Creatinine Ratio: 12 (ref 10–24)
BUN: 19 mg/dL (ref 8–27)
Bilirubin Total: 0.9 mg/dL (ref 0.0–1.2)
CO2: 24 mmol/L (ref 20–29)
Calcium: 9.5 mg/dL (ref 8.6–10.2)
Chloride: 102 mmol/L (ref 96–106)
Creatinine, Ser: 1.53 mg/dL — ABNORMAL HIGH (ref 0.76–1.27)
GFR calc Af Amer: 54 mL/min/{1.73_m2} — ABNORMAL LOW (ref >59)
GFR calc non Af Amer: 46 mL/min/{1.73_m2} — ABNORMAL LOW (ref >59)
Globulin, Total: 2.9 g/dL (ref 1.5–4.5)
Glucose: 89 mg/dL (ref 65–99)
POTASSIUM: 4.6 mmol/L (ref 3.5–5.2)
Sodium: 143 mmol/L (ref 134–144)
Total Protein: 6.8 g/dL (ref 6.0–8.5)

## 2018-11-22 LAB — HGB A1C W/O EAG: Hgb A1c MFr Bld: 8.3 % — ABNORMAL HIGH (ref 4.8–5.6)

## 2018-11-22 NOTE — Progress Notes (Signed)
  Subjective:     Patient ID: Kenneth Patrick, male   DOB: Oct 25, 1950, 68 y.o.   MRN: [id number]  HPI  Very pleasant 68 year old white male who is status post mohs procedure for basal cell carcinoma.  Pt was taken to the OR on 11/01/18 for preparation of scalp defect and placement of Acell.  Pt denies pain or discomfort.  He and his wife have been compliant with ky application and dressing changes.  Overall the wound is filling in.  Healthy granulation tissue noted.  No sign of infection. Review of Systems  Constitutional: Negative.   Respiratory: Negative.   Cardiovascular: Negative.   Gastrointestinal: Negative.   Skin: Positive for wound.  Psychiatric/Behavioral: Negative.        Objective:   Physical Exam Constitutional:      Appearance: Normal appearance.  Pulmonary:     Effort: Pulmonary effort is normal.  Abdominal:     Palpations: Abdomen is soft.  Skin:    General: Skin is warm and dry.  Neurological:     Mental Status: He is alert and oriented to person, place, and time.  Psychiatric:        Mood and Affect: Mood normal.        Behavior: Behavior normal.        Thought Content: Thought content normal.        Judgment: Judgment normal.    9 x 9 cm wound as a result of a Mohs procedure No erythema, mild discharge, no sign of infection    Assessment:     S/p Mohs surgery Scalp wound    Plan:     Placed donanted Acell on wound Adaptic, ky and gauze placed on wound Advised pt not to get it wet for a few days Dr. Marla Roe will put in to proceed with the skin graft procedure

## 2018-11-29 ENCOUNTER — Encounter: Payer: Self-pay | Admitting: "Endocrinology

## 2018-11-29 ENCOUNTER — Ambulatory Visit (INDEPENDENT_AMBULATORY_CARE_PROVIDER_SITE_OTHER): Payer: Medicare Other | Admitting: "Endocrinology

## 2018-11-29 VITALS — BP 135/75 | HR 77 | Ht 76.0 in | Wt 376.0 lb

## 2018-11-29 DIAGNOSIS — E782 Mixed hyperlipidemia: Secondary | ICD-10-CM

## 2018-11-29 DIAGNOSIS — E1165 Type 2 diabetes mellitus with hyperglycemia: Secondary | ICD-10-CM

## 2018-11-29 DIAGNOSIS — Z794 Long term (current) use of insulin: Secondary | ICD-10-CM | POA: Diagnosis not present

## 2018-11-29 DIAGNOSIS — E118 Type 2 diabetes mellitus with unspecified complications: Secondary | ICD-10-CM | POA: Diagnosis not present

## 2018-11-29 DIAGNOSIS — IMO0002 Reserved for concepts with insufficient information to code with codable children: Secondary | ICD-10-CM

## 2018-11-29 DIAGNOSIS — I10 Essential (primary) hypertension: Secondary | ICD-10-CM | POA: Diagnosis not present

## 2018-11-29 MED ORDER — INSULIN LISPRO 200 UNIT/ML ~~LOC~~ SOPN: 30.0000 [IU] | PEN_INJECTOR | Freq: Three times a day (TID) | SUBCUTANEOUS | 2 refills | Status: DC

## 2018-11-29 NOTE — Progress Notes (Signed)
Endocrinology follow-up note   Subjective:    Patient ID: Kenneth Patrick, male    DOB: 02-11-1951, PCP [name], MD   Past Medical History:  Diagnosis Date  . COPD (chronic obstructive pulmonary disease) (HCC)    uses inhalers  . Depression   . Diabetic neuropathy ([place])   . DM2 (diabetes mellitus, type 2) (HCC)    insulin dependent  . Dyspnea    with exertion  . Hypercholesterolemia   . Low back pain    no meds  . Respiratory infection   . Sleep apnea    uses CPAP   Past Surgical History:  Procedure Laterality Date  . APPLICATION OF A-CELL OF HEAD/NECK Left 11/01/2018   Procedure: APPLICATION OF A-CELL TO SCALP WOUND;  Surgeon: Wallace Going, DO;  Location: [facility];  Service: Plastics;  Laterality: Left;  . CHOLECYSTECTOMY    . DEBRIDEMENT AND CLOSURE WOUND Left 11/01/2018   Procedure: Debridement scalp wound;  Surgeon: Wallace Going, DO;  Location: [facility];  Service: Plastics;  Laterality: Left;  . left eye enucleation following trauma     Social History   Socioeconomic History  . Marital status: Married    Spouse name: Not on file  . Number of children: Not on file  . Years of education: Not on file  . Highest education level: Not on file  Occupational History  . Occupation: retired Engineer, structural  Social Needs  . Financial resource strain: Not on file  . Food insecurity:    Worry: Not on file    Inability: Not on file  . Transportation needs:    Medical: Not on file    Non-medical: Not on file  Tobacco Use  . Smoking status: Former Smoker    Packs/day: 1.50    Years: 20.00    Pack years: 30.00    Last attempt to quit: 10/05/2004    Years since quitting: 14.1  . Smokeless tobacco: Never Used  Substance and Sexual Activity  . Alcohol use: No    Alcohol/week: 0.0 standard drinks  . Drug use: No    Comment: stopped smoking marijuana in 2006  . Sexual activity: Not on file  Lifestyle  . Physical  activity:    Days per week: Not on file    Minutes per session: Not on file  . Stress: Not on file  Relationships  . Social connections:    Talks on phone: Not on file    Gets together: Not on file    Attends religious service: Not on file    Active member of club or organization: Not on file    Attends meetings of clubs or organizations: Not on file    Relationship status: Not on file  Other Topics Concern  . Not on file  Social History Narrative  . Not on file   Outpatient Encounter Medications as of 11/29/2018  Medication Sig  . albuterol (VENTOLIN HFA) 108 (90 BASE) MCG/ACT inhaler Inhale 2 puffs into the lungs every 6 (six) hours as needed.    [name] atorvastatin (LIPITOR) 80 MG tablet Take 80 mg by mouth At bedtime.   . Continuous Blood Gluc Sensor (FREESTYLE LIBRE SENSOR SYSTEM) MISC Use one sensor every 10 days.  . Fluticasone-Umeclidin-Vilant (TRELEGY ELLIPTA) 100-62.5-25 MCG/INH AEPB Inhale 1 puff into the lungs daily.  . furosemide (LASIX) 40 MG tablet Take 40 mg by mouth daily as needed for fluid.   [name] gabapentin (NEURONTIN) 300  MG capsule Take 300 mg by mouth 2 (two) times daily.   . Insulin Detemir (LEVEMIR FLEXTOUCH) 100 UNIT/ML Pen Inject 80 Units into the skin at bedtime.  . Insulin Lispro (HUMALOG KWIKPEN) 200 UNIT/ML SOPN Inject 30-36 Units into the skin 3 (three) times daily before meals.  . liraglutide (VICTOZA) 18 MG/3ML SOPN INJECT 1.8MG  SUBCUTANEOUSLY DAILY  . ONETOUCH VERIO test strip TEST AS DIRECTED 4 TIMES  DAILY  . oxymetazoline (AFRIN) 0.05 % nasal spray Place 1-2 sprays into the nose 2 (two) times daily as needed.   . potassium chloride SA (KLOR-CON M20) 20 MEQ tablet Take 1 tablet (20 mEq total) by mouth daily.  . sertraline (ZOLOFT) 100 MG tablet Take 200 mg by mouth daily.   . [DISCONTINUED] insulin lispro (HUMALOG) 100 UNIT/ML injection Inject 0.3-0.36 mLs (30-36 Units total) into the skin 3 (three) times daily with meals.   No facility-administered  encounter medications on file as of 11/29/2018.    ALLERGIES: Allergies  Allergen Reactions  . Penicillins     Childhood allergy    VACCINATION STATUS: Immunization History  Administered Date(s) Administered  . Influenza Split 10/23/2011  . Influenza Whole 09/04/2010  . Influenza,inj,Quad PF,6+ Mos 06/08/2013, 09/03/2014, 07/31/2015, 06/22/2016, 06/29/2018  . Pneumococcal Conjugate-13 03/05/2015  . Pneumococcal Polysaccharide-23 05/14/2011, 11/27/2013    Diabetes  He presents for his follow-up diabetic visit. He has type 2 diabetes mellitus. Onset time: He was diagnosed at approximate age of 53 years. His disease course has been improving. There are no hypoglycemic associated symptoms. Pertinent negatives for hypoglycemia include no confusion, headaches, pallor or seizures. There are no diabetic associated symptoms. Pertinent negatives for diabetes include no chest pain, no fatigue, no polydipsia, no polyphagia, no polyuria and no weakness. There are no hypoglycemic complications. Symptoms are improving. There are no diabetic complications. Risk factors for coronary artery disease include diabetes mellitus, dyslipidemia, hypertension, male sex, obesity, sedentary lifestyle and tobacco exposure. Current diabetic treatment includes insulin injections. He is compliant with treatment most of the time. His weight is fluctuating minimally. He is following a generally unhealthy diet. When asked about meal planning, he reported none. He has had a previous visit with a dietitian. He never participates in exercise. His home blood glucose trend is decreasing steadily. His breakfast blood glucose range is generally 180-200 mg/dl. His lunch blood glucose range is generally 180-200 mg/dl. His dinner blood glucose range is generally 180-200 mg/dl. His bedtime blood glucose range is generally >200 mg/dl. His overall blood glucose range is >200 mg/dl. Eye exam is current.  Hyperlipidemia  This is a chronic  problem. The current episode started more than 1 year ago. The problem is uncontrolled. Exacerbating diseases include diabetes and obesity. Pertinent negatives include no chest pain, myalgias or shortness of breath. Current antihyperlipidemic treatment includes statins. Compliance problems include adherence to diet and adherence to exercise.  Risk factors for coronary artery disease include dyslipidemia, diabetes mellitus, obesity, male sex and a sedentary lifestyle.  Hypertension  This is a chronic problem. The current episode started more than 1 year ago. The problem is controlled. Pertinent negatives include no chest pain, headaches, neck pain, palpitations or shortness of breath. Risk factors for coronary artery disease include diabetes mellitus, dyslipidemia, male gender, obesity, smoking/tobacco exposure and sedentary lifestyle. Compliance problems include diet and exercise.      Review of Systems  Constitutional: Negative for chills, fatigue, fever and unexpected weight change.  HENT: Negative for dental problem, mouth sores and trouble swallowing.  Eyes: Negative for visual disturbance.  Respiratory: Negative for cough, choking, chest tightness, shortness of breath and wheezing.   Cardiovascular: Negative for chest pain, palpitations and leg swelling.  Gastrointestinal: Negative for abdominal distention, abdominal pain, constipation, diarrhea, nausea and vomiting.  Endocrine: Negative for polydipsia, polyphagia and polyuria.  Genitourinary: Negative for dysuria, flank pain, hematuria and urgency.  Musculoskeletal: Negative for back pain, gait problem, myalgias and neck pain.  Skin: Negative for pallor, rash and wound.  Neurological: Negative for seizures, syncope, weakness, numbness and headaches.  Psychiatric/Behavioral: Negative.  Negative for confusion and dysphoric mood.    Objective:    BP 135/75   Pulse 77   Ht 6\' 4"  (1.93 m)   Wt (!) 376 lb (170.6 kg)   BMI 45.77 kg/m    Wt Readings from Last 3 Encounters:  11/29/18 (!) 376 lb (170.6 kg)  11/22/18 (!) 385 lb (174.6 kg)  11/15/18 (!) 385 lb (174.6 kg)    Physical Exam  Constitutional: He is oriented to person, place, and time. He appears well-developed. He is cooperative. No distress.  HENT:  Head: Normocephalic and atraumatic.  Eyes:  He wears dark heavy glasses due to reported prior injury to eyes.  Neck: Normal range of motion. Neck supple. No tracheal deviation present. No thyromegaly present.  Cardiovascular: Normal rate, S1 normal and S2 normal. Exam reveals no gallop.  No murmur heard. Pulses:      Dorsalis pedis pulses are 1+ on the right side and 1+ on the left side.       Posterior tibial pulses are 1+ on the right side and 1+ on the left side.  Pulmonary/Chest: Effort normal. No respiratory distress. He has no wheezes.  Abdominal: He exhibits no distension. There is no abdominal tenderness. There is no guarding and no CVA tenderness.  Musculoskeletal:        General: No edema.     Right shoulder: He exhibits no swelling and no deformity.  Neurological: He is alert and oriented to person, place, and time. He has normal strength. No cranial nerve deficit or sensory deficit. Gait normal.  Skin: Skin is warm and dry. No rash noted. No cyanosis. Nails show no clubbing.  Psychiatric: He has a normal mood and affect. His speech is normal. Judgment normal. Cognition and memory are normal.    Diabetic Labs (most recent): Lab Results  Component Value Date   HGBA1C 8.3 (H) 11/21/2018   HGBA1C 9.6 (H) 08/17/2018   HGBA1C 7.4 (H) 04/14/2018   Recent Results (from the past 2160 hour(s))  Glucose, capillary     Status: Abnormal   Collection Time: 11/01/18  6:59 AM  Result Value Ref Range   Glucose-Capillary 222 (H) 70 - 99 mg/dL  Glucose, capillary     Status: Abnormal   Collection Time: 11/01/18  8:49 AM  Result Value Ref Range   Glucose-Capillary 255 (H) 70 - 99 mg/dL  Comprehensive  metabolic panel     Status: Abnormal   Collection Time: 11/21/18  1:47 PM  Result Value Ref Range   Glucose 89 65 - 99 mg/dL   BUN 19 8 - 27 mg/dL   Creatinine, Ser 1.53 (H) 0.76 - 1.27 mg/dL   GFR calc non Af Amer 46 (L) >59 mL/min/1.73   GFR calc Af Amer 54 (L) >59 mL/min/1.73   BUN/Creatinine Ratio 12 10 - 24   Sodium 143 134 - 144 mmol/L   Potassium 4.6 3.5 - 5.2 mmol/L   Chloride 102 96 -  106 mmol/L   CO2 24 20 - 29 mmol/L   Calcium 9.5 8.6 - 10.2 mg/dL   Total Protein 6.8 6.0 - 8.5 g/dL   Albumin 3.9 3.8 - 4.8 g/dL    Comment:               **Please note reference interval change**   Globulin, Total 2.9 1.5 - 4.5 g/dL   Albumin/Globulin Ratio 1.3 1.2 - 2.2   Bilirubin Total 0.9 0.0 - 1.2 mg/dL   Alkaline Phosphatase 125 (H) 39 - 117 IU/L   AST 68 (H) 0 - 40 IU/L   ALT 38 0 - 44 IU/L  Hgb A1c w/o eAG     Status: Abnormal   Collection Time: 11/21/18  1:47 PM  Result Value Ref Range   Hgb A1c MFr Bld 8.3 (H) 4.8 - 5.6 %    Comment:          Prediabetes: 5.7 - 6.4          Diabetes: >6.4          Glycemic control for adults with diabetes: <7.0    Lipid Panel     Component Value Date/Time   CHOL 165 07/12/2017 1324   TRIG 100 07/12/2017 1324   HDL 46 07/12/2017 1324   CHOLHDL 4.0 06/26/2016 1403   VLDL 22 06/26/2016 1403   LDLCALC 99 07/12/2017 1324     Assessment & Plan:   1. Uncontrolled type 2 diabetes mellitus with complication, with long-term current use of insulin ([place])  -His diabetes is  complicated by morbid obesity and sleep apnea and patient remains at a high risk for more acute and chronic complications of diabetes which include CAD, CVA, CKD, retinopathy, and neuropathy. These are all discussed in detail with the patient.  Jumaane came with improved glycemic profile and A1c of 8.3%, improving from 9.6%.   -  Glucose logs and insulin administration records pertaining to this visit,  to be scanned into patient's records.  Recent labs reviewed.   - I  have re-counseled the patient on diet management and weight loss  by adopting a carbohydrate restricted / protein rich  Diet.  - Patient admits there is a room for improvement in his diet and drink choices. -  Suggestion is made for him to avoid simple carbohydrates  from his diet including Cakes, Sweet Desserts / Pastries, Ice Cream, Soda (diet and regular), Sweet Tea, Candies, Chips, Cookies, Store Bought Juices, Alcohol in Excess of  1-2 drinks a day, Artificial Sweeteners, and "Sugar-free" Products. This will help patient to have stable blood glucose profile and potentially avoid unintended weight gain.   - Patient is advised to stick to a routine mealtimes to eat 3 meals  a day and avoid unnecessary snacks ( to snack only to correct hypoglycemia).   - I have approached patient with the following individualized plan to manage diabetes and patient agrees.  -  His CGM snapshot analysis shows improved glycemic profile, supper time hyperglycemia.     -No significant documented or reported hypoglycemia.    -I have reapproached him for proper timing of meals and insulin administration.  -He is advised to continue Levemir 80 units nightly, continue Humalog 30 units 3 times daily before meals  for pre-meal BG readings of 90-150mg /dl, plus patient specific correction dose of rapid acting insulin for unexpected hyperglycemia above 150mg /dl, associated with strict documentation of blood glucose 4 times a day-before meals and at bedtime.  -He is advised to  shift some of his carbs from supper to lunch for proper distribution of his caloric exposure across the day.  -Adjustment parameters for hypo and hyperglycemia were given in a written document to patient. -Patient is encouraged to call clinic for blood glucose levels less than 70 or above 300 mg /dl. -He he is advised to continue Victoza 1.8 mg subcutaneously daily.    -Due to moderate renal insufficiency, I advised him to stay off of metformin for  now.   - Patient specific target  for A1c; LDL, HDL, Triglycerides, and  Waist Circumference were discussed in detail.  2) BP/HTN : His blood pressure is controlled to target.   He is advised to continue his current blood pressure medications, will be considered for low-dose lisinopril next visit.   3) Lipids/HPL: His recent lipid panel showed uncontrolled LDL at 99.  He is advised to continue atorvastatin 80 mg p.o. nightly.      4)  Weight/Diet: He lost 9 pounds since last visit.  He still admits to dietary discretion.   CDE consult in progress, exercise, and carbohydrates information provided.  5) Chronic Care/Health Maintenance:  -Patient is on ACEI/ARB and Statin medications and encouraged to continue to follow up with Ophthalmology, Podiatrist at least yearly or according to recommendations, and advised to  stay away from smoking. I have recommended yearly flu vaccine and pneumonia vaccination at least every 5 years; moderate intensity exercise for up to 150 minutes weekly; and  sleep for at least 7 hours a day.  - I advised patient to maintain close follow up with [name], MD for primary care needs.  - Time spent with the patient: 25 min, of which >50% was spent in reviewing his blood glucose logs , discussing his hypoglycemia and hyperglycemia episodes, reviewing his current and  previous labs / studies and medications  doses and developing a plan to avoid hypoglycemia and hyperglycemia. Please refer to Patient Instructions for Blood Glucose Monitoring and Insulin/Medications Dosing Guide"  in media tab for additional information. Kenneth Patrick participated in the discussions, expressed understanding, and voiced agreement with the above plans.  All questions were answered to his satisfaction. he is encouraged to contact clinic should he have any questions or concerns prior to his return visit.  Follow up plan: -Return in about 4 months (around 03/30/2019) for Meter, and Logs, Follow  up with Pre-visit Labs, Meter, and Logs.  Glade Lloyd, MD Phone: [phone]  Fax: [phone]   -  This note was partially dictated with voice recognition software. Similar sounding words can be transcribed inadequately or may not  be corrected upon review.  11/29/2018, 5:07 PM

## 2018-11-29 NOTE — Patient Instructions (Signed)

## 2018-12-02 ENCOUNTER — Encounter: Payer: Self-pay | Admitting: Plastic Surgery

## 2018-12-02 ENCOUNTER — Ambulatory Visit (INDEPENDENT_AMBULATORY_CARE_PROVIDER_SITE_OTHER): Payer: Medicare Other | Admitting: Plastic Surgery

## 2018-12-02 VITALS — BP 166/66 | HR 82 | Temp 97.8°F | Ht 76.0 in | Wt 376.0 lb

## 2018-12-02 DIAGNOSIS — M952 Other acquired deformity of head: Secondary | ICD-10-CM | POA: Diagnosis not present

## 2018-12-02 NOTE — Progress Notes (Signed)
   Subjective:    Patient ID: Kenneth Patrick, male    DOB: 04/10/1951, 68 y.o.   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  He has negative margins.  He underwent placement of ACell for a large defect.  It is now 5.5 x 5.5 cm in size.    Review of Systems  Constitutional: Negative.   HENT: Negative.   Eyes: Negative.   Respiratory: Negative.   Cardiovascular: Negative.   Genitourinary: Negative.   Musculoskeletal: Negative.   Hematological: Negative.   Psychiatric/Behavioral: Negative.        Objective:   Physical Exam Vitals signs and nursing note reviewed.  Constitutional:      Appearance: Normal appearance.  Cardiovascular:     Rate and Rhythm: Normal rate.  Pulmonary:     Effort: Pulmonary effort is normal.  Neurological:     Mental Status: He is alert.  Psychiatric:        Mood and Affect: Mood normal.        Thought Content: Thought content normal.        Assessment & Plan:  Mohs defect of scalp  The patient is ready for skin grafting to the scalp. Will arrange.  Graft to come from left thigh area.

## 2018-12-07 DIAGNOSIS — E113211 Type 2 diabetes mellitus with mild nonproliferative diabetic retinopathy with macular edema, right eye: Secondary | ICD-10-CM | POA: Diagnosis not present

## 2018-12-07 DIAGNOSIS — H31091 Other chorioretinal scars, right eye: Secondary | ICD-10-CM | POA: Diagnosis not present

## 2018-12-07 DIAGNOSIS — Z970 Presence of artificial eye: Secondary | ICD-10-CM | POA: Diagnosis not present

## 2018-12-09 ENCOUNTER — Ambulatory Visit: Payer: Medicare Other | Admitting: Plastic Surgery

## 2018-12-12 NOTE — Pre-Procedure Instructions (Signed)
Kenneth Patrick  12/12/2018      [facility] [address] [place] [address] [place]  [address] Phone: [phone] Fax: [phone]    Your procedure is scheduled on Wednesday 12/21/18  Report to [facility], Entrance A at [email or web address].  Call this number if you have problems the morning of surgery:  [phone]   Remember:  Do not eat or drink after midnight.     Take these medicines the morning of surgery with A SIP OF WATER               Zoloft (Sertraline), Fluticasone-Umeclidin-Vilant(trelegy Ellipta), Gabapentin                          (Neurontin)              As needed Albuterol (Ventolin HFA), oxymetazoline (AFRIN)              Starting 12/14/18  STOP taking any Aspirin (unless otherwise instructed by your surgeon), Aleve, Naproxen, Ibuprofen, Motrin, Advil, Goody's, BC's, all herbal medications, fish oil, and all vitamins.    How to Manage Your Diabetes Before and After Surgery  Why is it important to control my blood sugar before and after surgery? . Improving blood sugar levels before and after surgery helps healing and can limit problems. . A way of improving blood sugar control is eating a healthy diet by: o  Eating less sugar and carbohydrates o  Increasing activity/exercise o  Talking with your doctor about reaching your blood sugar goals . High blood sugars (greater than 180 mg/dL) can raise your risk of infections and slow your recovery, so you will need to focus on controlling your diabetes during the weeks before surgery. . Make sure that the doctor who takes care of your diabetes knows about your planned surgery including the date and location.  How do I manage my blood sugar before surgery? . Check your blood sugar at least 4 times a day, starting 2 days before surgery, to make sure that the level is not too high or low. o Check your blood sugar the morning of your surgery when you wake up and every 2 hours until  you get to the Short Stay unit. . If your blood sugar is less than 70 mg/dL, you will need to treat for low blood sugar: o Do not take insulin. o Treat a low blood sugar (less than 70 mg/dL) with  cup of clear juice (cranberry or apple), 4 glucose tablets, OR glucose gel. Recheck blood sugar in 15 minutes after treatment (to make sure it is greater than 70 mg/dL). If your blood sugar is not greater than 70 mg/dL on recheck, call [phone] o  for further instructions. . Report your blood sugar to the short stay nurse when you get to Short Stay.  . If you are admitted to the hospital after surgery: o Your blood sugar will be checked by the staff and you will probably be given insulin after surgery (instead of oral diabetes medicines) to make sure you have good blood sugar levels. o The goal for blood sugar control after surgery is 80-180 mg/dL.     WHAT DO I DO ABOUT MY DIABETES MEDICATION?   [name] Do not take oral diabetes medicines (pills) the morning of surgery.  . THE NIGHT BEFORE SURGERY, take ___________ units of ___________insulin.       [name]  THE MORNING OF SURGERY, take _____________ units of __________insulin.  . The day of surgery, do not take other diabetes injectables, including Byetta (exenatide), Bydureon (exenatide ER), Victoza (liraglutide), or Trulicity (dulaglutide).  . If your CBG is greater than 220 mg/dL, you may take  of your sliding scale (correction) dose of insulin.  Reviewed and Endorsed by [facility] Patient Education Committee, August 2015          Do not wear jewelry.  Do not wear lotions, powders, or colognes, or deodorant.  Do not shave 48 hours prior to surgery.  Men may shave face and neck.  Do not bring valuables to the hospital.  [facility] is not responsible for any belongings or valuables.  Contacts, dentures or bridgework may not be worn into surgery.  Leave your suitcase in the car.  After surgery it may be brought to your room.  For  patients admitted to the hospital, discharge time will be determined by your treatment team.  Patients discharged the day of surgery will not be allowed to drive home.   Special instructions:    [facility]- Preparing For Surgery  Before surgery, you can play an important role. Because skin is not sterile, your skin needs to be as free of germs as possible. You can reduce the number of germs on your skin by washing with CHG (chlorahexidine gluconate) Soap before surgery.  CHG is an antiseptic cleaner which kills germs and bonds with the skin to continue killing germs even after washing.    Oral Hygiene is also important to reduce your risk of infection.  Remember - BRUSH YOUR TEETH THE MORNING OF SURGERY WITH YOUR REGULAR TOOTHPASTE  Please do not use if you have an allergy to CHG or antibacterial soaps. If your skin becomes reddened/irritated stop using the CHG.  Do not shave (including legs and underarms) for at least 48 hours prior to first CHG shower. It is OK to shave your face.  Please follow these instructions carefully.   1. Shower the NIGHT BEFORE SURGERY and the MORNING OF SURGERY with CHG.   2. If you chose to wash your hair, wash your hair first as usual with your normal shampoo.  3. After you shampoo, rinse your hair and body thoroughly to remove the shampoo.  4. Use CHG as you would any other liquid soap. You can apply CHG directly to the skin and wash gently with a scrungie or a clean washcloth.   5. Apply the CHG Soap to your body ONLY FROM THE NECK DOWN.  Do not use on open wounds or open sores. Avoid contact with your eyes, ears, mouth and genitals (private parts). Wash Face and genitals (private parts)  with your normal soap.  6. Wash thoroughly, paying special attention to the area where your surgery will be performed.  7. Thoroughly rinse your body with warm water from the neck down.  8. DO NOT shower/wash with your normal soap after using and rinsing off the CHG  Soap.  9. Pat yourself dry with a CLEAN TOWEL.  10. Wear CLEAN PAJAMAS to bed the night before surgery, wear comfortable clothes the morning of surgery  11. Place CLEAN SHEETS on your bed the night of your first shower and DO NOT SLEEP WITH PETS.  Day of Surgery:  Do not apply any deodorants/lotions.  Please wear clean clothes to the hospital/surgery center.   Remember to brush your teeth WITH YOUR REGULAR TOOTHPASTE.  Please read over the following fact  sheets that you were given. Pain Booklet, Coughing and Deep Breathing and Surgical Site Infection Prevention

## 2018-12-13 ENCOUNTER — Encounter (HOSPITAL_COMMUNITY)
Admission: RE | Admit: 2018-12-13 | Discharge: 2018-12-13 | Disposition: A | Discharge status: Home / Self Care | Payer: Medicare Other | Source: Ambulatory Visit | Attending: Plastic Surgery | Admitting: Plastic Surgery

## 2018-12-13 ENCOUNTER — Other Ambulatory Visit: Payer: Self-pay

## 2018-12-13 ENCOUNTER — Encounter (HOSPITAL_COMMUNITY): Payer: Self-pay

## 2018-12-13 DIAGNOSIS — Z01812 Encounter for preprocedural laboratory examination: Secondary | ICD-10-CM | POA: Diagnosis not present

## 2018-12-13 HISTORY — DX: Malignant (primary) neoplasm, unspecified: C80.1

## 2018-12-13 LAB — GLUCOSE, CAPILLARY: Glucose-Capillary: 169 mg/dL — ABNORMAL HIGH (ref 70–99)

## 2018-12-13 LAB — BASIC METABOLIC PANEL
Anion gap: 10 (ref 5–15)
BUN: 21 mg/dL (ref 8–23)
CALCIUM: 9.4 mg/dL (ref 8.9–10.3)
CO2: 22 mmol/L (ref 22–32)
Chloride: 107 mmol/L (ref 98–111)
Creatinine, Ser: 1.44 mg/dL — ABNORMAL HIGH (ref 0.61–1.24)
GFR calc Af Amer: 58 mL/min — ABNORMAL LOW (ref >60)
GFR calc non Af Amer: 50 mL/min — ABNORMAL LOW (ref >60)
Glucose, Bld: 204 mg/dL — ABNORMAL HIGH (ref 70–99)
Potassium: 5.1 mmol/L (ref 3.5–5.1)
Sodium: 139 mmol/L (ref 135–145)

## 2018-12-13 LAB — CBC
HCT: 36.8 % — ABNORMAL LOW (ref 39.0–52.0)
Hemoglobin: 11.5 g/dL — ABNORMAL LOW (ref 13.0–17.0)
MCH: 28.5 pg (ref 26.0–34.0)
MCHC: 31.3 g/dL (ref 30.0–36.0)
MCV: 91.3 fL (ref 80.0–100.0)
Platelets: 229 10*3/uL (ref 150–400)
RBC: 4.03 MIL/uL — AB (ref 4.22–5.81)
RDW: 14.6 % (ref 11.5–15.5)
WBC: 7.1 10*3/uL (ref 4.0–10.5)
nRBC: 0 % (ref 0.0–0.2)

## 2018-12-13 NOTE — Pre-Procedure Instructions (Signed)
Kenneth Patrick  12/13/2018      [facility] [address] [place] [address] Phone: [phone] Fax: [phone]    Your procedure is scheduled on Wednesday 12/21/18  Report to [facility], Entrance A at [place].M.  Call this number if you have problems the morning of surgery:  [phone]   Remember:  Do not eat or drink after midnight.     Take these medicines the morning of surgery with A SIP OF WATER               Zoloft (Sertraline), Fluticasone-Umeclidin-Vilant(trelegy Ellipta), Gabapentin                          (Neurontin)              As needed Albuterol (Ventolin HFA), oxymetazoline (AFRIN)              Starting 12/14/18  STOP taking any Aspirin (unless otherwise instructed by your surgeon), Aleve, Naproxen, Ibuprofen, Motrin, Advil, Goody's, BC's, all herbal medications, fish oil, and all vitamins.    How to Manage Your Diabetes Before and After Surgery  Why is it important to control my blood sugar before and after surgery? . Improving blood sugar levels before and after surgery helps healing and can limit problems. . A way of improving blood sugar control is eating a healthy diet by: o  Eating less sugar and carbohydrates o  Increasing activity/exercise o  Talking with your doctor about reaching your blood sugar goals . High blood sugars (greater than 180 mg/dL) can raise your risk of infections and slow your recovery, so you will need to focus on controlling your diabetes during the weeks before surgery. . Make sure that the doctor who takes care of your diabetes knows about your planned surgery including the date and location.  How do I manage my blood sugar before surgery? . Check your blood sugar at least 4 times a day, starting 2 days before surgery, to make sure that the level is not too high or low. o Check your blood sugar the morning of your surgery when you wake up and every 2 hours until  you get to the Short Stay unit. . If your blood sugar is less than 70 mg/dL, you will need to treat for low blood sugar: o Do not take insulin. o Treat a low blood sugar (less than 70 mg/dL) with  cup of clear juice (cranberry or apple), 4 glucose tablets, OR glucose gel. Recheck blood sugar in 15 minutes after treatment (to make sure it is greater than 70 mg/dL). If your blood sugar is not greater than 70 mg/dL on recheck, call [phone] o  for further instructions. . Report your blood sugar to the short stay nurse when you get to Short Stay.  . If you are admitted to the hospital after surgery: o Your blood sugar will be checked by the staff and you will probably be given insulin after surgery (instead of oral diabetes medicines) to make sure you have good blood sugar levels. o The goal for blood sugar control after surgery is 80-180 mg/dL.     WHAT DO I DO ABOUT MY DIABETES MEDICATION?   [name] Do not take oral diabetes medicines (pills) the morning of surgery.  . THE NIGHT BEFORE SURGERY, take ____40____ units of Insulin Detemir (Levemir) insulin.   [name]  The day of surgery, do not take other diabetes injectables, including Byetta (exenatide), Bydureon (exenatide ER), Victoza (liraglutide), or Trulicity (dulaglutide).  . If your CBG is greater than 220 mg/dL, you may take  of your sliding scale (correction) dose of insulin.  Reviewed and Endorsed by [facility] Patient Education Committee, August 2015          Do not wear jewelry.  Do not wear lotions, powders, or colognes, or deodorant.  Do not shave 48 hours prior to surgery.  Men may shave face and neck.  Do not bring valuables to the hospital.  [facility] is not responsible for any belongings or valuables.  Contacts, dentures or bridgework may not be worn into surgery.  Leave your suitcase in the car.  After surgery it may be brought to your room.  For patients admitted to the hospital, discharge time will be determined by  your treatment team.  Patients discharged the day of surgery will not be allowed to drive home.   Special instructions:    [name]- Preparing For Surgery  Before surgery, you can play an important role. Because skin is not sterile, your skin needs to be as free of germs as possible. You can reduce the number of germs on your skin by washing with CHG (chlorahexidine gluconate) Soap before surgery.  CHG is an antiseptic cleaner which kills germs and bonds with the skin to continue killing germs even after washing.    Oral Hygiene is also important to reduce your risk of infection.  Remember - BRUSH YOUR TEETH THE MORNING OF SURGERY WITH YOUR REGULAR TOOTHPASTE  Please do not use if you have an allergy to CHG or antibacterial soaps. If your skin becomes reddened/irritated stop using the CHG.  Do not shave (including legs and underarms) for at least 48 hours prior to first CHG shower. It is OK to shave your face.  Please follow these instructions carefully.   1. Shower the NIGHT BEFORE SURGERY and the MORNING OF SURGERY with CHG.   2. If you chose to wash your hair, wash your hair first as usual with your normal shampoo.  3. After you shampoo, rinse your hair and body thoroughly to remove the shampoo.  4. Use CHG as you would any other liquid soap. You can apply CHG directly to the skin and wash gently with a scrungie or a clean washcloth.   5. Apply the CHG Soap to your body ONLY FROM THE NECK DOWN.  Do not use on open wounds or open sores. Avoid contact with your eyes, ears, mouth and genitals (private parts). Wash Face and genitals (private parts)  with your normal soap.  6. Wash thoroughly, paying special attention to the area where your surgery will be performed.  7. Thoroughly rinse your body with warm water from the neck down.  8. DO NOT shower/wash with your normal soap after using and rinsing off the CHG Soap.  9. Pat yourself dry with a CLEAN TOWEL.  10. Wear CLEAN  PAJAMAS to bed the night before surgery, wear comfortable clothes the morning of surgery  11. Place CLEAN SHEETS on your bed the night of your first shower and DO NOT SLEEP WITH PETS.  Day of Surgery:  Do not apply any deodorants/lotions.  Please wear clean clothes to the hospital/surgery center.   Remember to brush your teeth WITH YOUR REGULAR TOOTHPASTE.  Please read over the following fact sheets that you were given. Pain Booklet, Coughing and Deep Breathing  and Surgical Site Infection Prevention

## 2018-12-13 NOTE — Progress Notes (Signed)
PCP - Jerene Bears Cardiologist - denied  Chest x-ray - N/A EKG - 10/31/18 Stress Test - 20+ years ago ECHO - 04/08/17 Cardiac Cath -denies   Sleep Study - OSA+ CPAP -uses nightly.   Fasting Blood Sugar - 110-150's Checks Blood Sugar 4 times a day, minimum.   Blood Thinner Instructions:N/A Aspirin Instructions:N/A  Anesthesia review: Yes, last A1C 8.3  Patient denies shortness of breath, fever, cough and chest pain at PAT appointment   Patient verbalized understanding of instructions that were given to them at the PAT appointment. Patient was also instructed that they will need to review over the PAT instructions again at home before surgery.

## 2018-12-14 ENCOUNTER — Encounter (HOSPITAL_COMMUNITY): Payer: Self-pay | Admitting: Physician Assistant

## 2018-12-16 ENCOUNTER — Other Ambulatory Visit: Payer: Self-pay

## 2018-12-16 ENCOUNTER — Encounter: Payer: Self-pay | Admitting: Plastic Surgery

## 2018-12-16 ENCOUNTER — Ambulatory Visit (INDEPENDENT_AMBULATORY_CARE_PROVIDER_SITE_OTHER): Payer: Medicare Other | Admitting: Plastic Surgery

## 2018-12-16 VITALS — BP 153/73 | HR 95 | Temp 97.9°F | Ht 76.0 in | Wt 376.0 lb

## 2018-12-16 DIAGNOSIS — M952 Other acquired deformity of head: Secondary | ICD-10-CM

## 2018-12-16 NOTE — Progress Notes (Signed)
Patient ID: Kenneth Patrick, male    DOB: [birth year], 68 y.o.   MRN: [id number]   Chief Complaint  Patient presents with  . Skin Problem    The patient is a 68 year old male here for his history and physical for scalp reconstruction.  The patient decided that he would prefer to have the wound heal secondarily.  Surgery will be canceled.  The wound size is now 3 x 4 cm.  He has healed at least a centimeter circumferentially in the past 2 weeks.  He has been using K-Y jelly daily.  The area is clean.  The area is also flush with the surrounding skin and he has begun epithelializing.   Review of Systems  Constitutional: Negative.   HENT: Negative.   Eyes: Negative.   Respiratory: Negative.   Cardiovascular: Negative.   Gastrointestinal: Negative.   Endocrine: Negative.   Genitourinary: Negative.   Musculoskeletal: Negative.   Skin: Positive for wound.  Hematological: Negative.   Psychiatric/Behavioral: Negative.     Past Medical History:  Diagnosis Date  . Cancer ([facility])    basal cell   . COPD (chronic obstructive pulmonary disease) ([facility])    uses inhalers  . Depression   . Diabetic neuropathy ([facility])   . DM2 (diabetes mellitus, type 2) ([facility])    insulin dependent  . Dyspnea    with exertion  . Hypercholesterolemia   . Low back pain    no meds  . Respiratory infection   . Sleep apnea    uses CPAP    Past Surgical History:  Procedure Laterality Date  . APPLICATION OF A-CELL OF HEAD/NECK Left 11/01/2018   Procedure: APPLICATION OF A-CELL TO SCALP WOUND;  Surgeon: Wallace Going, DO;  Location: [facility];  Service: Plastics;  Laterality: Left;  . CHOLECYSTECTOMY    . DEBRIDEMENT AND CLOSURE WOUND Left 11/01/2018   Procedure: Debridement scalp wound;  Surgeon: Wallace Going, DO;  Location: [facility];  Service: Plastics;  Laterality: Left;  . left eye enucleation following trauma        Current Outpatient Medications:  .   albuterol (VENTOLIN HFA) 108 (90 BASE) MCG/ACT inhaler, Inhale 2 puffs into the lungs every 6 (six) hours as needed for wheezing or shortness of breath. , Disp: , Rfl:  .  atorvastatin (LIPITOR) 80 MG tablet, Take 80 mg by mouth at bedtime. , Disp: , Rfl:  .  Continuous Blood Gluc Sensor ([place]) MISC, Use one sensor every 10 days., Disp: 3 each, Rfl: 2 .  Fluticasone-Umeclidin-Vilant (TRELEGY ELLIPTA) 100-62.5-25 MCG/INH AEPB, Inhale 1 puff into the lungs daily., Disp: 60 each, Rfl: 11 .  furosemide (LASIX) 40 MG tablet, Take 40 mg by mouth daily as needed for fluid. , Disp: , Rfl:  .  gabapentin (NEURONTIN) 300 MG capsule, Take 300 mg by mouth 2 (two) times daily. , Disp: , Rfl:  .  Insulin Detemir (LEVEMIR FLEXTOUCH) 100 UNIT/ML Pen, Inject 80 Units into the skin at bedtime., Disp: 10 pen, Rfl: 2 .  Insulin Lispro (HUMALOG KWIKPEN) 200 UNIT/ML SOPN, Inject 30-36 Units into the skin 3 (three) times daily before meals., Disp: 5 pen, Rfl: 2 .  liraglutide (VICTOZA) 18 MG/3ML SOPN, INJECT 1.8MG  SUBCUTANEOUSLY DAILY (Patient taking differently: Inject 1.8 mg into the skin daily. ), Disp: 27 mL, Rfl: 0 .  oxymetazoline (AFRIN) 0.05 % nasal spray, Place 1-2 sprays into both nostrils 2 (two) times daily as needed  for congestion. , Disp: , Rfl:  .  potassium chloride SA (KLOR-CON M20) 20 MEQ tablet, Take 1 tablet (20 mEq total) by mouth daily. (Patient not taking: Reported on 12/12/2018), Disp: 90 tablet, Rfl: 1 .  sertraline (ZOLOFT) 100 MG tablet, Take 200 mg by mouth daily. , Disp: , Rfl:    Objective:   There were no vitals filed for this visit.  Physical Exam Constitutional:      Appearance: Normal appearance.  HENT:     Head: Normocephalic.      Nose: Nose normal.     Mouth/Throat:     Mouth: Mucous membranes are moist.  Eyes:     Extraocular Movements: Extraocular movements intact.  Cardiovascular:     Rate and Rhythm: Normal rate.  Pulmonary:     Effort:  Pulmonary effort is normal.  Abdominal:     General: Abdomen is flat.  Neurological:     Mental Status: He is alert.  Psychiatric:        Mood and Affect: Mood normal.        Behavior: Behavior normal.        Thought Content: Thought content normal.     Assessment & Plan:  Mohs defect of scalp  With as much epithelialization that is occurred in the last 2 months I think that it is reasonable to hold off on surgery.  We will recommend collagen to the area daily.  If it stalls out we can try some more ACell.  The patient is doing very well understands the risks and the complications and canceling the surgery.  I would like to see him back in 3 weeks.  [place], DO

## 2018-12-19 DIAGNOSIS — E113311 Type 2 diabetes mellitus with moderate nonproliferative diabetic retinopathy with macular edema, right eye: Secondary | ICD-10-CM | POA: Diagnosis not present

## 2018-12-19 DIAGNOSIS — H25811 Combined forms of age-related cataract, right eye: Secondary | ICD-10-CM | POA: Diagnosis not present

## 2018-12-21 ENCOUNTER — Encounter (HOSPITAL_COMMUNITY): Admission: RE | Payer: Self-pay | Source: Home / Self Care

## 2018-12-21 ENCOUNTER — Ambulatory Visit (HOSPITAL_COMMUNITY): Admission: RE | Admit: 2018-12-21 | Payer: Medicare Other | Source: Home / Self Care | Admitting: Plastic Surgery

## 2018-12-21 SURGERY — APPLICATION, GRAFT, SKIN, SPLIT-THICKNESS
Anesthesia: General | Site: Scalp

## 2018-12-27 ENCOUNTER — Other Ambulatory Visit: Payer: Self-pay

## 2018-12-27 ENCOUNTER — Ambulatory Visit (INDEPENDENT_AMBULATORY_CARE_PROVIDER_SITE_OTHER): Payer: Medicare Other | Admitting: Plastic Surgery

## 2018-12-27 ENCOUNTER — Encounter: Payer: Self-pay | Admitting: Plastic Surgery

## 2018-12-27 VITALS — BP 144/83 | HR 84 | Temp 98.6°F | Ht 76.0 in | Wt 383.0 lb

## 2018-12-27 DIAGNOSIS — M952 Other acquired deformity of head: Secondary | ICD-10-CM | POA: Diagnosis not present

## 2018-12-27 NOTE — Progress Notes (Signed)
   Subjective:    Patient ID: Kenneth Patrick, male    DOB: 05/02/1951, 68 y.o.   MRN: [id number]  The patient is a 68 yrs old male here for follow up on his mohs scalp defect.  He has done very well and continues to heal.  The wound is now 2.5 x 3 cm and flush with the surrounding skin.  No sign of infection.   Review of Systems  Constitutional: Negative.   HENT: Negative.   Eyes: Negative.   Respiratory: Negative.   Cardiovascular: Negative.   Gastrointestinal: Negative.   Endocrine: Negative.   Genitourinary: Negative.   Musculoskeletal: Negative.        Objective:   Physical Exam Vitals signs and nursing note reviewed.  Constitutional:      Appearance: Normal appearance.  HENT:     Head: Normocephalic.      Nose: Nose normal.  Cardiovascular:     Rate and Rhythm: Normal rate.  Pulmonary:     Effort: Pulmonary effort is normal.  Skin:    General: Skin is warm.     Capillary Refill: Capillary refill takes less than 2 seconds.  Neurological:     General: No focal deficit present.     Mental Status: He is alert and oriented to person, place, and time.  Psychiatric:        Mood and Affect: Mood normal.        Behavior: Behavior normal.        Assessment & Plan:  Mohs defect of scalp  Continue with the vaseline if not able to get the collagen.  Follow up in one month.  Shower and keep clean.

## 2018-12-28 ENCOUNTER — Encounter: Payer: Medicare Other | Admitting: Plastic Surgery

## 2018-12-28 DIAGNOSIS — E113211 Type 2 diabetes mellitus with mild nonproliferative diabetic retinopathy with macular edema, right eye: Secondary | ICD-10-CM | POA: Diagnosis not present

## 2019-01-06 DIAGNOSIS — Z6841 Body Mass Index (BMI) 40.0 and over, adult: Secondary | ICD-10-CM | POA: Diagnosis not present

## 2019-01-06 DIAGNOSIS — J449 Chronic obstructive pulmonary disease, unspecified: Secondary | ICD-10-CM | POA: Diagnosis not present

## 2019-01-06 DIAGNOSIS — E7800 Pure hypercholesterolemia, unspecified: Secondary | ICD-10-CM | POA: Diagnosis not present

## 2019-01-06 DIAGNOSIS — E1165 Type 2 diabetes mellitus with hyperglycemia: Secondary | ICD-10-CM | POA: Diagnosis not present

## 2019-01-06 DIAGNOSIS — E1142 Type 2 diabetes mellitus with diabetic polyneuropathy: Secondary | ICD-10-CM | POA: Diagnosis not present

## 2019-01-06 DIAGNOSIS — Z299 Encounter for prophylactic measures, unspecified: Secondary | ICD-10-CM | POA: Diagnosis not present

## 2019-01-14 ENCOUNTER — Other Ambulatory Visit: Payer: Self-pay | Admitting: "Endocrinology

## 2019-01-16 ENCOUNTER — Other Ambulatory Visit: Payer: Self-pay

## 2019-01-16 MED ORDER — LIRAGLUTIDE 18 MG/3ML ~~LOC~~ SOPN: PEN_INJECTOR | SUBCUTANEOUS | 0 refills | Status: DC

## 2019-01-30 ENCOUNTER — Telehealth: Payer: Self-pay | Admitting: Plastic Surgery

## 2019-01-30 NOTE — Telephone Encounter (Signed)
Called patient to confirm appointment scheduled for tomorrow. Patient answered the following questions: °1.Has the patient traveled outside of the state of [place] at all within the past 6 weeks? No °2.Does the patient have a fever or cough at all? No °3.Has the patient been tested for COVID? Had a positive COVID test? No °4. Has the patient been in contact with anyone who has tested positive? No ° °

## 2019-01-31 ENCOUNTER — Ambulatory Visit (INDEPENDENT_AMBULATORY_CARE_PROVIDER_SITE_OTHER): Payer: Medicare Other | Admitting: Nurse Practitioner

## 2019-01-31 ENCOUNTER — Other Ambulatory Visit: Payer: Self-pay

## 2019-01-31 ENCOUNTER — Encounter: Payer: Self-pay | Admitting: Plastic Surgery

## 2019-01-31 VITALS — BP 158/73 | HR 88 | Temp 98.2°F | Wt 378.0 lb

## 2019-01-31 DIAGNOSIS — Z9889 Other specified postprocedural states: Secondary | ICD-10-CM

## 2019-01-31 DIAGNOSIS — M952 Other acquired deformity of head: Secondary | ICD-10-CM | POA: Diagnosis not present

## 2019-01-31 NOTE — Progress Notes (Signed)
  Subjective: Kenneth Patrick is a 68 yo male here for f/u on his mohs scalp defect. He has been applying vaseline and covering the site with a dry dressing. States there is "minimal drainage." He is very happy with his progress.   Objective: BP: 158/73 left arm, sitting Pulse: 88 Temp: 98.2 oral SpO2: 94% Weight: 171.5 kg     Gen: awake, alert, no acute distress Chest: symmetrical rise and fal Lungs: unlabored breathing Skin: healing scalp wound with 0.5cmx0.5cm open area with center of wound, red and pink scalp discoloration MSK: MAEx4 Neuro: A&O x3   Assessment/Plan: s/p debridement of scalp wound on 11/01/18. The wound is healing well and is nearly closed.   -Continue vaseline and dry dressing until fully healed -Follow up as needed    Mayah Dozier-Lineberger 01/31/2019

## 2019-02-06 ENCOUNTER — Other Ambulatory Visit: Payer: Self-pay

## 2019-02-06 MED ORDER — INSULIN LISPRO 200 UNIT/ML ~~LOC~~ SOPN: 30.0000 [IU] | PEN_INJECTOR | Freq: Three times a day (TID) | SUBCUTANEOUS | 0 refills | Status: DC

## 2019-02-08 ENCOUNTER — Other Ambulatory Visit (HOSPITAL_COMMUNITY): Payer: Medicare Other

## 2019-02-13 ENCOUNTER — Ambulatory Visit (HOSPITAL_COMMUNITY): Admit: 2019-02-13 | Payer: Medicare Other | Admitting: Ophthalmology

## 2019-02-13 ENCOUNTER — Encounter (HOSPITAL_COMMUNITY): Payer: Self-pay

## 2019-02-13 SURGERY — PHACOEMULSIFICATION, CATARACT, WITH IOL INSERTION
Anesthesia: Monitor Anesthesia Care | Laterality: Right

## 2019-02-22 DIAGNOSIS — E113211 Type 2 diabetes mellitus with mild nonproliferative diabetic retinopathy with macular edema, right eye: Secondary | ICD-10-CM | POA: Diagnosis not present

## 2019-03-10 ENCOUNTER — Other Ambulatory Visit: Payer: Self-pay | Admitting: "Endocrinology

## 2019-03-21 ENCOUNTER — Other Ambulatory Visit: Payer: Self-pay | Admitting: "Endocrinology

## 2019-03-21 DIAGNOSIS — Z794 Long term (current) use of insulin: Secondary | ICD-10-CM | POA: Diagnosis not present

## 2019-03-21 DIAGNOSIS — E1165 Type 2 diabetes mellitus with hyperglycemia: Secondary | ICD-10-CM | POA: Diagnosis not present

## 2019-03-21 DIAGNOSIS — E118 Type 2 diabetes mellitus with unspecified complications: Secondary | ICD-10-CM | POA: Diagnosis not present

## 2019-03-22 LAB — COMPREHENSIVE METABOLIC PANEL
ALT: 42 IU/L (ref 0–44)
AST: 57 IU/L — ABNORMAL HIGH (ref 0–40)
Albumin/Globulin Ratio: 1.8 (ref 1.2–2.2)
Albumin: 4.3 g/dL (ref 3.8–4.8)
Alkaline Phosphatase: 129 IU/L — ABNORMAL HIGH (ref 39–117)
BUN/Creatinine Ratio: 17 (ref 10–24)
BUN: 22 mg/dL (ref 8–27)
Bilirubin Total: 1 mg/dL (ref 0.0–1.2)
CO2: 22 mmol/L (ref 20–29)
Calcium: 10.1 mg/dL (ref 8.6–10.2)
Chloride: 103 mmol/L (ref 96–106)
Creatinine, Ser: 1.32 mg/dL — ABNORMAL HIGH (ref 0.76–1.27)
GFR calc Af Amer: 64 mL/min/{1.73_m2} (ref >59)
GFR calc non Af Amer: 55 mL/min/{1.73_m2} — ABNORMAL LOW (ref >59)
Globulin, Total: 2.4 g/dL (ref 1.5–4.5)
Glucose: 188 mg/dL — ABNORMAL HIGH (ref 65–99)
Potassium: 5.5 mmol/L — ABNORMAL HIGH (ref 3.5–5.2)
Sodium: 145 mmol/L — ABNORMAL HIGH (ref 134–144)
Total Protein: 6.7 g/dL (ref 6.0–8.5)

## 2019-03-22 LAB — HGB A1C W/O EAG: Hgb A1c MFr Bld: 8.7 % — ABNORMAL HIGH (ref 4.8–5.6)

## 2019-03-22 LAB — TSH: TSH: 1.38 u[IU]/mL (ref 0.450–4.500)

## 2019-03-22 LAB — MICROALBUMIN, URINE: Microalbumin, Urine: 36.7 ug/mL

## 2019-03-22 LAB — T4, FREE: Free T4: 1.09 ng/dL (ref 0.82–1.77)

## 2019-03-22 LAB — VITAMIN D 25 HYDROXY (VIT D DEFICIENCY, FRACTURES): Vit D, 25-Hydroxy: 32.4 ng/mL (ref 30.0–100.0)

## 2019-03-30 ENCOUNTER — Other Ambulatory Visit: Payer: Self-pay | Admitting: "Endocrinology

## 2019-03-30 ENCOUNTER — Other Ambulatory Visit: Payer: Self-pay

## 2019-03-30 ENCOUNTER — Ambulatory Visit (INDEPENDENT_AMBULATORY_CARE_PROVIDER_SITE_OTHER): Payer: Medicare Other | Admitting: "Endocrinology

## 2019-03-30 ENCOUNTER — Encounter: Payer: Self-pay | Admitting: "Endocrinology

## 2019-03-30 VITALS — BP 140/84 | HR 80 | Ht 76.0 in | Wt 378.0 lb

## 2019-03-30 DIAGNOSIS — E118 Type 2 diabetes mellitus with unspecified complications: Secondary | ICD-10-CM

## 2019-03-30 DIAGNOSIS — I10 Essential (primary) hypertension: Secondary | ICD-10-CM | POA: Diagnosis not present

## 2019-03-30 DIAGNOSIS — E1165 Type 2 diabetes mellitus with hyperglycemia: Secondary | ICD-10-CM

## 2019-03-30 DIAGNOSIS — IMO0002 Reserved for concepts with insufficient information to code with codable children: Secondary | ICD-10-CM

## 2019-03-30 DIAGNOSIS — E782 Mixed hyperlipidemia: Secondary | ICD-10-CM

## 2019-03-30 DIAGNOSIS — Z794 Long term (current) use of insulin: Secondary | ICD-10-CM | POA: Diagnosis not present

## 2019-03-30 MED ORDER — HUMALOG KWIKPEN 200 UNIT/ML ~~LOC~~ SOPN: 30.0000 [IU] | PEN_INJECTOR | Freq: Three times a day (TID) | SUBCUTANEOUS | 1 refills | Status: DC

## 2019-03-30 NOTE — Progress Notes (Signed)
Endocrinology follow-up note   Subjective:    Patient ID: Kenneth Patrick, male    DOB: Apr 29, 1951, PCP [name], MD   Past Medical History:  Diagnosis Date  . Cancer ([place])    basal cell   . COPD (chronic obstructive pulmonary disease) (HCC)    uses inhalers  . Depression   . Diabetic neuropathy ([place])   . DM2 (diabetes mellitus, type 2) (HCC)    insulin dependent  . Dyspnea    with exertion  . Hypercholesterolemia   . Low back pain    no meds  . Respiratory infection   . Sleep apnea    uses CPAP   Past Surgical History:  Procedure Laterality Date  . APPLICATION OF A-CELL OF HEAD/NECK Left 11/01/2018   Procedure: APPLICATION OF A-CELL TO SCALP WOUND;  Surgeon: Wallace Going, DO;  Location: [facility];  Service: Plastics;  Laterality: Left;  . CHOLECYSTECTOMY    . DEBRIDEMENT AND CLOSURE WOUND Left 11/01/2018   Procedure: Debridement scalp wound;  Surgeon: Wallace Going, DO;  Location: [facility];  Service: Plastics;  Laterality: Left;  . left eye enucleation following trauma     Social History   Socioeconomic History  . Marital status: Married    Spouse name: Not on file  . Number of children: Not on file  . Years of education: Not on file  . Highest education level: Not on file  Occupational History  . Occupation: retired Engineer, structural  Social Needs  . Financial resource strain: Not on file  . Food insecurity    Worry: Not on file    Inability: Not on file  . Transportation needs    Medical: Not on file    Non-medical: Not on file  Tobacco Use  . Smoking status: Former Smoker    Packs/day: 1.50    Years: 20.00    Pack years: 30.00    Quit date: 10/05/2004    Years since quitting: 14.4  . Smokeless tobacco: Never Used  Substance and Sexual Activity  . Alcohol use: No    Alcohol/week: 0.0 standard drinks  . Drug use: No    Comment: stopped smoking marijuana in 2006  . Sexual activity: Not on file   Lifestyle  . Physical activity    Days per week: Not on file    Minutes per session: Not on file  . Stress: Not on file  Relationships  . Social Herbalist on phone: Not on file    Gets together: Not on file    Attends religious service: Not on file    Active member of club or organization: Not on file    Attends meetings of clubs or organizations: Not on file    Relationship status: Not on file  Other Topics Concern  . Not on file  Social History Narrative  . Not on file   Outpatient Encounter Medications as of 03/30/2019  Medication Sig  . albuterol (VENTOLIN HFA) 108 (90 BASE) MCG/ACT inhaler Inhale 2 puffs into the lungs every 6 (six) hours as needed for wheezing or shortness of breath.   [name] atorvastatin (LIPITOR) 80 MG tablet Take 80 mg by mouth at bedtime.   . Continuous Blood Gluc Sensor (FREESTYLE LIBRE SENSOR SYSTEM) MISC Use one sensor every 10 days.  . Fluticasone-Umeclidin-Vilant (TRELEGY ELLIPTA) 100-62.5-25 MCG/INH AEPB Inhale 1 puff into the lungs daily.  . furosemide (LASIX) 40 MG tablet Take 40 mg  by mouth daily as needed for fluid.   [name] gabapentin (NEURONTIN) 300 MG capsule Take 300 mg by mouth 2 (two) times daily.   . Insulin Detemir (LEVEMIR FLEXTOUCH) 100 UNIT/ML Pen Inject 80 Units into the skin at bedtime.  . Insulin Lispro (HUMALOG KWIKPEN) 200 UNIT/ML SOPN Inject 30-36 Units into the skin 3 (three) times daily before meals.  . liraglutide (VICTOZA) 18 MG/3ML SOPN INJECT 1.8 SUBCUTANEOUSLY ONCE DAILY  . liraglutide (VICTOZA) 18 MG/3ML SOPN INJECT 1.8MG  SUBCUTANEOUSLY DAILY  . oxymetazoline (AFRIN) 0.05 % nasal spray Place 1-2 sprays into both nostrils 2 (two) times daily as needed for congestion.   . potassium chloride SA (KLOR-CON M20) 20 MEQ tablet Take 1 tablet (20 mEq total) by mouth daily.  . sertraline (ZOLOFT) 100 MG tablet Take 200 mg by mouth daily.   . [DISCONTINUED] HUMALOG KWIKPEN 200 UNIT/ML SOPN INJECT 30 TO 36 UNITS SUBCUTANEOUSLY THREE  TIMES DAILY BEFORE MEAL(S)   No facility-administered encounter medications on file as of 03/30/2019.    ALLERGIES: Allergies  Allergen Reactions  . Penicillins Swelling and Rash    Did it involve swelling of the face/tongue/throat, SOB, or low BP? Yes Did it involve sudden or severe rash/hives, skin peeling, or any reaction on the inside of your mouth or nose? No Did you need to seek medical attention at a hospital or doctor's office? Yes When did it last happen?Childhood allergy If all above answers are "NO", may proceed with cephalosporin use.     VACCINATION STATUS: Immunization History  Administered Date(s) Administered  . Influenza Split 10/23/2011  . Influenza Whole 09/04/2010  . Influenza,inj,Quad PF,6+ Mos 06/08/2013, 09/03/2014, 07/31/2015, 06/22/2016, 06/29/2018  . Pneumococcal Conjugate-13 03/05/2015  . Pneumococcal Polysaccharide-23 05/14/2011, 11/27/2013    Diabetes He presents for his follow-up diabetic visit. He has type 2 diabetes mellitus. Onset time: He was diagnosed at approximate age of 15 years. His disease course has been worsening. There are no hypoglycemic associated symptoms. Pertinent negatives for hypoglycemia include no confusion, headaches, pallor or seizures. Associated symptoms include polydipsia and polyuria. Pertinent negatives for diabetes include no chest pain, no fatigue, no polyphagia and no weakness. There are no hypoglycemic complications. Symptoms are worsening. There are no diabetic complications. Risk factors for coronary artery disease include diabetes mellitus, dyslipidemia, hypertension, male sex, obesity, sedentary lifestyle and tobacco exposure. Current diabetic treatment includes insulin injections. He is compliant with treatment most of the time. His weight is fluctuating minimally. He is following a generally unhealthy diet. When asked about meal planning, he reported none. He has had a previous visit with a dietitian. He never  participates in exercise. His home blood glucose trend is decreasing steadily. His breakfast blood glucose range is generally 180-200 mg/dl. His lunch blood glucose range is generally 180-200 mg/dl. His dinner blood glucose range is generally 180-200 mg/dl. His bedtime blood glucose range is generally 180-200 mg/dl. His overall blood glucose range is 180-200 mg/dl. Eye exam is current.  Hyperlipidemia This is a chronic problem. The current episode started more than 1 year ago. The problem is uncontrolled. Exacerbating diseases include diabetes and obesity. Pertinent negatives include no chest pain, myalgias or shortness of breath. Current antihyperlipidemic treatment includes statins. Compliance problems include adherence to diet and adherence to exercise.  Risk factors for coronary artery disease include dyslipidemia, diabetes mellitus, obesity, male sex and a sedentary lifestyle.  Hypertension This is a chronic problem. The current episode started more than 1 year ago. The problem is controlled. Pertinent negatives include  no chest pain, headaches, neck pain, palpitations or shortness of breath. Risk factors for coronary artery disease include diabetes mellitus, dyslipidemia, male gender, obesity, smoking/tobacco exposure and sedentary lifestyle. Compliance problems include diet and exercise.      Review of Systems  Constitutional: Negative for chills, fatigue, fever and unexpected weight change.  HENT: Negative for dental problem, mouth sores and trouble swallowing.   Eyes: Negative for visual disturbance.  Respiratory: Negative for cough, choking, chest tightness, shortness of breath and wheezing.   Cardiovascular: Negative for chest pain, palpitations and leg swelling.  Gastrointestinal: Negative for abdominal distention, abdominal pain, constipation, diarrhea, nausea and vomiting.  Endocrine: Positive for polydipsia and polyuria. Negative for polyphagia.  Genitourinary: Negative for dysuria,  flank pain, hematuria and urgency.  Musculoskeletal: Negative for back pain, gait problem, myalgias and neck pain.  Skin: Negative for pallor, rash and wound.  Neurological: Negative for seizures, syncope, weakness, numbness and headaches.  Psychiatric/Behavioral: Negative.  Negative for confusion and dysphoric mood.    Objective:    BP 140/84   Pulse 80   Ht 6\' 4"  (1.93 m)   Wt (!) 378 lb (171.5 kg)   BMI 46.01 kg/m   Wt Readings from Last 3 Encounters:  03/30/19 (!) 378 lb (171.5 kg)  01/31/19 (!) 378 lb (171.5 kg)  12/27/18 (!) 383 lb (173.7 kg)    Physical Exam  Constitutional: He is oriented to person, place, and time. He appears well-developed. He is cooperative. No distress.  HENT:  Head: Normocephalic and atraumatic.  Eyes:  He wears dark heavy glasses due to reported prior injury to eyes.  Neck: Normal range of motion. Neck supple. No tracheal deviation present. No thyromegaly present.  Cardiovascular: Normal rate, S1 normal and S2 normal. Exam reveals no gallop.  No murmur heard. Pulses:      Dorsalis pedis pulses are 1+ on the right side and 1+ on the left side.       Posterior tibial pulses are 1+ on the right side and 1+ on the left side.  Pulmonary/Chest: Effort normal. No respiratory distress. He has no wheezes.  Abdominal: He exhibits no distension. There is no abdominal tenderness. There is no guarding and no CVA tenderness.  Musculoskeletal:        General: No edema.     Right shoulder: He exhibits no swelling and no deformity.  Neurological: He is alert and oriented to person, place, and time. He has normal strength. No cranial nerve deficit or sensory deficit. Gait normal.  Skin: Skin is warm and dry. No rash noted. No cyanosis. Nails show no clubbing.  Psychiatric: He has a normal mood and affect. His speech is normal. Judgment normal. Cognition and memory are normal.    Diabetic Labs (most recent): Lab Results  Component Value Date   HGBA1C 8.7 (H)  03/21/2019   HGBA1C 8.3 (H) 11/21/2018   HGBA1C 9.6 (H) 08/17/2018   Recent Results (from the past 2160 hour(s))  Comprehensive metabolic panel     Status: Abnormal   Collection Time: 03/21/19  3:05 PM  Result Value Ref Range   Glucose 188 (H) 65 - 99 mg/dL   BUN 22 8 - 27 mg/dL   Creatinine, Ser 1.32 (H) 0.76 - 1.27 mg/dL   GFR calc non Af Amer 55 (L) >59 mL/min/1.73   GFR calc Af Amer 64 >59 mL/min/1.73   BUN/Creatinine Ratio 17 10 - 24   Sodium 145 (H) 134 - 144 mmol/L   Potassium 5.5 (H)  3.5 - 5.2 mmol/L   Chloride 103 96 - 106 mmol/L   CO2 22 20 - 29 mmol/L   Calcium 10.1 8.6 - 10.2 mg/dL   Total Protein 6.7 6.0 - 8.5 g/dL   Albumin 4.3 3.8 - 4.8 g/dL   Globulin, Total 2.4 1.5 - 4.5 g/dL   Albumin/Globulin Ratio 1.8 1.2 - 2.2   Bilirubin Total 1.0 0.0 - 1.2 mg/dL   Alkaline Phosphatase 129 (H) 39 - 117 IU/L   AST 57 (H) 0 - 40 IU/L   ALT 42 0 - 44 IU/L  Hgb A1c w/o eAG     Status: Abnormal   Collection Time: 03/21/19  3:05 PM  Result Value Ref Range   Hgb A1c MFr Bld 8.7 (H) 4.8 - 5.6 %    Comment:          Prediabetes: 5.7 - 6.4          Diabetes: >6.4          Glycemic control for adults with diabetes: <7.0   T4, free     Status: None   Collection Time: 03/21/19  3:05 PM  Result Value Ref Range   Free T4 1.09 0.82 - 1.77 ng/dL  TSH     Status: None   Collection Time: 03/21/19  3:05 PM  Result Value Ref Range   TSH 1.380 0.450 - 4.500 uIU/mL  VITAMIN D 25 Hydroxy (Vit-D Deficiency, Fractures)     Status: None   Collection Time: 03/21/19  3:05 PM  Result Value Ref Range   Vit D, 25-Hydroxy 32.4 30.0 - 100.0 ng/mL    Comment: Vitamin D deficiency has been defined by the [place] and an Endocrine Society practice guideline as a level of serum 25-OH vitamin D less than 20 ng/mL (1,2). The Endocrine Society went on to further define vitamin D insufficiency as a level between 21 and 29 ng/mL (2). 1. IOM (Institute of Medicine). 2010. Dietary  reference    intakes for calcium and D. [place]: The    [employer]. 2. Holick MF, Binkley [place], Bischoff-Ferrari HA, et al.    Evaluation, treatment, and prevention of vitamin D    deficiency: an Endocrine Society clinical practice    guideline. JCEM. 2011 Jul; 96(7):1911-30.   Microalbumin, urine     Status: None   Collection Time: 03/21/19  3:05 PM  Result Value Ref Range   Microalbumin, Urine 36.7 Not Estab. ug/mL   Lipid Panel     Component Value Date/Time   CHOL 165 07/12/2017 1324   TRIG 100 07/12/2017 1324   HDL 46 07/12/2017 1324   CHOLHDL 4.0 06/26/2016 1403   VLDL 22 06/26/2016 1403   LDLCALC 99 07/12/2017 1324     Assessment & Plan:   1. Uncontrolled type 2 diabetes mellitus with complication, with long-term current use of insulin ([place])  -His diabetes is  complicated by morbid obesity and sleep apnea and patient remains at a high risk for more acute and chronic complications of diabetes which include CAD, CVA, CKD, retinopathy, and neuropathy. These are all discussed in detail with the patient.  Real came with still significantly above target glycemic profile and A1c of 8.7% slightly increasing from 8.3% during his last visit.    -  Glucose logs and insulin administration records pertaining to this visit,  to be scanned into patient's records.  Recent labs reviewed.   - I have re-counseled the patient on diet management and weight loss  by adopting a carbohydrate restricted / protein rich  Diet.  - he  admits there is a room for improvement in his diet and drink choices. -  Suggestion is made for him to avoid simple carbohydrates  from his diet including Cakes, Sweet Desserts / Pastries, Ice Cream, Soda (diet and regular), Sweet Tea, Candies, Chips, Cookies, Sweet Pastries,  Store Bought Juices, Alcohol in Excess of  1-2 drinks a day, Artificial Sweeteners, Coffee Creamer, and "Sugar-free" Products. This will help patient to have stable blood  glucose profile and potentially avoid unintended weight gain.   - Patient is advised to stick to a routine mealtimes to eat 3 meals  a day and avoid unnecessary snacks ( to snack only to correct hypoglycemia).   - I have approached patient with the following individualized plan to manage diabetes and patient agrees.  -  His CGM snapshot analysis shows improved glycemic profile, supper time hyperglycemia.     -No significant documented or reported hypoglycemia.  He will continue to require intensive treatment with basal/bolus insulin in order for him to achieve and maintain control of diabetes to target. -He is advised to increase Levemir to  80 units nightly, continue Humalog 30 units 3 times daily before meals  for pre-meal BG readings of 90-150mg /dl, plus patient specific correction dose of rapid acting insulin for unexpected hyperglycemia above 150mg /dl, associated with strict documentation of blood glucose 4 times a day-before meals and at bedtime.  -He is advised to shift some of his carbs from supper to lunch for proper distribution of his caloric exposure across the day.  -Adjustment parameters for hypo and hyperglycemia were given in a written document to patient. -Patient is encouraged to call clinic for blood glucose levels less than 70 or above 300 mg /dl. -He he is advised to continue Victoza 1.8 mg subcutaneously daily.    -Due to moderate renal insufficiency, I advised him to stay off of metformin for now.  -His potassium is increasing to 5.5, advised to use his Lasix (he has significant bilateral lower extremity edema) without potassium supplement for a week.  - Patient specific target  for A1c; LDL, HDL, Triglycerides, and  Waist Circumference were discussed in detail.  2) BP/HTN : His blood pressure is controlled near target.    He is advised to continue his current blood pressure medications, will be considered for low-dose lisinopril next visit.   3) Lipids/HPL: His  recent lipid panel showed uncontrolled LDL at 99.  He is advised to continue atorvastatin 80 mg p.o. nightly.    4)  Weight/Diet: He lost 9 pounds since last visit.  He still admits to dietary discretion.   CDE consult in progress, exercise, and carbohydrates information provided.  5) Chronic Care/Health Maintenance:  -Patient is on ACEI/ARB and Statin medications and encouraged to continue to follow up with Ophthalmology, Podiatrist at least yearly or according to recommendations, and advised to  stay away from smoking. I have recommended yearly flu vaccine and pneumonia vaccination at least every 5 years; moderate intensity exercise for up to 150 minutes weekly; and  sleep for at least 7 hours a day.  - I advised patient to maintain close follow up with [name], MD for primary care needs.  - Time spent with the patient: 25 min, of which >50% was spent in reviewing his blood glucose logs , discussing his hypoglycemia and hyperglycemia episodes, reviewing his current and  previous labs / studies and medications  doses and developing  a plan to avoid hypoglycemia and hyperglycemia. Please refer to Patient Instructions for Blood Glucose Monitoring and Insulin/Medications Dosing Guide"  in media tab for additional information. Please  also refer to " Patient Self Inventory" in the Media  tab for reviewed elements of pertinent patient history.  Kenneth Patrick participated in the discussions, expressed understanding, and voiced agreement with the above plans.  All questions were answered to his satisfaction. he is encouraged to contact clinic should he have any questions or concerns prior to his return visit.   Follow up plan: -Return in about 4 months (around 07/30/2019) for Follow up with Pre-visit Labs, Meter, and Logs.  Glade Lloyd, MD Phone: [phone]  Fax: [phone]   -  This note was partially dictated with voice recognition software. Similar sounding words can be transcribed  inadequately or may not  be corrected upon review.  03/30/2019, 4:55 PM

## 2019-03-30 NOTE — Patient Instructions (Signed)

## 2019-04-06 DIAGNOSIS — H31091 Other chorioretinal scars, right eye: Secondary | ICD-10-CM | POA: Diagnosis not present

## 2019-04-06 DIAGNOSIS — E113211 Type 2 diabetes mellitus with mild nonproliferative diabetic retinopathy with macular edema, right eye: Secondary | ICD-10-CM | POA: Diagnosis not present

## 2019-04-14 DIAGNOSIS — H25811 Combined forms of age-related cataract, right eye: Secondary | ICD-10-CM | POA: Diagnosis not present

## 2019-04-18 ENCOUNTER — Encounter (HOSPITAL_COMMUNITY): Payer: Self-pay

## 2019-04-18 ENCOUNTER — Other Ambulatory Visit: Payer: Self-pay

## 2019-04-18 ENCOUNTER — Encounter (HOSPITAL_COMMUNITY)
Admission: RE | Admit: 2019-04-18 | Discharge: 2019-04-18 | Disposition: A | Discharge status: Home / Self Care | Payer: Medicare Other | Source: Ambulatory Visit | Attending: Ophthalmology | Admitting: Ophthalmology

## 2019-04-18 ENCOUNTER — Other Ambulatory Visit (HOSPITAL_COMMUNITY)
Admission: RE | Admit: 2019-04-18 | Discharge: 2019-04-18 | Disposition: A | Discharge status: Home / Self Care | Payer: Medicare Other | Source: Ambulatory Visit | Attending: Ophthalmology | Admitting: Ophthalmology

## 2019-04-18 DIAGNOSIS — E1136 Type 2 diabetes mellitus with diabetic cataract: Secondary | ICD-10-CM | POA: Diagnosis not present

## 2019-04-18 DIAGNOSIS — H2511 Age-related nuclear cataract, right eye: Secondary | ICD-10-CM | POA: Insufficient documentation

## 2019-04-18 DIAGNOSIS — I10 Essential (primary) hypertension: Secondary | ICD-10-CM | POA: Diagnosis not present

## 2019-04-18 DIAGNOSIS — Z87891 Personal history of nicotine dependence: Secondary | ICD-10-CM | POA: Diagnosis not present

## 2019-04-18 DIAGNOSIS — J449 Chronic obstructive pulmonary disease, unspecified: Secondary | ICD-10-CM | POA: Diagnosis not present

## 2019-04-18 DIAGNOSIS — G4730 Sleep apnea, unspecified: Secondary | ICD-10-CM | POA: Diagnosis not present

## 2019-04-18 DIAGNOSIS — Z85828 Personal history of other malignant neoplasm of skin: Secondary | ICD-10-CM | POA: Diagnosis not present

## 2019-04-18 DIAGNOSIS — K219 Gastro-esophageal reflux disease without esophagitis: Secondary | ICD-10-CM | POA: Diagnosis not present

## 2019-04-18 DIAGNOSIS — Z01812 Encounter for preprocedural laboratory examination: Secondary | ICD-10-CM | POA: Insufficient documentation

## 2019-04-18 DIAGNOSIS — E1140 Type 2 diabetes mellitus with diabetic neuropathy, unspecified: Secondary | ICD-10-CM | POA: Diagnosis not present

## 2019-04-18 DIAGNOSIS — Z1159 Encounter for screening for other viral diseases: Secondary | ICD-10-CM | POA: Insufficient documentation

## 2019-04-18 DIAGNOSIS — H259 Unspecified age-related cataract: Secondary | ICD-10-CM | POA: Diagnosis not present

## 2019-04-18 DIAGNOSIS — Z880 Allergy status to penicillin: Secondary | ICD-10-CM | POA: Diagnosis not present

## 2019-04-18 DIAGNOSIS — F419 Anxiety disorder, unspecified: Secondary | ICD-10-CM | POA: Diagnosis not present

## 2019-04-18 LAB — BASIC METABOLIC PANEL
Anion gap: 10 (ref 5–15)
BUN: 16 mg/dL (ref 8–23)
CO2: 23 mmol/L (ref 22–32)
Calcium: 8.6 mg/dL — ABNORMAL LOW (ref 8.9–10.3)
Chloride: 106 mmol/L (ref 98–111)
Creatinine, Ser: 1.48 mg/dL — ABNORMAL HIGH (ref 0.61–1.24)
GFR calc Af Amer: 56 mL/min — ABNORMAL LOW (ref >60)
GFR calc non Af Amer: 48 mL/min — ABNORMAL LOW (ref >60)
Glucose, Bld: 212 mg/dL — ABNORMAL HIGH (ref 70–99)
Potassium: 3.8 mmol/L (ref 3.5–5.1)
Sodium: 139 mmol/L (ref 135–145)

## 2019-04-18 LAB — HEMOGLOBIN A1C
Hgb A1c MFr Bld: 8.4 % — ABNORMAL HIGH (ref 4.8–5.6)
Mean Plasma Glucose: 194.38 mg/dL

## 2019-04-18 NOTE — Patient Instructions (Signed)
Kenneth Patrick  04/18/2019     @PREFPERIOPPHARMACY @   Your procedure is scheduled on 04/21/2019.  Report to [facility] at 6:45 A.M.  Call this number if you have problems the morning of surgery:  [phone]   Remember:  Do not eat or drink after midnight.   Take these medicines the morning of surgery with A SIP OF WATER Gabapentin and Zoloft    Do not wear jewelry, make-up or nail polish.  Do not wear lotions, powders, or perfumes, or deodorant.  Do not shave 48 hours prior to surgery.  Men may shave face and neck.  Do not bring valuables to the hospital.  [facility] is not responsible for any belongings or valuables.  Contacts, dentures or bridgework may not be worn into surgery.  Leave your suitcase in the car.  After surgery it may be brought to your room.  For patients admitted to the hospital, discharge time will be determined by your treatment team.  Patients discharged the day of surgery will not be allowed to drive home.   Name and phone number of your driver:   family Special instructions:  N//a  Please read over the following fact sheets that you were given. Care and Recovery After Surgery    Cataract Surgery Cataract surgery is a procedure to remove a cloudy lens (cataract) in the eye. The lens focuses light inside the eye. When a lens becomes cloudy, your vision is affected. Another lens (intraocular lens or IOL) is usually inserted to replace the cloudy lens and to properly focus light in the eye. Cataract surgery is usually recommended when the cataract is causing problems with daily functioning, such as reading, watching television, or driving. Tell a health care provider about:  Any allergies you have.  All medicines you are taking, including vitamins, herbs, eye drops, creams, and over-the-counter medicines.  Any problems you or family members have had with anesthetic medicines.  Any blood disorders you have.  Any surgeries you have had, especially  eye surgeries that include refractive surgery, such as PRK and LASIK.  Any medical conditions you have.  Whether you are pregnant or may be pregnant. What are the risks? Generally, this is a safe procedure. However, problems may occur, including:  Infection.  Bleeding.  High pressure in the eye (glaucoma).  Detachment of the retina.  Corneal swelling.  Allergic reactions to medicines.  Damage to other structures or organs.  Inflammation of the eye.  Clouding of the part of your eye that holds an IOL in place (after-cataract). This is common and can be treated at a later date with laser surgery.  An IOL moving out of position (rare).  Loss of vision (rare). What happens before the procedure? Staying hydrated Follow instructions from your health care provider about hydration, which may include:  Up to 2 to 6 hours before the procedure - you may continue to drink clear liquids, such as water, clear fruit juice, black coffee, and plain tea. Exact instructions will be given by your health care provider.  Eating and drinking restrictions Follow instructions from your health care provider about eating and drinking, which may include:  8 hours before the procedure - stop eating heavy meals or foods, such as meat, fried foods, or fatty foods.  6-8 hours before the procedure - stop eating light meals or foods, such as toast or cereal.  6-8 hours before the procedure - stop drinking milk or drinks that contain milk.  2-6 hours  before the procedure - stop drinking clear liquids. Medicines Ask your health care provider about:  Changing or stopping your regular medicines. This is especially important if you are taking diabetes medicines or blood thinners.  Taking medicines such as aspirin and ibuprofen. These medicines can thin your blood. Do not take these medicines unless your health care provider tells you to take them.  Taking over-the-counter medicines, vitamins, herbs, and  supplements. General instructions  Do not put contact lenses in either eye on the day of your surgery.  Plan to have someone take you home from the hospital or clinic.  If you will be going home right after the procedure, plan to have someone with you for 24 hours.  Ask your health care provider what steps will be taken to help prevent infection. These may include: ? Washing skin with a germ-killing soap. ? Taking antibiotic medicine. What happens during the procedure?      An IV may be inserted into one of your veins.  You will be given one or both of the following: ? A medicine to help you relax (sedative). ? A medicine to numb the area (local anesthetic). This may be numbing eye drops or an injection that is given behind the eye.  A small cut (incision) will be made to the edge of the clear surface that covers the front of the eye (cornea).  A small probe will be inserted into the eye. This device gives off ultrasound waves that soften and break up the cloudy center of the lens. This makes it easier for the cataract to be removed.  The cataract will be removed by suction.  An IOL may be implanted.  Part of the capsule that surrounds the lens will be left in the eye to support the IOL.  Your surgeon may use stitches (sutures) to close the incision. The procedure may vary among health care providers and hospitals. What happens after the procedure?  Your blood pressure, heart rate, breathing rate, and blood oxygen level will be monitored until you leave the hospital or clinic.  You may be given a protective shield to wear over your eye.  You may be given medicines to relieve discomfort and swelling. Some medicines may be in the form of eye drops.  Do not drive for 24 hours if you were given a sedative during your procedure. Summary  Cataract surgery is a procedure to remove a cloudy lens (cataract) in the eye.  This is a safe procedure. However, problems may occur,  including infection, bleeding, glaucoma, inflammation, and loss of vision.  Follow your health care provider's instructions about when to stop eating and drinking and whether to change or stop certain medicines.  After the procedure, you may be given medicines or an eye shield to wear over your eye.  Do not drive for 24 hours if you were given a sedative during your procedure. This information is not intended to replace advice given to you by your health care provider. Make sure you discuss any questions you have with your health care provider. Document Released: 09/10/2011 Document Revised: 03/21/2018 Document Reviewed: 03/21/2018 Elsevier Patient Education  2020 [employer].

## 2019-04-19 LAB — SARS CORONAVIRUS 2 (TAT 6-24 HRS): SARS Coronavirus 2: NEGATIVE

## 2019-04-21 ENCOUNTER — Encounter (HOSPITAL_COMMUNITY)
Admission: RE | Disposition: A | Discharge status: Home / Self Care | Payer: Self-pay | Source: Home / Self Care | Attending: Ophthalmology

## 2019-04-21 ENCOUNTER — Ambulatory Visit (HOSPITAL_COMMUNITY): Payer: Medicare Other | Admitting: Anesthesiology

## 2019-04-21 ENCOUNTER — Ambulatory Visit (HOSPITAL_COMMUNITY)
Admission: RE | Admit: 2019-04-21 | Discharge: 2019-04-21 | Disposition: A | Discharge status: Home / Self Care | Payer: Medicare Other | Attending: Ophthalmology | Admitting: Ophthalmology

## 2019-04-21 ENCOUNTER — Encounter (HOSPITAL_COMMUNITY): Payer: Self-pay

## 2019-04-21 ENCOUNTER — Other Ambulatory Visit: Payer: Self-pay

## 2019-04-21 DIAGNOSIS — Z880 Allergy status to penicillin: Secondary | ICD-10-CM | POA: Insufficient documentation

## 2019-04-21 DIAGNOSIS — Z85828 Personal history of other malignant neoplasm of skin: Secondary | ICD-10-CM | POA: Insufficient documentation

## 2019-04-21 DIAGNOSIS — Z1159 Encounter for screening for other viral diseases: Secondary | ICD-10-CM | POA: Diagnosis not present

## 2019-04-21 DIAGNOSIS — H259 Unspecified age-related cataract: Secondary | ICD-10-CM | POA: Diagnosis not present

## 2019-04-21 DIAGNOSIS — E1140 Type 2 diabetes mellitus with diabetic neuropathy, unspecified: Secondary | ICD-10-CM | POA: Insufficient documentation

## 2019-04-21 DIAGNOSIS — I10 Essential (primary) hypertension: Secondary | ICD-10-CM | POA: Diagnosis not present

## 2019-04-21 DIAGNOSIS — Z87891 Personal history of nicotine dependence: Secondary | ICD-10-CM | POA: Diagnosis not present

## 2019-04-21 DIAGNOSIS — J449 Chronic obstructive pulmonary disease, unspecified: Secondary | ICD-10-CM | POA: Diagnosis not present

## 2019-04-21 DIAGNOSIS — G4730 Sleep apnea, unspecified: Secondary | ICD-10-CM | POA: Diagnosis not present

## 2019-04-21 DIAGNOSIS — K219 Gastro-esophageal reflux disease without esophagitis: Secondary | ICD-10-CM | POA: Insufficient documentation

## 2019-04-21 DIAGNOSIS — F419 Anxiety disorder, unspecified: Secondary | ICD-10-CM | POA: Insufficient documentation

## 2019-04-21 DIAGNOSIS — E1136 Type 2 diabetes mellitus with diabetic cataract: Secondary | ICD-10-CM | POA: Insufficient documentation

## 2019-04-21 DIAGNOSIS — H25811 Combined forms of age-related cataract, right eye: Secondary | ICD-10-CM | POA: Diagnosis not present

## 2019-04-21 HISTORY — PX: CATARACT EXTRACTION W/PHACO: SHX586

## 2019-04-21 LAB — GLUCOSE, CAPILLARY: Glucose-Capillary: 212 mg/dL — ABNORMAL HIGH (ref 70–99)

## 2019-04-21 SURGERY — PHACOEMULSIFICATION, CATARACT, WITH IOL INSERTION
Anesthesia: Monitor Anesthesia Care | Site: Eye | Laterality: Right

## 2019-04-21 MED ORDER — LIDOCAINE HCL (PF) 1 % IJ SOLN
INTRAOCULAR | Status: DC | PRN
  Administered 2019-04-21: 09:00:00 1 mL via OPHTHALMIC

## 2019-04-21 MED ORDER — LIDOCAINE HCL 3.5 % OP GEL
1.0000 "application " | Freq: Once | OPHTHALMIC | Status: AC
  Administered 2019-04-21: 1 via OPHTHALMIC

## 2019-04-21 MED ORDER — TETRACAINE HCL 0.5 % OP SOLN
1.0000 [drp] | OPHTHALMIC | Status: AC
  Administered 2019-04-21 (×3): 1 [drp] via OPHTHALMIC

## 2019-04-21 MED ORDER — CYCLOPENTOLATE-PHENYLEPHRINE 0.2-1 % OP SOLN
1.0000 [drp] | OPHTHALMIC | Status: AC
  Administered 2019-04-21 (×3): 1 [drp] via OPHTHALMIC

## 2019-04-21 MED ORDER — PROVISC 10 MG/ML IO SOLN
INTRAOCULAR | Status: DC | PRN
  Administered 2019-04-21: 0.85 mL via INTRAOCULAR

## 2019-04-21 MED ORDER — SODIUM HYALURONATE 23 MG/ML IO SOLN
INTRAOCULAR | Status: DC | PRN
  Administered 2019-04-21: 0.6 mL via INTRAOCULAR

## 2019-04-21 MED ORDER — NEOMYCIN-POLYMYXIN-DEXAMETH 3.5-10000-0.1 OP SUSP
OPHTHALMIC | Status: DC | PRN
  Administered 2019-04-21: 1 [drp] via OPHTHALMIC

## 2019-04-21 MED ORDER — EPINEPHRINE PF 1 MG/ML IJ SOLN
INTRAMUSCULAR | Status: AC
  Filled 2019-04-21: qty 2

## 2019-04-21 MED ORDER — POVIDONE-IODINE 5 % OP SOLN
OPHTHALMIC | Status: DC | PRN
  Administered 2019-04-21: 1 via OPHTHALMIC

## 2019-04-21 MED ORDER — PHENYLEPHRINE HCL 2.5 % OP SOLN
1.0000 [drp] | OPHTHALMIC | Status: AC
  Administered 2019-04-21 (×3): 1 [drp] via OPHTHALMIC

## 2019-04-21 MED ORDER — EPINEPHRINE PF 1 MG/ML IJ SOLN
INTRAOCULAR | Status: DC | PRN
  Administered 2019-04-21: 500 mL

## 2019-04-21 MED ORDER — BSS IO SOLN
INTRAOCULAR | Status: DC | PRN
  Administered 2019-04-21: 15 mL

## 2019-04-21 SURGICAL SUPPLY — 13 items

## 2019-04-21 NOTE — Discharge Instructions (Signed)
Please discharge patient when stable, will follow up today with Dr. Camille Dragan at the [facility] office immediately following discharge.  Leave shield in place until visit.  All paperwork with discharge instructions will be given at the office. ° °

## 2019-04-21 NOTE — Anesthesia Preprocedure Evaluation (Signed)
Anesthesia Evaluation  Patient identified by MRN, date of birth, ID band Patient awake    Reviewed: Allergy & Precautions, H&P , NPO status , Patient's Chart, lab work & pertinent test results, reviewed documented beta blocker date and time   Airway Mallampati: III  TM Distance: >3 FB Neck ROM: full    Dental   Pulmonary shortness of breath, sleep apnea , COPD, former smoker,    Pulmonary exam normal        Cardiovascular hypertension, On Medications Normal cardiovascular exam     Neuro/Psych    GI/Hepatic negative GI ROS, Neg liver ROS,   Endo/Other  diabetes, Poorly Controlled, Type 2Morbid obesity  Renal/GU      Musculoskeletal   Abdominal   Peds  Hematology negative hematology ROS (+)   Anesthesia Other Findings   Reproductive/Obstetrics                             Anesthesia Physical Anesthesia Plan  ASA: III  Anesthesia Plan: MAC   Post-op Pain Management:    Induction:   PONV Risk Score and Plan: 1 and TIVA  Airway Management Planned:   Additional Equipment:   Intra-op Plan:   Post-operative Plan:   Informed Consent: I have reviewed the patients History and Physical, chart, labs and discussed the procedure including the risks, benefits and alternatives for the proposed anesthesia with the patient or authorized representative who has indicated his/her understanding and acceptance.       Plan Discussed with:   Anesthesia Plan Comments:         Anesthesia Quick Evaluation

## 2019-04-21 NOTE — Op Note (Signed)
Date of procedure: 04/21/19  Pre-operative diagnosis: Visually significant age-related cataract, Right Eye (H25.811)  Post-operative diagnosis: Visually significant age-related cataract, Right Eye  Procedure: Removal of cataract via phacoemulsification and insertion of intra-ocular lens Wynetta Emery and [employer] PCB00  +20.5D into the capsular bag of the Right Eye  Attending surgeon: Gerda Diss. Leotha Voeltz, MD, MA  Anesthesia: MAC, Topical Akten  Complications: None  Estimated Blood Loss: <71m (minimal)  Specimens: None  Implants: As above  Indications:  Visually significant age-related cataract, Right Eye  Procedure:  The patient was seen and identified in the pre-operative area. The operative eye was identified and dilated.  The operative eye was marked.  Topical anesthesia was administered to the operative eye.     The patient was then to the operative suite and placed in the supine position.  A timeout was performed confirming the patient, procedure to be performed, and all other relevant information.   The patient's face was prepped and draped in the usual fashion for intra-ocular surgery.  A lid speculum was placed into the operative eye and the surgical microscope moved into place and focused.  A superotemporal paracentesis was created using a 20 gauge paracentesis blade.  Shugarcaine was injected into the anterior chamber.  Viscoelastic was injected into the anterior chamber.  A temporal clear-corneal main wound incision was created using a 2.438mmicrokeratome.  A continuous curvilinear capsulorrhexis was initiated using an irrigating cystitome and completed using capsulorrhexis forceps.  Hydrodissection and hydrodeliniation were performed.  Viscoelastic was injected into the anterior chamber.  A phacoemulsification handpiece and a chopper as a second instrument were used to remove the nucleus and epinucleus. The irrigation/aspiration handpiece was used to remove any remaining cortical  material.   The capsular bag was reinflated with viscoelastic, checked, and found to be intact.  The intraocular lens was inserted into the capsular bag and dialed into place using a Kuglen hook.  The irrigation/aspiration handpiece was used to remove any remaining viscoelastic.  The clear corneal wound and paracentesis wounds were then hydrated and checked with Weck-Cels to be watertight.  The lid-speculum and drape was removed, and the patient's face was cleaned with a wet and dry 4x4.  Maxitrol was instilled in the eye before a clear shield was taped over the eye. The patient was taken to the post-operative care unit in good condition, having tolerated the procedure well.  Post-Op Instructions: [facility] and will receive all other orders and instructions.

## 2019-04-21 NOTE — Anesthesia Postprocedure Evaluation (Signed)
Anesthesia Post Note  Patient: Kenneth Patrick  Procedure(s) Performed: CATARACT EXTRACTION PHACO AND INTRAOCULAR LENS PLACEMENT RIGHT EYE (Right Eye)  Patient location during evaluation: PACU Anesthesia Type: MAC Level of consciousness: awake and alert Pain management: pain level controlled Vital Signs Assessment: post-procedure vital signs reviewed and stable Respiratory status: spontaneous breathing, nonlabored ventilation, respiratory function stable and patient connected to nasal cannula oxygen Cardiovascular status: stable and blood pressure returned to baseline Postop Assessment: no apparent nausea or vomiting Anesthetic complications: no     Last Vitals:  Vitals:   04/21/19 0722  BP: (!) 168/81  Pulse: 73  Resp: 16  Temp: 36.7 C  SpO2: 96%    Last Pain:  Vitals:   04/21/19 0722  TempSrc: Oral  PainSc: 0-No pain                 Zakai Gonyea

## 2019-04-21 NOTE — Transfer of Care (Signed)
Immediate Anesthesia Transfer of Care Note  Patient: Kenneth Patrick  Procedure(s) Performed: CATARACT EXTRACTION PHACO AND INTRAOCULAR LENS PLACEMENT RIGHT EYE (Right Eye)  Patient Location: PACU  Anesthesia Type:MAC  Level of Consciousness: awake, alert  and oriented  Airway & Oxygen Therapy: Patient Spontanous Breathing  Post-op Assessment: Report given to RN and Post -op Vital signs reviewed and stable  Post vital signs: Reviewed and stable  Last Vitals:  Vitals Value Taken Time  BP    Temp    Pulse    Resp    SpO2      Last Pain:  Vitals:   04/21/19 0722  TempSrc: Oral  PainSc: 0-No pain      Patients Stated Pain Goal: 8 (96/28/36 6294)  Complications: No apparent anesthesia complications

## 2019-04-21 NOTE — H&P (Signed)
The H and P was reviewed and updated. The patient was examined.  No changes were found after exam.  The surgical eye was marked.  

## 2019-04-21 NOTE — Addendum Note (Signed)
Addendum  created 04/21/19 5072 by Rayvon Char, CRNA   Charge Capture section accepted

## 2019-04-24 ENCOUNTER — Encounter (HOSPITAL_COMMUNITY): Payer: Self-pay | Admitting: Ophthalmology

## 2019-04-28 ENCOUNTER — Encounter: Payer: Self-pay | Admitting: Pulmonary Disease

## 2019-04-28 ENCOUNTER — Ambulatory Visit (INDEPENDENT_AMBULATORY_CARE_PROVIDER_SITE_OTHER): Payer: Medicare Other

## 2019-04-28 ENCOUNTER — Ambulatory Visit (INDEPENDENT_AMBULATORY_CARE_PROVIDER_SITE_OTHER): Payer: Medicare Other | Admitting: Pulmonary Disease

## 2019-04-28 ENCOUNTER — Other Ambulatory Visit: Payer: Self-pay

## 2019-04-28 VITALS — BP 122/66 | HR 73 | Temp 98.7°F | Ht 76.0 in | Wt 382.8 lb

## 2019-04-28 DIAGNOSIS — R0609 Other forms of dyspnea: Secondary | ICD-10-CM

## 2019-04-28 DIAGNOSIS — R0602 Shortness of breath: Secondary | ICD-10-CM

## 2019-04-28 DIAGNOSIS — I2781 Cor pulmonale (chronic): Secondary | ICD-10-CM | POA: Diagnosis not present

## 2019-04-28 DIAGNOSIS — J449 Chronic obstructive pulmonary disease, unspecified: Secondary | ICD-10-CM | POA: Diagnosis not present

## 2019-04-28 DIAGNOSIS — R05 Cough: Secondary | ICD-10-CM | POA: Diagnosis not present

## 2019-04-28 DIAGNOSIS — G4733 Obstructive sleep apnea (adult) (pediatric): Secondary | ICD-10-CM | POA: Diagnosis not present

## 2019-04-28 IMAGING — DX CHEST - 2 VIEW
2 series · 2 of 2 positions shown · non-contrast
Comparison: [DATE]

CLINICAL DATA: Cough, COPD, diabetes mellitus, hypertension

EXAM:
CHEST - 2 VIEW

[chest pa]
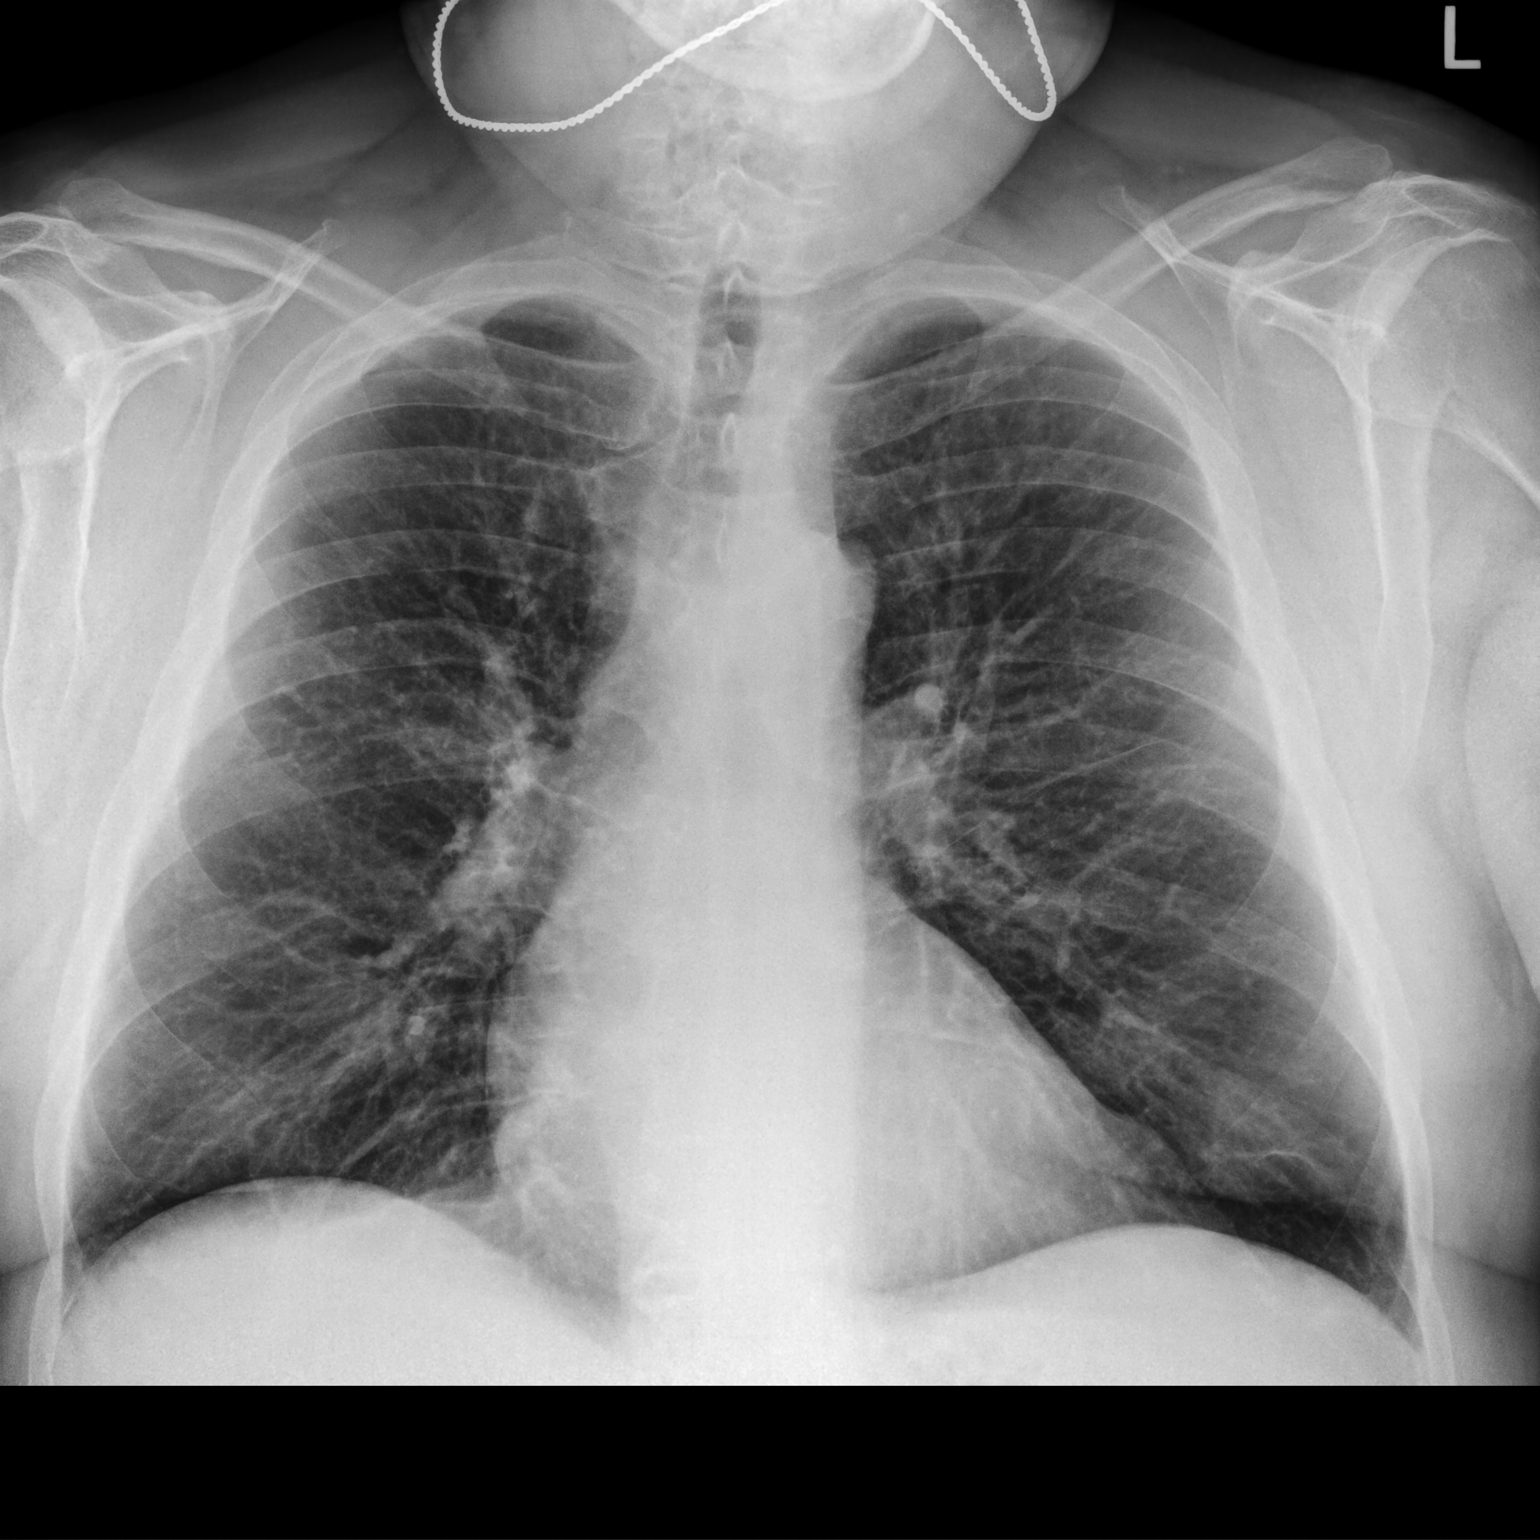

[chest lat]
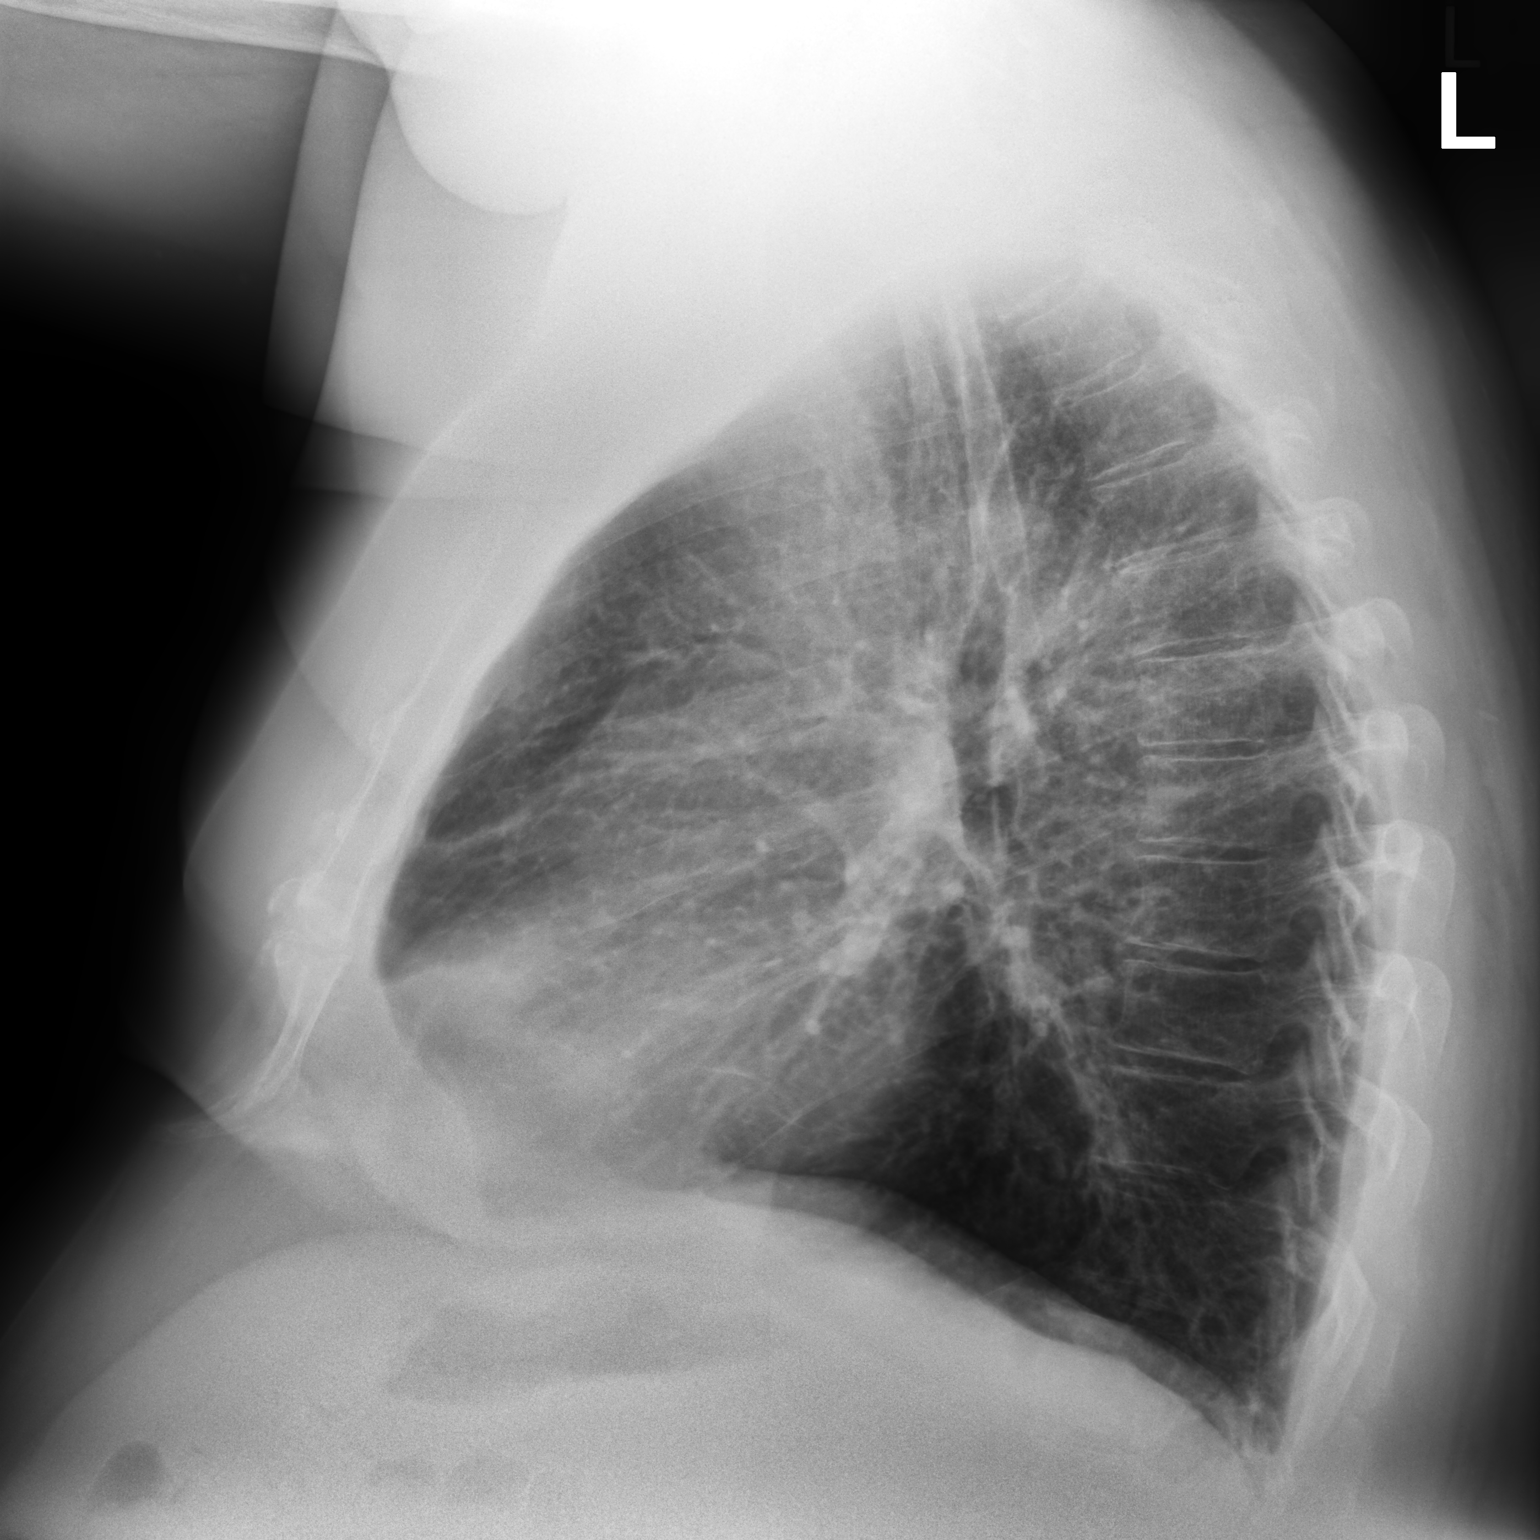

[2 of 2 positions shown; findings below may reference images not displayed]

FINDINGS: Normal heart size, mediastinal contours, and pulmonary vascularity.

Chronic bronchitic changes.

No infiltrate, pleural effusion or pneumothorax.

Bones unremarkable.
IMPRESSION: Mild chronic bronchitic changes without acute abnormalities.

## 2019-04-28 MED ORDER — PREDNISONE 10 MG PO TABS: 10.0000 mg | ORAL_TABLET | Freq: Every day | ORAL | 0 refills | Status: DC

## 2019-04-28 MED ORDER — DOXYCYCLINE HYCLATE 100 MG PO TABS: 100.0000 mg | ORAL_TABLET | Freq: Two times a day (BID) | ORAL | 0 refills | Status: DC

## 2019-04-28 NOTE — Assessment & Plan Note (Signed)
Plan: Continue to work towards increasing physical activity to reduce body weight and BMI

## 2019-04-28 NOTE — Progress Notes (Signed)
Changes of COPD.  This is stable.  No acute abnormality such as pneumonia.  This is good news.  No changes in plan of care.  Keep follow-up with our office  Wyn Quaker, FNP

## 2019-04-28 NOTE — Patient Instructions (Addendum)
Walk today   Lab work today   Chest Xray today   Doxycycline >>> 1 100 mg tablet every 12 hours for 7 days >>>take with food  >>>wear sunscreen   Prednisone 10mg  tablet  >>>Take 2 tablets (20 mg total) daily for the next 5 days >>> Take with food in the morning   Trelegy Ellipta  >>> 1 puff daily in the morning >>>rinse mouth out after use  >>> This inhaler contains 3 medications that help manage her respiratory status, contact our office if you cannot afford this medication or unable to remain on this medication  Only use your albuterol as a rescue medication to be used if you can't catch your breath by resting or doing a relaxed purse lip breathing pattern.  - The less you use it, the better it will work when you need it. - Ok to use up to 2 puffs  every 4 hours if you must but call for immediate appointment if use goes up over your usual need - Don't leave home without it !!  (think of it like the spare tire for your car)    Note your daily symptoms > remember "red flags" for COPD:   >>>Increase in cough >>>increase in sputum production >>>increase in shortness of breath or activity  intolerance.   If you notice these symptoms, please call the office to be seen.     We recommend that you continue using your CPAP daily >>>Keep up the hard work using your device >>> Goal should be wearing this for the entire night that you are sleeping, at least 4 to 6 hours  Remember:  . Do not drive or operate heavy machinery if tired or drowsy.  . Please notify the supply company and office if you are unable to use your device regularly due to missing supplies or machine being broken.  . Work on maintaining a healthy weight and following your recommended nutrition plan  . Maintain proper daily exercise and movement  . Maintaining proper use of your device can also help improve management of other chronic illnesses such as: Blood pressure, blood sugars, and weight management.    BiPAP/ CPAP Cleaning:  >>>Clean weekly, with Dawn soap, and bottle brush.  Set up to air dry.   Patient Education for Fluid Management   Do the following things EVERY DAY:   1. Weigh yourself EVERY morning after you go to the bathroom but before you eat or drink anything. Write this number down in a weight log/diary.    2. Take your medicines as prescribed. If you have concerns about your medications, please call us before you stop taking them.    3. Eat low salt foods-Limit salt (sodium) to 2000 mg per day. This will help prevent your body from holding onto fluid. Read food labels as many processed foods have a lot of sodium, especially canned goods and prepackaged meats. If you would like some assistance choosing low sodium foods, we would be happy to set you up with a nutritionist.   4. Stay as active as you can everyday. Staying active will give you more energy and make your muscles stronger. Start with 5 minutes at a time and work your way up to 30 minutes a day. Break up your activities--do some in the morning and some in the afternoon. Start with 3 days per week and work your way up to 5 days as you can.  If you have chest pain, feel short of breath, dizzy,  or lightheaded, STOP. If you don't feel better after a short rest, call 911. If you do feel better, call the office to let us know you have symptoms with exercise.   5. Limit all fluids for the day to less than 2 liters. Fluid includes all drinks, coffee, juice, ice chips, soup, jello, and all other liquids.   Return in about 4 weeks (around 05/26/2019), or if symptoms worsen or fail to improve, for Follow up with Wyn Quaker FNP-C, Follow up with Tammy Parrett  ANP-BC, Follow up with Dr. Elsworth Soho.   Coronavirus (COVID-19) Are you at risk?  Are you at risk for the Coronavirus (COVID-19)?  To be considered HIGH RISK for Coronavirus (COVID-19), you have to meet the following criteria:  . Traveled to [place], [place], [place], [place]  or [place]; or in the [place] to [place], [place], [place], or [place]; and have fever, cough, and shortness of breath within the last 2 weeks of travel OR . Been in close contact with a person diagnosed with COVID-19 within the last 2 weeks and have fever, cough, and shortness of breath . IF YOU DO NOT MEET THESE CRITERIA, YOU ARE CONSIDERED LOW RISK FOR COVID-19.  What to do if you are HIGH RISK for COVID-19?  [name] If you are having a medical emergency, call 911. . Seek medical care right away. Before you go to a doctor's office, urgent care or emergency department, call ahead and tell them about your recent travel, contact with someone diagnosed with COVID-19, and your symptoms. You should receive instructions from your physician's office regarding next steps of care.  . When you arrive at healthcare provider, tell the healthcare staff immediately you have returned from visiting [place], [place], [place], [place] or [place]; or traveled in the [place] to [place], [place], [place], or [place]; in the last two weeks or you have been in close contact with a person diagnosed with COVID-19 in the last 2 weeks.   . Tell the health care staff about your symptoms: fever, cough and shortness of breath. . After you have been seen by a medical provider, you will be either: o Tested for (COVID-19) and discharged home on quarantine except to seek medical care if symptoms worsen, and asked to  - Stay home and avoid contact with others until you get your results (4-5 days)  - Avoid travel on public transportation if possible (such as bus, train, or airplane) or o Sent to the Emergency Department by EMS for evaluation, COVID-19 testing, and possible admission depending on your condition and test results.  What to do if you are LOW RISK for COVID-19?  Reduce your risk of any infection by using the same precautions used for avoiding the common cold or flu:  [name] Wash your hands often with  soap and warm water for at least 20 seconds.  If soap and water are not readily available, use an alcohol-based hand sanitizer with at least 60% alcohol.  . If coughing or sneezing, cover your mouth and nose by coughing or sneezing into the elbow areas of your shirt or coat, into a tissue or into your sleeve (not your hands). . Avoid shaking hands with others and consider head nods or verbal greetings only. . Avoid touching your eyes, nose, or mouth with unwashed hands.  . Avoid close contact with people who are sick. . Avoid places or events with large numbers of people in one location, like concerts or  sporting events. . Carefully consider travel plans you have or are making. . If you are planning any travel outside or inside the [place], visit the CDC's Travelers' Health webpage for the latest health notices. . If you have some symptoms but not all symptoms, continue to monitor at home and seek medical attention if your symptoms worsen. . If you are having a medical emergency, call 911.   [place] / e-Visit: [email or web address]         [facility]: [facility]: [phone]                   [facility]: [phone]           It is flu season:   >>> Best ways to protect herself from the flu: Receive the yearly flu vaccine, practice good hand hygiene washing with soap and also using hand sanitizer when available, eat a nutritious meals, get adequate rest, hydrate appropriately   Please contact the office if your symptoms worsen or you have concerns that you are not improving.   Thank you for choosing [facility] for your healthcare, and for allowing [place] to partner with you on your healthcare journey. I am thankful to be able to provide care to you today.   Wyn Quaker FNP-C     Low-Sodium Eating Plan Sodium, which  is an element that makes up salt, helps you maintain a healthy balance of fluids in your body. Too much sodium can increase your blood pressure and cause fluid and waste to be held in your body. Your health care provider or dietitian may recommend following this plan if you have high blood pressure (hypertension), kidney disease, liver disease, or heart failure. Eating less sodium can help lower your blood pressure, reduce swelling, and protect your heart, liver, and kidneys. What are tips for following this plan? General guidelines  Most people on this plan should limit their sodium intake to 1,500-2,000 mg (milligrams) of sodium each day. Reading food labels   The Nutrition Facts label lists the amount of sodium in one serving of the food. If you eat more than one serving, you must multiply the listed amount of sodium by the number of servings.  Choose foods with less than 140 mg of sodium per serving.  Avoid foods with 300 mg of sodium or more per serving. Shopping  Look for lower-sodium products, often labeled as "low-sodium" or "no salt added."  Always check the sodium content even if foods are labeled as "unsalted" or "no salt added".  Buy fresh foods. ? Avoid canned foods and premade or frozen meals. ? Avoid canned, cured, or processed meats  Buy breads that have less than 80 mg of sodium per slice. Cooking  Eat more home-cooked food and less restaurant, buffet, and fast food.  Avoid adding salt when cooking. Use salt-free seasonings or herbs instead of table salt or sea salt. Check with your health care provider or pharmacist before using salt substitutes.  Cook with plant-based oils, such as canola, sunflower, or olive oil. Meal planning  When eating at a restaurant, ask that your food be prepared with less salt or no salt, if possible.  Avoid foods that contain MSG (monosodium glutamate). MSG is sometimes added to [place] food, bouillon, and some canned foods. What  foods are recommended? The items listed may not be a complete list. Talk with your dietitian about what  dietary choices are best for you. Grains Low-sodium cereals, including oats, puffed wheat and rice, and shredded wheat. Low-sodium crackers. Unsalted rice. Unsalted pasta. Low-sodium bread. Whole-grain breads and whole-grain pasta. Vegetables Fresh or frozen vegetables. "No salt added" canned vegetables. "No salt added" tomato sauce and paste. Low-sodium or reduced-sodium tomato and vegetable juice. Fruits Fresh, frozen, or canned fruit. Fruit juice. Meats and other protein foods Fresh or frozen (no salt added) meat, poultry, seafood, and fish. Low-sodium canned tuna and salmon. Unsalted nuts. Dried peas, beans, and lentils without added salt. Unsalted canned beans. Eggs. Unsalted nut butters. Dairy Milk. Soy milk. Cheese that is naturally low in sodium, such as ricotta cheese, fresh mozzarella, or Swiss cheese Low-sodium or reduced-sodium cheese. Cream cheese. Yogurt. Fats and oils Unsalted butter. Unsalted margarine with no trans fat. Vegetable oils such as canola or olive oils. Seasonings and other foods Fresh and dried herbs and spices. Salt-free seasonings. Low-sodium mustard and ketchup. Sodium-free salad dressing. Sodium-free light mayonnaise. Fresh or refrigerated horseradish. Lemon juice. Vinegar. Homemade, reduced-sodium, or low-sodium soups. Unsalted popcorn and pretzels. Low-salt or salt-free chips. What foods are not recommended? The items listed may not be a complete list. Talk with your dietitian about what dietary choices are best for you. Grains Instant hot cereals. Bread stuffing, pancake, and biscuit mixes. Croutons. Seasoned rice or pasta mixes. Noodle soup cups. Boxed or frozen macaroni and cheese. Regular salted crackers. Self-rising flour. Vegetables Sauerkraut, pickled vegetables, and relishes. Olives. [place] fries. Onion rings. Regular canned vegetables (not low-sodium  or reduced-sodium). Regular canned tomato sauce and paste (not low-sodium or reduced-sodium). Regular tomato and vegetable juice (not low-sodium or reduced-sodium). Frozen vegetables in sauces. Meats and other protein foods Meat or fish that is salted, canned, smoked, spiced, or pickled. Bacon, ham, sausage, hotdogs, corned beef, chipped beef, packaged lunch meats, salt pork, jerky, pickled herring, anchovies, regular canned tuna, sardines, salted nuts. Dairy Processed cheese and cheese spreads. Cheese curds. Blue cheese. Feta cheese. String cheese. Regular cottage cheese. Buttermilk. Canned milk. Fats and oils Salted butter. Regular margarine. Ghee. Bacon fat. Seasonings and other foods Onion salt, garlic salt, seasoned salt, table salt, and sea salt. Canned and packaged gravies. Worcestershire sauce. Tartar sauce. Barbecue sauce. Teriyaki sauce. Soy sauce, including reduced-sodium. Steak sauce. Fish sauce. Oyster sauce. Cocktail sauce. Horseradish that you find on the shelf. Regular ketchup and mustard. Meat flavorings and tenderizers. Bouillon cubes. Hot sauce and Tabasco sauce. Premade or packaged marinades. Premade or packaged taco seasonings. Relishes. Regular salad dressings. Salsa. Potato and tortilla chips. Corn chips and puffs. Salted popcorn and pretzels. Canned or dried soups. Pizza. Frozen entrees and pot pies. Summary  Eating less sodium can help lower your blood pressure, reduce swelling, and protect your heart, liver, and kidneys.  Most people on this plan should limit their sodium intake to 1,500-2,000 mg (milligrams) of sodium each day.  Canned, boxed, and frozen foods are high in sodium. Restaurant foods, fast foods, and pizza are also very high in sodium. You also get sodium by adding salt to food.  Try to cook at home, eat more fresh fruits and vegetables, and eat less fast food, canned, processed, or prepared foods. This information is not intended to replace advice given to  you by your health care provider. Make sure you discuss any questions you have with your health care provider. Document Released: 03/13/2002 Document Revised: 09/03/2017 Document Reviewed: 09/14/2016 Elsevier Patient Education  2020 [employer].

## 2019-04-28 NOTE — Progress Notes (Signed)
@Patient  ID: Kenneth Patrick, male    DOB: 06/05/1951, 68 y.o.   MRN: [id number]  Chief Complaint  Patient presents with  . Follow-up    shortness of breath, copd    Referring provider: [name], MD  HPI:  68 year old male former smoker followed in our office for COPD and obstructive sleep apnea  PMH: Obesity, hypertension, hyperlipidemia, type 2 diabetes, cor pulmonale Smoker/ Smoking History: Former smoker.  Quit 2006.  Greater than 60 pack years. Maintenance:  Trelegy Ellipta  Pt of: Dr. Elsworth Soho  04/28/2019  - Visit   68 year old male former smoker followed in our office for COPD and obstructive sleep apnea.  Patient presenting to our office today with worsened shortness of breath.  Patient reports he has had worsening shortness of breath over the last 3 to 4 weeks.  He is also reporting wheezing, increased cough, increased mucus production, patient's unable to tell me if his mucus color is changed because he does not look at this.  He continues to be maintained on Trelegy Ellipta.  He is also using his rescue inhaler 2-3 times daily at least.  Having shortness of breath with physical activity.  Patient wanted to present to our office for further evaluation.  Patient also admits that he has not taken his fluid pill for the last couple of days due to multiple doctors office visits.  He is typically supposed to take 40 mg of Lasix daily.  He does not weigh himself daily.  Patient is also maintained at home on CPAP therapy.  He reports that this is been going well.  We do not have a download at this time to confirm.  MMRC - Breathlessness Score 3 - I stop for breath after walking about 100 yards or after a few minutes on level ground (isle at grocery store is 11ft)     Tests:   04/18/2019-SARS-CoV-2-negative  04/16/2017-CTA chest-no evidence of PE, lingular scarring in lung bases, CAD, small pericardial effusion stable since 2015  04/08/2017-echocardiogram- LV ejection fraction 60  to 65%, mild LVH  07/08/2016-home sleep study- AHI 39.6, SaO2 low 84%  07/24/2016-CPAP titration- trial of auto CPAP 10-15   SIX MIN WALK 04/28/2019 02/11/2017 12/13/2013  Supplimental Oxygen during Test? (L/min) No No No  Tech Comments: slow-moderate pace, pt unable to complete more than 1 lap due to dyspnea slow-moderate pace, only 1 lap completed due to dyspnea on exertion -     FENO:  Lab Results  Component Value Date   NITRICOXIDE 13 02/10/2017    PFT: No flowsheet data found.  Imaging: Dg Chest 2 View  Result Date: 04/28/2019 CLINICAL DATA:  Cough, COPD, diabetes mellitus, hypertension EXAM: CHEST - 2 VIEW COMPARISON:  01/27/2017 FINDINGS: Normal heart size, mediastinal contours, and pulmonary vascularity. Chronic bronchitic changes. No infiltrate, pleural effusion or pneumothorax. Bones unremarkable. IMPRESSION: Mild chronic bronchitic changes without acute abnormalities. Electronically Signed   By: Lavonia Dana M.D.   On: 04/28/2019 16:29      Specialty Problems      Pulmonary Problems   ALLERGIC RHINITIS CAUSE UNSPECIFIED    Qualifier: Diagnosis of  By: Delfino Lovett        OSA (obstructive sleep apnea)    CPAP 14       COPD (chronic obstructive pulmonary disease) (HCC)    FEV1 of 25 %,          Allergies  Allergen Reactions  . Penicillins Swelling and Rash    Did it involve  swelling of the face/tongue/throat, SOB, or low BP? Yes Did it involve sudden or severe rash/hives, skin peeling, or any reaction on the inside of your mouth or nose? No Did you need to seek medical attention at a hospital or doctor's office? Yes When did it last happen?Childhood allergy If all above answers are "NO", may proceed with cephalosporin use.      Immunization History  Administered Date(s) Administered  . Influenza Split 10/23/2011  . Influenza Whole 09/04/2010  . Influenza,inj,Quad PF,6+ Mos 06/08/2013, 09/03/2014, 07/31/2015, 06/22/2016, 06/29/2018  .  Pneumococcal Conjugate-13 03/05/2015  . Pneumococcal Polysaccharide-23 05/14/2011, 11/27/2013    Past Medical History:  Diagnosis Date  . Cancer ([place])    basal cell   . COPD (chronic obstructive pulmonary disease) (HCC)    uses inhalers  . Depression   . Diabetic neuropathy ([place])   . DM2 (diabetes mellitus, type 2) (HCC)    insulin dependent  . Dyspnea    with exertion  . Hypercholesterolemia   . Low back pain    no meds  . Respiratory infection   . Sleep apnea    uses CPAP    Tobacco History: Social History   Tobacco Use  Smoking Status Former Smoker  . Packs/day: 1.50  . Years: 20.00  . Pack years: 30.00  . Quit date: 10/05/2004  . Years since quitting: 14.5  Smokeless Tobacco Never Used   Counseling given: Yes  Continue to not smoke  Outpatient Encounter Medications as of 04/28/2019  Medication Sig  . albuterol (VENTOLIN HFA) 108 (90 BASE) MCG/ACT inhaler Inhale 2 puffs into the lungs every 6 (six) hours as needed for wheezing or shortness of breath.   [name] atorvastatin (LIPITOR) 80 MG tablet Take 80 mg by mouth at bedtime.   . Continuous Blood Gluc Sensor (FREESTYLE LIBRE SENSOR SYSTEM) MISC Use one sensor every 10 days.  . Fluticasone-Umeclidin-Vilant (TRELEGY ELLIPTA) 100-62.5-25 MCG/INH AEPB Inhale 1 puff into the lungs daily.  . furosemide (LASIX) 40 MG tablet Take 40 mg by mouth daily as needed for fluid.   [name] gabapentin (NEURONTIN) 300 MG capsule Take 300 mg by mouth 2 (two) times daily.   . Insulin Detemir (LEVEMIR FLEXTOUCH) 100 UNIT/ML Pen Inject 80 Units into the skin at bedtime.  . Insulin Lispro (HUMALOG KWIKPEN) 200 UNIT/ML SOPN Inject 30-36 Units into the skin 3 (three) times daily before meals.  . liraglutide (VICTOZA) 18 MG/3ML SOPN INJECT 1.8MG  SUBCUTANEOUSLY DAILY (Patient taking differently: Inject 1.8 mg into the skin daily. )  . nepafenac (ILEVRO) 0.3 % ophthalmic suspension Place 1 drop into the right eye daily.  [name] oxymetazoline (AFRIN) 0.05 %  nasal spray Place 1-2 sprays into both nostrils 2 (two) times daily as needed for congestion.   . potassium chloride SA (KLOR-CON M20) 20 MEQ tablet Take 1 tablet (20 mEq total) by mouth daily. (Patient taking differently: Take 20 mEq by mouth daily as needed (when taking furosemide). )  . prednisoLONE acetate (PRED FORTE) 1 % ophthalmic suspension Place 1 drop into the right eye 2 (two) times daily.  . sertraline (ZOLOFT) 100 MG tablet Take 200 mg by mouth daily.   [name] tobramycin (TOBREX) 0.3 % ophthalmic solution Place 1 drop into the right eye See admin instructions. Instill 1 drop into the right eye 3 times daily, the day before, the day of and the day after eye injection done every 6 weeks.  [name] doxycycline (VIBRA-TABS) 100 MG tablet Take 1 tablet (100 mg total) by mouth 2 (  two) times daily.  . predniSONE (DELTASONE) 10 MG tablet Take 1 tablet (10 mg total) by mouth daily with breakfast.   No facility-administered encounter medications on file as of 04/28/2019.      Review of Systems  Review of Systems  Constitutional: Positive for fatigue. Negative for activity change, diaphoresis and fever.  HENT: Positive for congestion. Negative for postnasal drip, sneezing, sore throat and trouble swallowing.   Eyes: Negative.   Respiratory: Positive for cough (Productive cough with increased mucus production), shortness of breath and wheezing.   Cardiovascular: Positive for leg swelling. Negative for chest pain and palpitations.  Gastrointestinal: Negative for diarrhea, nausea and vomiting.  Endocrine: Negative.   Genitourinary: Negative.   Musculoskeletal: Negative.  Negative for arthralgias.  Skin: Negative.   Allergic/Immunologic: Negative.   Neurological: Negative.   Hematological: Negative.   Psychiatric/Behavioral: Negative.  Negative for dysphoric mood. The patient is not nervous/anxious.      Physical Exam  BP 122/66 (BP Location: Right Arm, Cuff Size: Large)   Pulse 73   Temp 98.7 F  (37.1 C) (Oral)   Ht 6\' 4"  (1.93 m)   Wt (!) 382 lb 12.8 oz (173.6 kg)   SpO2 95%   BMI 46.60 kg/m   Wt Readings from Last 5 Encounters:  04/28/19 (!) 382 lb 12.8 oz (173.6 kg)  04/18/19 (!) 376 lb (170.6 kg)  03/30/19 (!) 378 lb (171.5 kg)  01/31/19 (!) 378 lb (171.5 kg)  12/27/18 (!) 383 lb (173.7 kg)     Physical Exam Vitals signs and nursing note reviewed.  Constitutional:      General: He is not in acute distress.    Appearance: Normal appearance. He is obese.  HENT:     Head: Normocephalic and atraumatic.     Right Ear: Hearing, tympanic membrane, ear canal and external ear normal.     Left Ear: Hearing, tympanic membrane, ear canal and external ear normal.     Nose: Rhinorrhea present. No mucosal edema.     Right Turbinates: Not enlarged.     Left Turbinates: Not enlarged.     Mouth/Throat:     Mouth: Mucous membranes are dry.     Pharynx: Oropharynx is clear. No oropharyngeal exudate.  Eyes:     Pupils: Pupils are equal, round, and reactive to light.  Neck:     Musculoskeletal: Normal range of motion.  Cardiovascular:     Rate and Rhythm: Normal rate and regular rhythm.     Pulses: Normal pulses.     Heart sounds: Normal heart sounds. No murmur.  Pulmonary:     Effort: Pulmonary effort is normal.     Breath sounds: Wheezing (Expiratory wheeze) present. No decreased breath sounds or rales.  Abdominal:     General: Bowel sounds are normal. There is no distension.     Palpations: Abdomen is soft.     Tenderness: There is no abdominal tenderness.     Comments: Obese  Musculoskeletal: Normal range of motion.     Right lower leg: Edema (3+ lower extremity edema) present.     Left lower leg: Edema (3+ lower extremity edema) present.  Lymphadenopathy:     Cervical: No cervical adenopathy.  Skin:    General: Skin is warm and dry.     Capillary Refill: Capillary refill takes less than 2 seconds.     Findings: No erythema or rash.  Neurological:     General: No  focal deficit present.     Mental  Status: He is alert and oriented to person, place, and time.     Motor: No weakness.     Coordination: Coordination normal.     Gait: Gait is intact. Gait (Completed 1 lap in office) normal.  Psychiatric:        Mood and Affect: Mood normal.        Behavior: Behavior normal. Behavior is cooperative.        Thought Content: Thought content normal.        Judgment: Judgment normal.      Lab Results:  CBC    Component Value Date/Time   WBC 7.1 12/13/2018 1436   RBC 4.03 (L) 12/13/2018 1436   HGB 11.5 (L) 12/13/2018 1436   HCT 36.8 (L) 12/13/2018 1436   PLT 229 12/13/2018 1436   MCV 91.3 12/13/2018 1436   MCH 28.5 12/13/2018 1436   MCHC 31.3 12/13/2018 1436   RDW 14.6 12/13/2018 1436   LYMPHSABS 1.9 02/10/2017 1606   MONOABS 0.7 02/10/2017 1606   EOSABS 0.4 02/10/2017 1606   BASOSABS 0.1 02/10/2017 1606    BMET    Component Value Date/Time   NA 139 04/18/2019 0928   NA 145 (H) 03/21/2019 1505   K 3.8 04/18/2019 0928   CL 106 04/18/2019 0928   CO2 23 04/18/2019 0928   GLUCOSE 212 (H) 04/18/2019 0928   BUN 16 04/18/2019 0928   BUN 22 03/21/2019 1505   CREATININE 1.48 (H) 04/18/2019 0928   CREATININE 1.27 (H) 06/26/2016 1403   CALCIUM 8.6 (L) 04/18/2019 0928   GFRNONAA 48 (L) 04/18/2019 0928   GFRNONAA 59 (L) 06/26/2016 1403   GFRAA 56 (L) 04/18/2019 0928   GFRAA 69 06/26/2016 1403    BNP No results found for: BNP  ProBNP    Component Value Date/Time   PROBNP 16.0 02/10/2017 1606      Assessment & Plan:   Cor pulmonale, chronic (HCC) Plan: Take 40 mg of Lasix as soon as you get home Restart and take your Lasix as prescribed Lab work today Chest x-ray today Start weighing yourself daily Follow low-sodium diet Elevate legs when able Close follow-up with our office in 4 to 6 weeks  COPD (chronic obstructive pulmonary disease) Plan: Doxycycline today Prednisone Continue Trelegy Ellipta Continue rescue inhaler as  needed Walk at next office visit Chest x-ray today Lab work today    OSA (obstructive sleep apnea) Plan: Continue CPAP therapy as prescribed  Morbid obesity due to excess calories ([place]) Plan: Continue to work towards increasing physical activity to reduce body weight and BMI    Return in about 4 weeks (around 05/26/2019), or if symptoms worsen or fail to improve, for Follow up with Wyn Quaker FNP-C, Follow up with Tammy Parrett  ANP-BC, Follow up with Dr. Elsworth Soho.   Lauraine Rinne, NP 04/28/2019   This appointment was 42 minutes long with over 50% of the time in direct face-to-face patient care, assessment, plan of care, and follow-up.

## 2019-04-28 NOTE — Assessment & Plan Note (Signed)
Plan: Take 40 mg of Lasix as soon as you get home Restart and take your Lasix as prescribed Lab work today Chest x-ray today Start weighing yourself daily Follow low-sodium diet Elevate legs when able Close follow-up with our office in 4 to 6 weeks

## 2019-04-28 NOTE — Assessment & Plan Note (Signed)
Plan: Doxycycline today Prednisone Continue Trelegy Ellipta Continue rescue inhaler as needed Walk at next office visit Chest x-ray today Lab work today

## 2019-04-28 NOTE — Assessment & Plan Note (Signed)
Plan: Continue CPAP therapy as prescribed

## 2019-04-29 LAB — PRO B NATRIURETIC PEPTIDE: NT-Pro BNP: 95 pg/mL (ref 0–376)

## 2019-05-01 NOTE — Progress Notes (Signed)
See cxray and labwork notes.   B

## 2019-05-01 NOTE — Progress Notes (Signed)
Lab work stable.  No changes in plan of care at this time.  Keep close follow-up with our office.  Wyn Quaker, FNP

## 2019-05-08 DIAGNOSIS — I10 Essential (primary) hypertension: Secondary | ICD-10-CM | POA: Diagnosis not present

## 2019-05-08 DIAGNOSIS — E1165 Type 2 diabetes mellitus with hyperglycemia: Secondary | ICD-10-CM | POA: Diagnosis not present

## 2019-05-08 DIAGNOSIS — Z299 Encounter for prophylactic measures, unspecified: Secondary | ICD-10-CM | POA: Diagnosis not present

## 2019-05-08 DIAGNOSIS — J449 Chronic obstructive pulmonary disease, unspecified: Secondary | ICD-10-CM | POA: Diagnosis not present

## 2019-05-08 DIAGNOSIS — E113599 Type 2 diabetes mellitus with proliferative diabetic retinopathy without macular edema, unspecified eye: Secondary | ICD-10-CM | POA: Diagnosis not present

## 2019-05-08 DIAGNOSIS — E1142 Type 2 diabetes mellitus with diabetic polyneuropathy: Secondary | ICD-10-CM | POA: Diagnosis not present

## 2019-05-08 DIAGNOSIS — Z6841 Body Mass Index (BMI) 40.0 and over, adult: Secondary | ICD-10-CM | POA: Diagnosis not present

## 2019-05-11 ENCOUNTER — Telehealth: Payer: Self-pay | Admitting: Pulmonary Disease

## 2019-05-11 NOTE — Telephone Encounter (Signed)
Dr. Elsworth Soho was in the office today and signed the form. It now has been faxed to [employer] and I have received confirmation that form has been received

## 2019-05-11 NOTE — Telephone Encounter (Signed)
Kenneth Patrick with [employer] is calling requesting an update on a faxed CPAP supplies request, however, I am unable to locate a recent referral order for this. Pt last seen 04/28/2019 by BPM. Within the office notes, BPM wrote that patient may continue CPAP, however, no recommendations were made for a new supply order.   PCCs, can you help with this? Thank you.

## 2019-05-25 NOTE — Progress Notes (Signed)
@Patient  ID: Kenneth Patrick, male    DOB: 18-Dec-1950, 68 y.o.   MRN: [id number]  Chief Complaint  Patient presents with  . Follow-up    F/U re: COPD and Cpap. He reports increased SOB more frequently. Audible wheezing on walk back to room. 02 @ 93%. He reports he was given doxycyline at last visit but it only helped temporarily.     Referring provider: [name], MD  HPI:  68 year old male former smoker followed in our office for COPD and obstructive sleep apnea  PMH: Obesity, hypertension, hyperlipidemia, type 2 diabetes, cor pulmonale Smoker/ Smoking History: Former smoker.  Quit 2006.  Greater than 60 pack years. Maintenance:  Trelegy Ellipta  Pt of: Dr. Elsworth Soho  05/26/2019  - Visit   68 year old male former smoker followed in our office for COPD and severe obstructive sleep apnea.  Patient presenting today as a 4-week follow-up this patient was recently treated for a COPD exacerbation.  Patient initially felt the antibiotics and steroids helped with symptoms.  As soon as patient stopped steroids as well as antibiotics symptoms returned.  Patient maintained on 40 mg of Lasix daily.  Patient reports adherence.  Patient's weight is up 9 pounds since last office visit.  He reports that he does not feel like he is having as much urinary output anymore with his diuretics.  Kidney functioning is reduced on blood work from July/2020.  GFR 43.  Patient reports adherence to CPAP.  He reports adherence to Trelegy Ellipta.  He is not adherent to a low-sodium diet.  He reports that he eats fast food often.     Tests:   04/18/2019-SARS-CoV-2-negative  04/28/2019-chest x-ray- mild chronic bronchitic changes without acute abnormalities  04/16/2017-CTA chest-no evidence of PE, lingular scarring in lung bases, CAD, small pericardial effusion stable since 2015  04/08/2017-echocardiogram- LV ejection fraction 60 to 65%, mild LVH  07/08/2016-home sleep study- AHI 39.6, SaO2 low 84%  07/24/2016-CPAP  titration- trial of auto CPAP 10-15  SIX MIN WALK 04/28/2019 02/11/2017 12/13/2013  Supplimental Oxygen during Test? (L/min) No No No  Tech Comments: slow-moderate pace, pt unable to complete more than 1 lap due to dyspnea slow-moderate pace, only 1 lap completed due to dyspnea on exertion -     FENO:  Lab Results  Component Value Date   NITRICOXIDE 13 02/10/2017    PFT: No flowsheet data found.  Imaging: Dg Chest 2 View  Result Date: 04/28/2019 CLINICAL DATA:  Cough, COPD, diabetes mellitus, hypertension EXAM: CHEST - 2 VIEW COMPARISON:  01/27/2017 FINDINGS: Normal heart size, mediastinal contours, and pulmonary vascularity. Chronic bronchitic changes. No infiltrate, pleural effusion or pneumothorax. Bones unremarkable. IMPRESSION: Mild chronic bronchitic changes without acute abnormalities. Electronically Signed   By: Lavonia Dana M.D.   On: 04/28/2019 16:29      Specialty Problems      Pulmonary Problems   ALLERGIC RHINITIS CAUSE UNSPECIFIED    Qualifier: Diagnosis of  By: Delfino Lovett        DYSPNEA    Qualifier: Diagnosis of  By: Percival Spanish, MD, Farrel Gordon        OSA (obstructive sleep apnea)    CPAP 14       COPD (chronic obstructive pulmonary disease) (HCC)    FEV1 of 25 %,          Allergies  Allergen Reactions  . Penicillins Swelling and Rash    Did it involve swelling of the face/tongue/throat, SOB, or low BP? Yes Did it involve  sudden or severe rash/hives, skin peeling, or any reaction on the inside of your mouth or nose? No Did you need to seek medical attention at a hospital or doctor's office? Yes When did it last happen?Childhood allergy If all above answers are "NO", may proceed with cephalosporin use.      Immunization History  Administered Date(s) Administered  . Influenza Split 10/23/2011  . Influenza Whole 09/04/2010  . Influenza,inj,Quad PF,6+ Mos 06/08/2013, 09/03/2014, 07/31/2015, 06/22/2016, 06/29/2018  . Pneumococcal  Conjugate-13 03/05/2015  . Pneumococcal Polysaccharide-23 05/14/2011, 11/27/2013    Past Medical History:  Diagnosis Date  . Cancer ([place])    basal cell   . COPD (chronic obstructive pulmonary disease) (HCC)    uses inhalers  . Depression   . Diabetic neuropathy ([place])   . DM2 (diabetes mellitus, type 2) (HCC)    insulin dependent  . Dyspnea    with exertion  . Hypercholesterolemia   . Low back pain    no meds  . Respiratory infection   . Sleep apnea    uses CPAP    Tobacco History: Social History   Tobacco Use  Smoking Status Former Smoker  . Packs/day: 1.50  . Years: 20.00  . Pack years: 30.00  . Quit date: 10/05/2004  . Years since quitting: 14.6  Smokeless Tobacco Never Used   Counseling given: Yes  Continue to not smoke  Outpatient Encounter Medications as of 05/26/2019  Medication Sig  . albuterol (VENTOLIN HFA) 108 (90 BASE) MCG/ACT inhaler Inhale 2 puffs into the lungs every 6 (six) hours as needed for wheezing or shortness of breath.   [name] atorvastatin (LIPITOR) 80 MG tablet Take 80 mg by mouth at bedtime.   . Continuous Blood Gluc Sensor (FREESTYLE LIBRE SENSOR SYSTEM) MISC Use one sensor every 10 days.  . Fluticasone-Umeclidin-Vilant (TRELEGY ELLIPTA) 100-62.5-25 MCG/INH AEPB Inhale 1 puff into the lungs daily.  . furosemide (LASIX) 40 MG tablet Take 40 mg by mouth daily as needed for fluid.   [name] gabapentin (NEURONTIN) 300 MG capsule Take 300 mg by mouth 2 (two) times daily.   . Insulin Detemir (LEVEMIR FLEXTOUCH) 100 UNIT/ML Pen Inject 80 Units into the skin at bedtime.  . Insulin Lispro (HUMALOG KWIKPEN) 200 UNIT/ML SOPN Inject 30-36 Units into the skin 3 (three) times daily before meals.  . liraglutide (VICTOZA) 18 MG/3ML SOPN INJECT 1.8MG  SUBCUTANEOUSLY DAILY (Patient taking differently: Inject 1.8 mg into the skin daily. )  . oxymetazoline (AFRIN) 0.05 % nasal spray Place 1-2 sprays into both nostrils 2 (two) times daily as needed for congestion.   .  potassium chloride SA (KLOR-CON M20) 20 MEQ tablet Take 1 tablet (20 mEq total) by mouth daily. (Patient taking differently: Take 20 mEq by mouth daily as needed (when taking furosemide). )  . sertraline (ZOLOFT) 100 MG tablet Take 200 mg by mouth daily.   [name] tobramycin (TOBREX) 0.3 % ophthalmic solution Place 1 drop into the right eye See admin instructions. Instill 1 drop into the right eye 3 times daily, the day before, the day of and the day after eye injection done every 6 weeks.  . [DISCONTINUED] doxycycline (VIBRA-TABS) 100 MG tablet Take 1 tablet (100 mg total) by mouth 2 (two) times daily.  . [DISCONTINUED] nepafenac (ILEVRO) 0.3 % ophthalmic suspension Place 1 drop into the right eye daily.  . [DISCONTINUED] prednisoLONE acetate (PRED FORTE) 1 % ophthalmic suspension Place 1 drop into the right eye 2 (two) times daily.  . [DISCONTINUED] predniSONE (DELTASONE) 10  MG tablet Take 1 tablet (10 mg total) by mouth daily with breakfast.   No facility-administered encounter medications on file as of 05/26/2019.      Review of Systems  Review of Systems  Constitutional: Positive for fatigue and unexpected weight change (Weight increase). Negative for activity change, chills and fever.  HENT: Positive for congestion. Negative for postnasal drip, rhinorrhea, sinus pressure, sinus pain and sore throat.   Eyes: Negative.   Respiratory: Positive for cough, shortness of breath and wheezing.   Cardiovascular: Positive for palpitations and leg swelling. Negative for chest pain.  Gastrointestinal: Negative for constipation, diarrhea, nausea and vomiting.  Endocrine: Negative.   Genitourinary: Negative.   Musculoskeletal: Negative.   Skin: Negative.   Neurological: Negative for dizziness and headaches.  Psychiatric/Behavioral: Negative.  Negative for dysphoric mood. The patient is not nervous/anxious.   All other systems reviewed and are negative.    Physical Exam  BP 138/78   Pulse 64   Temp  98.1 F (36.7 C) (Temporal)   Ht 6\' 4"  (1.93 m)   Wt (!) 391 lb 12.8 oz (177.7 kg)   SpO2 93%   BMI 47.69 kg/m   Wt Readings from Last 5 Encounters:  05/26/19 (!) 391 lb 12.8 oz (177.7 kg)  04/28/19 (!) 382 lb 12.8 oz (173.6 kg)  04/18/19 (!) 376 lb (170.6 kg)  03/30/19 (!) 378 lb (171.5 kg)  01/31/19 (!) 378 lb (171.5 kg)   Discussed weight today   Physical Exam Vitals signs and nursing note reviewed.  Constitutional:      General: He is not in acute distress.    Appearance: Normal appearance. He is obese.  HENT:     Head: Normocephalic and atraumatic.     Right Ear: Hearing and external ear normal.     Left Ear: Hearing and external ear normal.     Ears:     Comments: Hearing aids bilaterally    Nose: Nose normal. No mucosal edema or rhinorrhea.     Right Turbinates: Not enlarged.     Left Turbinates: Not enlarged.     Mouth/Throat:     Mouth: Mucous membranes are dry.     Pharynx: Oropharynx is clear. No oropharyngeal exudate.  Eyes:     Pupils: Pupils are equal, round, and reactive to light.  Neck:     Musculoskeletal: Normal range of motion.  Cardiovascular:     Rate and Rhythm: Normal rate and regular rhythm.     Pulses: Normal pulses.     Heart sounds: Normal heart sounds. No murmur.  Pulmonary:     Effort: Pulmonary effort is normal.     Breath sounds: No decreased breath sounds, wheezing or rales.     Comments: Diminished breath sounds throughout exam Abdominal:     General: Bowel sounds are normal. There is no distension.     Palpations: Abdomen is soft.     Tenderness: There is no abdominal tenderness.     Comments: Obese  Musculoskeletal:     Right lower leg: Edema (3+ pitting ) present.     Left lower leg: Edema (3+ pitting ) present.  Lymphadenopathy:     Cervical: No cervical adenopathy.  Skin:    General: Skin is warm and dry.     Capillary Refill: Capillary refill takes less than 2 seconds.     Findings: No erythema or rash.  Neurological:      General: No focal deficit present.     Mental Status: He  is alert and oriented to person, place, and time.     Motor: No weakness.     Coordination: Coordination normal.     Gait: Gait is intact. Gait normal.  Psychiatric:        Mood and Affect: Mood normal.        Behavior: Behavior normal. Behavior is cooperative.        Thought Content: Thought content normal.        Judgment: Judgment normal.      Lab Results:  CBC    Component Value Date/Time   WBC 7.1 12/13/2018 1436   RBC 4.03 (L) 12/13/2018 1436   HGB 11.5 (L) 12/13/2018 1436   HCT 36.8 (L) 12/13/2018 1436   PLT 229 12/13/2018 1436   MCV 91.3 12/13/2018 1436   MCH 28.5 12/13/2018 1436   MCHC 31.3 12/13/2018 1436   RDW 14.6 12/13/2018 1436   LYMPHSABS 1.9 02/10/2017 1606   MONOABS 0.7 02/10/2017 1606   EOSABS 0.4 02/10/2017 1606   BASOSABS 0.1 02/10/2017 1606    BMET    Component Value Date/Time   NA 139 04/18/2019 0928   NA 145 (H) 03/21/2019 1505   K 3.8 04/18/2019 0928   CL 106 04/18/2019 0928   CO2 23 04/18/2019 0928   GLUCOSE 212 (H) 04/18/2019 0928   BUN 16 04/18/2019 0928   BUN 22 03/21/2019 1505   CREATININE 1.48 (H) 04/18/2019 0928   CREATININE 1.27 (H) 06/26/2016 1403   CALCIUM 8.6 (L) 04/18/2019 0928   GFRNONAA 48 (L) 04/18/2019 0928   GFRNONAA 59 (L) 06/26/2016 1403   GFRAA 56 (L) 04/18/2019 0928   GFRAA 69 06/26/2016 1403    BNP No results found for: BNP  ProBNP    Component Value Date/Time   PROBNP 95 04/28/2019 1616   PROBNP 16.0 02/10/2017 1606    Assessment & Plan:   Cor pulmonale, chronic (HCC) Plan: Start 40 mg of Lasix twice daily Echocardiogram ordered today  Lab work today Continue weighing yourself daily Follow low-sodium diet Referral to dietician  Elevate legs when able Referral to cardiology  Follow up in 2-4 weeks with Dr. Elsworth Soho or Wyn Quaker FNP   COPD (chronic obstructive pulmonary disease) Plan: Continue Trelegy Ellipta Continue rescue inhaler as  needed Start mucinex daily Walk today Lab work today    OSA (obstructive sleep apnea) Plan:  Continue CPAP daily  Uncontrolled type 2 diabetes mellitus with complication, with long-term current use of insulin ([place]) Plan:  Ref to dietician today   DYSPNEA Plan:  Lab work today Echocardiogram ordered today Referral to cardiology Start taking Lasix 40 mg twice daily Continue to weigh yourself daily Close follow-up with our office in 2 to 4 weeks  Healthcare maintenance Plan: Pneumovax 23 needed at next office visit Flu vaccine at next office visit    Return in about 2 weeks (around 06/09/2019), or if symptoms worsen or fail to improve, for Follow up with Wyn Quaker FNP-C, Follow up with Dr. Elsworth Soho.   Lauraine Rinne, NP 05/26/2019    This appointment was 30 minutes long with over 50% of the time in direct face-to-face patient care, assessment, plan of care, and follow-up.

## 2019-05-26 ENCOUNTER — Encounter: Payer: Self-pay | Admitting: Pulmonary Disease

## 2019-05-26 ENCOUNTER — Other Ambulatory Visit: Payer: Self-pay

## 2019-05-26 ENCOUNTER — Ambulatory Visit (INDEPENDENT_AMBULATORY_CARE_PROVIDER_SITE_OTHER): Payer: Medicare Other | Admitting: Pulmonary Disease

## 2019-05-26 VITALS — BP 138/78 | HR 64 | Temp 98.1°F | Ht 76.0 in | Wt 391.8 lb

## 2019-05-26 DIAGNOSIS — G4733 Obstructive sleep apnea (adult) (pediatric): Secondary | ICD-10-CM | POA: Diagnosis not present

## 2019-05-26 DIAGNOSIS — Z0000 Encounter for general adult medical examination without abnormal findings: Secondary | ICD-10-CM | POA: Insufficient documentation

## 2019-05-26 DIAGNOSIS — Z794 Long term (current) use of insulin: Secondary | ICD-10-CM | POA: Diagnosis not present

## 2019-05-26 DIAGNOSIS — E118 Type 2 diabetes mellitus with unspecified complications: Secondary | ICD-10-CM

## 2019-05-26 DIAGNOSIS — E1165 Type 2 diabetes mellitus with hyperglycemia: Secondary | ICD-10-CM | POA: Diagnosis not present

## 2019-05-26 DIAGNOSIS — J449 Chronic obstructive pulmonary disease, unspecified: Secondary | ICD-10-CM

## 2019-05-26 DIAGNOSIS — R0602 Shortness of breath: Secondary | ICD-10-CM

## 2019-05-26 DIAGNOSIS — IMO0002 Reserved for concepts with insufficient information to code with codable children: Secondary | ICD-10-CM

## 2019-05-26 DIAGNOSIS — I2781 Cor pulmonale (chronic): Secondary | ICD-10-CM

## 2019-05-26 NOTE — Assessment & Plan Note (Signed)
Plan:  Continue CPAP daily

## 2019-05-26 NOTE — Assessment & Plan Note (Addendum)
Plan: Continue Trelegy Ellipta Continue rescue inhaler as needed Start mucinex daily Walk today Lab work today

## 2019-05-26 NOTE — Assessment & Plan Note (Signed)
Plan:  Ref to dietician today

## 2019-05-26 NOTE — Assessment & Plan Note (Signed)
Plan: Pneumovax 23 needed at next office visit Flu vaccine at next office visit

## 2019-05-26 NOTE — Assessment & Plan Note (Signed)
Plan:  Lab work today Echocardiogram ordered today Referral to cardiology Start taking Lasix 40 mg twice daily Continue to weigh yourself daily Close follow-up with our office in 2 to 4 weeks

## 2019-05-26 NOTE — Patient Instructions (Addendum)
You were seen today by Lauraine Rinne, NP  for:   1. OSA (obstructive sleep apnea)  We recommend that you continue using your CPAP daily >>>Keep up the hard work using your device >>> Goal should be wearing this for the entire night that you are sleeping, at least 4 to 6 hours  Remember:  . Do not drive or operate heavy machinery if tired or drowsy.  . Please notify the supply company and office if you are unable to use your device regularly due to missing supplies or machine being broken.  . Work on maintaining a healthy weight and following your recommended nutrition plan  . Maintain proper daily exercise and movement  . Maintaining proper use of your device can also help improve management of other chronic illnesses such as: Blood pressure, blood sugars, and weight management.   BiPAP/ CPAP Cleaning:  >>>Clean weekly, with Dawn soap, and bottle brush.  Set up to air dry.   2. Chronic obstructive pulmonary disease, unspecified COPD type (HCC)  Trelegy Ellipta  >>> 1 puff daily in the morning >>>rinse mouth out after use  >>> This inhaler contains 3 medications that help manage her respiratory status, contact our office if you cannot afford this medication or unable to remain on this medication  3. Cor pulmonale, chronic (HCC)  - ECHOCARDIOGRAM COMPLETE; Future - Pro b natriuretic peptide (BNP); Future - CBC with Differential/Platelet; Future - D-Dimer, Quantitative; Future - Ambulatory referral to Cardiology - Amb ref to Medical Nutrition Therapy-MNT  Lasix 40m tablet  >>> Increase to 437mtwice daily   4. Shortness of breath  - ECHOCARDIOGRAM COMPLETE; Future - Pro b natriuretic peptide (BNP); Future - Comp Met (CMET); Future - CBC with Differential/Platelet; Future - D-Dimer, Quantitative; Future - Ambulatory referral to Cardiology - Amb ref to Medical Nutrition Therapy-MNT  5. Uncontrolled type 2 diabetes mellitus with complication, with long-term current use of  insulin (HCC)  - Amb ref to Medical Nutrition Therapy-MNT   We recommend today:  Orders Placed This Encounter  Procedures  . Pro b natriuretic peptide (BNP)    Standing Status:   Future    Standing Expiration Date:   05/25/2020  . D-Dimer, Quantitative    Standing Status:   Future    Standing Expiration Date:   05/25/2020  . CBC with Differential/Platelet    Standing Status:   Future    Standing Expiration Date:   05/25/2020  . Comp Met (CMET)    Standing Status:   Future    Standing Expiration Date:   05/25/2020  . Ambulatory referral to Cardiology    Referral Priority:   Urgent    Referral Type:   Consultation    Referral Reason:   Specialty Services Required    Requested Specialty:   Cardiology    Number of Visits Requested:   1  . Amb ref to Medical Nutrition Therapy-MNT    Referral Priority:   Routine    Referral Type:   Consultation    Referral Reason:   Specialty Services Required    Requested Specialty:   Nutrition    Number of Visits Requested:   1  . ECHOCARDIOGRAM COMPLETE    Standing Status:   Future    Standing Expiration Date:   08/25/2020    Order Specific Question:   Where should this test be performed    [facility]   Order Specific Question:   Does the patient  weigh less than or greater than 250 lbs?    Answer:   Patient weighs greater than 250 lbs    Order Specific Question:   Perflutren DEFINITY (image enhancing agent) should be administered unless hypersensitivity or allergy exist    Answer:   Administer Perflutren    Order Specific Question:   Reason for exam-Echo    Answer:   Dyspnea  786.09 / R06.00   Orders Placed This Encounter  Procedures  . Pro b natriuretic peptide (BNP)  . D-Dimer, Quantitative  . CBC with Differential/Platelet  . Comp Met (CMET)  . Ambulatory referral to Cardiology  . Amb ref to Medical Nutrition Therapy-MNT  . ECHOCARDIOGRAM COMPLETE   No orders of the defined types were placed in  this encounter.   Follow Up:    Return in about 2 weeks (around 06/09/2019), or if symptoms worsen or fail to improve, for Follow up with Wyn Quaker FNP-C, Follow up with Dr. Elsworth Soho.   Please do your part to reduce the spread of COVID-19:      Reduce your risk of any infection  and COVID19 by using the similar precautions used for avoiding the common cold or flu:  [name] Wash your hands often with soap and warm water for at least 20 seconds.  If soap and water are not readily available, use an alcohol-based hand sanitizer with at least 60% alcohol.  . If coughing or sneezing, cover your mouth and nose by coughing or sneezing into the elbow areas of your shirt or coat, into a tissue or into your sleeve (not your hands). Langley Gauss A MASK when in public  . Avoid shaking hands with others and consider head nods or verbal greetings only. . Avoid touching your eyes, nose, or mouth with unwashed hands.  . Avoid close contact with people who are sick. . Avoid places or events with large numbers of people in one location, like concerts or sporting events. . If you have some symptoms but not all symptoms, continue to monitor at home and seek medical attention if your symptoms worsen. . If you are having a medical emergency, call 911.   [place] / e-Visit: [email or web address]         [facility]: [facility]: [phone]                   [facility]: [phone]     It is flu season:   >>> Best ways to protect herself from the flu: Receive the yearly flu vaccine, practice good hand hygiene washing with soap and also using hand sanitizer when available, eat a nutritious meals, get adequate rest, hydrate appropriately   Please contact the office if your symptoms worsen or you have concerns that you are not improving.   Thank you for choosing  [facility] for your healthcare, and for allowing [place] to partner with you on your healthcare journey. I am thankful to be able to provide care to you today.   Wyn Quaker FNP-C

## 2019-05-26 NOTE — Assessment & Plan Note (Signed)
Plan: Start 40 mg of Lasix twice daily Echocardiogram ordered today  Lab work today Continue weighing yourself daily Follow low-sodium diet Referral to dietician  Elevate legs when able Referral to cardiology  Follow up in 2-4 weeks with Dr. Elsworth Soho or Wyn Quaker FNP

## 2019-05-27 LAB — COMPREHENSIVE METABOLIC PANEL
AG Ratio: 1.8 (calc) (ref 1.0–2.5)
ALT: 42 U/L (ref 9–46)
AST: 67 U/L — ABNORMAL HIGH (ref 10–35)
Albumin: 4.1 g/dL (ref 3.6–5.1)
Alkaline phosphatase (APISO): 115 U/L (ref 35–144)
BUN/Creatinine Ratio: 14 (calc) (ref 6–22)
BUN: 21 mg/dL (ref 7–25)
CO2: 24 mmol/L (ref 20–32)
Calcium: 9.2 mg/dL (ref 8.6–10.3)
Chloride: 102 mmol/L (ref 98–110)
Creat: 1.47 mg/dL — ABNORMAL HIGH (ref 0.70–1.25)
Globulin: 2.3 g/dL (calc) (ref 1.9–3.7)
Glucose, Bld: 348 mg/dL — ABNORMAL HIGH (ref 65–99)
Potassium: 4.5 mmol/L (ref 3.5–5.3)
Sodium: 138 mmol/L (ref 135–146)
Total Bilirubin: 1.2 mg/dL (ref 0.2–1.2)
Total Protein: 6.4 g/dL (ref 6.1–8.1)

## 2019-05-27 LAB — CBC WITH DIFFERENTIAL/PLATELET
Absolute Monocytes: 520 cells/uL (ref 200–950)
Basophils Absolute: 39 cells/uL (ref 0–200)
Basophils Relative: 0.6 %
Eosinophils Absolute: 371 cells/uL (ref 15–500)
Eosinophils Relative: 5.7 %
HCT: 36.6 % — ABNORMAL LOW (ref 38.5–50.0)
Hemoglobin: 12.2 g/dL — ABNORMAL LOW (ref 13.2–17.1)
Lymphs Abs: 1391 cells/uL (ref 850–3900)
MCH: 29.2 pg (ref 27.0–33.0)
MCHC: 33.3 g/dL (ref 32.0–36.0)
MCV: 87.6 fL (ref 80.0–100.0)
MPV: 9.9 fL (ref 7.5–12.5)
Monocytes Relative: 8 %
Neutro Abs: 4180 cells/uL (ref 1500–7800)
Neutrophils Relative %: 64.3 %
Platelets: 219 10*3/uL (ref 140–400)
RBC: 4.18 10*6/uL — ABNORMAL LOW (ref 4.20–5.80)
RDW: 14.5 % (ref 11.0–15.0)
Total Lymphocyte: 21.4 %
WBC: 6.5 10*3/uL (ref 3.8–10.8)

## 2019-05-27 LAB — PRO B NATRIURETIC PEPTIDE: NT-Pro BNP: 45 pg/mL (ref 0–376)

## 2019-05-27 LAB — D-DIMER, QUANTITATIVE: D-Dimer, Quant: 0.4 mcg/mL FEU (ref <0.50)

## 2019-05-28 NOTE — Progress Notes (Signed)
Blood work is come back.  Showing no significant fluid overload.  Also showing no elevations for concerns of clot.  Blood sugars are significantly elevated.  Kidney functioning continues to be reduced.  Have your weights come down at all by increasing your Lasix?  Wyn Quaker, FNP

## 2019-05-30 NOTE — Progress Notes (Signed)
Okay.  Noted.  Proceed forward with echocardiogram.  Proceed forward with scheduled cardiology office visit.  Continue forward on Lasix 40 mg twice daily.  Also please explained to patient that blood work showed significantly elevated blood sugars.  He needs to update Dr. Woody Seller regarding this.  Wyn Quaker FNP

## 2019-05-31 ENCOUNTER — Other Ambulatory Visit: Payer: Self-pay

## 2019-05-31 ENCOUNTER — Ambulatory Visit (HOSPITAL_COMMUNITY)
Admission: RE | Admit: 2019-05-31 | Discharge: 2019-05-31 | Disposition: A | Discharge status: Home / Self Care | Payer: Medicare Other | Source: Ambulatory Visit | Attending: Pulmonary Disease | Admitting: Pulmonary Disease

## 2019-05-31 DIAGNOSIS — R0602 Shortness of breath: Secondary | ICD-10-CM | POA: Diagnosis not present

## 2019-05-31 DIAGNOSIS — I2781 Cor pulmonale (chronic): Secondary | ICD-10-CM | POA: Diagnosis not present

## 2019-05-31 NOTE — Progress Notes (Signed)
*  PRELIMINARY RESULTS* Echocardiogram 2D Echocardiogram has been performed.  Kenneth Patrick 05/31/2019, 2:54 PM

## 2019-06-01 ENCOUNTER — Other Ambulatory Visit: Payer: Self-pay

## 2019-06-01 MED ORDER — OZEMPIC (0.25 OR 0.5 MG/DOSE) 2 MG/1.5ML ~~LOC~~ SOPN: 0.5000 mg | PEN_INJECTOR | SUBCUTANEOUS | 2 refills | Status: DC

## 2019-06-06 DIAGNOSIS — H31091 Other chorioretinal scars, right eye: Secondary | ICD-10-CM | POA: Diagnosis not present

## 2019-06-06 DIAGNOSIS — E113211 Type 2 diabetes mellitus with mild nonproliferative diabetic retinopathy with macular edema, right eye: Secondary | ICD-10-CM | POA: Diagnosis not present

## 2019-06-06 DIAGNOSIS — H43821 Vitreomacular adhesion, right eye: Secondary | ICD-10-CM | POA: Diagnosis not present

## 2019-06-06 DIAGNOSIS — Z970 Presence of artificial eye: Secondary | ICD-10-CM | POA: Diagnosis not present

## 2019-06-08 ENCOUNTER — Other Ambulatory Visit: Payer: Self-pay | Admitting: "Endocrinology

## 2019-06-08 NOTE — Progress Notes (Signed)
@Patient  ID: Kenneth Patrick, male    DOB: [birth year], 68 y.o.   MRN: [id number]  Chief Complaint  Patient presents with  . Follow-up    2 wk f/u     Referring provider: [name], MD  HPI:  68 year old male former smoker followed in our office for COPD and obstructive sleep apnea  PMH: Obesity, hypertension, hyperlipidemia, type 2 diabetes, cor pulmonale Smoker/ Smoking History: Former smoker.  Quit 2006.  Greater than 60 pack years. Maintenance:  Trelegy Ellipta  Pt of: Dr. Elsworth Soho  06/09/2019  - Visit   68 year old male former smoker followed in our office for COPD is sleep apnea.  Patient reports compliance to his CPAP.  Patient has been struggling with worsened dyspnea on exertion over the past couple of months.  proBNP negative at last office visit.  D-dimer negative at last office visit.  Most recent echocardiogram stable in comparison to 2018.  Patient also has upcoming consult with cardiology Dr. Virgina Jock.  Patient has not noticed a significant difference from increasing his furosemide.   Tests:   04/18/2019-SARS-CoV-2-negative  04/28/2019-chest x-ray- mild chronic bronchitic changes without acute abnormalities  04/16/2017-CTA chest-no evidence of PE, lingular scarring in lung bases, CAD, small pericardial effusion stable since 2015  04/08/2017-echocardiogram- LV ejection fraction 60 to 65%, mild LVH  05/31/2019-echocardiogram-LV ejection fraction 60 to 65%, elevated left ventricular end-diastolic pressure, mild LVH  07/08/2016-home sleep study- AHI 39.6, SaO2 low 84%  07/24/2016-CPAP titration- trial of auto CPAP 10-15  06/03/2019-proBNP- 45, 05/26/2019-d-dimer- 0.4   SIX MIN WALK 04/28/2019 02/11/2017 12/13/2013  Supplimental Oxygen during Test? (L/min) No No No  Tech Comments: slow-moderate pace, pt unable to complete more than 1 lap due to dyspnea slow-moderate pace, only 1 lap completed due to dyspnea on exertion -    FENO:  Lab Results  Component Value Date   NITRICOXIDE 13 02/10/2017    PFT: No flowsheet data found.  Imaging: No results found.    Specialty Problems      Pulmonary Problems   ALLERGIC RHINITIS CAUSE UNSPECIFIED    Qualifier: Diagnosis of  By: Delfino Lovett        DYSPNEA    Qualifier: Diagnosis of  By: Percival Spanish, MD, Farrel Gordon        OSA (obstructive sleep apnea)    CPAP 14       COPD (chronic obstructive pulmonary disease) (HCC)    FEV1 of 25 %,          Allergies  Allergen Reactions  . Penicillins Swelling and Rash    Did it involve swelling of the face/tongue/throat, SOB, or low BP? Yes Did it involve sudden or severe rash/hives, skin peeling, or any reaction on the inside of your mouth or nose? No Did you need to seek medical attention at a hospital or doctor's office? Yes When did it last happen?Childhood allergy If all above answers are "NO", may proceed with cephalosporin use.      Immunization History  Administered Date(s) Administered  . Influenza Split 10/23/2011  . Influenza Whole 09/04/2010  . Influenza,inj,Quad PF,6+ Mos 06/08/2013, 09/03/2014, 07/31/2015, 06/22/2016, 06/29/2018  . Pneumococcal Conjugate-13 03/05/2015  . Pneumococcal Polysaccharide-23 05/14/2011, 11/27/2013   Pneumovax 23 today High-dose flu vaccine today   Past Medical History:  Diagnosis Date  . Cancer ([place])    basal cell   . COPD (chronic obstructive pulmonary disease) (HCC)    uses inhalers  . Depression   . Diabetic neuropathy ([place])   . DM2 (  diabetes mellitus, type 2) (HCC)    insulin dependent  . Dyspnea    with exertion  . Hypercholesterolemia   . Low back pain    no meds  . Respiratory infection   . Sleep apnea    uses CPAP    Tobacco History: Social History   Tobacco Use  Smoking Status Former Smoker  . Packs/day: 1.50  . Years: 20.00  . Pack years: 30.00  . Quit date: 10/05/2004  . Years since quitting: 14.6  Smokeless Tobacco Never Used   Counseling given: Not  Answered   Continue to not smoke  Outpatient Encounter Medications as of 06/09/2019  Medication Sig  . albuterol (VENTOLIN HFA) 108 (90 BASE) MCG/ACT inhaler Inhale 2 puffs into the lungs every 6 (six) hours as needed for wheezing or shortness of breath.   [name] atorvastatin (LIPITOR) 80 MG tablet Take 80 mg by mouth at bedtime.   . Continuous Blood Gluc Sensor (FREESTYLE LIBRE SENSOR SYSTEM) MISC Use one sensor every 10 days.  . Fluticasone-Umeclidin-Vilant (TRELEGY ELLIPTA) 100-62.5-25 MCG/INH AEPB Inhale 1 puff into the lungs daily.  . furosemide (LASIX) 40 MG tablet Take 40 mg by mouth daily as needed for fluid.   [name] gabapentin (NEURONTIN) 300 MG capsule Take 300 mg by mouth 2 (two) times daily.   [name] HUMALOG KWIKPEN 200 UNIT/ML SOPN INJECT 30 TO 36 UNITS SUBCUTANEOUSLY THREE TIMES DAILY BEFORE MEAL(S)  . Insulin Detemir (LEVEMIR FLEXTOUCH) 100 UNIT/ML Pen Inject 80 Units into the skin at bedtime.  . liraglutide (VICTOZA) 18 MG/3ML SOPN INJECT 1.8MG  SUBCUTANEOUSLY DAILY (Patient taking differently: Inject 1.8 mg into the skin daily. )  . oxymetazoline (AFRIN) 0.05 % nasal spray Place 1-2 sprays into both nostrils 2 (two) times daily as needed for congestion.   . potassium chloride SA (KLOR-CON M20) 20 MEQ tablet Take 1 tablet (20 mEq total) by mouth daily. (Patient taking differently: Take 20 mEq by mouth daily as needed (when taking furosemide). )  . Semaglutide,0.25 or 0.5MG /DOS, (OZEMPIC, 0.25 OR 0.5 MG/DOSE,) 2 MG/1.5ML SOPN Inject 0.5 mg into the skin once a week.  . sertraline (ZOLOFT) 100 MG tablet Take 200 mg by mouth daily.   [name] tobramycin (TOBREX) 0.3 % ophthalmic solution Place 1 drop into the right eye See admin instructions. Instill 1 drop into the right eye 3 times daily, the day before, the day of and the day after eye injection done every 6 weeks.  . [DISCONTINUED] Insulin Lispro (HUMALOG KWIKPEN) 200 UNIT/ML SOPN Inject 30-36 Units into the skin 3 (three) times daily before meals.    No facility-administered encounter medications on file as of 06/09/2019.      Review of Systems  Review of Systems  Constitutional: Positive for fatigue. Negative for activity change, chills, fever and unexpected weight change.  HENT: Negative for postnasal drip, rhinorrhea, sinus pressure, sinus pain and sore throat.   Eyes: Negative.   Respiratory: Positive for shortness of breath. Negative for cough and wheezing.   Cardiovascular: Negative for chest pain and palpitations.  Gastrointestinal: Negative for diarrhea, nausea and vomiting.  Endocrine: Negative.   Genitourinary: Negative.   Musculoskeletal: Negative.   Skin: Negative.   Neurological: Negative for dizziness and headaches.  Psychiatric/Behavioral: Negative.  Negative for dysphoric mood. The patient is not nervous/anxious.   All other systems reviewed and are negative.    Physical Exam  BP 124/68   Pulse 78   Temp 98.3 F (36.8 C) (Oral)   Ht 6\' 4"  (1.93 m)  Wt (!) 390 lb (176.9 kg)   SpO2 93%   BMI 47.47 kg/m   Wt Readings from Last 5 Encounters:  06/09/19 (!) 390 lb (176.9 kg)  05/26/19 (!) 391 lb 12.8 oz (177.7 kg)  04/28/19 (!) 382 lb 12.8 oz (173.6 kg)  04/18/19 (!) 376 lb (170.6 kg)  03/30/19 (!) 378 lb (171.5 kg)    Physical Exam Vitals signs and nursing note reviewed.  Constitutional:      General: He is not in acute distress.    Appearance: Normal appearance. He is obese.  HENT:     Head: Normocephalic and atraumatic.     Right Ear: Hearing, tympanic membrane, ear canal and external ear normal.     Left Ear: Hearing, tympanic membrane, ear canal and external ear normal.     Nose: Nose normal. No mucosal edema, congestion or rhinorrhea.     Right Turbinates: Not enlarged.     Left Turbinates: Not enlarged.     Mouth/Throat:     Mouth: Mucous membranes are dry.     Pharynx: Oropharynx is clear. No oropharyngeal exudate.  Eyes:     Comments: sunglasses  Neck:     Musculoskeletal: Normal  range of motion.  Cardiovascular:     Rate and Rhythm: Normal rate and regular rhythm.     Pulses: Normal pulses.     Heart sounds: Normal heart sounds. No murmur.  Pulmonary:     Effort: Pulmonary effort is normal. No respiratory distress.     Breath sounds: Normal breath sounds. No decreased breath sounds, wheezing or rales.  Musculoskeletal:     Right lower leg: Edema (2-3+) present.     Left lower leg: Edema (2-3+) present.  Lymphadenopathy:     Cervical: No cervical adenopathy.  Skin:    General: Skin is warm and dry.     Capillary Refill: Capillary refill takes less than 2 seconds.     Findings: No erythema or rash.  Neurological:     General: No focal deficit present.     Mental Status: He is alert and oriented to person, place, and time.     Motor: No weakness.     Coordination: Coordination normal.     Gait: Gait is intact. Gait normal.  Psychiatric:        Mood and Affect: Mood normal.        Behavior: Behavior normal. Behavior is cooperative.        Thought Content: Thought content normal.        Judgment: Judgment normal.      Lab Results:  CBC    Component Value Date/Time   WBC 6.5 05/26/2019 1602   RBC 4.18 (L) 05/26/2019 1602   HGB 12.2 (L) 05/26/2019 1602   HCT 36.6 (L) 05/26/2019 1602   PLT 219 05/26/2019 1602   MCV 87.6 05/26/2019 1602   MCH 29.2 05/26/2019 1602   MCHC 33.3 05/26/2019 1602   RDW 14.5 05/26/2019 1602   LYMPHSABS 1,391 05/26/2019 1602   MONOABS 0.7 02/10/2017 1606   EOSABS 371 05/26/2019 1602   BASOSABS 39 05/26/2019 1602    BMET    Component Value Date/Time   NA 138 05/26/2019 1602   NA 145 (H) 03/21/2019 1505   K 4.5 05/26/2019 1602   CL 102 05/26/2019 1602   CO2 24 05/26/2019 1602   GLUCOSE 348 (H) 05/26/2019 1602   BUN 21 05/26/2019 1602   BUN 22 03/21/2019 1505   CREATININE 1.47 (H) 05/26/2019 1602  CALCIUM 9.2 05/26/2019 1602   GFRNONAA 48 (L) 04/18/2019 0928   GFRNONAA 59 (L) 06/26/2016 1403   GFRAA 56 (L)  04/18/2019 0928   GFRAA 69 06/26/2016 1403    BNP No results found for: BNP  ProBNP    Component Value Date/Time   PROBNP 45 05/26/2019 1602   PROBNP 16.0 02/10/2017 1606      Assessment & Plan:   Cor pulmonale, chronic (HCC) Plan:  Continue forward with cardiology appointment next week  Lasix 40 mg twice daily Continue to weigh yourself daily Close follow-up with our office in 8 weeks with pulmonary function test  COPD (chronic obstructive pulmonary disease) Plan: Continue Trelegy Ellipta Rescue inhaler as needed We will order pulmonary function test to further evaluate breathing needs to be completed at 8-week follow-up appointment Pneumovax 23 vaccine today High-dose flu vaccine today   OSA (obstructive sleep apnea) Plan: Continue CPAP therapy  Healthcare maintenance Pneumovax 23 today Flu vaccine today     Return in about 2 months (around 08/09/2019), or if symptoms worsen or fail to improve, for Follow up with Dr. Elsworth Soho, Follow up for PFT.   Lauraine Rinne, NP 06/09/2019   This appointment was 18 minutes long with over 50% of the time in direct face-to-face patient care, assessment, plan of care, and follow-up.

## 2019-06-09 ENCOUNTER — Ambulatory Visit (INDEPENDENT_AMBULATORY_CARE_PROVIDER_SITE_OTHER): Payer: Medicare Other | Admitting: Pulmonary Disease

## 2019-06-09 ENCOUNTER — Encounter: Payer: Self-pay | Admitting: Pulmonary Disease

## 2019-06-09 ENCOUNTER — Other Ambulatory Visit: Payer: Self-pay

## 2019-06-09 VITALS — BP 124/68 | HR 78 | Temp 98.3°F | Ht 76.0 in | Wt 390.0 lb

## 2019-06-09 DIAGNOSIS — Z0000 Encounter for general adult medical examination without abnormal findings: Secondary | ICD-10-CM | POA: Diagnosis not present

## 2019-06-09 DIAGNOSIS — J449 Chronic obstructive pulmonary disease, unspecified: Secondary | ICD-10-CM | POA: Diagnosis not present

## 2019-06-09 DIAGNOSIS — I2781 Cor pulmonale (chronic): Secondary | ICD-10-CM

## 2019-06-09 DIAGNOSIS — G4733 Obstructive sleep apnea (adult) (pediatric): Secondary | ICD-10-CM | POA: Diagnosis not present

## 2019-06-09 DIAGNOSIS — Z23 Encounter for immunization: Secondary | ICD-10-CM

## 2019-06-09 NOTE — Assessment & Plan Note (Signed)
Plan: Continue Trelegy Ellipta Rescue inhaler as needed We will order pulmonary function test to further evaluate breathing needs to be completed at 8-week follow-up appointment Pneumovax 23 vaccine today High-dose flu vaccine today

## 2019-06-09 NOTE — Assessment & Plan Note (Signed)
Plan:  Continue forward with cardiology appointment next week  Lasix 40 mg twice daily Continue to weigh yourself daily Close follow-up with our office in 8 weeks with pulmonary function test

## 2019-06-09 NOTE — Assessment & Plan Note (Signed)
Plan: Continue CPAP therapy 

## 2019-06-09 NOTE — Patient Instructions (Addendum)
You were seen today by Lauraine Rinne, NP  for:   1. OSA (obstructive sleep apnea)  We recommend that you continue using your CPAP daily >>>Keep up the hard work using your device >>> Goal should be wearing this for the entire night that you are sleeping, at least 4 to 6 hours  Remember:   Do not drive or operate heavy machinery if tired or drowsy.   Please notify the supply company and office if you are unable to use your device regularly due to missing supplies or machine being broken.   Work on maintaining a healthy weight and following your recommended nutrition plan   Maintain proper daily exercise and movement   Maintaining proper use of your device can also help improve management of other chronic illnesses such as: Blood pressure, blood sugars, and weight management.   BiPAP/ CPAP Cleaning:  >>>Clean weekly, with Dawn soap, and bottle brush.  Set up to air dry.   2. Cor pulmonale, chronic (HCC)  Continue furosemide as prescribed  Continue forward with cardiology consult appointment next week  3. Chronic obstructive pulmonary disease, unspecified COPD type ([place])  Trelegy Ellipta  >>> 1 puff daily in the morning >>>rinse mouth out after use  >>> This inhaler contains 3 medications that help manage her respiratory status, contact our office if you cannot afford this medication or unable to remain on this medication  Only use your albuterol as a rescue medication to be used if you can't catch your breath by resting or doing a relaxed purse lip breathing pattern.  - The less you use it, the better it will work when you need it. - Ok to use up to 2 puffs  every 4 hours if you must but call for immediate appointment if use goes up over your usual need - Don't leave home without it !!  (think of it like the spare tire for your car)   We will order pulmonary function testing to further evaluate shortness of breath over the next 6 to 8 weeks  4. Healthcare  maintenance  Pneumovax 23 today Flu vaccine today    We recommend today:  Orders Placed This Encounter  Procedures   Pulmonary function test    Standing Status:   Future    Standing Expiration Date:   06/08/2020    Order Specific Question:   Where should this test be performed?    Answer:   [facility]    Order Specific Question:   Full PFT: includes the following: basic spirometry, spirometry pre & post bronchodilator, diffusion capacity (DLCO), lung volumes    Answer:   Full PFT   Orders Placed This Encounter  Procedures   Pulmonary function test   No orders of the defined types were placed in this encounter.   Follow Up:    Return in about 2 months (around 08/09/2019), or if symptoms worsen or fail to improve, for Follow up with Dr. Elsworth Soho, Follow up for PFT.   Please do your part to reduce the spread of COVID-19:      Reduce your risk of any infection  and COVID19 by using the similar precautions used for avoiding the common cold or flu:   Wash your hands often with soap and warm water for at least 20 seconds.  If soap and water are not readily available, use an alcohol-based hand sanitizer with at least 60% alcohol.   If coughing or sneezing, cover your mouth and nose by coughing or  sneezing into the elbow areas of your shirt or coat, into a tissue or into your sleeve (not your hands).  WEAR A MASK when in public   Avoid shaking hands with others and consider head nods or verbal greetings only.  Avoid touching your eyes, nose, or mouth with unwashed hands.   Avoid close contact with people who are sick.  Avoid places or events with large numbers of people in one location, like concerts or sporting events.  If you have some symptoms but not all symptoms, continue to monitor at home and seek medical attention if your symptoms worsen.  If you are having a medical emergency, call 911.   [place] /  e-Visit: [email or web address]         [facility]: [facility]: [phone]                   [facility]: [phone]     It is flu season:   >>> Best ways to protect herself from the flu: Receive the yearly flu vaccine, practice good hand hygiene washing with soap and also using hand sanitizer when available, eat a nutritious meals, get adequate rest, hydrate appropriately   Please contact the office if your symptoms worsen or you have concerns that you are not improving.   Thank you for choosing [facility] for your healthcare, and for allowing [place] to partner with you on your healthcare journey. I am thankful to be able to provide care to you today.   Wyn Quaker FNP-C   Influenza Virus Vaccine injection What is this medicine? INFLUENZA VIRUS VACCINE (in floo EN zuh VAHY ruhs vak SEEN) helps to reduce the risk of getting influenza also known as the flu. The vaccine only helps protect you against some strains of the flu. This medicine may be used for other purposes; ask your health care provider or pharmacist if you have questions. COMMON BRAND NAME(S): Afluria, Afluria Quadrivalent, Agriflu, Alfuria, FLUAD, Fluarix, Fluarix Quadrivalent, Flublok, Flublok Quadrivalent, FLUCELVAX, Flulaval, Fluvirin, Fluzone, Fluzone High-Dose, Fluzone Intradermal What should I tell my health care provider before I take this medicine? They need to know if you have any of these conditions:  bleeding disorder like hemophilia  fever or infection  Guillain-Barre syndrome or other neurological problems  immune system problems  infection with the human immunodeficiency virus (HIV) or AIDS  low blood platelet counts  multiple sclerosis  an unusual or allergic reaction to influenza virus vaccine, latex, other medicines, foods, dyes, or preservatives. Different brands of vaccines contain  different allergens. Some may contain latex or eggs. Talk to your doctor about your allergies to make sure that you get the right vaccine.  pregnant or trying to get pregnant  breast-feeding How should I use this medicine? This vaccine is for injection into a muscle or under the skin. It is given by a health care professional. A copy of Vaccine Information Statements will be given before each vaccination. Read this sheet carefully each time. The sheet may change frequently. Talk to your healthcare provider to see which vaccines are right for you. Some vaccines should not be used in all age groups. Overdosage: If you think you have taken too much of this medicine contact a poison control center or emergency room at once. NOTE: This medicine is only for you. Do not share this medicine with others. What if I miss a dose? This does  not apply. What may interact with this medicine?  chemotherapy or radiation therapy  medicines that lower your immune system like etanercept, anakinra, infliximab, and adalimumab  medicines that treat or prevent blood clots like warfarin  phenytoin  steroid medicines like prednisone or cortisone  theophylline  vaccines This list may not describe all possible interactions. Give your health care provider a list of all the medicines, herbs, non-prescription drugs, or dietary supplements you use. Also tell them if you smoke, drink alcohol, or use illegal drugs. Some items may interact with your medicine. What should I watch for while using this medicine? Report any side effects that do not go away within 3 days to your doctor or health care professional. Call your health care provider if any unusual symptoms occur within 6 weeks of receiving this vaccine. You may still catch the flu, but the illness is not usually as bad. You cannot get the flu from the vaccine. The vaccine will not protect against colds or other illnesses that may cause fever. The vaccine is needed  every year. What side effects may I notice from receiving this medicine? Side effects that you should report to your doctor or health care professional as soon as possible:  allergic reactions like skin rash, itching or hives, swelling of the face, lips, or tongue Side effects that usually do not require medical attention (report to your doctor or health care professional if they continue or are bothersome):  fever  headache  muscle aches and pains  pain, tenderness, redness, or swelling at the injection site  tiredness This list may not describe all possible side effects. Call your doctor for medical advice about side effects. You may report side effects to FDA at 1-800-FDA-1088. Where should I keep my medicine? The vaccine will be given by a health care professional in a clinic, pharmacy, doctor's office, or other health care setting. You will not be given vaccine doses to store at home. NOTE: This sheet is a summary. It may not cover all possible information. If you have questions about this medicine, talk to your doctor, pharmacist, or health care provider.  2020 Elsevier/Gold Standard (2018-08-16 08:45:43)   Pneumococcal Vaccine, Polyvalent solution for injection What is this medicine? PNEUMOCOCCAL VACCINE, POLYVALENT (NEU mo KOK al vak SEEN, pol ee VEY luhnt) is a vaccine to prevent pneumococcus bacteria infection. These bacteria are a major cause of ear infections, Strep throat infections, and serious pneumonia, meningitis, or blood infections worldwide. These vaccines help the body to produce antibodies (protective substances) that help your body defend against these bacteria. This vaccine is recommended for people 49 years of age and older with health problems. It is also recommended for all adults over 54 years old. This vaccine will not treat an infection. This medicine may be used for other purposes; ask your health care provider or pharmacist if you have questions. COMMON  BRAND NAME(S): Pneumovax 23 What should I tell my health care provider before I take this medicine? They need to know if you have any of these conditions:  bleeding problems  bone marrow or organ transplant  cancer, Hodgkin's disease  fever  infection  immune system problems  low platelet count in the blood  seizures  an unusual or allergic reaction to pneumococcal vaccine, diphtheria toxoid, other vaccines, latex, other medicines, foods, dyes, or preservatives  pregnant or trying to get pregnant  breast-feeding How should I use this medicine? This vaccine is for injection into a muscle or under the skin.  It is given by a health care professional. A copy of Vaccine Information Statements will be given before each vaccination. Read this sheet carefully each time. The sheet may change frequently. Talk to your pediatrician regarding the use of this medicine in children. While this drug may be prescribed for children as young as 2 years of age for selected conditions, precautions do apply. Overdosage: If you think you have taken too much of this medicine contact a poison control center or emergency room at once. NOTE: This medicine is only for you. Do not share this medicine with others. What if I miss a dose? It is important not to miss your dose. Call your doctor or health care professional if you are unable to keep an appointment. What may interact with this medicine?  medicines for cancer chemotherapy  medicines that suppress your immune function  medicines that treat or prevent blood clots like warfarin, enoxaparin, and dalteparin  steroid medicines like prednisone or cortisone This list may not describe all possible interactions. Give your health care provider a list of all the medicines, herbs, non-prescription drugs, or dietary supplements you use. Also tell them if you smoke, drink alcohol, or use illegal drugs. Some items may interact with your medicine. What should I  watch for while using this medicine? Mild fever and pain should go away in 3 days or less. Report any unusual symptoms to your doctor or health care professional. What side effects may I notice from receiving this medicine? Side effects that you should report to your doctor or health care professional as soon as possible:  allergic reactions like skin rash, itching or hives, swelling of the face, lips, or tongue  breathing problems  confused  fever over 102 degrees F  pain, tingling, numbness in the hands or feet  seizures  unusual bleeding or bruising  unusual muscle weakness Side effects that usually do not require medical attention (report to your doctor or health care professional if they continue or are bothersome):  aches and pains  diarrhea  fever of 102 degrees F or less  headache  irritable  loss of appetite  pain, tender at site where injected  trouble sleeping This list may not describe all possible side effects. Call your doctor for medical advice about side effects. You may report side effects to FDA at 1-800-FDA-1088. Where should I keep my medicine? This does not apply. This vaccine is given in a clinic, pharmacy, doctor's office, or other health care setting and will not be stored at home. NOTE: This sheet is a summary. It may not cover all possible information. If you have questions about this medicine, talk to your doctor, pharmacist, or health care provider.  2020 Elsevier/Gold Standard (2008-04-27 14:32:37)

## 2019-06-09 NOTE — Assessment & Plan Note (Signed)
Pneumovax 23 today Flu vaccine today

## 2019-06-09 NOTE — Addendum Note (Signed)
Addended by: Valerie Salts on: 06/09/2019 02:14 PM   Modules accepted: Orders

## 2019-06-14 ENCOUNTER — Encounter: Payer: Self-pay | Admitting: Cardiology

## 2019-06-14 NOTE — Progress Notes (Signed)
Patient referred by [name], MD for shortness of breath  Subjective:   Kenneth Patrick, male    DOB: 20-Apr-1951, 68 y.o.   MRN: [id number]   Chief Complaint  Patient presents with  . Shortness of Breath  . Hypertension  . New Patient (Initial Visit)    HPI  68 year old Caucasian male with hypertension, hyperlipidemia, type 2 diabetes mellitus, former smoker, COPD, OSA, morbid obesity, referred for evaluation of shortness of breath.  Patient states about pulmonology for management of COPD and OSA.  He has had worsening dyspnea on exertion over the last few months.  proBNP and d-dimer were normal on recent checks.  Patient also underwent echocardiogram recently, details below.  He has not noted significant improvement in his symptoms with furosemide.  While patient's BMI is 47, he states that he has "always been heavy", but shortness of breath has only worsened over the past 1 year.  He describes right-sided chest pain on exertion, improved with rest.  He is compliant with his medical therapy.  Blood pressure is elevated today.  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  He has had COPD/asthma for several years.  His former 30 pack year smoker, quit smoking in 2006.   Past Medical History:  Diagnosis Date  . Cancer ([place])    basal cell   . COPD (chronic obstructive pulmonary disease) ([facility])    uses inhalers  . Depression   . Diabetic neuropathy ([place])   . DM2 (diabetes mellitus, type 2) ([facility])    insulin dependent  . Dyspnea    with exertion  . Hypercholesterolemia   . Low back pain    no meds  . Respiratory infection   . Sleep apnea    uses CPAP     Past Surgical History:  Procedure Laterality Date  . APPLICATION OF A-CELL OF HEAD/NECK Left 11/01/2018   Procedure: APPLICATION OF A-CELL TO SCALP WOUND;  Surgeon: Wallace Going, DO;  Location: [facility];  Service: Plastics;  Laterality: Left;  . CATARACT  EXTRACTION W/PHACO Right 04/21/2019   Procedure: CATARACT EXTRACTION PHACO AND INTRAOCULAR LENS PLACEMENT RIGHT EYE;  Surgeon: Baruch Goldmann, MD;  Location: [facility];  Service: Ophthalmology;  Laterality: Right;  right  . CHOLECYSTECTOMY    . DEBRIDEMENT AND CLOSURE WOUND Left 11/01/2018   Procedure: Debridement scalp wound;  Surgeon: Wallace Going, DO;  Location: [facility];  Service: Plastics;  Laterality: Left;  . left eye enucleation following trauma       Social History   Socioeconomic History  . Marital status: Married    Spouse name: Not on file  . Number of children: 2  . Years of education: Not on file  . Highest education level: Not on file  Occupational History  . Occupation: retired Engineer, structural  Social Needs  . Financial resource strain: Not on file  . Food insecurity    Worry: Not on file    Inability: Not on file  . Transportation needs    Medical: Not on file    Non-medical: Not on file  Tobacco Use  . Smoking status: Former Smoker    Packs/day: 1.50    Years: 20.00    Pack years: 30.00    Start date: 1965    Quit date: 10/05/2004    Years since quitting: 14.6  . Smokeless tobacco: Never Used  Substance and Sexual Activity  . Alcohol use: No  Alcohol/week: 0.0 standard drinks  . Drug use: No    Comment: stopped smoking marijuana in 2006  . Sexual activity: Not on file  Lifestyle  . Physical activity    Days per week: Not on file    Minutes per session: Not on file  . Stress: Not on file  Relationships  . Social Herbalist on phone: Not on file    Gets together: Not on file    Attends religious service: Not on file    Active member of club or organization: Not on file    Attends meetings of clubs or organizations: Not on file    Relationship status: Not on file  . Intimate partner violence    Fear of current or ex partner: Not on file    Emotionally abused: Not on file    Physically abused: Not on file    Forced  sexual activity: Not on file  Other Topics Concern  . Not on file  Social History Narrative  . Not on file     Family History  Problem Relation Age of Onset  . Leukemia Father   . Diabetes Mother      Current Outpatient Medications on File Prior to Visit  Medication Sig Dispense Refill  . albuterol (VENTOLIN HFA) 108 (90 BASE) MCG/ACT inhaler Inhale 2 puffs into the lungs every 6 (six) hours as needed for wheezing or shortness of breath.     [name] atorvastatin (LIPITOR) 80 MG tablet Take 80 mg by mouth at bedtime.     . Continuous Blood Gluc Sensor (FREESTYLE LIBRE SENSOR SYSTEM) MISC Use one sensor every 10 days. 3 each 2  . Fluticasone-Umeclidin-Vilant (TRELEGY ELLIPTA) 100-62.5-25 MCG/INH AEPB Inhale 1 puff into the lungs daily. 60 each 11  . furosemide (LASIX) 40 MG tablet Take 40 mg by mouth daily as needed for fluid.     [name] gabapentin (NEURONTIN) 300 MG capsule Take 300 mg by mouth 2 (two) times daily.     [name] HUMALOG KWIKPEN 200 UNIT/ML SOPN INJECT 30 TO 36 UNITS SUBCUTANEOUSLY THREE TIMES DAILY BEFORE MEAL(S) 30 mL 3  . Insulin Detemir (LEVEMIR FLEXTOUCH) 100 UNIT/ML Pen Inject 80 Units into the skin at bedtime. 10 pen 2  . liraglutide (VICTOZA) 18 MG/3ML SOPN INJECT 1.8MG  SUBCUTANEOUSLY DAILY (Patient taking differently: Inject 1.8 mg into the skin daily. ) 27 mL 0  . oxymetazoline (AFRIN) 0.05 % nasal spray Place 1-2 sprays into both nostrils 2 (two) times daily as needed for congestion.     . potassium chloride SA (KLOR-CON M20) 20 MEQ tablet Take 1 tablet (20 mEq total) by mouth daily. (Patient taking differently: Take 20 mEq by mouth daily as needed (when taking furosemide). ) 90 tablet 1  . Semaglutide,0.25 or 0.5MG /DOS, (OZEMPIC, 0.25 OR 0.5 MG/DOSE,) 2 MG/1.5ML SOPN Inject 0.5 mg into the skin once a week. 3 mL 2  . sertraline (ZOLOFT) 100 MG tablet Take 200 mg by mouth daily.     [name] tobramycin (TOBREX) 0.3 % ophthalmic solution Place 1 drop into the right eye See admin  instructions. Instill 1 drop into the right eye 3 times daily, the day before, the day of and the day after eye injection done every 6 weeks.     No current facility-administered medications on file prior to visit.     Cardiovascular studies:  EKG 06/15/2019: Sinus rhythm 86 bpm. Early R wave transition. Leftward axis.  No significant change compared to previous EKG.  Echocardiogram  05/31/2019:  1. The left ventricle has normal systolic function with an ejection fraction of 60-65%. The cavity size was normal. There is mild concentric left ventricular hypertrophy. Left ventricular diastolic Doppler parameters are consistent with impaired  relaxation. Elevated left ventricular end-diastolic pressure No evidence of left ventricular regional wall motion abnormalities.  2. The right ventricle has normal systolic function. The cavity was normal. There is no increase in right ventricular wall thickness.  3. The mitral valve is grossly normal.  4. The tricuspid valve is grossly normal.  5. The aortic valve is tricuspid.  6. The aorta is normal unless otherwise noted.  7. The inferior vena cava was dilated in size with >50% respiratory variability.  8. The interatrial septum appears to be lipomatous.   Recent labs: Results for Gulley, Marley A "MIKE" (MRN [id number]) as of 06/15/2019 08:45  Ref. Range 05/26/2019 16:02  Sodium Latest Ref Range: 135 - 146 mmol/L 138  Potassium Latest Ref Range: 3.5 - 5.3 mmol/L 4.5  Chloride Latest Ref Range: 98 - 110 mmol/L 102  CO2 Latest Ref Range: 20 - 32 mmol/L 24  Glucose Latest Ref Range: 65 - 99 mg/dL 348 (H)  BUN Latest Ref Range: 7 - 25 mg/dL 21  Creatinine Latest Ref Range: 0.70 - 1.25 mg/dL 1.47 (H)  Calcium Latest Ref Range: 8.6 - 10.3 mg/dL 9.2  BUN/Creatinine Ratio Latest Ref Range: 6 - 22 (calc) 14  AG Ratio Latest Ref Range: 1.0 - 2.5 (calc) 1.8  AST Latest Ref Range: 10 - 35 U/L 67 (H)  ALT Latest Ref Range: 9 - 46 U/L 42  Total Protein  Latest Ref Range: 6.1 - 8.1 g/dL 6.4  Total Bilirubin Latest Ref Range: 0.2 - 1.2 mg/dL 1.2  NT-Pro BNP Latest Ref Range: 0 - 376 pg/mL 45   Results for Boggess, Giavanni A "MIKE" (MRN [id number]) as of 06/15/2019 08:45  Ref. Range 05/26/2019 16:02  WBC Latest Ref Range: 3.8 - 10.8 Thousand/uL 6.5  RBC Latest Ref Range: 4.20 - 5.80 Million/uL 4.18 (L)  Hemoglobin Latest Ref Range: 13.2 - 17.1 g/dL 12.2 (L)  HCT Latest Ref Range: 38.5 - 50.0 % 36.6 (L)  MCV Latest Ref Range: 80.0 - 100.0 fL 87.6  MCH Latest Ref Range: 27.0 - 33.0 pg 29.2  MCHC Latest Ref Range: 32.0 - 36.0 g/dL 33.3  RDW Latest Ref Range: 11.0 - 15.0 % 14.5  Platelets Latest Ref Range: 140 - 400 Thousand/uL 219  MPV Latest Ref Range: 7.5 - 12.5 fL 9.9   Results for Ojala, Elchonon A "MIKE" (MRN [id number]) as of 06/15/2019 08:45  Ref. Range 05/26/2019 16:02  D-Dimer, Quant Latest Ref Range: <0.50 mcg/mL FEU 0.40   Results for Luth, Simeon A "MIKE" (MRN [id number]) as of 06/15/2019 08:45  Ref. Range 03/21/2019 15:05 04/18/2019 09:28 05/26/2019 16:02  Glucose Latest Ref Range: 65 - 99 mg/dL 188 (H) 212 (H) 348 (H)  Hemoglobin A1C Latest Ref Range: 4.8 - 5.6 % 8.7 (H) 8.4 (H)   TSH Latest Ref Range: 0.450 - 4.500 uIU/mL 1.380    T4,Free(Direct) Latest Ref Range: 0.82 - 1.77 ng/dL 1.09      Review of Systems  Constitution: Negative for decreased appetite, malaise/fatigue, weight gain and weight loss.  HENT: Negative for congestion.   Eyes: Negative for visual disturbance.  Cardiovascular: Positive for chest pain, dyspnea on exertion and leg swelling. Negative for palpitations and syncope.  Respiratory: Positive for shortness of breath. Negative for cough.   Endocrine: Negative for  cold intolerance.  Hematologic/Lymphatic: Does not bruise/bleed easily.  Skin: Negative for itching and rash.  Musculoskeletal: Negative for myalgias.  Gastrointestinal: Negative for abdominal pain, nausea and vomiting.  Genitourinary: Negative for  dysuria.  Neurological: Negative for dizziness and weakness.  Psychiatric/Behavioral: The patient is not nervous/anxious.   All other systems reviewed and are negative.       Vitals:   06/15/19 0913 06/15/19 0954  BP: (!) 151/76 (!) 147/74  Pulse: 90 79  Temp: 98.6 F (37 C)   SpO2: 90%      Body mass index is 47.11 kg/m. Filed Weights   06/15/19 0913  Weight: (!) 175.5 kg     Objective:   Physical Exam  Constitutional: He is oriented to person, place, and time. He appears well-developed and well-nourished. No distress.  Morbidly obese  HENT:  Head: Normocephalic and atraumatic.  Eyes: Pupils are equal, round, and reactive to light. Conjunctivae are normal.  Neck: No JVD present.  Cardiovascular: Normal rate, regular rhythm and intact distal pulses.  No murmur heard. Pulmonary/Chest: Effort normal and breath sounds normal. He has no wheezes. He has no rales.  Abdominal: Soft. Bowel sounds are normal. He exhibits distension (Protuberant abdomen). There is no rebound.  Musculoskeletal:        General: Edema (1-2+ bilateral) present.  Lymphadenopathy:    He has no cervical adenopathy.  Neurological: He is alert and oriented to person, place, and time. No cranial nerve deficit.  Skin: Skin is warm and dry.  Psychiatric: He has a normal mood and affect.  Nursing note and vitals reviewed.         Assessment & Recommendations:   68 year old Caucasian male with hypertension, hyperlipidemia, type 2 diabetes mellitus, former smoker, COPD, OSA, obesity, referred for evaluation of shortness of breath.  Shortness of breath: Likely multifactorial.  I personally reviewed his echocardiogram performed on 05/31/2019.  He has normal systolic function, grade 1 diastolic function, no severe valvular abnormalities, no pulmonary hypertension, normal RV systolic function.  His echocardiogram does not explain the degree of shortness of breath he is experiencing.  Morbid obesity,  deconditioning, obstructive sleep apnea, COPD certainly contributing to her shortness of breath.  In addition, angina equivalent due to coronary artery disease cannot be excluded.  I discussed the options for investigation as well as medical and invasive management with the patient.  Given his BMI of 47, he has high probability of false positive abnormal stress test, even if performed over 2 days.  Nonetheless, patient is reluctant to pursue any invasive management at this time.  I will hold off stress test.  I will start him on medical therapy with diltiazem 120 mg daily and spironolactone 25 mg daily for antianginal, antihypertensive, as well as diuretic properties.  If symptoms do not improve in spite of improvement and blood pressure control and diuresis, will then consider further ischemia evaluation.  Check BMP in 2 weeks.  Follow-up in 3 weeks.  Hypertension: Uncontrolled.  Medications as above.  Type 2 diabetes mellitus: Uncontrolled, with CKD 3.  Managed by endocrinologist Dr. Dorris Fetch.  COPD, OSA: Continue management as per pulmonology.  This encounter required review of medical records, complex decision making and extensive counseling.  Thank you for referring the patient to [place]. Please feel free to contact with any questions.  Nigel Mormon, MD [facility] Pager: [phone] Office: [phone] If no answer Cell [phone]

## 2019-06-15 ENCOUNTER — Other Ambulatory Visit: Payer: Self-pay

## 2019-06-15 ENCOUNTER — Encounter: Payer: Self-pay | Admitting: Cardiology

## 2019-06-15 ENCOUNTER — Ambulatory Visit (INDEPENDENT_AMBULATORY_CARE_PROVIDER_SITE_OTHER): Payer: Medicare Other | Admitting: Cardiology

## 2019-06-15 VITALS — BP 147/74 | HR 79 | Temp 98.6°F | Ht 76.0 in | Wt 387.0 lb

## 2019-06-15 DIAGNOSIS — IMO0002 Reserved for concepts with insufficient information to code with codable children: Secondary | ICD-10-CM

## 2019-06-15 DIAGNOSIS — J449 Chronic obstructive pulmonary disease, unspecified: Secondary | ICD-10-CM

## 2019-06-15 DIAGNOSIS — R0609 Other forms of dyspnea: Secondary | ICD-10-CM

## 2019-06-15 DIAGNOSIS — R0602 Shortness of breath: Secondary | ICD-10-CM

## 2019-06-15 DIAGNOSIS — R079 Chest pain, unspecified: Secondary | ICD-10-CM

## 2019-06-15 DIAGNOSIS — I10 Essential (primary) hypertension: Secondary | ICD-10-CM

## 2019-06-15 DIAGNOSIS — E118 Type 2 diabetes mellitus with unspecified complications: Secondary | ICD-10-CM | POA: Diagnosis not present

## 2019-06-15 DIAGNOSIS — E1165 Type 2 diabetes mellitus with hyperglycemia: Secondary | ICD-10-CM

## 2019-06-15 MED ORDER — SPIRONOLACTONE 25 MG PO TABS: 25.0000 mg | ORAL_TABLET | Freq: Every day | ORAL | 2 refills | Status: DC

## 2019-06-15 MED ORDER — DILTIAZEM HCL ER COATED BEADS 120 MG PO CP24: 120.0000 mg | ORAL_CAPSULE | Freq: Every day | ORAL | 2 refills | Status: DC

## 2019-06-21 ENCOUNTER — Telehealth: Payer: Medicare Other | Admitting: Nutrition

## 2019-07-03 ENCOUNTER — Other Ambulatory Visit (HOSPITAL_COMMUNITY): Payer: Self-pay | Admitting: Cardiology

## 2019-07-03 DIAGNOSIS — R0609 Other forms of dyspnea: Secondary | ICD-10-CM | POA: Diagnosis not present

## 2019-07-04 LAB — BASIC METABOLIC PANEL
BUN/Creatinine Ratio: 12 (ref 10–24)
BUN: 18 mg/dL (ref 8–27)
CO2: 25 mmol/L (ref 20–29)
Calcium: 9.2 mg/dL (ref 8.6–10.2)
Chloride: 102 mmol/L (ref 96–106)
Creatinine, Ser: 1.49 mg/dL — ABNORMAL HIGH (ref 0.76–1.27)
GFR calc Af Amer: 55 mL/min/{1.73_m2} — ABNORMAL LOW (ref >59)
GFR calc non Af Amer: 48 mL/min/{1.73_m2} — ABNORMAL LOW (ref >59)
Glucose: 94 mg/dL (ref 65–99)
Potassium: 4.1 mmol/L (ref 3.5–5.2)
Sodium: 142 mmol/L (ref 134–144)

## 2019-07-10 ENCOUNTER — Other Ambulatory Visit: Payer: Self-pay

## 2019-07-10 ENCOUNTER — Ambulatory Visit (INDEPENDENT_AMBULATORY_CARE_PROVIDER_SITE_OTHER): Payer: Medicare Other | Admitting: Cardiology

## 2019-07-10 ENCOUNTER — Ambulatory Visit: Payer: Medicare Other | Admitting: Cardiology

## 2019-07-10 ENCOUNTER — Encounter: Payer: Self-pay | Admitting: Cardiology

## 2019-07-10 VITALS — BP 146/76 | HR 79 | Temp 98.2°F | Ht 76.0 in | Wt 381.0 lb

## 2019-07-10 DIAGNOSIS — J449 Chronic obstructive pulmonary disease, unspecified: Secondary | ICD-10-CM

## 2019-07-10 DIAGNOSIS — E1165 Type 2 diabetes mellitus with hyperglycemia: Secondary | ICD-10-CM | POA: Diagnosis not present

## 2019-07-10 DIAGNOSIS — I10 Essential (primary) hypertension: Secondary | ICD-10-CM

## 2019-07-10 DIAGNOSIS — R0609 Other forms of dyspnea: Secondary | ICD-10-CM

## 2019-07-10 DIAGNOSIS — R0600 Dyspnea, unspecified: Secondary | ICD-10-CM

## 2019-07-10 DIAGNOSIS — E118 Type 2 diabetes mellitus with unspecified complications: Secondary | ICD-10-CM | POA: Diagnosis not present

## 2019-07-10 DIAGNOSIS — IMO0002 Reserved for concepts with insufficient information to code with codable children: Secondary | ICD-10-CM

## 2019-07-10 NOTE — Progress Notes (Signed)
Patient referred by [name], MD for shortness of breath  Subjective:   Kenneth Patrick, male    DOB: 22-Apr-1951, 68 y.o.   MRN: [id number]   Chief Complaint  Patient presents with  . Shortness of Breath  . Follow-up    3 weeks  . Results    lab    HPI  68 year old Caucasian male with hypertension, hyperlipidemia, type 2 diabetes mellitus, former smoker, COPD, OSA, obesity, referred for evaluation of shortness of breath.  I felt that his shortness of breath was likely multifactorial.  I personally reviewed his echocardiogram performed on 05/31/2019.  He has normal systolic function, grade 1 diastolic function, no severe valvular abnormalities, no pulmonary hypertension, normal RV systolic function.  His echocardiogram does not explain the degree of shortness of breath he is experiencing.  Morbid obesity, deconditioning, obstructive sleep apnea, COPD certainly contributing to her shortness of breath.  In addition, angina equivalent due to coronary artery disease cannot be excluded.  I discussed the options for investigation as well as medical and invasive management with the patient.  Given his BMI of 47, he has high probability of false positive abnormal stress test, even if performed over 2 days.  Nonetheless, patient is reluctant to pursue any invasive management at this time.  Thus, I did not recommend stress test.  I started him on medical therapy with diltiazem 120 mg daily and spironolactone 25 mg daily for antianginal, antihypertensive, as well as diuretic properties.    He has tolerated spironolactone well without any significant change to his BMP. He has noticed improvement in his exertional dyspnea. He has lost 6 lbs. BP is lower at home, according to the patient.    Past Medical History:  Diagnosis Date  . Cancer ([place])    basal cell   . COPD (chronic obstructive pulmonary disease) ([facility])    uses inhalers  . Depression   . Diabetic neuropathy ([place])   . DM2 (diabetes  mellitus, type 2) ([facility])    insulin dependent  . Dyspnea    with exertion  . Hypercholesterolemia   . Low back pain    no meds  . Respiratory infection   . Sleep apnea    uses CPAP     Past Surgical History:  Procedure Laterality Date  . APPLICATION OF A-CELL OF HEAD/NECK Left 11/01/2018   Procedure: APPLICATION OF A-CELL TO SCALP WOUND;  Surgeon: Wallace Going, DO;  Location: [facility];  Service: Plastics;  Laterality: Left;  . CATARACT EXTRACTION W/PHACO Right 04/21/2019   Procedure: CATARACT EXTRACTION PHACO AND INTRAOCULAR LENS PLACEMENT RIGHT EYE;  Surgeon: Baruch Goldmann, MD;  Location: [facility];  Service: Ophthalmology;  Laterality: Right;  right  . CHOLECYSTECTOMY    . DEBRIDEMENT AND CLOSURE WOUND Left 11/01/2018   Procedure: Debridement scalp wound;  Surgeon: Wallace Going, DO;  Location: [facility];  Service: Plastics;  Laterality: Left;  . left eye enucleation following trauma       Social History   Socioeconomic History  . Marital status: Married    Spouse name: Not on file  . Number of children: 2  . Years of education: Not on file  . Highest education level: Not on file  Occupational History  . Occupation: retired Engineer, structural  Social Needs  . Financial resource strain: Not on file  . Food insecurity    Worry: Not on file    Inability: Not on file  . Transportation  needs    Medical: Not on file    Non-medical: Not on file  Tobacco Use  . Smoking status: Former Smoker    Packs/day: 1.50    Years: 20.00    Pack years: 30.00    Start date: 1965    Quit date: 10/05/2004    Years since quitting: 14.7  . Smokeless tobacco: Never Used  Substance and Sexual Activity  . Alcohol use: No    Alcohol/week: 0.0 standard drinks  . Drug use: No    Comment: stopped smoking marijuana in 2006  . Sexual activity: Not on file  Lifestyle  . Physical activity    Days per week: Not on file    Minutes per session: Not on file   . Stress: Not on file  Relationships  . Social Herbalist on phone: Not on file    Gets together: Not on file    Attends religious service: Not on file    Active member of club or organization: Not on file    Attends meetings of clubs or organizations: Not on file    Relationship status: Not on file  . Intimate partner violence    Fear of current or ex partner: Not on file    Emotionally abused: Not on file    Physically abused: Not on file    Forced sexual activity: Not on file  Other Topics Concern  . Not on file  Social History Narrative  . Not on file     Family History  Problem Relation Age of Onset  . Leukemia Father   . Diabetes Mother      Current Outpatient Medications on File Prior to Visit  Medication Sig Dispense Refill  . albuterol (VENTOLIN HFA) 108 (90 BASE) MCG/ACT inhaler Inhale 2 puffs into the lungs every 6 (six) hours as needed for wheezing or shortness of breath.     [name] atorvastatin (LIPITOR) 80 MG tablet Take 80 mg by mouth at bedtime.     . Continuous Blood Gluc Sensor (FREESTYLE LIBRE SENSOR SYSTEM) MISC Use one sensor every 10 days. 3 each 2  . diltiazem (CARDIZEM CD) 120 MG 24 hr capsule Take 1 capsule (120 mg total) by mouth daily. 30 capsule 2  . Fluticasone-Umeclidin-Vilant (TRELEGY ELLIPTA) 100-62.5-25 MCG/INH AEPB Inhale 1 puff into the lungs daily. 60 each 11  . furosemide (LASIX) 40 MG tablet Take 40 mg by mouth daily as needed for fluid.     [name] gabapentin (NEURONTIN) 300 MG capsule Take 300 mg by mouth 2 (two) times daily.     [name] HUMALOG KWIKPEN 200 UNIT/ML SOPN INJECT 30 TO 36 UNITS SUBCUTANEOUSLY THREE TIMES DAILY BEFORE MEAL(S) 30 mL 3  . Insulin Detemir (LEVEMIR FLEXTOUCH) 100 UNIT/ML Pen Inject 80 Units into the skin at bedtime. 10 pen 2  . liraglutide (VICTOZA) 18 MG/3ML SOPN INJECT 1.8MG  SUBCUTANEOUSLY DAILY (Patient taking differently: Inject 1.8 mg into the skin daily. ) 27 mL 0  . oxymetazoline (AFRIN) 0.05 % nasal spray  Place 1-2 sprays into both nostrils 2 (two) times daily as needed for congestion.     . Semaglutide,0.25 or 0.5MG /DOS, (OZEMPIC, 0.25 OR 0.5 MG/DOSE,) 2 MG/1.5ML SOPN Inject 0.5 mg into the skin once a week. 3 mL 2  . sertraline (ZOLOFT) 100 MG tablet Take 200 mg by mouth daily.     [name] spironolactone (ALDACTONE) 25 MG tablet Take 1 tablet (25 mg total) by mouth daily. 30 tablet 2  . tobramycin (TOBREX)  0.3 % ophthalmic solution Place 1 drop into the right eye See admin instructions. Instill 1 drop into the right eye 3 times daily, the day before, the day of and the day after eye injection done every 6 weeks.     No current facility-administered medications on file prior to visit.     Cardiovascular studies:  EKG 06/15/2019: Sinus rhythm 86 bpm. Early R wave transition. Leftward axis.  No significant change compared to previous EKG.  Echocardiogram 05/31/2019:  1. The left ventricle has normal systolic function with an ejection fraction of 60-65%. The cavity size was normal. There is mild concentric left ventricular hypertrophy. Left ventricular diastolic Doppler parameters are consistent with impaired  relaxation. Elevated left ventricular end-diastolic pressure No evidence of left ventricular regional wall motion abnormalities.  2. The right ventricle has normal systolic function. The cavity was normal. There is no increase in right ventricular wall thickness.  3. The mitral valve is grossly normal.  4. The tricuspid valve is grossly normal.  5. The aortic valve is tricuspid.  6. The aorta is normal unless otherwise noted.  7. The inferior vena cava was dilated in size with >50% respiratory variability.  8. The interatrial septum appears to be lipomatous.   Recent labs: Results for Protzman, Chon A "MIKE" (MRN [id number]) as of 07/10/2019 09:44  Ref. Range 05/26/2019 16:02 07/03/2019 13:16  Sodium Latest Ref Range: 134 - 144 mmol/L 138 142  Potassium Latest Ref Range: 3.5 - 5.2 mmol/L 4.5  4.1  Chloride Latest Ref Range: 96 - 106 mmol/L 102 102  CO2 Latest Ref Range: 20 - 29 mmol/L 24 25  Glucose Latest Ref Range: 65 - 99 mg/dL 348 (H) 94  BUN Latest Ref Range: 8 - 27 mg/dL 21 18  Creatinine Latest Ref Range: 0.76 - 1.27 mg/dL 1.47 (H) 1.49 (H)  Calcium Latest Ref Range: 8.6 - 10.2 mg/dL 9.2 9.2  BUN/Creatinine Ratio Latest Ref Range: 10 - 24  14 12   AG Ratio Latest Ref Range: 1.0 - 2.5 (calc) 1.8   AST Latest Ref Range: 10 - 35 U/L 67 (H)   ALT Latest Ref Range: 9 - 46 U/L 42   Total Protein Latest Ref Range: 6.1 - 8.1 g/dL 6.4   Total Bilirubin Latest Ref Range: 0.2 - 1.2 mg/dL 1.2   GFR, Est Non African American Latest Ref Range: >59 mL/min/1.73  48 (L)  GFR, Est African American Latest Ref Range: >59 mL/min/1.73  55 (L)   Review of Systems  Constitution: Negative for decreased appetite, malaise/fatigue, weight gain and weight loss.  HENT: Negative for congestion.   Eyes: Negative for visual disturbance.  Cardiovascular: Positive for chest pain, dyspnea on exertion and leg swelling. Negative for palpitations and syncope.  Respiratory: Positive for shortness of breath. Negative for cough.   Endocrine: Negative for cold intolerance.  Hematologic/Lymphatic: Does not bruise/bleed easily.  Skin: Negative for itching and rash.  Musculoskeletal: Negative for myalgias.  Gastrointestinal: Negative for abdominal pain, nausea and vomiting.  Genitourinary: Negative for dysuria.  Neurological: Negative for dizziness and weakness.  Psychiatric/Behavioral: The patient is not nervous/anxious.   All other systems reviewed and are negative.       Vitals:   07/10/19 1325  BP: (!) 146/76  Pulse: 79  Temp: 98.2 F (36.8 C)  SpO2: 95%     Body mass index is 46.38 kg/m. Filed Weights   07/10/19 1325  Weight: (!) 381 lb (172.8 kg)     Objective:  Physical Exam  Constitutional: He is oriented to person, place, and time. He appears well-developed and well-nourished. No  distress.  Morbidly obese  HENT:  Head: Normocephalic and atraumatic.  Eyes: Pupils are equal, round, and reactive to light. Conjunctivae are normal.  Neck: No JVD present.  Cardiovascular: Normal rate, regular rhythm and intact distal pulses.  No murmur heard. Pulmonary/Chest: Effort normal and breath sounds normal. He has no wheezes. He has no rales.  Abdominal: Soft. Bowel sounds are normal. He exhibits distension (Protuberant abdomen). There is no rebound.  Musculoskeletal:        General: Edema (1-2+ bilateral) present.  Lymphadenopathy:    He has no cervical adenopathy.  Neurological: He is alert and oriented to person, place, and time. No cranial nerve deficit.  Skin: Skin is warm and dry.  Psychiatric: He has a normal mood and affect.  Nursing note and vitals reviewed.         Assessment & Recommendations:   69 year old Caucasian male with hypertension, hyperlipidemia, type 2 diabetes mellitus, former smoker, COPD, OSA, obesity, referred for evaluation of shortness of breath.   Shortness of breath: Likely multifactorial.  I personally reviewed his echocardiogram performed on 05/31/2019.  He has normal systolic function, grade 1 diastolic function, no severe valvular abnormalities, no pulmonary hypertension, normal RV systolic function.  His echocardiogram does not explain the degree of shortness of breath he is experiencing.  Morbid obesity, deconditioning, obstructive sleep apnea, COPD certainly contributing to her shortness of breath.  In addition, angina equivalent due to coronary artery disease cannot be excluded.  I discussed the options for investigation as well as medical and invasive management with the patient.  Given his BMI of 47, he has high probability of false positive abnormal stress test, even if performed over 2 days.  Nonetheless, patient is reluctant to pursue any invasive management at this time. Thus, do not recommend stress test at this time.   Continue  medical management at this time with diltiazem 120 mg daily and spironolactone 25 mg daily for antianginal, antihypertensive, as well as diuretic properties. I will see him back in 3 months.   Hypertension: Uncontrolled.  Medications as above.  Type 2 diabetes mellitus: Uncontrolled, with CKD 3.  Managed by endocrinologist Dr. Dorris Fetch.  COPD, OSA: Continue management as per pulmonology.   Nigel Mormon, MD [facility] Pager: [phone] Office: [phone] If no answer Cell [phone]

## 2019-07-26 ENCOUNTER — Other Ambulatory Visit: Payer: Self-pay | Admitting: "Endocrinology

## 2019-07-26 DIAGNOSIS — E1165 Type 2 diabetes mellitus with hyperglycemia: Secondary | ICD-10-CM | POA: Diagnosis not present

## 2019-07-26 DIAGNOSIS — E118 Type 2 diabetes mellitus with unspecified complications: Secondary | ICD-10-CM | POA: Diagnosis not present

## 2019-07-26 DIAGNOSIS — Z794 Long term (current) use of insulin: Secondary | ICD-10-CM | POA: Diagnosis not present

## 2019-07-27 LAB — COMPREHENSIVE METABOLIC PANEL
ALT: 57 IU/L — ABNORMAL HIGH (ref 0–44)
AST: 83 IU/L — ABNORMAL HIGH (ref 0–40)
Albumin/Globulin Ratio: 1.9 (ref 1.2–2.2)
Albumin: 4.4 g/dL (ref 3.8–4.8)
Alkaline Phosphatase: 151 IU/L — ABNORMAL HIGH (ref 39–117)
BUN/Creatinine Ratio: 12 (ref 10–24)
BUN: 19 mg/dL (ref 8–27)
Bilirubin Total: 1.2 mg/dL (ref 0.0–1.2)
CO2: 25 mmol/L (ref 20–29)
Calcium: 9.5 mg/dL (ref 8.6–10.2)
Chloride: 101 mmol/L (ref 96–106)
Creatinine, Ser: 1.53 mg/dL — ABNORMAL HIGH (ref 0.76–1.27)
GFR calc Af Amer: 53 mL/min/{1.73_m2} — ABNORMAL LOW (ref >59)
GFR calc non Af Amer: 46 mL/min/{1.73_m2} — ABNORMAL LOW (ref >59)
Globulin, Total: 2.3 g/dL (ref 1.5–4.5)
Glucose: 102 mg/dL — ABNORMAL HIGH (ref 65–99)
Potassium: 4.7 mmol/L (ref 3.5–5.2)
Sodium: 141 mmol/L (ref 134–144)
Total Protein: 6.7 g/dL (ref 6.0–8.5)

## 2019-07-27 LAB — HGB A1C W/O EAG: Hgb A1c MFr Bld: 9.3 % — ABNORMAL HIGH (ref 4.8–5.6)

## 2019-08-03 ENCOUNTER — Ambulatory Visit (INDEPENDENT_AMBULATORY_CARE_PROVIDER_SITE_OTHER): Payer: Medicare Other | Admitting: "Endocrinology

## 2019-08-03 ENCOUNTER — Other Ambulatory Visit: Payer: Self-pay

## 2019-08-03 ENCOUNTER — Encounter: Payer: Self-pay | Admitting: "Endocrinology

## 2019-08-03 DIAGNOSIS — E1165 Type 2 diabetes mellitus with hyperglycemia: Secondary | ICD-10-CM

## 2019-08-03 DIAGNOSIS — IMO0002 Reserved for concepts with insufficient information to code with codable children: Secondary | ICD-10-CM

## 2019-08-03 DIAGNOSIS — E118 Type 2 diabetes mellitus with unspecified complications: Secondary | ICD-10-CM

## 2019-08-03 DIAGNOSIS — I10 Essential (primary) hypertension: Secondary | ICD-10-CM | POA: Diagnosis not present

## 2019-08-03 DIAGNOSIS — E782 Mixed hyperlipidemia: Secondary | ICD-10-CM

## 2019-08-03 DIAGNOSIS — Z794 Long term (current) use of insulin: Secondary | ICD-10-CM | POA: Diagnosis not present

## 2019-08-03 MED ORDER — GLIPIZIDE ER 5 MG PO TB24: 5.0000 mg | ORAL_TABLET | Freq: Every day | ORAL | 3 refills | Status: DC

## 2019-08-03 MED ORDER — LEVEMIR FLEXTOUCH 100 UNIT/ML ~~LOC~~ SOPN: 50.0000 [IU] | PEN_INJECTOR | Freq: Two times a day (BID) | SUBCUTANEOUS | 2 refills | Status: DC

## 2019-08-03 NOTE — Progress Notes (Signed)
08/03/2019                                                    Endocrinology Telehealth Visit Follow up Note -During COVID -19 Pandemic  This visit type was conducted due to national recommendations for restrictions regarding the COVID-19 Pandemic  in an effort to limit this patient's exposure and mitigate transmission of the corona virus.  Due to his co-morbid illnesses, DIARRA KOS is at  moderate to high risk for complications without adequate follow up.  This format is felt to be most appropriate for him at this time.  I connected with this patient on 08/03/2019   by telephone and verified that I am speaking with the correct person using two identifiers. Cathlean Marseilles, [birth year]. he has verbally consented to this visit. All issues noted in this document were discussed and addressed. The format was not optimal for physical exam.     Subjective:    Patient ID: Cathlean Marseilles, male    DOB: April 05, 1951, PCP [name], MD   Past Medical History:  Diagnosis Date  . Cancer ([place])    basal cell   . COPD (chronic obstructive pulmonary disease) ([facility])    uses inhalers  . Depression   . Diabetic neuropathy ([place])   . DM2 (diabetes mellitus, type 2) ([facility])    insulin dependent  . Dyspnea    with exertion  . Hypercholesterolemia   . Low back pain    no meds  . Respiratory infection   . Sleep apnea    uses CPAP   Past Surgical History:  Procedure Laterality Date  . APPLICATION OF A-CELL OF HEAD/NECK Left 11/01/2018   Procedure: APPLICATION OF A-CELL TO SCALP WOUND;  Surgeon: Wallace Going, DO;  Location: [facility];  Service: Plastics;  Laterality: Left;  . CATARACT EXTRACTION W/PHACO Right 04/21/2019   Procedure: CATARACT EXTRACTION PHACO AND INTRAOCULAR LENS PLACEMENT RIGHT EYE;  Surgeon: Baruch Goldmann, MD;  Location: [facility];  Service: Ophthalmology;  Laterality: Right;  right  . CHOLECYSTECTOMY    . DEBRIDEMENT AND CLOSURE WOUND Left 11/01/2018   Procedure:  Debridement scalp wound;  Surgeon: Wallace Going, DO;  Location: [facility];  Service: Plastics;  Laterality: Left;  . left eye enucleation following trauma     Social History   Socioeconomic History  . Marital status: Married    Spouse name: Not on file  . Number of children: 2  . Years of education: Not on file  . Highest education level: Not on file  Occupational History  . Occupation: retired Engineer, structural  Social Needs  . Financial resource strain: Not on file  . Food insecurity    Worry: Not on file    Inability: Not on file  . Transportation needs    Medical: Not on file    Non-medical: Not on file  Tobacco Use  . Smoking status: Former Smoker    Packs/day: 1.50    Years: 20.00    Pack years: 30.00    Start date: 1965    Quit date: 10/05/2004    Years since quitting: 14.8  . Smokeless tobacco: Never Used  Substance and Sexual Activity  . Alcohol use: No    Alcohol/week: 0.0 standard drinks  . Drug use: No  Comment: stopped smoking marijuana in 2006  . Sexual activity: Not on file  Lifestyle  . Physical activity    Days per week: Not on file    Minutes per session: Not on file  . Stress: Not on file  Relationships  . Social Herbalist on phone: Not on file    Gets together: Not on file    Attends religious service: Not on file    Active member of club or organization: Not on file    Attends meetings of clubs or organizations: Not on file    Relationship status: Not on file  Other Topics Concern  . Not on file  Social History Narrative  . Not on file   Outpatient Encounter Medications as of 08/03/2019  Medication Sig  . albuterol (VENTOLIN HFA) 108 (90 BASE) MCG/ACT inhaler Inhale 2 puffs into the lungs every 6 (six) hours as needed for wheezing or shortness of breath.   [name] atorvastatin (LIPITOR) 80 MG tablet Take 80 mg by mouth at bedtime.   . Continuous Blood Gluc Sensor (FREESTYLE LIBRE SENSOR SYSTEM) MISC Use one  sensor every 10 days.  [name] diltiazem (CARDIZEM CD) 120 MG 24 hr capsule Take 1 capsule (120 mg total) by mouth daily.  . Fluticasone-Umeclidin-Vilant (TRELEGY ELLIPTA) 100-62.5-25 MCG/INH AEPB Inhale 1 puff into the lungs daily.  . furosemide (LASIX) 40 MG tablet Take 40 mg by mouth daily as needed for fluid.   [name] gabapentin (NEURONTIN) 300 MG capsule Take 300 mg by mouth 2 (two) times daily.   [name] glipiZIDE (GLUCOTROL XL) 5 MG 24 hr tablet Take 1 tablet (5 mg total) by mouth daily with breakfast.  . HUMALOG KWIKPEN 200 UNIT/ML SOPN INJECT 30 TO 36 UNITS SUBCUTANEOUSLY THREE TIMES DAILY BEFORE MEAL(S)  . Insulin Detemir (LEVEMIR FLEXTOUCH) 100 UNIT/ML Pen Inject 50 Units into the skin 2 (two) times daily.  [name] liraglutide (VICTOZA) 18 MG/3ML SOPN INJECT 1.8MG SUBCUTANEOUSLY DAILY (Patient taking differently: Inject 1.8 mg into the skin daily. )  . oxymetazoline (AFRIN) 0.05 % nasal spray Place 1-2 sprays into both nostrils 2 (two) times daily as needed for congestion.   . sertraline (ZOLOFT) 100 MG tablet Take 200 mg by mouth daily.   [name] spironolactone (ALDACTONE) 25 MG tablet Take 1 tablet (25 mg total) by mouth daily.  [name] tobramycin (TOBREX) 0.3 % ophthalmic solution Place 1 drop into the right eye See admin instructions. Instill 1 drop into the right eye 3 times daily, the day before, the day of and the day after eye injection done every 6 weeks.  . [DISCONTINUED] Insulin Detemir (LEVEMIR FLEXTOUCH) 100 UNIT/ML Pen Inject 80 Units into the skin at bedtime.  . [DISCONTINUED] Semaglutide,0.25 or 0.5MG/DOS, (OZEMPIC, 0.25 OR 0.5 MG/DOSE,) 2 MG/1.5ML SOPN Inject 0.5 mg into the skin once a week. (Patient not taking: Reported on 07/10/2019)   No facility-administered encounter medications on file as of 08/03/2019.    ALLERGIES: Allergies  Allergen Reactions  . Penicillins Swelling and Rash    Did it involve swelling of the face/tongue/throat, SOB, or low BP? Yes Did it involve sudden or severe rash/hives,  skin peeling, or any reaction on the inside of your mouth or nose? No Did you need to seek medical attention at a hospital or doctor's office? Yes When did it last happen?Childhood allergy If all above answers are "NO", may proceed with cephalosporin use.     VACCINATION STATUS: Immunization History  Administered Date(s) Administered  . Fluad Quad(high  Dose 65+) 06/09/2019  . Influenza Split 10/23/2011  . Influenza Whole 09/04/2010  . Influenza,inj,Quad PF,6+ Mos 06/08/2013, 09/03/2014, 07/31/2015, 06/22/2016, 06/29/2018  . Pneumococcal Conjugate-13 03/05/2015  . Pneumococcal Polysaccharide-23 05/14/2011, 11/27/2013, 06/09/2019    Diabetes He presents for his follow-up diabetic visit. He has type 2 diabetes mellitus. Onset time: He was diagnosed at approximate age of 24 years. His disease course has been worsening. There are no hypoglycemic associated symptoms. Pertinent negatives for hypoglycemia include no confusion, headaches, pallor or seizures. Associated symptoms include polydipsia and polyuria. Pertinent negatives for diabetes include no chest pain, no fatigue, no polyphagia and no weakness. There are no hypoglycemic complications. Symptoms are worsening. There are no diabetic complications. Risk factors for coronary artery disease include diabetes mellitus, dyslipidemia, hypertension, male sex, obesity, sedentary lifestyle and tobacco exposure. Current diabetic treatment includes insulin injections. He is compliant with treatment most of the time. His weight is fluctuating minimally. He is following a generally unhealthy diet. When asked about meal planning, he reported none. He has had a previous visit with a dietitian. He never participates in exercise. His home blood glucose trend is decreasing steadily. His breakfast blood glucose range is generally >200 mg/dl. His lunch blood glucose range is generally >200 mg/dl. His dinner blood glucose range is generally >200 mg/dl. His  bedtime blood glucose range is generally >200 mg/dl. His overall blood glucose range is >200 mg/dl. Eye exam is current.  Hyperlipidemia This is a chronic problem. The current episode started more than 1 year ago. The problem is uncontrolled. Exacerbating diseases include diabetes and obesity. Pertinent negatives include no chest pain, myalgias or shortness of breath. Current antihyperlipidemic treatment includes statins. Compliance problems include adherence to diet and adherence to exercise.  Risk factors for coronary artery disease include dyslipidemia, diabetes mellitus, obesity, male sex and a sedentary lifestyle.  Hypertension This is a chronic problem. The current episode started more than 1 year ago. The problem is controlled. Pertinent negatives include no chest pain, headaches, neck pain, palpitations or shortness of breath. Risk factors for coronary artery disease include diabetes mellitus, dyslipidemia, male gender, obesity, smoking/tobacco exposure and sedentary lifestyle. Compliance problems include diet and exercise.     Objective:    There were no vitals taken for this visit.  Wt Readings from Last 3 Encounters:  07/10/19 (!) 381 lb (172.8 kg)  06/15/19 (!) 387 lb (175.5 kg)  06/09/19 (!) 390 lb (176.9 kg)     Diabetic Labs (most recent): Lab Results  Component Value Date   HGBA1C 9.3 (H) 07/26/2019   HGBA1C 8.4 (H) 04/18/2019   HGBA1C 8.7 (H) 03/21/2019   Recent Results (from the past 2160 hour(s))  Comp Met (CMET)     Status: Abnormal   Collection Time: 05/26/19  4:02 PM  Result Value Ref Range   Glucose, Bld 348 (H) 65 - 99 mg/dL    Comment: .            Fasting reference interval . For someone without known diabetes, a glucose value >125 mg/dL indicates that they may have diabetes and this should be confirmed with a follow-up test. .    BUN 21 7 - 25 mg/dL   Creat 1.47 (H) 0.70 - 1.25 mg/dL    Comment: For patients >15 years of age, the reference limit for  Creatinine is approximately 13% higher for people identified as African-American. .    BUN/Creatinine Ratio 14 6 - 22 (calc)   Sodium 138 135 - 146 mmol/L  Potassium 4.5 3.5 - 5.3 mmol/L   Chloride 102 98 - 110 mmol/L   CO2 24 20 - 32 mmol/L   Calcium 9.2 8.6 - 10.3 mg/dL   Total Protein 6.4 6.1 - 8.1 g/dL   Albumin 4.1 3.6 - 5.1 g/dL   Globulin 2.3 1.9 - 3.7 g/dL (calc)   AG Ratio 1.8 1.0 - 2.5 (calc)   Total Bilirubin 1.2 0.2 - 1.2 mg/dL   Alkaline phosphatase (APISO) 115 35 - 144 U/L   AST 67 (H) 10 - 35 U/L   ALT 42 9 - 46 U/L  CBC with Differential/Platelet     Status: Abnormal   Collection Time: 05/26/19  4:02 PM  Result Value Ref Range   WBC 6.5 3.8 - 10.8 Thousand/uL   RBC 4.18 (L) 4.20 - 5.80 Million/uL   Hemoglobin 12.2 (L) 13.2 - 17.1 g/dL   HCT 36.6 (L) 38.5 - 50.0 %   MCV 87.6 80.0 - 100.0 fL   MCH 29.2 27.0 - 33.0 pg   MCHC 33.3 32.0 - 36.0 g/dL   RDW 14.5 11.0 - 15.0 %   Platelets 219 140 - 400 Thousand/uL   MPV 9.9 7.5 - 12.5 fL   Neutro Abs 4,180 1,500 - 7,800 cells/uL   Lymphs Abs 1,391 850 - 3,900 cells/uL   Absolute Monocytes 520 200 - 950 cells/uL   Eosinophils Absolute 371 15 - 500 cells/uL   Basophils Absolute 39 0 - 200 cells/uL   Neutrophils Relative % 64.3 %   Total Lymphocyte 21.4 %   Monocytes Relative 8.0 %   Eosinophils Relative 5.7 %   Basophils Relative 0.6 %  D-Dimer, Quantitative     Status: None   Collection Time: 05/26/19  4:02 PM  Result Value Ref Range   D-Dimer, Quant 0.40 <0.50 mcg/mL FEU    Comment: . The D-Dimer test is used frequently to exclude an acute PE or DVT. In patients with a low to moderate clinical risk assessment and a D-Dimer result <0.50 mcg/mL FEU, the likelihood of a PE or DVT is very low. However, a thromboembolic event should not be excluded solely on the basis of the D-Dimer level. Increased levels of D-Dimer are associated with a PE, DVT, DIC, malignancies, inflammation, sepsis, surgery, trauma,  pregnancy, and advancing patient age. [Jama 2006 11:295(2):199-207] . For additional information, please refer to: http://education.[facility].com/faq/FAQ149 (This link is being provided for informational/ educational purposes only) .   Pro b natriuretic peptide (BNP)     Status: None   Collection Time: 05/26/19  4:02 PM  Result Value Ref Range   NT-Pro BNP 45 0 - 376 pg/mL    Comment: The following cut-points have been suggested for the use of proBNP for the diagnostic evaluation of heart failure (HF) in patients with acute dyspnea: Modality                     Age           Optimal Cut                            (years)            Point ------------------------------------------------------ Diagnosis (rule in HF)        <50            450 pg/mL  50 - 75            900 pg/mL                               >75           1800 pg/mL Exclusion (rule out HF)  Age independent     300 pg/mL   Basic metabolic panel     Status: Abnormal   Collection Time: 07/03/19  1:16 PM  Result Value Ref Range   Glucose 94 65 - 99 mg/dL   BUN 18 8 - 27 mg/dL   Creatinine, Ser 1.49 (H) 0.76 - 1.27 mg/dL   GFR calc non Af Amer 48 (L) >59 mL/min/1.73   GFR calc Af Amer 55 (L) >59 mL/min/1.73   BUN/Creatinine Ratio 12 10 - 24   Sodium 142 134 - 144 mmol/L   Potassium 4.1 3.5 - 5.2 mmol/L   Chloride 102 96 - 106 mmol/L   CO2 25 20 - 29 mmol/L   Calcium 9.2 8.6 - 10.2 mg/dL  Comprehensive metabolic panel     Status: Abnormal   Collection Time: 07/26/19  2:45 PM  Result Value Ref Range   Glucose 102 (H) 65 - 99 mg/dL   BUN 19 8 - 27 mg/dL   Creatinine, Ser 1.53 (H) 0.76 - 1.27 mg/dL   GFR calc non Af Amer 46 (L) >59 mL/min/1.73   GFR calc Af Amer 53 (L) >59 mL/min/1.73   BUN/Creatinine Ratio 12 10 - 24   Sodium 141 134 - 144 mmol/L   Potassium 4.7 3.5 - 5.2 mmol/L   Chloride 101 96 - 106 mmol/L   CO2 25 20 - 29 mmol/L   Calcium 9.5 8.6 - 10.2 mg/dL   Total  Protein 6.7 6.0 - 8.5 g/dL   Albumin 4.4 3.8 - 4.8 g/dL   Globulin, Total 2.3 1.5 - 4.5 g/dL   Albumin/Globulin Ratio 1.9 1.2 - 2.2   Bilirubin Total 1.2 0.0 - 1.2 mg/dL   Alkaline Phosphatase 151 (H) 39 - 117 IU/L   AST 83 (H) 0 - 40 IU/L   ALT 57 (H) 0 - 44 IU/L  Hgb A1c w/o eAG     Status: Abnormal   Collection Time: 07/26/19  2:45 PM  Result Value Ref Range   Hgb A1c MFr Bld 9.3 (H) 4.8 - 5.6 %    Comment:          Prediabetes: 5.7 - 6.4          Diabetes: >6.4          Glycemic control for adults with diabetes: <7.0    Lipid Panel     Component Value Date/Time   CHOL 165 07/12/2017 1324   TRIG 100 07/12/2017 1324   HDL 46 07/12/2017 1324   CHOLHDL 4.0 06/26/2016 1403   VLDL 22 06/26/2016 1403   LDLCALC 99 07/12/2017 1324     Assessment & Plan:   1. Uncontrolled type 2 diabetes mellitus with complication, with long-term current use of insulin ([place])  -His diabetes is  complicated by morbid obesity and sleep apnea and patient remains at a high risk for more acute and chronic complications of diabetes which include CAD, CVA, CKD, retinopathy, and neuropathy. These are all discussed in detail with the patient.  Tr[place]on reports persistent, severe hyperglycemia both fasting and post prandial. His a1c is higher at 9.3% increasing from  8.7% .   -  Glucose logs and insulin administration records pertaining to this visit,  to be scanned into patient's records.  Recent labs reviewed.   - I have re-counseled the patient on diet management and weight loss  by adopting a carbohydrate restricted / protein rich  Diet. - he  admits there is a room for improvement in his diet and drink choices. -  Suggestion is made for him to avoid simple carbohydrates  from his diet including Cakes, Sweet Desserts / Pastries, Ice Cream, Soda (diet and regular), Sweet Tea, Candies, Chips, Cookies, Sweet Pastries,  Store Bought Juices, Alcohol in Excess of  1-2 drinks a day, Artificial Sweeteners,  Coffee Creamer, and "Sugar-free" Products. This will help patient to have stable blood glucose profile and potentially avoid unintended weight gain.  - Patient is advised to stick to a routine mealtimes to eat 3 meals  a day and avoid unnecessary snacks ( to snack only to correct hypoglycemia).   - I have approached patient with the following individualized plan to manage diabetes and patient agrees.  -No significant documented or reported hypoglycemia.  He will continue to require intensive treatment with basal/bolus insulin in order for him to achieve and maintain control of diabetes to target. -He is advised to increase Levemir to  50 units BID,  continue Humalog 30 units 3 times daily before meals  for pre-meal BG readings of 90-180m/dl, plus patient specific correction dose of rapid acting insulin for unexpected hyperglycemia above 1512mdl, associated with strict documentation of blood glucose 4 times a day-before meals and at bedtime.  -He is advised to shift some of his carbs from supper to lunch for proper distribution of his caloric exposure across the day.  -Adjustment parameters for hypo and hyperglycemia were given in a written document to patient. -Patient is encouraged to call clinic for blood glucose levels less than 70 or above 300 mg /dl. -He he is advised to continue Victoza 1.8 mg subcutaneously daily.    -Due to moderate renal insufficiency, I advised him to stay off of metformin for now.  - He will benefit from a low dose glipizide therapy. I discussed and started glipizide 61m93mo daily at breakfast.  - Patient specific target  for A1c; LDL, HDL, Triglycerides, and  Waist Circumference were discussed in detail.  2) BP/HTN : he is advised to home monitor blood pressure and report if > 140/90 on 2 separate readings.    He is advised to continue his current blood pressure medications, will be considered for low-dose lisinopril next visit.   3) Lipids/HPL: His recent lipid  panel showed uncontrolled LDL at 99.  He is advised to continue atorvastatin 80 mg p.o. nightly.    4)  Weight/Diet: He is a candidate for weight loss.  He still admits to dietary discretion.   CDE consult in progress, exercise, and carbohydrates information provided.  5) Chronic Care/Health Maintenance:  -Patient is on ACEI/ARB and Statin medications and encouraged to continue to follow up with Ophthalmology, Podiatrist at least yearly or according to recommendations, and advised to  stay away from smoking. I have recommended yearly flu vaccine and pneumonia vaccination at least every 5 years; moderate intensity exercise for up to 150 minutes weekly; and  sleep for at least 7 hours a day.  - I advised patient to maintain close follow up with VyaGlenda ChromanD for primary care needs. - Patient Care Time Today:  40 min, of which >50% was spent in  counseling and the  rest reviewing his  current and  previous labs/studies, previous treatments, his blood glucose readings, and medications' doses and developing a plan for long-term care based on the latest recommendations for standards of care.   Cathlean Marseilles participated in the discussions, expressed understanding, and voiced agreement with the above plans.  All questions were answered to his satisfaction. he is encouraged to contact clinic should he have any questions or concerns prior to his return visit.   Follow up plan: -Return in about 4 weeks (around 08/31/2019) for Follow up with Meter and Logs Only - no Labs, Include 8 log sheets.  Glade Lloyd, MD Phone: [phone]  Fax: [phone]   -  This note was partially dictated with voice recognition software. Similar sounding words can be transcribed inadequately or may not  be corrected upon review.  08/03/2019, 8:47 PM

## 2019-08-09 DIAGNOSIS — H43821 Vitreomacular adhesion, right eye: Secondary | ICD-10-CM | POA: Diagnosis not present

## 2019-08-09 DIAGNOSIS — H31091 Other chorioretinal scars, right eye: Secondary | ICD-10-CM | POA: Diagnosis not present

## 2019-08-09 DIAGNOSIS — E113211 Type 2 diabetes mellitus with mild nonproliferative diabetic retinopathy with macular edema, right eye: Secondary | ICD-10-CM | POA: Diagnosis not present

## 2019-08-09 DIAGNOSIS — Z970 Presence of artificial eye: Secondary | ICD-10-CM | POA: Diagnosis not present

## 2019-08-15 ENCOUNTER — Other Ambulatory Visit: Payer: Self-pay | Admitting: General Surgery

## 2019-08-15 ENCOUNTER — Other Ambulatory Visit: Payer: Self-pay | Admitting: Pulmonary Disease

## 2019-08-15 DIAGNOSIS — R0609 Other forms of dyspnea: Secondary | ICD-10-CM

## 2019-08-15 DIAGNOSIS — J449 Chronic obstructive pulmonary disease, unspecified: Secondary | ICD-10-CM

## 2019-08-15 MED ORDER — TRELEGY ELLIPTA 100-62.5-25 MCG/INH IN AEPB: 1.0000 | INHALATION_SPRAY | Freq: Every day | RESPIRATORY_TRACT | 11 refills | Status: AC

## 2019-09-01 ENCOUNTER — Other Ambulatory Visit: Payer: Self-pay | Admitting: "Endocrinology

## 2019-09-06 ENCOUNTER — Encounter: Payer: Self-pay | Admitting: "Endocrinology

## 2019-09-06 ENCOUNTER — Other Ambulatory Visit: Payer: Self-pay

## 2019-09-06 ENCOUNTER — Ambulatory Visit (INDEPENDENT_AMBULATORY_CARE_PROVIDER_SITE_OTHER): Payer: Medicare Other | Admitting: "Endocrinology

## 2019-09-06 DIAGNOSIS — Z794 Long term (current) use of insulin: Secondary | ICD-10-CM

## 2019-09-06 DIAGNOSIS — E1169 Type 2 diabetes mellitus with other specified complication: Secondary | ICD-10-CM | POA: Diagnosis not present

## 2019-09-06 DIAGNOSIS — I10 Essential (primary) hypertension: Secondary | ICD-10-CM | POA: Diagnosis not present

## 2019-09-06 DIAGNOSIS — E782 Mixed hyperlipidemia: Secondary | ICD-10-CM

## 2019-09-06 DIAGNOSIS — E1165 Type 2 diabetes mellitus with hyperglycemia: Secondary | ICD-10-CM | POA: Diagnosis not present

## 2019-09-06 DIAGNOSIS — IMO0002 Reserved for concepts with insufficient information to code with codable children: Secondary | ICD-10-CM

## 2019-09-06 MED ORDER — GLIPIZIDE 5 MG PO TABS: 5.0000 mg | ORAL_TABLET | Freq: Two times a day (BID) | ORAL | 3 refills | Status: DC

## 2019-09-06 MED ORDER — LEVEMIR FLEXTOUCH 100 UNIT/ML ~~LOC~~ SOPN: 60.0000 [IU] | PEN_INJECTOR | Freq: Two times a day (BID) | SUBCUTANEOUS | 2 refills | Status: DC

## 2019-09-06 NOTE — Progress Notes (Signed)
09/06/2019                                                    Endocrinology Telehealth Visit Follow up Note -During COVID -19 Pandemic  This visit type was conducted due to national recommendations for restrictions regarding the COVID-19 Pandemic  in an effort to limit this patient's exposure and mitigate transmission of the corona virus.  Due to his co-morbid illnesses, Kenneth Patrick is at  moderate to high risk for complications without adequate follow up.  This format is felt to be most appropriate for him at this time.  I connected with this patient on 09/06/2019   by telephone and verified that I am speaking with the correct person using two identifiers. Kenneth Patrick, 25-Aug-1951. he has verbally consented to this visit. All issues noted in this document were discussed and addressed. The format was not optimal for physical exam.     Subjective:    Patient ID: Kenneth Patrick, male    DOB: 1950/11/06, PCP [name], MD   Past Medical History:  Diagnosis Date  . Cancer ([place])    basal cell   . COPD (chronic obstructive pulmonary disease) ([facility])    uses inhalers  . Depression   . Diabetic neuropathy ([place])   . DM2 (diabetes mellitus, type 2) ([facility])    insulin dependent  . Dyspnea    with exertion  . Hypercholesterolemia   . Low back pain    no meds  . Respiratory infection   . Sleep apnea    uses CPAP   Past Surgical History:  Procedure Laterality Date  . APPLICATION OF A-CELL OF HEAD/NECK Left 11/01/2018   Procedure: APPLICATION OF A-CELL TO SCALP WOUND;  Surgeon: Wallace Going, DO;  Location: [facility];  Service: Plastics;  Laterality: Left;  . CATARACT EXTRACTION W/PHACO Right 04/21/2019   Procedure: CATARACT EXTRACTION PHACO AND INTRAOCULAR LENS PLACEMENT RIGHT EYE;  Surgeon: Baruch Goldmann, MD;  Location: [facility];  Service: Ophthalmology;  Laterality: Right;  right  . CHOLECYSTECTOMY    . DEBRIDEMENT AND CLOSURE WOUND Left 11/01/2018   Procedure:  Debridement scalp wound;  Surgeon: Wallace Going, DO;  Location: [facility];  Service: Plastics;  Laterality: Left;  . left eye enucleation following trauma     Social History   Socioeconomic History  . Marital status: Married    Spouse name: Not on file  . Number of children: 2  . Years of education: Not on file  . Highest education level: Not on file  Occupational History  . Occupation: retired Engineer, structural  Social Needs  . Financial resource strain: Not on file  . Food insecurity    Worry: Not on file    Inability: Not on file  . Transportation needs    Medical: Not on file    Non-medical: Not on file  Tobacco Use  . Smoking status: Former Smoker    Packs/day: 1.50    Years: 20.00    Pack years: 30.00    Start date: 1965    Quit date: 10/05/2004    Years since quitting: 14.9  . Smokeless tobacco: Never Used  Substance and Sexual Activity  . Alcohol use: No    Alcohol/week: 0.0 standard drinks  . Drug use: No  Comment: stopped smoking marijuana in 2006  . Sexual activity: Not on file  Lifestyle  . Physical activity    Days per week: Not on file    Minutes per session: Not on file  . Stress: Not on file  Relationships  . Social Herbalist on phone: Not on file    Gets together: Not on file    Attends religious service: Not on file    Active member of club or organization: Not on file    Attends meetings of clubs or organizations: Not on file    Relationship status: Not on file  Other Topics Concern  . Not on file  Social History Narrative  . Not on file   Outpatient Encounter Medications as of 09/06/2019  Medication Sig  . albuterol (VENTOLIN HFA) 108 (90 BASE) MCG/ACT inhaler Inhale 2 puffs into the lungs every 6 (six) hours as needed for wheezing or shortness of breath.   [name] atorvastatin (LIPITOR) 80 MG tablet Take 80 mg by mouth at bedtime.   . Continuous Blood Gluc Sensor (FREESTYLE LIBRE SENSOR SYSTEM) MISC Use one sensor  every 10 days.  [name] diltiazem (CARDIZEM CD) 120 MG 24 hr capsule Take 1 capsule (120 mg total) by mouth daily.  . Fluticasone-Umeclidin-Vilant (TRELEGY ELLIPTA) 100-62.5-25 MCG/INH AEPB Inhale 1 puff into the lungs daily.  . furosemide (LASIX) 40 MG tablet Take 40 mg by mouth daily as needed for fluid.   [name] gabapentin (NEURONTIN) 300 MG capsule Take 300 mg by mouth 2 (two) times daily.   [name] glipiZIDE (GLUCOTROL) 5 MG tablet Take 1 tablet (5 mg total) by mouth 2 (two) times daily before a meal.  . HUMALOG KWIKPEN 200 UNIT/ML SOPN INJECT 30 TO 36 UNITS SUBCUTANEOUSLY THREE TIMES DAILY BEFORE MEAL(S)  . Insulin Detemir (LEVEMIR FLEXTOUCH) 100 UNIT/ML Pen Inject 60 Units into the skin 2 (two) times daily.  [name] liraglutide (VICTOZA) 18 MG/3ML SOPN INJECT 1.8 SUBCUTANEOUSLY ONCE DAILY  . oxymetazoline (AFRIN) 0.05 % nasal spray Place 1-2 sprays into both nostrils 2 (two) times daily as needed for congestion.   . sertraline (ZOLOFT) 100 MG tablet Take 200 mg by mouth daily.   [name] spironolactone (ALDACTONE) 25 MG tablet Take 1 tablet (25 mg total) by mouth daily.  [name] tobramycin (TOBREX) 0.3 % ophthalmic solution Place 1 drop into the right eye See admin instructions. Instill 1 drop into the right eye 3 times daily, the day before, the day of and the day after eye injection done every 6 weeks.  . [DISCONTINUED] glipiZIDE (GLUCOTROL XL) 5 MG 24 hr tablet Take 1 tablet (5 mg total) by mouth daily with breakfast.  . [DISCONTINUED] Insulin Detemir (LEVEMIR FLEXTOUCH) 100 UNIT/ML Pen Inject 50 Units into the skin 2 (two) times daily.   No facility-administered encounter medications on file as of 09/06/2019.    ALLERGIES: Allergies  Allergen Reactions  . Penicillins Swelling and Rash    Did it involve swelling of the face/tongue/throat, SOB, or low BP? Yes Did it involve sudden or severe rash/hives, skin peeling, or any reaction on the inside of your mouth or nose? No Did you need to seek medical attention at a  hospital or doctor's office? Yes When did it last happen?Childhood allergy If all above answers are "NO", may proceed with cephalosporin use.     VACCINATION STATUS: Immunization History  Administered Date(s) Administered  . Fluad Quad(high Dose 65+) 06/09/2019  . Influenza Split 10/23/2011  . Influenza Whole 09/04/2010  .  Influenza,inj,Quad PF,6+ Mos 06/08/2013, 09/03/2014, 07/31/2015, 06/22/2016, 06/29/2018  . Pneumococcal Conjugate-13 03/05/2015  . Pneumococcal Polysaccharide-23 05/14/2011, 11/27/2013, 06/09/2019    Diabetes He presents for his follow-up diabetic visit. He has type 2 diabetes mellitus. Onset time: He was diagnosed at approximate age of 29 years. His disease course has been worsening. There are no hypoglycemic associated symptoms. Pertinent negatives for hypoglycemia include no confusion, headaches, pallor or seizures. Associated symptoms include polydipsia and polyuria. Pertinent negatives for diabetes include no chest pain, no fatigue, no polyphagia and no weakness. There are no hypoglycemic complications. Symptoms are improving. There are no diabetic complications. Risk factors for coronary artery disease include diabetes mellitus, dyslipidemia, hypertension, male sex, obesity, sedentary lifestyle and tobacco exposure. Current diabetic treatment includes insulin injections. He is compliant with treatment most of the time. His weight is fluctuating minimally. He is following a generally unhealthy diet. When asked about meal planning, he reported none. He has had a previous visit with a dietitian. He never participates in exercise. His home blood glucose trend is decreasing steadily. His breakfast blood glucose range is generally 180-200 mg/dl. His dinner blood glucose range is generally >200 mg/dl. His bedtime blood glucose range is generally >200 mg/dl. His overall blood glucose range is >200 mg/dl. Eye exam is current.  Hyperlipidemia This is a chronic problem. The  current episode started more than 1 year ago. The problem is uncontrolled. Exacerbating diseases include diabetes and obesity. Pertinent negatives include no chest pain, myalgias or shortness of breath. Current antihyperlipidemic treatment includes statins. Compliance problems include adherence to diet and adherence to exercise.  Risk factors for coronary artery disease include dyslipidemia, diabetes mellitus, obesity, male sex and a sedentary lifestyle.  Hypertension This is a chronic problem. The current episode started more than 1 year ago. The problem is controlled. Pertinent negatives include no chest pain, headaches, neck pain, palpitations or shortness of breath. Risk factors for coronary artery disease include diabetes mellitus, dyslipidemia, male gender, obesity, smoking/tobacco exposure and sedentary lifestyle. Compliance problems include diet and exercise.     Objective:    There were no vitals taken for this visit.  Wt Readings from Last 3 Encounters:  07/10/19 (!) 381 lb (172.8 kg)  06/15/19 (!) 387 lb (175.5 kg)  06/09/19 (!) 390 lb (176.9 kg)     Diabetic Labs (most recent): Lab Results  Component Value Date   HGBA1C 9.3 (H) 07/26/2019   HGBA1C 8.4 (H) 04/18/2019   HGBA1C 8.7 (H) 03/21/2019   Recent Results (from the past 2160 hour(s))  Basic metabolic panel     Status: Abnormal   Collection Time: 07/03/19  1:16 PM  Result Value Ref Range   Glucose 94 65 - 99 mg/dL   BUN 18 8 - 27 mg/dL   Creatinine, Ser 1.49 (H) 0.76 - 1.27 mg/dL   GFR calc non Af Amer 48 (L) >59 mL/min/1.73   GFR calc Af Amer 55 (L) >59 mL/min/1.73   BUN/Creatinine Ratio 12 10 - 24   Sodium 142 134 - 144 mmol/L   Potassium 4.1 3.5 - 5.2 mmol/L   Chloride 102 96 - 106 mmol/L   CO2 25 20 - 29 mmol/L   Calcium 9.2 8.6 - 10.2 mg/dL  Comprehensive metabolic panel     Status: Abnormal   Collection Time: 07/26/19  2:45 PM  Result Value Ref Range   Glucose 102 (H) 65 - 99 mg/dL   BUN 19 8 - 27 mg/dL    Creatinine, Ser 1.53 (H) 0.76 -  1.27 mg/dL   GFR calc non Af Amer 46 (L) >59 mL/min/1.73   GFR calc Af Amer 53 (L) >59 mL/min/1.73   BUN/Creatinine Ratio 12 10 - 24   Sodium 141 134 - 144 mmol/L   Potassium 4.7 3.5 - 5.2 mmol/L   Chloride 101 96 - 106 mmol/L   CO2 25 20 - 29 mmol/L   Calcium 9.5 8.6 - 10.2 mg/dL   Total Protein 6.7 6.0 - 8.5 g/dL   Albumin 4.4 3.8 - 4.8 g/dL   Globulin, Total 2.3 1.5 - 4.5 g/dL   Albumin/Globulin Ratio 1.9 1.2 - 2.2   Bilirubin Total 1.2 0.0 - 1.2 mg/dL   Alkaline Phosphatase 151 (H) 39 - 117 IU/L   AST 83 (H) 0 - 40 IU/L   ALT 57 (H) 0 - 44 IU/L  Hgb A1c w/o eAG     Status: Abnormal   Collection Time: 07/26/19  2:45 PM  Result Value Ref Range   Hgb A1c MFr Bld 9.3 (H) 4.8 - 5.6 %    Comment:          Prediabetes: 5.7 - 6.4          Diabetes: >6.4          Glycemic control for adults with diabetes: <7.0    Lipid Panel     Component Value Date/Time   CHOL 165 07/12/2017 1324   TRIG 100 07/12/2017 1324   HDL 46 07/12/2017 1324   CHOLHDL 4.0 06/26/2016 1403   VLDL 22 06/26/2016 1403   LDLCALC 99 07/12/2017 1324     Assessment & Plan:   1. Uncontrolled type 2 diabetes mellitus with complication, with long-term current use of insulin ([place])  -His diabetes is  complicated by morbid obesity and sleep apnea and patient remains at a high risk for more acute and chronic complications of diabetes which include CAD, CVA, CKD, retinopathy, and neuropathy. These are all discussed in detail with the patient.  Arnol reports persistent, severe hyperglycemia both fasting and post prandial. He still eats late breakfast, skips lunch, eats dinner late.  He has seen some improvement in his fasting glycemic profile. His a1c is higher at 9.3% increasing from  8.7% .   -  Glucose logs and insulin administration records pertaining to this visit,  to be scanned into patient's records.  Recent labs reviewed.   - I have re-counseled the patient on diet  management and weight loss  by adopting a carbohydrate restricted / protein rich  Diet.   - he  admits there is a room for improvement in his diet and drink choices. -  Suggestion is made for him to avoid simple carbohydrates  from his diet including Cakes, Sweet Desserts / Pastries, Ice Cream, Soda (diet and regular), Sweet Tea, Candies, Chips, Cookies, Sweet Pastries,  Store Bought Juices, Alcohol in Excess of  1-2 drinks a day, Artificial Sweeteners, Coffee Creamer, and "Sugar-free" Products. This will help patient to have stable blood glucose profile and potentially avoid unintended weight gain.  - Patient is advised to stick to a routine mealtimes to eat 3 meals  a day and avoid unnecessary snacks ( to snack only to correct hypoglycemia).   - I have approached patient with the following individualized plan to manage diabetes and patient agrees.  -Reports some mild improvement in his fasting glycemic profile, no hypoglycemia.   -  He will continue to require intensive treatment with basal/bolus insulin in order for him to achieve and  maintain control of diabetes to target. -He is advised to increase Levemir to 60 units twice daily,  continue Humalog 30 units 3 times daily before meals  for pre-meal BG readings of 90-150mg /dl, plus patient specific correction dose of rapid acting insulin for unexpected hyperglycemia above 150mg /dl, associated with strict documentation of blood glucose 4 times a day-before meals and at bedtime.  -He is advised to shift some of his carbs from supper to lunch for proper distribution of his caloric exposure across the day.  -Adjustment parameters for hypo and hyperglycemia were given in a written document to patient. -Patient is encouraged to call clinic for blood glucose levels less than 70 or above 300 mg /dl. -He he is advised to continue Victoza 1.8 mg subcutaneously daily.    -Due to moderate renal insufficiency, I advised him to stay off of metformin for  now.  - He has shown some benefit from glipizide treatment.  I discussed and increased his glipizide to 5 mg twice daily with breakfast and with supper.     - Patient specific target  for A1c; LDL, HDL, Triglycerides, and  Waist Circumference were discussed in detail.  2) BP/HTN :  he is advised to home monitor blood pressure and report if > 140/90 on 2 separate readings.     He is advised to continue his current blood pressure medications, will be considered for low-dose lisinopril next visit.   3) Lipids/HPL: His recent lipid panel showed uncontrolled LDL at 99.  He is advised to continue atorvastatin 80 mg p.o. nightly.    4)  Weight/Diet: He is a candidate for weight loss.  He still admits to dietary discretion.   CDE consult in progress, exercise, and carbohydrates information provided.  5) Chronic Care/Health Maintenance:  -Patient is on ACEI/ARB and Statin medications and encouraged to continue to follow up with Ophthalmology, Podiatrist at least yearly or according to recommendations, and advised to  stay away from smoking. I have recommended yearly flu vaccine and pneumonia vaccination at least every 5 years; moderate intensity exercise for up to 150 minutes weekly; and  sleep for at least 7 hours a day.  - I advised patient to maintain close follow up with [name], MD for primary care needs.  - he  admits there is a room for improvement in his diet and drink choices. -  Suggestion is made for him to avoid simple carbohydrates  from his diet including Cakes, Sweet Desserts / Pastries, Ice Cream, Soda (diet and regular), Sweet Tea, Candies, Chips, Cookies, Sweet Pastries,  Store Bought Juices, Alcohol in Excess of  1-2 drinks a day, Artificial Sweeteners, Coffee Creamer, and "Sugar-free" Products. This will help patient to have stable blood glucose profile and potentially avoid unintended weight gain.    Follow up plan: -Return in about 3 months (around 12/05/2019) for Bring  Meter and Logs- A1c in Office, Include 8 log sheets.  Glade Lloyd, MD Phone: [phone]  Fax: [phone]   -  This note was partially dictated with voice recognition software. Similar sounding words can be transcribed inadequately or may not  be corrected upon review.  09/06/2019, 5:13 PM

## 2019-09-13 DIAGNOSIS — Z0000 Encounter for general adult medical examination without abnormal findings: Secondary | ICD-10-CM | POA: Diagnosis not present

## 2019-09-13 DIAGNOSIS — Z1339 Encounter for screening examination for other mental health and behavioral disorders: Secondary | ICD-10-CM | POA: Diagnosis not present

## 2019-09-13 DIAGNOSIS — Z1331 Encounter for screening for depression: Secondary | ICD-10-CM | POA: Diagnosis not present

## 2019-09-13 DIAGNOSIS — E7800 Pure hypercholesterolemia, unspecified: Secondary | ICD-10-CM | POA: Diagnosis not present

## 2019-09-13 DIAGNOSIS — R0600 Dyspnea, unspecified: Secondary | ICD-10-CM | POA: Diagnosis not present

## 2019-09-13 DIAGNOSIS — Z7189 Other specified counseling: Secondary | ICD-10-CM | POA: Diagnosis not present

## 2019-09-13 DIAGNOSIS — Z6841 Body Mass Index (BMI) 40.0 and over, adult: Secondary | ICD-10-CM | POA: Diagnosis not present

## 2019-09-13 DIAGNOSIS — Z1211 Encounter for screening for malignant neoplasm of colon: Secondary | ICD-10-CM | POA: Diagnosis not present

## 2019-09-13 DIAGNOSIS — Z79899 Other long term (current) drug therapy: Secondary | ICD-10-CM | POA: Diagnosis not present

## 2019-09-13 DIAGNOSIS — R5383 Other fatigue: Secondary | ICD-10-CM | POA: Diagnosis not present

## 2019-09-13 DIAGNOSIS — Z299 Encounter for prophylactic measures, unspecified: Secondary | ICD-10-CM | POA: Diagnosis not present

## 2019-09-13 DIAGNOSIS — Z125 Encounter for screening for malignant neoplasm of prostate: Secondary | ICD-10-CM | POA: Diagnosis not present

## 2019-09-17 ENCOUNTER — Other Ambulatory Visit: Payer: Self-pay | Admitting: Cardiology

## 2019-09-17 DIAGNOSIS — R079 Chest pain, unspecified: Secondary | ICD-10-CM

## 2019-09-17 DIAGNOSIS — R0609 Other forms of dyspnea: Secondary | ICD-10-CM

## 2019-10-12 ENCOUNTER — Other Ambulatory Visit: Payer: Self-pay

## 2019-10-12 ENCOUNTER — Ambulatory Visit (INDEPENDENT_AMBULATORY_CARE_PROVIDER_SITE_OTHER): Payer: Medicare Other | Admitting: Cardiology

## 2019-10-12 ENCOUNTER — Encounter: Payer: Self-pay | Admitting: Cardiology

## 2019-10-12 VITALS — BP 144/78 | HR 83 | Temp 98.7°F | Ht 76.0 in | Wt 380.0 lb

## 2019-10-12 DIAGNOSIS — J449 Chronic obstructive pulmonary disease, unspecified: Secondary | ICD-10-CM | POA: Diagnosis not present

## 2019-10-12 DIAGNOSIS — R0609 Other forms of dyspnea: Secondary | ICD-10-CM

## 2019-10-12 NOTE — Progress Notes (Signed)
Patient referred by Kenneth Chroman, MD for shortness of breath  Subjective:   Kenneth Patrick, male    DOB: 11-07-1950, 69 y.o.   MRN: [id number]   Chief Complaint  Patient presents with  . Shortness of Breath  . Follow-up    HPI  69 year old Caucasian male with hypertension, hyperlipidemia, type 2 diabetes mellitus, former smoker, COPD, OSA, obesity, shortness of breath.  His exertional dyspnea persists, with no significant change since last visit. Again, he denies any chest pain. He has cut down his calorie intake and is trying to lose weight. He states tha this blood sugard have improved.    Current Outpatient Medications on File Prior to Visit  Medication Sig Dispense Refill  . albuterol (VENTOLIN HFA) 108 (90 BASE) MCG/ACT inhaler Inhale 2 puffs into the lungs every 6 (six) hours as needed for wheezing or shortness of breath.     [name] atorvastatin (LIPITOR) 80 MG tablet Take 80 mg by mouth at bedtime.     [name] CARTIA XT 120 MG 24 hr capsule Take 1 capsule by mouth once daily 30 capsule 0  . Continuous Blood Gluc Sensor (FREESTYLE LIBRE SENSOR SYSTEM) MISC Use one sensor every 10 days. 3 each 2  . Fluticasone-Umeclidin-Vilant (TRELEGY ELLIPTA) 100-62.5-25 MCG/INH AEPB Inhale 1 puff into the lungs daily. 60 each 11  . furosemide (LASIX) 40 MG tablet Take 40 mg by mouth daily as needed for fluid.     [name] gabapentin (NEURONTIN) 300 MG capsule Take 300 mg by mouth 2 (two) times daily.     [name] glipiZIDE (GLUCOTROL) 5 MG tablet Take 1 tablet (5 mg total) by mouth 2 (two) times daily before a meal. 60 tablet 3  . HUMALOG KWIKPEN 200 UNIT/ML SOPN INJECT 30 TO 36 UNITS SUBCUTANEOUSLY THREE TIMES DAILY BEFORE MEAL(S) 30 mL 3  . Insulin Detemir (LEVEMIR FLEXTOUCH) 100 UNIT/ML Pen Inject 60 Units into the skin 2 (two) times daily. 10 pen 2  . liraglutide (VICTOZA) 18 MG/3ML SOPN INJECT 1.8 SUBCUTANEOUSLY ONCE DAILY 27 mL 0  . oxymetazoline (AFRIN) 0.05 % nasal spray Place 1-2 sprays into both  nostrils 2 (two) times daily as needed for congestion.     . sertraline (ZOLOFT) 100 MG tablet Take 200 mg by mouth daily.     [name] spironolactone (ALDACTONE) 25 MG tablet Take 1 tablet by mouth once daily 30 tablet 0  . tobramycin (TOBREX) 0.3 % ophthalmic solution Place 1 drop into the right eye See admin instructions. Instill 1 drop into the right eye 3 times daily, the day before, the day of and the day after eye injection done every 6 weeks.     No current facility-administered medications on file prior to visit.    Cardiovascular studies:  EKG 06/15/2019: Sinus rhythm 86 bpm. Early R wave transition. Leftward axis.  No significant change compared to previous EKG.  Echocardiogram 05/31/2019:  1. The left ventricle has normal systolic function with an ejection fraction of 60-65%. The cavity size was normal. There is mild concentric left ventricular hypertrophy. Left ventricular diastolic Doppler parameters are consistent with impaired  relaxation. Elevated left ventricular end-diastolic pressure No evidence of left ventricular regional wall motion abnormalities.  2. The right ventricle has normal systolic function. The cavity was normal. There is no increase in right ventricular wall thickness.  3. The mitral valve is grossly normal.  4. The tricuspid valve is grossly normal.  5. The aortic valve is tricuspid.  6. The aorta is  normal unless otherwise noted.  7. The inferior vena cava was dilated in size with >50% respiratory variability.  8. The interatrial septum appears to be lipomatous.   Recent labs: 07/03/2019: Glucose 94, BUN/Cr 18/1.49. EGFR 48. Na/K 142/4.1.  H/H 12/36. MCV 87. Platelets 219 HbA1C 9.3%  07/12/2017: Chol 165, TG 100, HDL 46, LDL 99  Review of Systems  Eyes: Negative for visual disturbance.  Cardiovascular: Positive for chest pain, dyspnea on exertion and leg swelling. Negative for palpitations and syncope.  Respiratory: Positive for shortness of breath.  Negative for cough.   Endocrine: Negative for cold intolerance.  Skin: Negative for itching and rash.  Musculoskeletal: Negative for myalgias.  Psychiatric/Behavioral: The patient is not nervous/anxious.   All other systems reviewed and are negative.       Vitals:   10/12/19 1434  BP: (!) 144/78  Pulse: 83  Temp: 98.7 F (37.1 C)  SpO2: 95%     Body mass index is 46.26 kg/m. Filed Weights   10/12/19 1434  Weight: (!) 380 lb (172.4 kg)     Objective:   Physical Exam  Constitutional: He appears well-developed and well-nourished. No distress.  Morbidly obese  Neck: No JVD present.  Cardiovascular: Normal rate, regular rhythm and intact distal pulses.  No murmur heard. Pulmonary/Chest: Effort normal and breath sounds normal. He has no wheezes. He has no rales.  Abdominal: Soft. Bowel sounds are normal. He exhibits distension (Protuberant abdomen). There is no rebound.  Musculoskeletal:        General: Edema (Trace) present.  Neurological: No cranial nerve deficit.  Nursing note and vitals reviewed.         Assessment & Recommendations:   69 year old Caucasian male with hypertension, hyperlipidemia, type 2 diabetes mellitus, former smoker, COPD, OSA, obesity, referred for evaluation of shortness of breath.   Shortness of breath: Likely multifactorial-including obesity, deconditioning, COPD, OSA. In addition, angina equivalent due to coronary artery disease cannot be excluded.  I discussed the options for investigation as well as medical and invasive management with the patient.  Given his BMI of 47, he has high probability of false positive abnormal stress test, even if performed over 2 days.  Nonetheless, patient is reluctant to pursue any invasive management at this time. Thus, do not recommend stress test at this time.   He would like to continue current medical management at this time with diltiazem 120 mg daily and spironolactone 25 mg daily for antianginal,  antihypertensive, as well as diuretic properties.   Emphasized continued efforts at weight loss. He is not interested in pursuing surgical weight loss.  Hypertension: Better controlled.   Type 2 diabetes mellitus: Uncontrolled, with CKD 3.  Managed by endocrinologist Dr. Dorris Fetch.  COPD, OSA: Continue management as per pulmonology.  F/u in 6 months.   Nigel Mormon, MD [facility] Pager: [phone] Office: [phone] If no answer Cell [phone]

## 2019-10-18 ENCOUNTER — Other Ambulatory Visit: Payer: Self-pay | Admitting: Cardiology

## 2019-10-18 DIAGNOSIS — R0609 Other forms of dyspnea: Secondary | ICD-10-CM

## 2019-10-18 DIAGNOSIS — R079 Chest pain, unspecified: Secondary | ICD-10-CM

## 2019-10-25 DIAGNOSIS — E113211 Type 2 diabetes mellitus with mild nonproliferative diabetic retinopathy with macular edema, right eye: Secondary | ICD-10-CM | POA: Diagnosis not present

## 2019-10-25 DIAGNOSIS — H43821 Vitreomacular adhesion, right eye: Secondary | ICD-10-CM | POA: Diagnosis not present

## 2019-10-25 DIAGNOSIS — Z970 Presence of artificial eye: Secondary | ICD-10-CM | POA: Diagnosis not present

## 2019-10-25 DIAGNOSIS — H31091 Other chorioretinal scars, right eye: Secondary | ICD-10-CM | POA: Diagnosis not present

## 2019-10-26 DIAGNOSIS — E119 Type 2 diabetes mellitus without complications: Secondary | ICD-10-CM | POA: Diagnosis not present

## 2019-10-26 DIAGNOSIS — J9600 Acute respiratory failure, unspecified whether with hypoxia or hypercapnia: Secondary | ICD-10-CM | POA: Diagnosis not present

## 2019-10-26 DIAGNOSIS — Z299 Encounter for prophylactic measures, unspecified: Secondary | ICD-10-CM | POA: Diagnosis not present

## 2019-10-26 DIAGNOSIS — J449 Chronic obstructive pulmonary disease, unspecified: Secondary | ICD-10-CM | POA: Diagnosis not present

## 2019-10-26 DIAGNOSIS — Z87891 Personal history of nicotine dependence: Secondary | ICD-10-CM | POA: Diagnosis not present

## 2019-10-26 DIAGNOSIS — M62838 Other muscle spasm: Secondary | ICD-10-CM | POA: Diagnosis not present

## 2019-11-13 ENCOUNTER — Other Ambulatory Visit: Payer: Self-pay | Admitting: Cardiology

## 2019-11-13 DIAGNOSIS — R0609 Other forms of dyspnea: Secondary | ICD-10-CM

## 2019-11-24 ENCOUNTER — Other Ambulatory Visit: Payer: Self-pay | Admitting: "Endocrinology

## 2019-11-27 ENCOUNTER — Other Ambulatory Visit: Payer: Self-pay

## 2019-11-27 MED ORDER — HUMALOG KWIKPEN 200 UNIT/ML ~~LOC~~ SOPN: 30.0000 [IU] | PEN_INJECTOR | Freq: Three times a day (TID) | SUBCUTANEOUS | 3 refills | Status: DC

## 2019-11-30 DIAGNOSIS — Z961 Presence of intraocular lens: Secondary | ICD-10-CM | POA: Diagnosis not present

## 2019-11-30 DIAGNOSIS — H26491 Other secondary cataract, right eye: Secondary | ICD-10-CM | POA: Diagnosis not present

## 2019-11-30 DIAGNOSIS — E113311 Type 2 diabetes mellitus with moderate nonproliferative diabetic retinopathy with macular edema, right eye: Secondary | ICD-10-CM | POA: Diagnosis not present

## 2019-12-06 ENCOUNTER — Encounter: Payer: Self-pay | Admitting: "Endocrinology

## 2019-12-06 ENCOUNTER — Other Ambulatory Visit: Payer: Self-pay

## 2019-12-06 ENCOUNTER — Ambulatory Visit (INDEPENDENT_AMBULATORY_CARE_PROVIDER_SITE_OTHER): Payer: Medicare Other | Admitting: "Endocrinology

## 2019-12-06 VITALS — BP 143/75 | HR 78 | Ht 76.0 in | Wt 385.6 lb

## 2019-12-06 DIAGNOSIS — E782 Mixed hyperlipidemia: Secondary | ICD-10-CM

## 2019-12-06 DIAGNOSIS — E1165 Type 2 diabetes mellitus with hyperglycemia: Secondary | ICD-10-CM

## 2019-12-06 DIAGNOSIS — E118 Type 2 diabetes mellitus with unspecified complications: Secondary | ICD-10-CM | POA: Diagnosis not present

## 2019-12-06 DIAGNOSIS — Z794 Long term (current) use of insulin: Secondary | ICD-10-CM | POA: Diagnosis not present

## 2019-12-06 DIAGNOSIS — IMO0002 Reserved for concepts with insufficient information to code with codable children: Secondary | ICD-10-CM

## 2019-12-06 DIAGNOSIS — I10 Essential (primary) hypertension: Secondary | ICD-10-CM | POA: Diagnosis not present

## 2019-12-06 LAB — POCT GLYCOSYLATED HEMOGLOBIN (HGB A1C): Hemoglobin A1C: 8.5 % — AB (ref 4.0–5.6)

## 2019-12-06 MED ORDER — HUMULIN R U-500 KWIKPEN 500 UNIT/ML ~~LOC~~ SOPN: 60.0000 [IU] | PEN_INJECTOR | Freq: Three times a day (TID) | SUBCUTANEOUS | 2 refills | Status: DC

## 2019-12-06 NOTE — Progress Notes (Signed)
12/06/2019  Endocrinology follow-up note   Subjective:    Patient ID: Kenneth Patrick, male    DOB: 1951/09/28, PCP [name], MD   Past Medical History:  Diagnosis Date  . Cancer ([place])    basal cell   . COPD (chronic obstructive pulmonary disease) (HCC)    uses inhalers  . Depression   . Diabetic neuropathy ([place])   . DM2 (diabetes mellitus, type 2) (HCC)    insulin dependent  . Dyspnea    with exertion  . Hypercholesterolemia   . Low back pain    no meds  . Respiratory infection   . Sleep apnea    uses CPAP   Past Surgical History:  Procedure Laterality Date  . APPLICATION OF A-CELL OF HEAD/NECK Left 11/01/2018   Procedure: APPLICATION OF A-CELL TO SCALP WOUND;  Surgeon: Wallace Going, DO;  Location: [facility];  Service: Plastics;  Laterality: Left;  . CATARACT EXTRACTION W/PHACO Right 04/21/2019   Procedure: CATARACT EXTRACTION PHACO AND INTRAOCULAR LENS PLACEMENT RIGHT EYE;  Surgeon: Baruch Goldmann, MD;  Location: [facility];  Service: Ophthalmology;  Laterality: Right;  right  . CHOLECYSTECTOMY    . DEBRIDEMENT AND CLOSURE WOUND Left 11/01/2018   Procedure: Debridement scalp wound;  Surgeon: Wallace Going, DO;  Location: [facility];  Service: Plastics;  Laterality: Left;  . left eye enucleation following trauma     Social History   Socioeconomic History  . Marital status: Married    Spouse name: Not on file  . Number of children: 2  . Years of education: Not on file  . Highest education level: Not on file  Occupational History  . Occupation: retired Engineer, structural  Tobacco Use  . Smoking status: Former Smoker    Packs/day: 1.50    Years: 20.00    Pack years: 30.00    Start date: 1965    Quit date: 10/05/2004    Years since quitting: 15.1  . Smokeless tobacco: Never Used  Substance and Sexual Activity  . Alcohol use: No    Alcohol/week: 0.0 standard drinks  . Drug use: No    Comment: stopped smoking marijuana in  2006  . Sexual activity: Not on file  Other Topics Concern  . Not on file  Social History Narrative  . Not on file   Social Determinants of Health   Financial Resource Strain:   . Difficulty of Paying Living Expenses: Not on file  Food Insecurity:   . Worried About Charity fundraiser in the Last Year: Not on file  . Ran Out of Food in the Last Year: Not on file  Transportation Needs:   . Lack of Transportation (Medical): Not on file  . Lack of Transportation (Non-Medical): Not on file  Physical Activity:   . Days of Exercise per Week: Not on file  . Minutes of Exercise per Session: Not on file  Stress:   . Feeling of Stress : Not on file  Social Connections:   . Frequency of Communication with Friends and Family: Not on file  . Frequency of Social Gatherings with Friends and Family: Not on file  . Attends Religious Services: Not on file  . Active Member of Clubs or Organizations: Not on file  . Attends Archivist Meetings: Not on file  . Marital Status: Not on file   Outpatient Encounter Medications as of 12/06/2019  Medication Sig  . albuterol (VENTOLIN HFA) 108 (90 BASE)  MCG/ACT inhaler Inhale 2 puffs into the lungs every 6 (six) hours as needed for wheezing or shortness of breath.   [name] atorvastatin (LIPITOR) 80 MG tablet Take 80 mg by mouth at bedtime.   [name] CARTIA XT 120 MG 24 hr capsule Take 1 capsule by mouth once daily  . Continuous Blood Gluc Sensor (FREESTYLE LIBRE SENSOR SYSTEM) MISC Use one sensor every 10 days.  . Fluticasone-Umeclidin-Vilant (TRELEGY ELLIPTA) 100-62.5-25 MCG/INH AEPB Inhale 1 puff into the lungs daily.  . furosemide (LASIX) 40 MG tablet Take 40 mg by mouth daily as needed for fluid.   [name] gabapentin (NEURONTIN) 300 MG capsule Take 300 mg by mouth 2 (two) times daily.   [name] glipiZIDE (GLUCOTROL) 5 MG tablet Take 1 tablet (5 mg total) by mouth 2 (two) times daily before a meal.  . insulin regular human CONCENTRATED (HUMULIN R U-500 KWIKPEN) 500  UNIT/ML kwikpen Inject 60 Units into the skin 3 (three) times daily with meals. Do not start this insulin until next visit. Bring it with you to clinic.  [name] liraglutide (VICTOZA) 18 MG/3ML SOPN INJECT 1.8 SUBCUTANEOUSLY ONCE DAILY  . oxymetazoline (AFRIN) 0.05 % nasal spray Place 1-2 sprays into both nostrils 2 (two) times daily as needed for congestion.   . sertraline (ZOLOFT) 100 MG tablet Take 200 mg by mouth daily.   [name] spironolactone (ALDACTONE) 25 MG tablet Take 1 tablet by mouth once daily  . tobramycin (TOBREX) 0.3 % ophthalmic solution Place 1 drop into the right eye See admin instructions. Instill 1 drop into the right eye 3 times daily, the day before, the day of and the day after eye injection done every 6 weeks.  . [DISCONTINUED] Insulin Detemir (LEVEMIR FLEXTOUCH) 100 UNIT/ML Pen Inject 60 Units into the skin 2 (two) times daily.  . [DISCONTINUED] Insulin Lispro (HUMALOG KWIKPEN) 200 UNIT/ML SOPN Inject 30 Units into the skin 3 (three) times daily. Inject 30 units subcutaneously three times daily before meals   No facility-administered encounter medications on file as of 12/06/2019.   ALLERGIES: Allergies  Allergen Reactions  . Penicillins Swelling and Rash    Did it involve swelling of the face/tongue/throat, SOB, or low BP? Yes Did it involve sudden or severe rash/hives, skin peeling, or any reaction on the inside of your mouth or nose? No Did you need to seek medical attention at a hospital or doctor's office? Yes When did it last happen?Childhood allergy If all above answers are "NO", may proceed with cephalosporin use.     VACCINATION STATUS: Immunization History  Administered Date(s) Administered  . Fluad Quad(high Dose 65+) 06/09/2019  . Influenza Split 10/23/2011  . Influenza Whole 09/04/2010  . Influenza,inj,Quad PF,6+ Mos 06/08/2013, 09/03/2014, 07/31/2015, 06/22/2016, 06/29/2018  . Pneumococcal Conjugate-13 03/05/2015  . Pneumococcal Polysaccharide-23  05/14/2011, 11/27/2013, 06/09/2019    Diabetes He presents for his follow-up diabetic visit. He has type 2 diabetes mellitus. Onset time: He was diagnosed at approximate age of 67 years. His disease course has been improving. There are no hypoglycemic associated symptoms. Pertinent negatives for hypoglycemia include no confusion, headaches, pallor or seizures. Pertinent negatives for diabetes include no chest pain, no fatigue, no polydipsia, no polyphagia, no polyuria and no weakness. There are no hypoglycemic complications. Symptoms are improving. There are no diabetic complications. Risk factors for coronary artery disease include diabetes mellitus, dyslipidemia, hypertension, male sex, obesity, sedentary lifestyle and tobacco exposure. Current diabetic treatment includes insulin injections. He is compliant with treatment most of the time. His weight is  increasing steadily. He is following a generally unhealthy diet. When asked about meal planning, he reported none. He has had a previous visit with a dietitian. He never participates in exercise. His home blood glucose trend is decreasing steadily. His breakfast blood glucose range is generally 140-180 mg/dl. His lunch blood glucose range is generally 180-200 mg/dl. His dinner blood glucose range is generally 180-200 mg/dl. His bedtime blood glucose range is generally 180-200 mg/dl. His overall blood glucose range is 180-200 mg/dl. (He returns with his CGM device.  Printout shows average blood glucose of 185 over the last 2 weeks, 53% time range, 47% above range.  No hypoglycemia.  Point-of-care A1c is 8.5%, improving from 9.3%.) Eye exam is current.  Hyperlipidemia This is a chronic problem. The current episode started more than 1 year ago. The problem is uncontrolled. Exacerbating diseases include diabetes and obesity. Pertinent negatives include no chest pain, myalgias or shortness of breath. Current antihyperlipidemic treatment includes statins.  Compliance problems include adherence to diet and adherence to exercise.  Risk factors for coronary artery disease include dyslipidemia, diabetes mellitus, obesity, male sex and a sedentary lifestyle.  Hypertension This is a chronic problem. The current episode started more than 1 year ago. The problem is controlled. Pertinent negatives include no chest pain, headaches, neck pain, palpitations or shortness of breath. Risk factors for coronary artery disease include diabetes mellitus, dyslipidemia, male gender, obesity, smoking/tobacco exposure and sedentary lifestyle. Compliance problems include diet and exercise.     Review of systems  Constitutional: + Weight gain ,  current  Body mass index is 46.94 kg/m. , no fatigue, no subjective hyperthermia, no subjective hypothermia Eyes: no blurry vision, no xerophthalmia ENT: no sore throat, no nodules palpated in throat, no dysphagia/odynophagia, no hoarseness Cardiovascular: no Chest Pain, no Shortness of Breath, no palpitations, no leg swelling Respiratory: no cough, no shortness of breath Gastrointestinal: no Nausea/Vomiting/Diarhhea Musculoskeletal: no muscle/joint aches Skin: no rashes, no hyperemia Neurological: no tremors, no numbness, no tingling, no dizziness Psychiatric: no depression, no anxiety    Objective:    BP (!) 143/75   Pulse 78   Ht 6\' 4"  (1.93 m)   Wt (!) 385 lb 9.6 oz (174.9 kg)   BMI 46.94 kg/m   Wt Readings from Last 3 Encounters:  12/06/19 (!) 385 lb 9.6 oz (174.9 kg)  10/12/19 (!) 380 lb (172.4 kg)  07/10/19 (!) 381 lb (172.8 kg)      Physical Exam- Limited  Constitutional:  Body mass index is 46.94 kg/m. , not in acute distress, normal state of mind Eyes:  EOMI, no exophthalmos Neck: Supple Thyroid: No gross goiter Respiratory: Adequate breathing efforts Musculoskeletal: no gross deformities, strength intact in all four extremities, no gross restriction of joint movements Skin:  no rashes, no  hyperemia Neurological: no tremor with outstretched hands,   Diabetic Labs (most recent): Lab Results  Component Value Date   HGBA1C 8.5 (A) 12/06/2019   HGBA1C 9.3 (H) 07/26/2019   HGBA1C 8.4 (H) 04/18/2019   Recent Results (from the past 2160 hour(s))  HgB A1c     Status: Abnormal   Collection Time: 12/06/19  3:33 PM  Result Value Ref Range   Hemoglobin A1C 8.5 (A) 4.0 - 5.6 %   HbA1c POC (<> result, manual entry)     HbA1c, POC (prediabetic range)     HbA1c, POC (controlled diabetic range)     Lipid Panel     Component Value Date/Time   CHOL 165 07/12/2017 1324  TRIG 100 07/12/2017 1324   HDL 46 07/12/2017 1324   CHOLHDL 4.0 06/26/2016 1403   VLDL 22 06/26/2016 1403   LDLCALC 99 07/12/2017 1324     Assessment & Plan:   1. Uncontrolled type 2 diabetes mellitus with complication, with long-term current use of insulin ([place])  -His diabetes is  complicated by morbid obesity and sleep apnea and patient remains at a high risk for more acute and chronic complications of diabetes which include CAD, CVA, CKD, retinopathy, and neuropathy. These are all discussed in detail with the patient.  [place] reports persistent, severe hyperglycemia both fasting and post prandial. He returns with his CGM device.  Printout shows average blood glucose of 185 over the last 2 weeks, 53% time range, 47% above range.  No hypoglycemia.  Point-of-care A1c is 8.5%, improving from 9.3%.   -  Glucose logs and insulin administration records pertaining to this visit,  to be scanned into patient's records.  Recent labs reviewed.   - I have re-counseled the patient on diet management and weight loss  by adopting a carbohydrate restricted / protein rich  Diet.   - he  admits there is a room for improvement in his diet and drink choices. -  Suggestion is made for him to avoid simple carbohydrates  from his diet including Cakes, Sweet Desserts / Pastries, Ice Cream, Soda (diet and regular), Sweet Tea,  Candies, Chips, Cookies, Sweet Pastries,  Store Bought Juices, Alcohol in Excess of  1-2 drinks a day, Artificial Sweeteners, Coffee Creamer, and "Sugar-free" Products. This will help patient to have stable blood glucose profile and potentially avoid unintended weight gain.   - Patient is advised to stick to a routine mealtimes to eat 3 meals  a day and avoid unnecessary snacks ( to snack only to correct hypoglycemia).   - I have approached patient with the following individualized plan to manage diabetes and patient agrees.   -  He will continue to require intensive treatment with higher dose of basal/bolus insulin in order for him to achieve and maintain control of diabetes to target. -He will be considered for insulin U500, after he uses up his current supplies of Levemir and Humalog. -His insulin is prescribed to his pharmacy for him to pick up and bring during his next visit in 2 weeks. -In the meantime, he is advised to continue Levemir 60 units twice daily,  continue Humalog 30 units 3 times daily before meals  for pre-meal BG readings of 90-150mg /dl, plus patient specific correction dose of rapid acting insulin for unexpected hyperglycemia above 150mg /dl, associated with strict documentation of blood glucose 4 times a day-before meals and at bedtime.  -He is advised to shift some of his carbs from supper to lunch for proper distribution of his caloric exposure across the day.  -Adjustment parameters for hypo and hyperglycemia were given in a written document to patient. -Patient is encouraged to call clinic for blood glucose levels less than 70 or above 300 mg /dl. -He he is advised to continue Victoza 1.8 mg subcutaneously daily.    -Due to moderate renal insufficiency, I advised him to stay off of metformin for now.  - He has shown some benefit from glipizide treatment.  He is advised to continue glipizide 5 mg p.o. twice daily with breakfast and with supper.    - Patient specific  target  for A1c; LDL, HDL, Triglycerides, and  Waist Circumference were discussed in detail.  2) BP/HTN :  His blood  pressure is not controlled to target.    He is advised to continue his current blood pressure medications, will be considered for low-dose lisinopril next visit.   3) Lipids/HPL: His recent lipid panel showed uncontrolled LDL at 99.  He is advised to continue atorvastatin 80 mg p.o. nightly.  Side effects and precautions discussed with him.    4)  Weight/Diet: His BMI is 67-EHMCNOB complicating the care of his diabetes.  He is a candidate for modest weight loss.    CDE consult in progress, exercise, and carbohydrates information provided.  5) Chronic Care/Health Maintenance:  -Patient is on ACEI/ARB and Statin medications and encouraged to continue to follow up with Ophthalmology, Podiatrist at least yearly or according to recommendations, and advised to  stay away from smoking. I have recommended yearly flu vaccine and pneumonia vaccination at least every 5 years; moderate intensity exercise for up to 150 minutes weekly; and  sleep for at least 7 hours a day.  - I advised patient to maintain close follow up with [name], MD for primary care needs.  - Time spent on this patient care encounter:  40 min, of which > 50% was spent in  counseling and the rest reviewing his blood glucose logs , discussing his hypoglycemia and hyperglycemia episodes, reviewing his current and  previous labs / studies  ( including abstraction from other facilities) and medications  doses and developing a  long term treatment plan and documenting his care.   Please refer to Patient Instructions for Blood Glucose Monitoring and Insulin/Medications Dosing Guide"  in media tab for additional information. Please  also refer to " Patient Self Inventory" in the Media  tab for reviewed elements of pertinent patient history.  Kenneth Patrick participated in the discussions, expressed understanding, and voiced  agreement with the above plans.  All questions were answered to his satisfaction. he is encouraged to contact clinic should he have any questions or concerns prior to his return visit.   Follow up plan: -Return in about 2 weeks (around 12/20/2019) for Follow up with Meter and Logs Only - no Labs.  Glade Lloyd, MD Phone: [phone]  Fax: [phone]   -  This note was partially dictated with voice recognition software. Similar sounding words can be transcribed inadequately or may not  be corrected upon review.  12/06/2019, 5:02 PM

## 2019-12-06 NOTE — Patient Instructions (Signed)

## 2019-12-09 ENCOUNTER — Other Ambulatory Visit: Payer: Self-pay | Admitting: "Endocrinology

## 2019-12-20 ENCOUNTER — Encounter: Payer: Self-pay | Admitting: "Endocrinology

## 2019-12-20 ENCOUNTER — Other Ambulatory Visit: Payer: Self-pay

## 2019-12-20 ENCOUNTER — Ambulatory Visit (INDEPENDENT_AMBULATORY_CARE_PROVIDER_SITE_OTHER): Payer: Medicare Other | Admitting: "Endocrinology

## 2019-12-20 VITALS — BP 147/76 | HR 83 | Ht 76.0 in | Wt 389.0 lb

## 2019-12-20 DIAGNOSIS — E1165 Type 2 diabetes mellitus with hyperglycemia: Secondary | ICD-10-CM

## 2019-12-20 DIAGNOSIS — I10 Essential (primary) hypertension: Secondary | ICD-10-CM | POA: Diagnosis not present

## 2019-12-20 DIAGNOSIS — Z794 Long term (current) use of insulin: Secondary | ICD-10-CM | POA: Diagnosis not present

## 2019-12-20 DIAGNOSIS — E118 Type 2 diabetes mellitus with unspecified complications: Secondary | ICD-10-CM

## 2019-12-20 DIAGNOSIS — E782 Mixed hyperlipidemia: Secondary | ICD-10-CM | POA: Diagnosis not present

## 2019-12-20 DIAGNOSIS — IMO0002 Reserved for concepts with insufficient information to code with codable children: Secondary | ICD-10-CM

## 2019-12-20 NOTE — Patient Instructions (Signed)

## 2019-12-20 NOTE — Progress Notes (Signed)
12/20/2019  Endocrinology follow-up note   Subjective:    Patient ID: Kenneth Patrick, male    DOB: 18-Jul-1951, PCP [name], MD   Past Medical History:  Diagnosis Date  . Cancer ([place])    basal cell   . COPD (chronic obstructive pulmonary disease) ([facility])    uses inhalers  . Depression   . Diabetic neuropathy ([place])   . DM2 (diabetes mellitus, type 2) ([facility])    insulin dependent  . Dyspnea    with exertion  . Hypercholesterolemia   . Low back pain    no meds  . Respiratory infection   . Sleep apnea    uses CPAP   Past Surgical History:  Procedure Laterality Date  . APPLICATION OF A-CELL OF HEAD/NECK Left 11/01/2018   Procedure: APPLICATION OF A-CELL TO SCALP WOUND;  Surgeon: Wallace Going, DO;  Location: [facility];  Service: Plastics;  Laterality: Left;  . CATARACT EXTRACTION W/PHACO Right 04/21/2019   Procedure: CATARACT EXTRACTION PHACO AND INTRAOCULAR LENS PLACEMENT RIGHT EYE;  Surgeon: Baruch Goldmann, MD;  Location: [facility];  Service: Ophthalmology;  Laterality: Right;  right  . CHOLECYSTECTOMY    . DEBRIDEMENT AND CLOSURE WOUND Left 11/01/2018   Procedure: Debridement scalp wound;  Surgeon: Wallace Going, DO;  Location: [facility];  Service: Plastics;  Laterality: Left;  . left eye enucleation following trauma     Social History   Socioeconomic History  . Marital status: Married    Spouse name: Not on file  . Number of children: 2  . Years of education: Not on file  . Highest education level: Not on file  Occupational History  . Occupation: retired Engineer, structural  Tobacco Use  . Smoking status: Former Smoker    Packs/day: 1.50    Years: 20.00    Pack years: 30.00    Start date: 1965    Quit date: 10/05/2004    Years since quitting: 15.2  . Smokeless tobacco: Never Used  Substance and Sexual Activity  . Alcohol use: No    Alcohol/week: 0.0 standard drinks  . Drug use: No    Comment: stopped smoking marijuana  in 2006  . Sexual activity: Not on file  Other Topics Concern  . Not on file  Social History Narrative  . Not on file   Social Determinants of Health   Financial Resource Strain:   . Difficulty of Paying Living Expenses:   Food Insecurity:   . Worried About Charity fundraiser in the Last Year:   . Arboriculturist in the Last Year:   Transportation Needs:   . Film/video editor (Medical):   [name] Lack of Transportation (Non-Medical):   Physical Activity:   . Days of Exercise per Week:   . Minutes of Exercise per Session:   Stress:   . Feeling of Stress :   Social Connections:   . Frequency of Communication with Friends and Family:   . Frequency of Social Gatherings with Friends and Family:   . Attends Religious Services:   . Active Member of Clubs or Organizations:   . Attends Archivist Meetings:   [name] Marital Status:    Outpatient Encounter Medications as of 12/20/2019  Medication Sig  . Semaglutide,0.25 or 0.5MG /DOS, 2 MG/1.5ML SOPN Inject 0.5 mg into the skin once a week.  [name] albuterol (VENTOLIN HFA) 108 (90 BASE) MCG/ACT inhaler Inhale 2 puffs into the lungs every 6 (six)  hours as needed for wheezing or shortness of breath.   [name] atorvastatin (LIPITOR) 80 MG tablet Take 80 mg by mouth at bedtime.   [name] CARTIA XT 120 MG 24 hr capsule Take 1 capsule by mouth once daily  . Continuous Blood Gluc Sensor (FREESTYLE LIBRE SENSOR SYSTEM) MISC Use one sensor every 10 days.  . Fluticasone-Umeclidin-Vilant (TRELEGY ELLIPTA) 100-62.5-25 MCG/INH AEPB Inhale 1 puff into the lungs daily.  . furosemide (LASIX) 40 MG tablet Take 40 mg by mouth daily as needed for fluid.   [name] gabapentin (NEURONTIN) 300 MG capsule Take 300 mg by mouth 2 (two) times daily.   [name] glipiZIDE (GLUCOTROL) 5 MG tablet Take 1 tablet (5 mg total) by mouth 2 (two) times daily before a meal.  . insulin regular human CONCENTRATED (HUMULIN R U-500 KWIKPEN) 500 UNIT/ML kwikpen Inject 60 Units into the skin 3 (three)  times daily with meals. Do not start this insulin until next visit. Bring it with you to clinic.  [name] oxymetazoline (AFRIN) 0.05 % nasal spray Place 1-2 sprays into both nostrils 2 (two) times daily as needed for congestion.   . sertraline (ZOLOFT) 100 MG tablet Take 200 mg by mouth daily.   [name] spironolactone (ALDACTONE) 25 MG tablet Take 1 tablet by mouth once daily  . tobramycin (TOBREX) 0.3 % ophthalmic solution Place 1 drop into the right eye See admin instructions. Instill 1 drop into the right eye 3 times daily, the day before, the day of and the day after eye injection done every 6 weeks.  . [DISCONTINUED] liraglutide (VICTOZA) 18 MG/3ML SOPN INJECT 1.8 MG SUBCUTANEOUSLY ONCE DAILY   No facility-administered encounter medications on file as of 12/20/2019.   ALLERGIES: Allergies  Allergen Reactions  . Penicillins Swelling and Rash    Did it involve swelling of the face/tongue/throat, SOB, or low BP? Yes Did it involve sudden or severe rash/hives, skin peeling, or any reaction on the inside of your mouth or nose? No Did you need to seek medical attention at a hospital or doctor's office? Yes When did it last happen?Childhood allergy If all above answers are "NO", may proceed with cephalosporin use.     VACCINATION STATUS: Immunization History  Administered Date(s) Administered  . Fluad Quad(high Dose 65+) 06/09/2019  . Influenza Split 10/23/2011  . Influenza Whole 09/04/2010  . Influenza,inj,Quad PF,6+ Mos 06/08/2013, 09/03/2014, 07/31/2015, 06/22/2016, 06/29/2018  . Pneumococcal Conjugate-13 03/05/2015  . Pneumococcal Polysaccharide-23 05/14/2011, 11/27/2013, 06/09/2019    Diabetes He presents for his follow-up diabetic visit. He has type 2 diabetes mellitus. Onset time: He was diagnosed at approximate age of 62 years. His disease course has been worsening. There are no hypoglycemic associated symptoms. Pertinent negatives for hypoglycemia include no confusion, headaches,  pallor or seizures. Pertinent negatives for diabetes include no chest pain, no fatigue, no polydipsia, no polyphagia, no polyuria and no weakness. There are no hypoglycemic complications. Symptoms are worsening. There are no diabetic complications. Risk factors for coronary artery disease include diabetes mellitus, dyslipidemia, hypertension, male sex, obesity, sedentary lifestyle and tobacco exposure. Current diabetic treatment includes insulin injections. He is compliant with treatment most of the time. His weight is increasing steadily. He is following a generally unhealthy diet. When asked about meal planning, he reported none. He has had a previous visit with a dietitian. He never participates in exercise. His home blood glucose trend is fluctuating minimally. His breakfast blood glucose range is generally 140-180 mg/dl. His lunch blood glucose range is generally 180-200 mg/dl. His  dinner blood glucose range is generally 180-200 mg/dl. His bedtime blood glucose range is generally 180-200 mg/dl. His overall blood glucose range is 180-200 mg/dl. (He returns with his CGM device.  Printout shows average blood glucose of 178 over the last 2 weeks, 56% time in range, 43% above range, 1% hypoglycemia.    He is recent point-of-care A1c is 8.5%, improving from 9.3%.) Eye exam is current.  Hyperlipidemia This is a chronic problem. The current episode started more than 1 year ago. The problem is uncontrolled. Exacerbating diseases include diabetes and obesity. Pertinent negatives include no chest pain, myalgias or shortness of breath. Current antihyperlipidemic treatment includes statins. Compliance problems include adherence to diet and adherence to exercise.  Risk factors for coronary artery disease include dyslipidemia, diabetes mellitus, obesity, male sex and a sedentary lifestyle.  Hypertension This is a chronic problem. The current episode started more than 1 year ago. The problem is controlled. Pertinent  negatives include no chest pain, headaches, neck pain, palpitations or shortness of breath. Risk factors for coronary artery disease include diabetes mellitus, dyslipidemia, male gender, obesity, smoking/tobacco exposure and sedentary lifestyle. Compliance problems include diet and exercise.     Review of systems  Constitutional: + Weight gain ,  current  Body mass index is 47.35 kg/m. , no fatigue, no subjective hyperthermia, no subjective hypothermia Eyes: no blurry vision, no xerophthalmia ENT: no sore throat, no nodules palpated in throat, no dysphagia/odynophagia, no hoarseness Cardiovascular: no Chest Pain, no Shortness of Breath, no palpitations, no leg swelling Respiratory: no cough, no shortness of breath Gastrointestinal: no Nausea/Vomiting/Diarhhea Musculoskeletal: no muscle/joint aches Skin: no rashes, no hyperemia Neurological: no tremors, no numbness, no tingling, no dizziness Psychiatric: no depression, no anxiety    Objective:    BP (!) 147/76   Pulse 83   Ht 6\' 4"  (1.93 m)   Wt (!) 389 lb (176.4 kg)   BMI 47.35 kg/m   Wt Readings from Last 3 Encounters:  12/20/19 (!) 389 lb (176.4 kg)  12/06/19 (!) 385 lb 9.6 oz (174.9 kg)  10/12/19 (!) 380 lb (172.4 kg)       Physical Exam- Limited  Constitutional:  Body mass index is 47.35 kg/m. , not in acute distress, normal state of mind Eyes:  EOMI, no exophthalmos Neck: Supple Thyroid: No gross goiter Respiratory: Adequate breathing efforts Musculoskeletal: no gross deformities, strength intact in all four extremities, no gross restriction of joint movements Skin:  no rashes, no hyperemia Neurological: no tremor with outstretched hands,   Diabetic Labs (most recent): Lab Results  Component Value Date   HGBA1C 8.5 (A) 12/06/2019   HGBA1C 9.3 (H) 07/26/2019   HGBA1C 8.4 (H) 04/18/2019   Recent Results (from the past 2160 hour(s))  HgB A1c     Status: Abnormal   Collection Time: 12/06/19  3:33 PM  Result  Value Ref Range   Hemoglobin A1C 8.5 (A) 4.0 - 5.6 %   HbA1c POC (<> result, manual entry)     HbA1c, POC (prediabetic range)     HbA1c, POC (controlled diabetic range)     Lipid Panel     Component Value Date/Time   CHOL 165 07/12/2017 1324   TRIG 100 07/12/2017 1324   HDL 46 07/12/2017 1324   CHOLHDL 4.0 06/26/2016 1403   VLDL 22 06/26/2016 1403   LDLCALC 99 07/12/2017 1324     Assessment & Plan:   1. Uncontrolled type 2 diabetes mellitus with complication, with long-term current use of insulin ([place])  -  His diabetes is  complicated by morbid obesity and sleep apnea and patient remains at a high risk for more acute and chronic complications of diabetes which include CAD, CVA, CKD, retinopathy, and neuropathy. These are all discussed in detail with the patient.  Kenneth Patrick reports persistent, severe hyperglycemia both fasting and post prandial.  He returns with his CGM device.  Printout shows average blood glucose of 178 over the last 2 weeks, 56% time in range, 43% above range, 1% hypoglycemia.    He is recent point-of-care A1c is 8.5%, improving from 9.3%. -  Glucose logs and insulin administration records pertaining to this visit,  to be scanned into patient's records.  Recent labs reviewed.   - I have re-counseled the patient on diet management and weight loss  by adopting a carbohydrate restricted / protein rich  Diet.   - he  admits there is a room for improvement in his diet and drink choices. -  Suggestion is made for him to avoid simple carbohydrates  from his diet including Cakes, Sweet Desserts / Pastries, Ice Cream, Soda (diet and regular), Sweet Tea, Candies, Chips, Cookies, Sweet Pastries,  Store Bought Juices, Alcohol in Excess of  1-2 drinks a day, Artificial Sweeteners, Coffee Creamer, and "Sugar-free" Products. This will help patient to have stable blood glucose profile and potentially avoid unintended weight gain.   - Patient is advised to stick to a routine  mealtimes to eat 3 meals  a day and avoid unnecessary snacks ( to snack only to correct hypoglycemia).   - I have approached patient with the following individualized plan to manage diabetes and patient agrees.   -  He will continue to require intensive treatment with higher dose of basal/bolus insulin in order for him to achieve and maintain control of diabetes to target. -He will be switched to  insulin U500, now that he has finished his current supplies of Levemir and Humalog.  -He is advised to start insulin U500 60 units 3 times a day with meals, when premeal blood glucose readings are above 90 mg per DL. -Proper utilization of insulin U500 was demonstrated for him in the exam room. -He is advised to continue to use his CGM device at all times.  He is advised to avoid any other kinds of insulin while using insulin U500.  -He is advised to shift some of his carbs from supper to lunch for proper distribution of his caloric exposure across the day. -Patient is encouraged to call clinic for blood glucose levels less than 70 or above 300 mg /dl. -He has finished his current supplies of Victoza, has Ozempic pens at home.  He is advised to use Ozempic 0.5 mg subcutaneously weekly in place of Victoza.    -Due to moderate renal insufficiency, I advised him to stay off of metformin for now.  - He has shown some benefit from glipizide treatment.  He is advised to continue glipizide 5 mg p.o. twice daily with breakfast and with supper.    - Patient specific target  for A1c; LDL, HDL, Triglycerides, and  Waist Circumference were discussed in detail.  2) BP/HTN :  His blood pressure is not controlled to target.      He is advised to continue his current blood pressure medications, will be considered for low-dose lisinopril next visit.   3) Lipids/HPL: His recent lipid panel showed uncontrolled LDL at 99.  He is advised to continue atorvastatin 80 mg p.o. nightly.  Side effects and precautions  discussed with him.    4)  Weight/Diet: His BMI is 56-[id number] complicating the care of his diabetes.  He is a candidate for modest weight loss.    CDE consult in progress, exercise, and carbohydrates information provided.  5) Chronic Care/Health Maintenance:  -Patient is on ACEI/ARB and Statin medications and encouraged to continue to follow up with Ophthalmology, Podiatrist at least yearly or according to recommendations, and advised to  stay away from smoking. I have recommended yearly flu vaccine and pneumonia vaccination at least every 5 years; moderate intensity exercise for up to 150 minutes weekly; and  sleep for at least 7 hours a day.  - I advised patient to maintain close follow up with [name], MD for primary care needs.  - Time spent on this patient care encounter:  35 min, of which > 50% was spent in  counseling and the rest reviewing his blood glucose logs , discussing his hypoglycemia and hyperglycemia episodes, reviewing his current and  previous labs / studies  ( including abstraction from other facilities) and medications  doses and developing a  long term treatment plan and documenting his care.   Please refer to Patient Instructions for Blood Glucose Monitoring and Insulin/Medications Dosing Guide"  in media tab for additional information. Please  also refer to " Patient Self Inventory" in the Media  tab for reviewed elements of pertinent patient history.  Kenneth Patrick participated in the discussions, expressed understanding, and voiced agreement with the above plans.  All questions were answered to his satisfaction. he is encouraged to contact clinic should he have any questions or concerns prior to his return visit.   Follow up plan: -Return in about 2 weeks (around 01/03/2020), or phone, for Follow up with Meter and Logs Only - no Labs.  Glade Lloyd, MD Phone: [phone]  Fax: [phone]   -  This note was partially dictated with voice recognition software.  Similar sounding words can be transcribed inadequately or may not  be corrected upon review.  12/20/2019, 5:03 PM

## 2020-01-08 ENCOUNTER — Ambulatory Visit (INDEPENDENT_AMBULATORY_CARE_PROVIDER_SITE_OTHER): Payer: Medicare Other | Admitting: "Endocrinology

## 2020-01-08 ENCOUNTER — Encounter: Payer: Self-pay | Admitting: "Endocrinology

## 2020-01-08 DIAGNOSIS — E782 Mixed hyperlipidemia: Secondary | ICD-10-CM

## 2020-01-08 DIAGNOSIS — I10 Essential (primary) hypertension: Secondary | ICD-10-CM | POA: Diagnosis not present

## 2020-01-08 DIAGNOSIS — E118 Type 2 diabetes mellitus with unspecified complications: Secondary | ICD-10-CM

## 2020-01-08 DIAGNOSIS — IMO0002 Reserved for concepts with insufficient information to code with codable children: Secondary | ICD-10-CM

## 2020-01-08 DIAGNOSIS — Z794 Long term (current) use of insulin: Secondary | ICD-10-CM

## 2020-01-08 DIAGNOSIS — E1165 Type 2 diabetes mellitus with hyperglycemia: Secondary | ICD-10-CM

## 2020-01-08 MED ORDER — HUMULIN R U-500 KWIKPEN 500 UNIT/ML ~~LOC~~ SOPN: 60.0000 [IU] | PEN_INJECTOR | Freq: Three times a day (TID) | SUBCUTANEOUS | 2 refills | Status: DC

## 2020-01-08 NOTE — Patient Instructions (Signed)

## 2020-01-08 NOTE — Progress Notes (Addendum)
01/08/2020                                                     Endocrinology Telehealth Visit Follow up Note -During COVID -19 Pandemic  This visit type was conducted due to national recommendations for restrictions regarding the COVID-19 Pandemic  in an effort to limit this patient's exposure and mitigate transmission of the corona virus.  Due to his co-morbid illnesses, Kenneth Patrick is at  moderate to high risk for complications without adequate follow up.  This format is felt to be most appropriate for him at this time.  I connected with this patient on 01/16/2020   by telephone and verified that I am speaking with the correct person using two identifiers. Kenneth Patrick, 12/14/1950. he has verbally consented to this visit. All issues noted in this document were discussed and addressed. The format was not optimal for physical exam.     Subjective:    Patient ID: Kenneth Patrick, male    DOB: 01-08-1951, PCP [name], MD   Past Medical History:  Diagnosis Date  . Cancer ([place])    basal cell   . COPD (chronic obstructive pulmonary disease) (H[facility])    uses inhalers  . Depression   . Diabetic neuropathy ([place])   . DM2 (diabetes mellitus, type 2) ([facility])    insulin dependent  . Dyspnea    with exertion  . Hypercholesterolemia   . Low back pain    no meds  . Respiratory infection   . Sleep apnea    uses CPAP   Past Surgical History:  Procedure Laterality Date  . APPLICATION OF A-CELL OF HEAD/NECK Left 11/01/2018   Procedure: APPLICATION OF A-CELL TO SCALP WOUND;  Surgeon: Wallace Going, DO;  Location: [facility];  Service: Plastics;  Laterality: Left;  . CATARACT EXTRACTION W/PHACO Right 04/21/2019   Procedure: CATARACT EXTRACTION PHACO AND INTRAOCULAR LENS PLACEMENT RIGHT EYE;  Surgeon: Baruch Goldmann, MD;  Location: [facility];  Service: Ophthalmology;  Laterality: Right;  right  . CHOLECYSTECTOMY    . DEBRIDEMENT AND CLOSURE WOUND Left 11/01/2018   Procedure:  Debridement scalp wound;  Surgeon: Wallace Going, DO;  Location: [facility];  Service: Plastics;  Laterality: Left;  . left eye enucleation following trauma     Social History   Socioeconomic History  . Marital status: Married    Spouse name: Not on file  . Number of children: 2  . Years of education: Not on file  . Highest education level: Not on file  Occupational History  . Occupation: retired Engineer, structural  Tobacco Use  . Smoking status: Former Smoker    Packs/day: 1.50    Years: 20.00    Pack years: 30.00    Start date: 1965    Quit date: 10/05/2004    Years since quitting: 15.2  . Smokeless tobacco: Never Used  Substance and Sexual Activity  . Alcohol use: No    Alcohol/week: 0.0 standard drinks  . Drug use: No    Comment: stopped smoking marijuana in 2006  . Sexual activity: Not on file  Other Topics Concern  . Not on file  Social History Narrative  . Not on file   Social Determinants of Health   Financial Resource Strain:   .  Difficulty of Paying Living Expenses:   Food Insecurity:   . Worried About Charity fundraiser in the Last Year:   . Arboriculturist in the Last Year:   Transportation Needs:   . Film/video editor (Medical):   [name] Lack of Transportation (Non-Medical):   Physical Activity:   . Days of Exercise per Week:   . Minutes of Exercise per Session:   Stress:   . Feeling of Stress :   Social Connections:   . Frequency of Communication with Friends and Family:   . Frequency of Social Gatherings with Friends and Family:   . Attends Religious Services:   . Active Member of Clubs or Organizations:   . Attends Archivist Meetings:   [name] Marital Status:    Outpatient Encounter Medications as of 01/08/2020  Medication Sig  . albuterol (VENTOLIN HFA) 108 (90 BASE) MCG/ACT inhaler Inhale 2 puffs into the lungs every 6 (six) hours as needed for wheezing or shortness of breath.   [name] atorvastatin (LIPITOR) 80 MG tablet  Take 80 mg by mouth at bedtime.   [name] CARTIA XT 120 MG 24 hr capsule Take 1 capsule by mouth once daily  . Continuous Blood Gluc Sensor (FREESTYLE LIBRE SENSOR SYSTEM) MISC Use one sensor every 10 days.  . Fluticasone-Umeclidin-Vilant (TRELEGY ELLIPTA) 100-62.5-25 MCG/INH AEPB Inhale 1 puff into the lungs daily.  . furosemide (LASIX) 40 MG tablet Take 40 mg by mouth daily as needed for fluid.   [name] gabapentin (NEURONTIN) 300 MG capsule Take 300 mg by mouth 2 (two) times daily.   [name] glipiZIDE (GLUCOTROL) 5 MG tablet Take 1 tablet (5 mg total) by mouth 2 (two) times daily before a meal.  . insulin regular human CONCENTRATED (HUMULIN R U-500 KWIKPEN) 500 UNIT/ML kwikpen Inject 60-70 Units into the skin 3 (three) times daily with meals. Inject 70 units with breakfast and supper, and 60 units with supper if BG is above 90  . oxymetazoline (AFRIN) 0.05 % nasal spray Place 1-2 sprays into both nostrils 2 (two) times daily as needed for congestion.   . Semaglutide,0.25 or 0.5MG /DOS, 2 MG/1.5ML SOPN Inject 0.5 mg into the skin once a week.  . sertraline (ZOLOFT) 100 MG tablet Take 200 mg by mouth daily.   [name] spironolactone (ALDACTONE) 25 MG tablet Take 1 tablet by mouth once daily  . tobramycin (TOBREX) 0.3 % ophthalmic solution Place 1 drop into the right eye See admin instructions. Instill 1 drop into the right eye 3 times daily, the day before, the day of and the day after eye injection done every 6 weeks.  . [DISCONTINUED] insulin regular human CONCENTRATED (HUMULIN R U-500 KWIKPEN) 500 UNIT/ML kwikpen Inject 60 Units into the skin 3 (three) times daily with meals. Do not start this insulin until next visit. Bring it with you to clinic.   No facility-administered encounter medications on file as of 01/08/2020.   ALLERGIES: Allergies  Allergen Reactions  . Penicillins Swelling and Rash    Did it involve swelling of the face/tongue/throat, SOB, or low BP? Yes Did it involve sudden or severe rash/hives, skin  peeling, or any reaction on the inside of your mouth or nose? No Did you need to seek medical attention at a hospital or doctor's office? Yes When did it last happen?Childhood allergy If all above answers are "NO", may proceed with cephalosporin use.     VACCINATION STATUS: Immunization History  Administered Date(s) Administered  . Fluad Quad(high Dose 65+)  06/09/2019  . Influenza Split 10/23/2011  . Influenza Whole 09/04/2010  . Influenza,inj,Quad PF,6+ Mos 06/08/2013, 09/03/2014, 07/31/2015, 06/22/2016, 06/29/2018  . Pneumococcal Conjugate-13 03/05/2015  . Pneumococcal Polysaccharide-23 05/14/2011, 11/27/2013, 06/09/2019    Diabetes He presents for his follow-up diabetic visit. He has type 2 diabetes mellitus. Onset time: He was diagnosed at approximate age of 22 years. His disease course has been fluctuating. There are no hypoglycemic associated symptoms. Pertinent negatives for hypoglycemia include no confusion, headaches, pallor or seizures. Pertinent negatives for diabetes include no chest pain, no fatigue, no polydipsia, no polyphagia, no polyuria and no weakness. There are no hypoglycemic complications. Symptoms are worsening. There are no diabetic complications. Risk factors for coronary artery disease include diabetes mellitus, dyslipidemia, hypertension, male sex, obesity, sedentary lifestyle and tobacco exposure. Current diabetic treatment includes insulin injections. He is compliant with treatment most of the time. His weight is increasing steadily. He is following a generally unhealthy diet. When asked about meal planning, he reported none. He has had a previous visit with a dietitian. He never participates in exercise. His home blood glucose trend is fluctuating minimally. His breakfast blood glucose range is generally 140-180 mg/dl. His lunch blood glucose range is generally 180-200 mg/dl. His dinner blood glucose range is generally >200 mg/dl. His bedtime blood glucose range  is generally >200 mg/dl. His overall blood glucose range is >200 mg/dl. (He returns with his CGM device.  Printout shows average blood glucose of 178 over the last 2 weeks, 56% time in range, 43% above range, 1% hypoglycemia.    He is recent point-of-care A1c is 8.5%, improving from 9.3%.) Eye exam is current.  Hyperlipidemia This is a chronic problem. The current episode started more than 1 year ago. The problem is uncontrolled. Exacerbating diseases include diabetes and obesity. Pertinent negatives include no chest pain, myalgias or shortness of breath. Current antihyperlipidemic treatment includes statins. Compliance problems include adherence to diet and adherence to exercise.  Risk factors for coronary artery disease include dyslipidemia, diabetes mellitus, obesity, male sex and a sedentary lifestyle.  Hypertension This is a chronic problem. The current episode started more than 1 year ago. The problem is controlled. Pertinent negatives include no chest pain, headaches, neck pain, palpitations or shortness of breath. Risk factors for coronary artery disease include diabetes mellitus, dyslipidemia, male gender, obesity, smoking/tobacco exposure and sedentary lifestyle. Compliance problems include diet and exercise.     Review of systems    Objective:    There were no vitals taken for this visit.  Wt Readings from Last 3 Encounters:  12/20/19 (!) 389 lb (176.4 kg)  12/06/19 (!) 385 lb 9.6 oz (174.9 kg)  10/12/19 (!) 380 lb (172.4 kg)       Diabetic Labs (most recent): Lab Results  Component Value Date   HGBA1C 8.5 (A) 12/06/2019   HGBA1C 9.3 (H) 07/26/2019   HGBA1C 8.4 (H) 04/18/2019   Recent Results (from the past 2160 hour(s))  HgB A1c     Status: Abnormal   Collection Time: 12/06/19  3:33 PM  Result Value Ref Range   Hemoglobin A1C 8.5 (A) 4.0 - 5.6 %   HbA1c POC (<> result, manual entry)     HbA1c, POC (prediabetic range)     HbA1c, POC (controlled diabetic range)      Lipid Panel     Component Value Date/Time   CHOL 165 07/12/2017 1324   TRIG 100 07/12/2017 1324   HDL 46 07/12/2017 1324   CHOLHDL 4.0 06/26/2016 1403  VLDL 22 06/26/2016 1403   LDLCALC 99 07/12/2017 1324     Assessment & Plan:   1. Uncontrolled type 2 diabetes mellitus with complication, with long-term current use of insulin ([place])  -His diabetes is  complicated by morbid obesity and sleep apnea and patient remains at a high risk for more acute and chronic complications of diabetes which include CAD, CVA, CKD, retinopathy, and neuropathy. These are all discussed in detail with the patient.  [place] reports continued persistent hyperglycemia postprandially.   - recent analyses of his CGM device showed average blood glucose of 178 over the last 2 weeks, 56% time in range, 43% above range, 1% hypoglycemia.    He is recent point-of-care A1c is 8.5%, improving from 9.3%. -  Glucose logs and insulin administration records pertaining to this visit,  to be scanned into patient's records.  Recent labs reviewed.   - I have re-counseled the patient on diet management and weight loss  by adopting a carbohydrate restricted / protein rich  Diet.   - he  admits there is a room for improvement in his diet and drink choices. -  Suggestion is made for him to avoid simple carbohydrates  from his diet including Cakes, Sweet Desserts / Pastries, Ice Cream, Soda (diet and regular), Sweet Tea, Candies, Chips, Cookies, Sweet Pastries,  Store Bought Juices, Alcohol in Excess of  1-2 drinks a day, Artificial Sweeteners, Coffee Creamer, and "Sugar-free" Products. This will help patient to have stable blood glucose profile and potentially avoid unintended weight gain.   - Patient is advised to stick to a routine mealtimes to eat 3 meals  a day and avoid unnecessary snacks ( to snack only to correct hypoglycemia).   - I have approached patient with the following individualized plan to manage diabetes and  patient agrees.   -He will continue to benefit from  U500. -He is advised to increase his insulin U50 0 to 70 units with breakfast and supper, and keep at 60 units with supper if blood glucose readings are above 90 mg per DL.   -He is advised to continue to use his CGM device at all times.  He is advised to avoid any other kinds of insulin while using insulin U500.  -He is advised to shift some of his carbs from supper to lunch for proper distribution of his caloric exposure across the day. -Patient is encouraged to call clinic for blood glucose levels less than 70 or above 300 mg /dl. -He has finished his current supplies of Victoza, has Ozempic pens at home.  He is advised to use Ozempic 0.5 mg subcutaneously weekly in place of Victoza.   -He is not a candidate for Metformin treatment due to concurrent CKD. - He has shown some benefit from glipizide treatment.  He is advised to continue glipizide 5 mg p.o. twice daily with breakfast and with supper.    - Patient specific target  for A1c; LDL, HDL, Triglycerides, and  Waist Circumference were discussed in detail.  2) BP/HTN :  he is advised to home monitor blood pressure and report if > 140/90 on 2 separate readings.     He is advised to continue his current blood pressure medications, will be considered for low-dose lisinopril next visit.   3) Lipids/HPL: His recent lipid panel showed uncontrolled LDL at 99.  He is advised to continue atorvastatin 80 mg p.o. nightly.   Side effects and precautions discussed with him.    4)  Weight/Diet: His  BMI is [id number] complicating the care of his diabetes.  He is a candidate for modest weight loss.    CDE consult in progress, exercise, and carbohydrates information provided.  5) Chronic Care/Health Maintenance:  -Patient is on ACEI/ARB and Statin medications and encouraged to continue to follow up with Ophthalmology, Podiatrist at least yearly or according to recommendations, and advised to  stay  away from smoking. I have recommended yearly flu vaccine and pneumonia vaccination at least every 5 years; moderate intensity exercise for up to 150 minutes weekly; and  sleep for at least 7 hours a day.  - I advised patient to maintain close follow up with [name], MD for primary care needs.   - Time spent on this patient care encounter:  35 min, of which >50% was spent in  counseling and the rest reviewing his  current and  previous labs/studies ( including abstraction from other facilities),  previous treatments, his blood glucose readings, and medications' doses and developing a plan for long-term care based on the latest recommendations for standards of care; and documenting his care.  Kenneth Patrick participated in the discussions, expressed understanding, and voiced agreement with the above plans.  All questions were answered to his satisfaction. he is encouraged to contact clinic should he have any questions or concerns prior to his return visit.  Follow up plan: -Return in about 3 months (around 04/08/2020) for Bring Meter and Logs- A1c in Office, Follow up with Pre-visit Labs.  Glade Lloyd, MD Phone: [phone]  Fax: [phone]   -  This note was partially dictated with voice recognition software. Similar sounding words can be transcribed inadequately or may not  be corrected upon review.  01/08/2020, 10:19 PM

## 2020-01-10 DIAGNOSIS — E113311 Type 2 diabetes mellitus with moderate nonproliferative diabetic retinopathy with macular edema, right eye: Secondary | ICD-10-CM | POA: Diagnosis not present

## 2020-01-10 DIAGNOSIS — H31091 Other chorioretinal scars, right eye: Secondary | ICD-10-CM | POA: Diagnosis not present

## 2020-01-10 DIAGNOSIS — Z970 Presence of artificial eye: Secondary | ICD-10-CM | POA: Diagnosis not present

## 2020-01-10 DIAGNOSIS — H43821 Vitreomacular adhesion, right eye: Secondary | ICD-10-CM | POA: Diagnosis not present

## 2020-01-17 DIAGNOSIS — N183 Chronic kidney disease, stage 3 unspecified: Secondary | ICD-10-CM | POA: Diagnosis not present

## 2020-01-17 DIAGNOSIS — E113599 Type 2 diabetes mellitus with proliferative diabetic retinopathy without macular edema, unspecified eye: Secondary | ICD-10-CM | POA: Diagnosis not present

## 2020-01-17 DIAGNOSIS — R030 Elevated blood-pressure reading, without diagnosis of hypertension: Secondary | ICD-10-CM | POA: Diagnosis not present

## 2020-01-17 DIAGNOSIS — Z6841 Body Mass Index (BMI) 40.0 and over, adult: Secondary | ICD-10-CM | POA: Diagnosis not present

## 2020-01-17 DIAGNOSIS — Z299 Encounter for prophylactic measures, unspecified: Secondary | ICD-10-CM | POA: Diagnosis not present

## 2020-01-17 DIAGNOSIS — J449 Chronic obstructive pulmonary disease, unspecified: Secondary | ICD-10-CM | POA: Diagnosis not present

## 2020-01-17 DIAGNOSIS — E1122 Type 2 diabetes mellitus with diabetic chronic kidney disease: Secondary | ICD-10-CM | POA: Diagnosis not present

## 2020-02-28 ENCOUNTER — Other Ambulatory Visit: Payer: Self-pay | Admitting: "Endocrinology

## 2020-03-14 ENCOUNTER — Telehealth: Payer: Self-pay

## 2020-03-14 NOTE — Telephone Encounter (Signed)
[employer] called stating pt's ozempic has been approved through 03-13-21.

## 2020-04-09 DIAGNOSIS — E1165 Type 2 diabetes mellitus with hyperglycemia: Secondary | ICD-10-CM | POA: Diagnosis not present

## 2020-04-09 DIAGNOSIS — E118 Type 2 diabetes mellitus with unspecified complications: Secondary | ICD-10-CM | POA: Diagnosis not present

## 2020-04-09 DIAGNOSIS — Z794 Long term (current) use of insulin: Secondary | ICD-10-CM | POA: Diagnosis not present

## 2020-04-10 LAB — LIPID PANEL
Cholesterol: 165 mg/dL (ref <200)
HDL: 39 mg/dL — ABNORMAL LOW (ref >=40)
LDL Cholesterol (Calc): 104 mg/dL (calc) — ABNORMAL HIGH
Non-HDL Cholesterol (Calc): 126 mg/dL (calc) (ref <130)
Total CHOL/HDL Ratio: 4.2 (calc) (ref <5.0)
Triglycerides: 120 mg/dL (ref <150)

## 2020-04-10 LAB — MICROALBUMIN / CREATININE URINE RATIO
Creatinine, Urine: 146 mg/dL (ref 20–320)
Microalb Creat Ratio: 12 mcg/mg creat (ref <30)
Microalb, Ur: 1.7 mg/dL

## 2020-04-10 LAB — COMPLETE METABOLIC PANEL WITH GFR
AG Ratio: 1.6 (calc) (ref 1.0–2.5)
ALT: 23 U/L (ref 9–46)
AST: 25 U/L (ref 10–35)
Albumin: 4 g/dL (ref 3.6–5.1)
Alkaline phosphatase (APISO): 156 U/L — ABNORMAL HIGH (ref 35–144)
BUN/Creatinine Ratio: 14 (calc) (ref 6–22)
BUN: 21 mg/dL (ref 7–25)
CO2: 26 mmol/L (ref 20–32)
Calcium: 9.2 mg/dL (ref 8.6–10.3)
Chloride: 102 mmol/L (ref 98–110)
Creat: 1.51 mg/dL — ABNORMAL HIGH (ref 0.70–1.25)
GFR, Est African American: 54 mL/min/{1.73_m2} — ABNORMAL LOW (ref >=60)
GFR, Est Non African American: 47 mL/min/{1.73_m2} — ABNORMAL LOW (ref >=60)
Globulin: 2.5 g/dL (calc) (ref 1.9–3.7)
Glucose, Bld: 176 mg/dL — ABNORMAL HIGH (ref 65–99)
Potassium: 4.1 mmol/L (ref 3.5–5.3)
Sodium: 140 mmol/L (ref 135–146)
Total Bilirubin: 0.8 mg/dL (ref 0.2–1.2)
Total Protein: 6.5 g/dL (ref 6.1–8.1)

## 2020-04-10 LAB — TSH: TSH: 2.63 mIU/L (ref 0.40–4.50)

## 2020-04-10 LAB — T4, FREE: Free T4: 1.1 ng/dL (ref 0.8–1.8)

## 2020-04-11 ENCOUNTER — Ambulatory Visit: Payer: Medicare Other | Admitting: Cardiology

## 2020-04-14 ENCOUNTER — Other Ambulatory Visit: Payer: Self-pay | Admitting: Cardiology

## 2020-04-14 DIAGNOSIS — I10 Essential (primary) hypertension: Secondary | ICD-10-CM

## 2020-04-14 DIAGNOSIS — R079 Chest pain, unspecified: Secondary | ICD-10-CM

## 2020-04-14 DIAGNOSIS — R0609 Other forms of dyspnea: Secondary | ICD-10-CM

## 2020-04-15 ENCOUNTER — Other Ambulatory Visit: Payer: Self-pay

## 2020-04-15 ENCOUNTER — Encounter: Payer: Self-pay | Admitting: Cardiology

## 2020-04-15 ENCOUNTER — Ambulatory Visit: Payer: Medicare Other | Admitting: Cardiology

## 2020-04-15 VITALS — BP 149/82 | HR 85 | Resp 17 | Ht 76.0 in | Wt 379.0 lb

## 2020-04-15 DIAGNOSIS — IMO0002 Reserved for concepts with insufficient information to code with codable children: Secondary | ICD-10-CM

## 2020-04-15 DIAGNOSIS — J449 Chronic obstructive pulmonary disease, unspecified: Secondary | ICD-10-CM

## 2020-04-15 DIAGNOSIS — I10 Essential (primary) hypertension: Secondary | ICD-10-CM

## 2020-04-15 DIAGNOSIS — R0609 Other forms of dyspnea: Secondary | ICD-10-CM

## 2020-04-15 NOTE — Progress Notes (Signed)
Patient referred by [name], MD for shortness of breath  Subjective:   Kenneth Patrick, male    DOB: 1951-08-07, 69 y.o.   MRN: [id number]   Chief Complaint  Patient presents with  . Shortness of Breath  . Follow-up    6 month    HPI  69 year old Caucasian male with hypertension, hyperlipidemia, type 2 diabetes mellitus, former smoker, COPD, OSA, obesity, shortness of breath.  His exertional dyspnea persists, with no significant change since last visit. Again, he denies any chest pain.Blood pressure is elevated.   Current Outpatient Medications on File Prior to Visit  Medication Sig Dispense Refill  . albuterol (VENTOLIN HFA) 108 (90 BASE) MCG/ACT inhaler Inhale 2 puffs into the lungs every 6 (six) hours as needed for wheezing or shortness of breath.     [name] atorvastatin (LIPITOR) 80 MG tablet Take 80 mg by mouth at bedtime.     [name] CARTIA XT 120 MG 24 hr capsule Take 1 capsule by mouth once daily 90 capsule 1  . Continuous Blood Gluc Sensor (FREESTYLE LIBRE SENSOR SYSTEM) MISC Use one sensor every 10 days. 3 each 2  . Fluticasone-Umeclidin-Vilant (TRELEGY ELLIPTA) 100-62.5-25 MCG/INH AEPB Inhale 1 puff into the lungs daily. 60 each 11  . furosemide (LASIX) 40 MG tablet Take 40 mg by mouth daily as needed for fluid.     [name] gabapentin (NEURONTIN) 300 MG capsule Take 300 mg by mouth 2 (two) times daily.     [name] glipiZIDE (GLUCOTROL) 5 MG tablet TAKE 1 TABLET BY MOUTH TWICE DAILY BEFORE MEAL(S) 60 tablet 3  . insulin regular human CONCENTRATED (HUMULIN R U-500 KWIKPEN) 500 UNIT/ML kwikpen Inject 60-70 Units into the skin 3 (three) times daily with meals. Inject 70 units with breakfast and supper, and 60 units with supper if BG is above 90 4 pen 2  . oxymetazoline (AFRIN) 0.05 % nasal spray Place 1-2 sprays into both nostrils 2 (two) times daily as needed for congestion.     . Semaglutide,0.25 or 0.5MG/DOS, 2 MG/1.5ML SOPN Inject 0.5 mg into the skin once a week.    . sertraline  (ZOLOFT) 100 MG tablet Take 200 mg by mouth daily.     [name] spironolactone (ALDACTONE) 25 MG tablet Take 1 tablet by mouth once daily 90 tablet 2  . tobramycin (TOBREX) 0.3 % ophthalmic solution Place 1 drop into the right eye See admin instructions. Instill 1 drop into the right eye 3 times daily, the day before, the day of and the day after eye injection done every 6 weeks.     No current facility-administered medications on file prior to visit.    Cardiovascular studies:  EKG 04/15/2020: Sinus rhythm 87 bpm Left axis deviation Low voltage in precordial leads.   Echocardiogram 05/31/2019:  1. The left ventricle has normal systolic function with an ejection fraction of 60-65%. The cavity size was normal. There is mild concentric left ventricular hypertrophy. Left ventricular diastolic Doppler parameters are consistent with impaired  relaxation. Elevated left ventricular end-diastolic pressure No evidence of left ventricular regional wall motion abnormalities.  2. The right ventricle has normal systolic function. The cavity was normal. There is no increase in right ventricular wall thickness.  3. The mitral valve is grossly normal.  4. The tricuspid valve is grossly normal.  5. The aortic valve is tricuspid.  6. The aorta is normal unless otherwise noted.  7. The inferior vena cava was dilated in size with >50% respiratory variability.  8. The interatrial septum appears to be lipomatous.   Recent labs: 07/03/2019: Glucose 94, BUN/Cr 18/1.49. EGFR 48. Na/K 142/4.1.  H/H 12/36. MCV 87. Platelets 219 HbA1C 9.3%  07/12/2017: Chol 165, TG 100, HDL 46, LDL 99  Review of Systems  Eyes: Negative for visual disturbance.  Cardiovascular: Positive for chest pain, dyspnea on exertion and leg swelling. Negative for palpitations and syncope.  Respiratory: Positive for shortness of breath. Negative for cough.   Endocrine: Negative for cold intolerance.  Skin: Negative for itching and rash.    Musculoskeletal: Negative for myalgias.  Psychiatric/Behavioral: The patient is not nervous/anxious.   All other systems reviewed and are negative.       Vitals:   04/15/20 1500  BP: (!) 149/82  Pulse: 85  Resp: 17  SpO2: 94%     Body mass index is 46.13 kg/m. Filed Weights   04/15/20 1500  Weight: (!) 379 lb (171.9 kg)     Objective:   Physical Exam Vitals and nursing note reviewed.  Constitutional:      General: He is not in acute distress.    Appearance: He is well-developed.     Comments: Morbidly obese  Neck:     Vascular: No JVD.  Cardiovascular:     Rate and Rhythm: Normal rate and regular rhythm.     Pulses: Intact distal pulses.     Heart sounds: No murmur heard.   Pulmonary:     Effort: Pulmonary effort is normal.     Breath sounds: Normal breath sounds. No wheezing or rales.  Abdominal:     General: Bowel sounds are normal. There is distension (Protuberant abdomen).     Palpations: Abdomen is soft.     Tenderness: There is no rebound.  Neurological:     Cranial Nerves: No cranial nerve deficit.           Assessment & Recommendations:   69 year old Caucasian male with hypertension, hyperlipidemia, type 2 diabetes mellitus, former smoker, COPD, OSA, obesity, referred for evaluation of shortness of breath.   Shortness of breath: Likely multifactorial-including obesity, deconditioning, COPD, OSA. In addition, angina equivalent due to coronary artery disease cannot be excluded.  As in the past, I discussed the options for investigation as well as medical and invasive management with the patient.  Given his BMI of 47, he has high probability of false positive abnormal stress test, even if performed over 2 days.  Nonetheless, patient is reluctant to pursue any invasive management at this time. Thus, do not recommend stress test at this time.   He would like to continue current medical management at this time with diltiazem (increased t 240 mg  daily and spironolactone 25 mg daily for antianginal, antihypertensive, as well as diuretic properties.   Emphasized continued efforts at weight loss. He is not interested in pursuing surgical weight loss.  Hypertension: Increased diltiazem to 240 mg daily.  Type 2 diabetes mellitus: Uncontrolled, with CKD 3.  Managed by endocrinologist Dr. Dorris Fetch.  COPD, OSA: Continue management as per pulmonology.  F/u in 1 year  Nigel Mormon, MD [facility] Pager: [phone] Office: [phone] If no answer Cell [phone]

## 2020-04-16 ENCOUNTER — Ambulatory Visit (INDEPENDENT_AMBULATORY_CARE_PROVIDER_SITE_OTHER): Payer: Medicare Other | Admitting: "Endocrinology

## 2020-04-16 ENCOUNTER — Encounter: Payer: Self-pay | Admitting: "Endocrinology

## 2020-04-16 VITALS — BP 130/74 | HR 80 | Ht 76.0 in | Wt 380.0 lb

## 2020-04-16 DIAGNOSIS — E118 Type 2 diabetes mellitus with unspecified complications: Secondary | ICD-10-CM

## 2020-04-16 DIAGNOSIS — E782 Mixed hyperlipidemia: Secondary | ICD-10-CM | POA: Diagnosis not present

## 2020-04-16 DIAGNOSIS — I10 Essential (primary) hypertension: Secondary | ICD-10-CM | POA: Diagnosis not present

## 2020-04-16 DIAGNOSIS — Z794 Long term (current) use of insulin: Secondary | ICD-10-CM

## 2020-04-16 DIAGNOSIS — IMO0002 Reserved for concepts with insufficient information to code with codable children: Secondary | ICD-10-CM

## 2020-04-16 DIAGNOSIS — E1165 Type 2 diabetes mellitus with hyperglycemia: Secondary | ICD-10-CM | POA: Diagnosis not present

## 2020-04-16 LAB — POCT GLYCOSYLATED HEMOGLOBIN (HGB A1C): Hemoglobin A1C: 9.2 % — AB (ref 4.0–5.6)

## 2020-04-16 MED ORDER — HUMULIN R U-500 KWIKPEN 500 UNIT/ML ~~LOC~~ SOPN: PEN_INJECTOR | SUBCUTANEOUS | 2 refills | Status: AC

## 2020-04-16 NOTE — Patient Instructions (Signed)

## 2020-04-16 NOTE — Progress Notes (Signed)
04/16/2020   Endocrinology follow-up note    Subjective:    Patient ID: Kenneth Patrick, male    DOB: Feb 24, 1951, PCP [name], MD   Past Medical History:  Diagnosis Date  . Cancer ([place])    basal cell   . COPD (chronic obstructive pulmonary disease) (HCC)    uses inhalers  . Depression   . Diabetic neuropathy ([place])   . DM2 (diabetes mellitus, type 2) (HCC)    insulin dependent  . Dyspnea    with exertion  . Hypercholesterolemia   . Low back pain    no meds  . Respiratory infection   . Sleep apnea    uses CPAP   Past Surgical History:  Procedure Laterality Date  . APPLICATION OF A-CELL OF HEAD/NECK Left 11/01/2018   Procedure: APPLICATION OF A-CELL TO SCALP WOUND;  Surgeon: Wallace Going, DO;  Location: [facility];  Service: Plastics;  Laterality: Left;  . CATARACT EXTRACTION W/PHACO Right 04/21/2019   Procedure: CATARACT EXTRACTION PHACO AND INTRAOCULAR LENS PLACEMENT RIGHT EYE;  Surgeon: Baruch Goldmann, MD;  Location: [facility];  Service: Ophthalmology;  Laterality: Right;  right  . CHOLECYSTECTOMY    . DEBRIDEMENT AND CLOSURE WOUND Left 11/01/2018   Procedure: Debridement scalp wound;  Surgeon: Wallace Going, DO;  Location: [facility];  Service: Plastics;  Laterality: Left;  . left eye enucleation following trauma     Social History   Socioeconomic History  . Marital status: Married    Spouse name: Not on file  . Number of children: 2  . Years of education: Not on file  . Highest education level: Not on file  Occupational History  . Occupation: retired Engineer, structural  Tobacco Use  . Smoking status: Former Smoker    Packs/day: 1.50    Years: 20.00    Pack years: 30.00    Types: Cigarettes    Start date: 47    Quit date: 10/05/2004    Years since quitting: 15.5  . Smokeless tobacco: Never Used  Vaping Use  . Vaping Use: Never used  Substance and Sexual Activity  . Alcohol use: No    Alcohol/week: 0.0  standard drinks  . Drug use: No    Comment: stopped smoking marijuana in 2006  . Sexual activity: Not on file  Other Topics Concern  . Not on file  Social History Narrative  . Not on file   Social Determinants of Health   Financial Resource Strain:   . Difficulty of Paying Living Expenses:   Food Insecurity:   . Worried About Charity fundraiser in the Last Year:   . Arboriculturist in the Last Year:   Transportation Needs:   . Film/video editor (Medical):   [name] Lack of Transportation (Non-Medical):   Physical Activity:   . Days of Exercise per Week:   . Minutes of Exercise per Session:   Stress:   . Feeling of Stress :   Social Connections:   . Frequency of Communication with Friends and Family:   . Frequency of Social Gatherings with Friends and Family:   . Attends Religious Services:   . Active Member of Clubs or Organizations:   . Attends Archivist Meetings:   [name] Marital Status:    Outpatient Encounter Medications as of 04/16/2020  Medication Sig  . diltiazem (TIAZAC) 240 MG 24 hr capsule Take 1 capsule (240 mg total) by mouth daily.  [name]  albuterol (VENTOLIN HFA) 108 (90 BASE) MCG/ACT inhaler Inhale 2 puffs into the lungs every 6 (six) hours as needed for wheezing or shortness of breath.   [name] atorvastatin (LIPITOR) 80 MG tablet Take 80 mg by mouth at bedtime.   . Continuous Blood Gluc Sensor (FREESTYLE LIBRE SENSOR SYSTEM) MISC Use one sensor every 10 days.  . Fluticasone-Umeclidin-Vilant (TRELEGY ELLIPTA) 100-62.5-25 MCG/INH AEPB Inhale 1 puff into the lungs daily.  . furosemide (LASIX) 40 MG tablet Take 40 mg by mouth daily as needed for fluid.   [name] gabapentin (NEURONTIN) 300 MG capsule Take 300 mg by mouth 2 (two) times daily.   [name] glipiZIDE (GLUCOTROL) 5 MG tablet TAKE 1 TABLET BY MOUTH TWICE DAILY BEFORE MEAL(S)  . insulin regular human CONCENTRATED (HUMULIN R U-500 KWIKPEN) 500 UNIT/ML kwikpen Inject 80 units with breakfast and Lunch, and 60 units with  supper if BG is above 90  . oxymetazoline (AFRIN) 0.05 % nasal spray Place 1-2 sprays into both nostrils 2 (two) times daily as needed for congestion.   . Semaglutide,0.25 or 0.5MG /DOS, 2 MG/1.5ML SOPN Inject 0.5 mg into the skin once a week.  . sertraline (ZOLOFT) 100 MG tablet Take 200 mg by mouth daily.   [name] spironolactone (ALDACTONE) 25 MG tablet Take 1 tablet by mouth once daily  . tobramycin (TOBREX) 0.3 % ophthalmic solution Place 1 drop into the right eye See admin instructions. Instill 1 drop into the right eye 3 times daily, the day before, the day of and the day after eye injection done every 6 weeks.  . [DISCONTINUED] CARTIA XT 120 MG 24 hr capsule Take 1 capsule by mouth once daily  . [DISCONTINUED] insulin regular human CONCENTRATED (HUMULIN R U-500 KWIKPEN) 500 UNIT/ML kwikpen Inject 60-70 Units into the skin 3 (three) times daily with meals. Inject 70 units with breakfast and supper, and 60 units with supper if BG is above 90   No facility-administered encounter medications on file as of 04/16/2020.   ALLERGIES: Allergies  Allergen Reactions  . Penicillins Swelling and Rash    Did it involve swelling of the face/tongue/throat, SOB, or low BP? Yes Did it involve sudden or severe rash/hives, skin peeling, or any reaction on the inside of your mouth or nose? No Did you need to seek medical attention at a hospital or doctor's office? Yes When did it last happen?Childhood allergy If all above answers are "NO", may proceed with cephalosporin use.     VACCINATION STATUS: Immunization History  Administered Date(s) Administered  . Fluad Quad(high Dose 65+) 06/09/2019  . Influenza Split 10/23/2011  . Influenza Whole 09/04/2010  . Influenza,inj,Quad PF,6+ Mos 06/08/2013, 09/03/2014, 07/31/2015, 06/22/2016, 06/29/2018  . Pneumococcal Conjugate-13 03/05/2015  . Pneumococcal Polysaccharide-23 05/14/2011, 11/27/2013, 06/09/2019    Diabetes He presents for his follow-up  diabetic visit. He has type 2 diabetes mellitus. Onset time: He was diagnosed at approximate age of 36 years. His disease course has been worsening. There are no hypoglycemic associated symptoms. Pertinent negatives for hypoglycemia include no confusion, headaches, pallor or seizures. Pertinent negatives for diabetes include no chest pain, no fatigue, no polydipsia, no polyphagia, no polyuria and no weakness. There are no hypoglycemic complications. Symptoms are worsening. There are no diabetic complications. Risk factors for coronary artery disease include diabetes mellitus, dyslipidemia, hypertension, male sex, obesity, sedentary lifestyle and tobacco exposure. Current diabetic treatment includes insulin injections. He is compliant with treatment most of the time. His weight is increasing steadily. He is following a generally unhealthy diet.  When asked about meal planning, he reported none. He has had a previous visit with a dietitian. He never participates in exercise. His home blood glucose trend is increasing steadily. His breakfast blood glucose range is generally 140-180 mg/dl. His lunch blood glucose range is generally >200 mg/dl. His dinner blood glucose range is generally >200 mg/dl. His bedtime blood glucose range is generally >200 mg/dl. His overall blood glucose range is >200 mg/dl. (He returns with his CGM device.  Printouts show that his average blood glucose is 218, 33% time range, 65% above range.  He does not have any major hypoglycemia.  His point-of-care A1c is increasing to 9.2% from 8.5%.  ) Eye exam is current.  Hyperlipidemia This is a chronic problem. The current episode started more than 1 year ago. The problem is uncontrolled. Exacerbating diseases include diabetes and obesity. Pertinent negatives include no chest pain, myalgias or shortness of breath. Current antihyperlipidemic treatment includes statins. Compliance problems include adherence to diet and adherence to exercise.  Risk  factors for coronary artery disease include dyslipidemia, diabetes mellitus, obesity, male sex and a sedentary lifestyle.  Hypertension This is a chronic problem. The current episode started more than 1 year ago. The problem is controlled. Pertinent negatives include no chest pain, headaches, neck pain, palpitations or shortness of breath. Risk factors for coronary artery disease include diabetes mellitus, dyslipidemia, male gender, obesity, smoking/tobacco exposure and sedentary lifestyle. Compliance problems include diet and exercise.     Review of systems    Objective:    BP 130/74   Pulse 80   Ht 6\' 4"  (1.93 m)   Wt (!) 380 lb (172.4 kg)   BMI 46.26 kg/m   Wt Readings from Last 3 Encounters:  04/16/20 (!) 380 lb (172.4 kg)  04/15/20 (!) 379 lb (171.9 kg)  12/20/19 (!) 389 lb (176.4 kg)    Physical Exam- Limited  Constitutional:  Body mass index is 46.26 kg/m. , not in acute distress, normal state of mind Eyes:  EOMI, no exophthalmos Neck: Supple Thyroid: No gross goiter Respiratory: Adequate breathing efforts Musculoskeletal: no gross deformities, strength intact in all four extremities, no gross restriction of joint movements Skin:  no rashes, no hyperemia Neurological: no tremor with outstretched hands    Diabetic Labs (most recent): Lab Results  Component Value Date   HGBA1C 9.2 (A) 04/16/2020   HGBA1C 8.5 (A) 12/06/2019   HGBA1C 9.3 (H) 07/26/2019   Recent Results (from the past 2160 hour(s))  COMPLETE METABOLIC PANEL WITH GFR     Status: Abnormal   Collection Time: 04/09/20 11:54 AM  Result Value Ref Range   Glucose, Bld 176 (H) 65 - 99 mg/dL    Comment: .            Fasting reference interval . For someone without known diabetes, a glucose value >125 mg/dL indicates that they may have diabetes and this should be confirmed with a follow-up test. .    BUN 21 7 - 25 mg/dL   Creat 1.51 (H) 0.70 - 1.25 mg/dL    Comment: For patients >27 years of age, the  reference limit for Creatinine is approximately 13% higher for people identified as African-American. .    GFR, Est Non African American 47 (L) > OR = 60 mL/min/1.34m2   GFR, Est African American 54 (L) > OR = 60 mL/min/1.39m2   BUN/Creatinine Ratio 14 6 - 22 (calc)   Sodium 140 135 - 146 mmol/L   Potassium 4.1 3.5 -  5.3 mmol/L   Chloride 102 98 - 110 mmol/L   CO2 26 20 - 32 mmol/L   Calcium 9.2 8.6 - 10.3 mg/dL   Total Protein 6.5 6.1 - 8.1 g/dL   Albumin 4.0 3.6 - 5.1 g/dL   Globulin 2.5 1.9 - 3.7 g/dL (calc)   AG Ratio 1.6 1.0 - 2.5 (calc)   Total Bilirubin 0.8 0.2 - 1.2 mg/dL   Alkaline phosphatase (APISO) 156 (H) 35 - 144 U/L   AST 25 10 - 35 U/L   ALT 23 9 - 46 U/L  Microalbumin / creatinine urine ratio     Status: None   Collection Time: 04/09/20 11:54 AM  Result Value Ref Range   Creatinine, Urine 146 20 - 320 mg/dL   Microalb, Ur 1.7 mg/dL    Comment: Reference Range Not established    Microalb Creat Ratio 12 <30 mcg/mg creat    Comment: . The ADA defines abnormalities in albumin excretion as follows: [name] Category         Result (mcg/mg creatinine) . Normal                    <30 Microalbuminuria         30-299  Clinical albuminuria   > OR = 300 . The ADA recommends that at least two of three specimens collected within a 3-6 month period be abnormal before considering a patient to be within a diagnostic category.   Lipid panel     Status: Abnormal   Collection Time: 04/09/20 11:54 AM  Result Value Ref Range   Cholesterol 165 <200 mg/dL   HDL 39 (L) > OR = 40 mg/dL   Triglycerides 120 <150 mg/dL   LDL Cholesterol (Calc) 104 (H) mg/dL (calc)    Comment: Reference range: <100 . Desirable range <100 mg/dL for primary prevention;   <70 mg/dL for patients with CHD or diabetic patients  with > or = 2 CHD risk factors. [name] LDL-C is now calculated using the Martin-Hopkins  calculation, which is a validated novel method providing  better accuracy than the  Friedewald equation in the  estimation of LDL-C.  Cresenciano Genre et al. Annamaria Helling. 6948;546(27): 2061-2068  (http://education.[facility].com/faq/FAQ164)    Total CHOL/HDL Ratio 4.2 <5.0 (calc)   Non-HDL Cholesterol (Calc) 126 <130 mg/dL (calc)    Comment: For patients with diabetes plus 1 major ASCVD risk  factor, treating to a non-HDL-C goal of <100 mg/dL  (LDL-C of <70 mg/dL) is considered a therapeutic  option.   TSH     Status: None   Collection Time: 04/09/20 11:54 AM  Result Value Ref Range   TSH 2.63 0.40 - 4.50 mIU/L  T4, free     Status: None   Collection Time: 04/09/20 11:54 AM  Result Value Ref Range   Free T4 1.1 0.8 - 1.8 ng/dL  HgB A1c     Status: Abnormal   Collection Time: 04/16/20  2:51 PM  Result Value Ref Range   Hemoglobin A1C 9.2 (A) 4.0 - 5.6 %   HbA1c POC (<> result, manual entry)     HbA1c, POC (prediabetic range)     HbA1c, POC (controlled diabetic range)     Lipid Panel     Component Value Date/Time   CHOL 165 04/09/2020 1154   CHOL 165 07/12/2017 1324   TRIG 120 04/09/2020 1154   HDL 39 (L) 04/09/2020 1154   HDL 46 07/12/2017 1324   CHOLHDL 4.2 04/09/2020 1154  VLDL 22 06/26/2016 1403   LDLCALC 104 (H) 04/09/2020 1154     Assessment & Plan:   1. Uncontrolled type 2 diabetes mellitus with complication, with long-term current use of insulin ([place])  -His diabetes is  complicated by morbid obesity and sleep apnea and patient remains at a high risk for more acute and chronic complications of diabetes which include CAD, CVA, CKD, retinopathy, and neuropathy. These are all discussed in detail with the patient.  He returns with his CGM device.  Printouts show that his average blood glucose is 218, 33% time in  range, 65% above range.  He does not have any major hypoglycemia.  His point-of-care A1c is increasing to 9.2% from 8.5%.   -  Glucose logs and insulin administration records pertaining to this visit,  to be scanned into patient's records.   Recent labs reviewed.   - I have re-counseled the patient on diet management and weight loss  by adopting a carbohydrate restricted / protein rich  Diet.   - he  admits there is a room for improvement in his diet and drink choices. -  Suggestion is made for him to avoid simple carbohydrates  from his diet including Cakes, Sweet Desserts / Pastries, Ice Cream, Soda (diet and regular), Sweet Tea, Candies, Chips, Cookies, Sweet Pastries,  Store Bought Juices, Alcohol in Excess of  1-2 drinks a day, Artificial Sweeteners, Coffee Creamer, and "Sugar-free" Products. This will help patient to have stable blood glucose profile and potentially avoid unintended weight gain.   - Patient is advised to stick to a routine mealtimes to eat 3 meals  a day and avoid unnecessary snacks ( to snack only to correct hypoglycemia).   - I have approached patient with the following individualized plan to manage diabetes and patient agrees.   -He will continue to benefit from  U500. -He is advised to increase his insulin U50 0 to 80 units with breakfast, 80 units with lunch, and 60 units with supper  if blood glucose readings are above 90 mg per DL.   -He is advised to continue to use his CGM device at all times.  He is advised to avoid any other kinds of insulin while using insulin U500.  -He is advised to shift some of his carbs from supper to lunch for proper distribution of his caloric exposure across the day. -Patient is encouraged to call clinic for blood glucose levels less than 70 or above 300 mg /dl. -He is advised to continue Ozempic 0.5 mg subcutaneously weekly, glipizide 5 mg p.o. twice daily.    - Patient specific target  for A1c; LDL, HDL, Triglycerides, and  Waist Circumference were discussed in detail.  2) BP/HTN :   -His blood pressure is controlled to target.    He is advised to continue his current blood pressure medications, will be considered for low-dose lisinopril next visit.   3)  Lipids/HPL: His recent lipid panel showed uncontrolled LDL at 99.  He is advised to continue atorvastatin 80 mg p.o. nightly.   Side effects and precautions discussed with him.    4)  Weight/Diet: His BMI is 82-[id number] complicating the care of his diabetes.  He is a candidate for modest weight loss.    CDE consult in progress, exercise, and carbohydrates information provided.  5) Chronic Care/Health Maintenance:  -Patient is on ACEI/ARB and Statin medications and encouraged to continue to follow up with Ophthalmology, Podiatrist at least yearly or according to recommendations, and advised to  stay away from smoking. I have recommended yearly flu vaccine and pneumonia vaccination at least every 5 years; moderate intensity exercise for up to 150 minutes weekly; and  sleep for at least 7 hours a day.  - I advised patient to maintain close follow up with [name], MD for primary care needs.   - Time spent on this patient care encounter:  35 min, of which > 50% was spent in  counseling and the rest reviewing his blood glucose logs , discussing his hypoglycemia and hyperglycemia episodes, reviewing his current and  previous labs / studies  ( including abstraction from other facilities) and medications  doses and developing a  long term treatment plan and documenting his care.   Please refer to Patient Instructions for Blood Glucose Monitoring and Insulin/Medications Dosing Guide"  in media tab for additional information. Please  also refer to " Patient Self Inventory" in the Media  tab for reviewed elements of pertinent patient history.  Kenneth Patrick participated in the discussions, expressed understanding, and voiced agreement with the above plans.  All questions were answered to his satisfaction. he is encouraged to contact clinic should he have any questions or concerns prior to his return visit.  Follow up plan: -Return in about 3 months (around 07/17/2020) for Bring Meter and Logs- A1c in  Office.  Glade Lloyd, MD Phone: [phone]  Fax: [phone]   -  This note was partially dictated with voice recognition software. Similar sounding words can be transcribed inadequately or may not  be corrected upon review.  04/16/2020, 6:00 PM

## 2020-04-26 DIAGNOSIS — Z299 Encounter for prophylactic measures, unspecified: Secondary | ICD-10-CM | POA: Diagnosis not present

## 2020-04-26 DIAGNOSIS — E1165 Type 2 diabetes mellitus with hyperglycemia: Secondary | ICD-10-CM | POA: Diagnosis not present

## 2020-04-26 DIAGNOSIS — M542 Cervicalgia: Secondary | ICD-10-CM | POA: Diagnosis not present

## 2020-04-26 DIAGNOSIS — R0789 Other chest pain: Secondary | ICD-10-CM | POA: Diagnosis not present

## 2020-04-26 DIAGNOSIS — M5412 Radiculopathy, cervical region: Secondary | ICD-10-CM | POA: Diagnosis not present

## 2020-04-30 DIAGNOSIS — M542 Cervicalgia: Secondary | ICD-10-CM | POA: Diagnosis not present

## 2020-04-30 DIAGNOSIS — R0789 Other chest pain: Secondary | ICD-10-CM | POA: Diagnosis not present

## 2020-04-30 DIAGNOSIS — R079 Chest pain, unspecified: Secondary | ICD-10-CM | POA: Diagnosis not present

## 2020-05-03 DIAGNOSIS — N1831 Chronic kidney disease, stage 3a: Secondary | ICD-10-CM | POA: Diagnosis not present

## 2020-05-03 DIAGNOSIS — E1122 Type 2 diabetes mellitus with diabetic chronic kidney disease: Secondary | ICD-10-CM | POA: Diagnosis not present

## 2020-05-03 DIAGNOSIS — E785 Hyperlipidemia, unspecified: Secondary | ICD-10-CM | POA: Diagnosis not present

## 2020-05-08 DIAGNOSIS — M5412 Radiculopathy, cervical region: Secondary | ICD-10-CM | POA: Diagnosis not present

## 2020-05-08 DIAGNOSIS — E1165 Type 2 diabetes mellitus with hyperglycemia: Secondary | ICD-10-CM | POA: Diagnosis not present

## 2020-05-08 DIAGNOSIS — E1122 Type 2 diabetes mellitus with diabetic chronic kidney disease: Secondary | ICD-10-CM | POA: Diagnosis not present

## 2020-05-08 DIAGNOSIS — E1142 Type 2 diabetes mellitus with diabetic polyneuropathy: Secondary | ICD-10-CM | POA: Diagnosis not present

## 2020-05-08 DIAGNOSIS — R030 Elevated blood-pressure reading, without diagnosis of hypertension: Secondary | ICD-10-CM | POA: Diagnosis not present

## 2020-05-08 DIAGNOSIS — Z299 Encounter for prophylactic measures, unspecified: Secondary | ICD-10-CM | POA: Diagnosis not present

## 2020-05-08 DIAGNOSIS — Z6841 Body Mass Index (BMI) 40.0 and over, adult: Secondary | ICD-10-CM | POA: Diagnosis not present

## 2020-05-10 DIAGNOSIS — H43821 Vitreomacular adhesion, right eye: Secondary | ICD-10-CM | POA: Diagnosis not present

## 2020-05-10 DIAGNOSIS — H31091 Other chorioretinal scars, right eye: Secondary | ICD-10-CM | POA: Diagnosis not present

## 2020-05-10 DIAGNOSIS — E113311 Type 2 diabetes mellitus with moderate nonproliferative diabetic retinopathy with macular edema, right eye: Secondary | ICD-10-CM | POA: Diagnosis not present

## 2020-05-10 DIAGNOSIS — Z970 Presence of artificial eye: Secondary | ICD-10-CM | POA: Diagnosis not present

## 2020-05-17 DIAGNOSIS — M5412 Radiculopathy, cervical region: Secondary | ICD-10-CM | POA: Diagnosis not present

## 2020-05-17 DIAGNOSIS — E119 Type 2 diabetes mellitus without complications: Secondary | ICD-10-CM | POA: Diagnosis not present

## 2020-05-17 DIAGNOSIS — Z299 Encounter for prophylactic measures, unspecified: Secondary | ICD-10-CM | POA: Diagnosis not present

## 2020-05-22 DIAGNOSIS — G540 Brachial plexus disorders: Secondary | ICD-10-CM | POA: Diagnosis not present

## 2020-05-22 DIAGNOSIS — R590 Localized enlarged lymph nodes: Secondary | ICD-10-CM | POA: Diagnosis not present

## 2020-05-29 DIAGNOSIS — E1122 Type 2 diabetes mellitus with diabetic chronic kidney disease: Secondary | ICD-10-CM | POA: Diagnosis not present

## 2020-05-29 DIAGNOSIS — N183 Chronic kidney disease, stage 3 unspecified: Secondary | ICD-10-CM | POA: Diagnosis not present

## 2020-05-29 DIAGNOSIS — E1165 Type 2 diabetes mellitus with hyperglycemia: Secondary | ICD-10-CM | POA: Diagnosis not present

## 2020-05-29 DIAGNOSIS — C801 Malignant (primary) neoplasm, unspecified: Secondary | ICD-10-CM | POA: Diagnosis not present

## 2020-05-29 DIAGNOSIS — Z299 Encounter for prophylactic measures, unspecified: Secondary | ICD-10-CM | POA: Diagnosis not present

## 2020-06-04 ENCOUNTER — Ambulatory Visit (HOSPITAL_COMMUNITY)
Admission: RE | Admit: 2020-06-04 | Discharge: 2020-06-04 | Disposition: A | Discharge status: Home / Self Care | Payer: Medicare Other | Source: Ambulatory Visit | Attending: Hematology | Admitting: Hematology

## 2020-06-04 ENCOUNTER — Encounter (HOSPITAL_COMMUNITY): Payer: Self-pay | Admitting: Hematology

## 2020-06-04 ENCOUNTER — Inpatient Hospital Stay (HOSPITAL_COMMUNITY): Payer: Medicare Other | Attending: Hematology | Admitting: Hematology

## 2020-06-04 ENCOUNTER — Other Ambulatory Visit: Payer: Self-pay

## 2020-06-04 ENCOUNTER — Inpatient Hospital Stay (HOSPITAL_COMMUNITY): Payer: Medicare Other

## 2020-06-04 VITALS — BP 122/59 | HR 80 | Temp 98.2°F | Resp 18 | Wt 358.6 lb

## 2020-06-04 DIAGNOSIS — Z87891 Personal history of nicotine dependence: Secondary | ICD-10-CM | POA: Insufficient documentation

## 2020-06-04 DIAGNOSIS — J449 Chronic obstructive pulmonary disease, unspecified: Secondary | ICD-10-CM | POA: Diagnosis not present

## 2020-06-04 DIAGNOSIS — Z808 Family history of malignant neoplasm of other organs or systems: Secondary | ICD-10-CM | POA: Diagnosis not present

## 2020-06-04 DIAGNOSIS — D381 Neoplasm of uncertain behavior of trachea, bronchus and lung: Secondary | ICD-10-CM | POA: Diagnosis not present

## 2020-06-04 DIAGNOSIS — R634 Abnormal weight loss: Secondary | ICD-10-CM | POA: Insufficient documentation

## 2020-06-04 DIAGNOSIS — E1140 Type 2 diabetes mellitus with diabetic neuropathy, unspecified: Secondary | ICD-10-CM | POA: Insufficient documentation

## 2020-06-04 DIAGNOSIS — R630 Anorexia: Secondary | ICD-10-CM | POA: Insufficient documentation

## 2020-06-04 DIAGNOSIS — M25512 Pain in left shoulder: Secondary | ICD-10-CM | POA: Insufficient documentation

## 2020-06-04 DIAGNOSIS — R918 Other nonspecific abnormal finding of lung field: Secondary | ICD-10-CM | POA: Diagnosis not present

## 2020-06-04 DIAGNOSIS — Z806 Family history of leukemia: Secondary | ICD-10-CM | POA: Diagnosis not present

## 2020-06-04 DIAGNOSIS — M25551 Pain in right hip: Secondary | ICD-10-CM | POA: Diagnosis not present

## 2020-06-04 LAB — CBC WITH DIFFERENTIAL/PLATELET
Abs Immature Granulocytes: 0.04 10*3/uL (ref 0.00–0.07)
Basophils Absolute: 0 10*3/uL (ref 0.0–0.1)
Basophils Relative: 0 %
Eosinophils Absolute: 0.5 10*3/uL (ref 0.0–0.5)
Eosinophils Relative: 4 %
HCT: 37.2 % — ABNORMAL LOW (ref 39.0–52.0)
Hemoglobin: 12 g/dL — ABNORMAL LOW (ref 13.0–17.0)
Immature Granulocytes: 0 %
Lymphocytes Relative: 13 %
Lymphs Abs: 1.7 10*3/uL (ref 0.7–4.0)
MCH: 28.8 pg (ref 26.0–34.0)
MCHC: 32.3 g/dL (ref 30.0–36.0)
MCV: 89.2 fL (ref 80.0–100.0)
Monocytes Absolute: 0.9 10*3/uL (ref 0.1–1.0)
Monocytes Relative: 7 %
Neutro Abs: 9.7 10*3/uL — ABNORMAL HIGH (ref 1.7–7.7)
Neutrophils Relative %: 76 %
Platelets: 281 10*3/uL (ref 150–400)
RBC: 4.17 MIL/uL — ABNORMAL LOW (ref 4.22–5.81)
RDW: 14.7 % (ref 11.5–15.5)
WBC: 13 10*3/uL — ABNORMAL HIGH (ref 4.0–10.5)
nRBC: 0 % (ref 0.0–0.2)

## 2020-06-04 LAB — COMPREHENSIVE METABOLIC PANEL
ALT: 28 U/L (ref 0–44)
AST: 26 U/L (ref 15–41)
Albumin: 3.7 g/dL (ref 3.5–5.0)
Alkaline Phosphatase: 151 U/L — ABNORMAL HIGH (ref 38–126)
Anion gap: 13 (ref 5–15)
BUN: 37 mg/dL — ABNORMAL HIGH (ref 8–23)
CO2: 22 mmol/L (ref 22–32)
Calcium: 9.4 mg/dL (ref 8.9–10.3)
Chloride: 100 mmol/L (ref 98–111)
Creatinine, Ser: 2.07 mg/dL — ABNORMAL HIGH (ref 0.61–1.24)
GFR calc Af Amer: 37 mL/min — ABNORMAL LOW (ref >60)
GFR calc non Af Amer: 32 mL/min — ABNORMAL LOW (ref >60)
Glucose, Bld: 108 mg/dL — ABNORMAL HIGH (ref 70–99)
Potassium: 3.4 mmol/L — ABNORMAL LOW (ref 3.5–5.1)
Sodium: 135 mmol/L (ref 135–145)
Total Bilirubin: 0.6 mg/dL (ref 0.3–1.2)
Total Protein: 7.7 g/dL (ref 6.5–8.1)

## 2020-06-04 LAB — LACTATE DEHYDROGENASE: LDH: 140 U/L (ref 98–192)

## 2020-06-04 IMAGING — DX DG HIP (WITH OR WITHOUT PELVIS) 2-3V*R*
4 series · 4 of 4 positions shown · non-contrast
Comparison: None.

CLINICAL DATA: Right posterior hip pain.  No known injury.

EXAM:
DG HIP (WITH OR WITHOUT PELVIS) 2-3V RIGHT

[pelvis ap (1 of 2)]
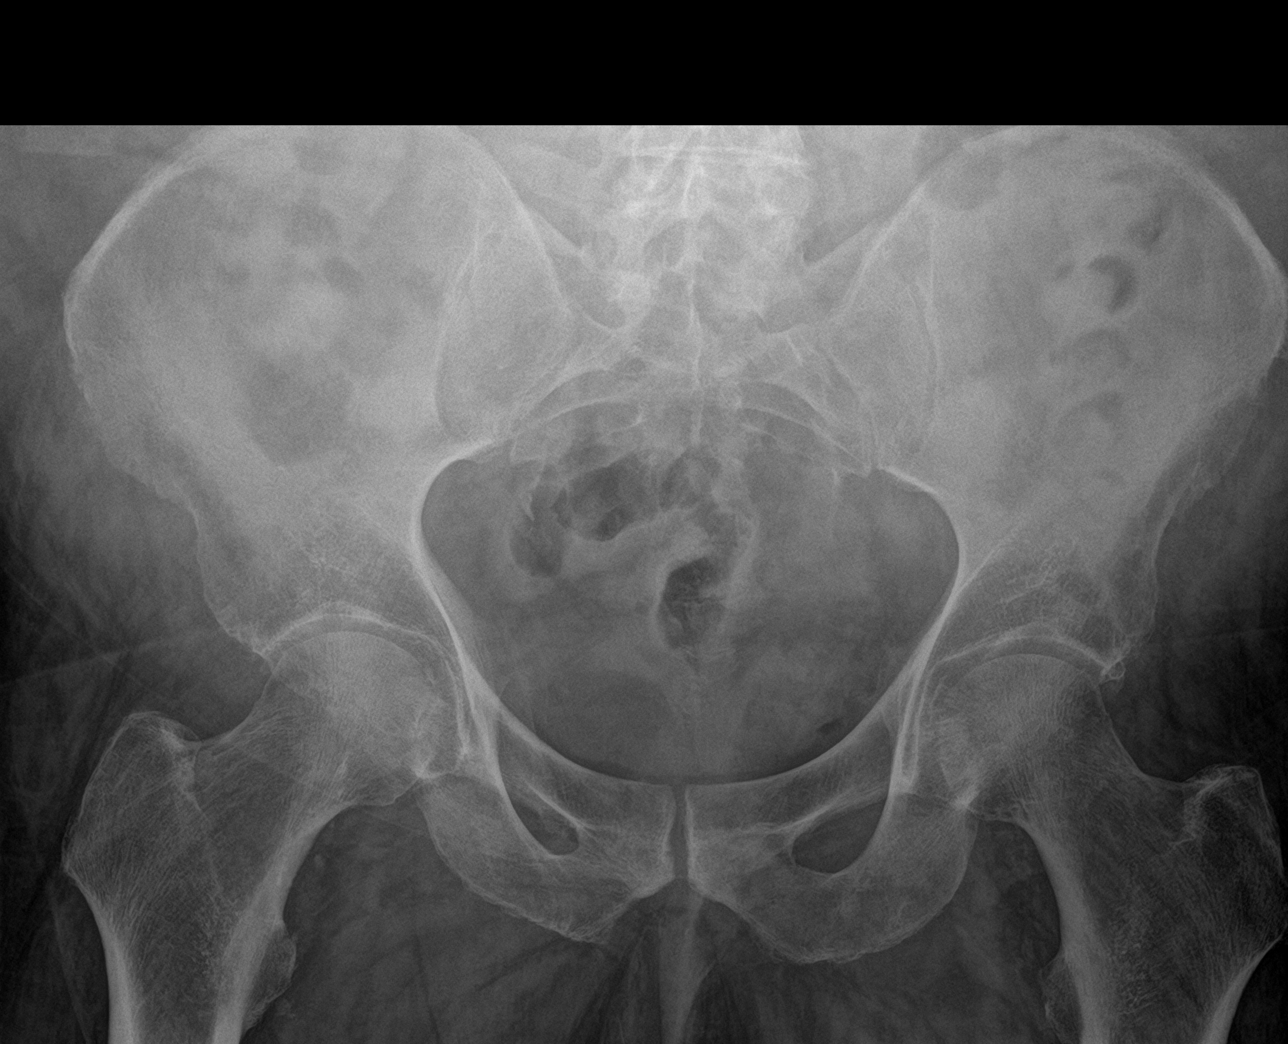

[hip ap]
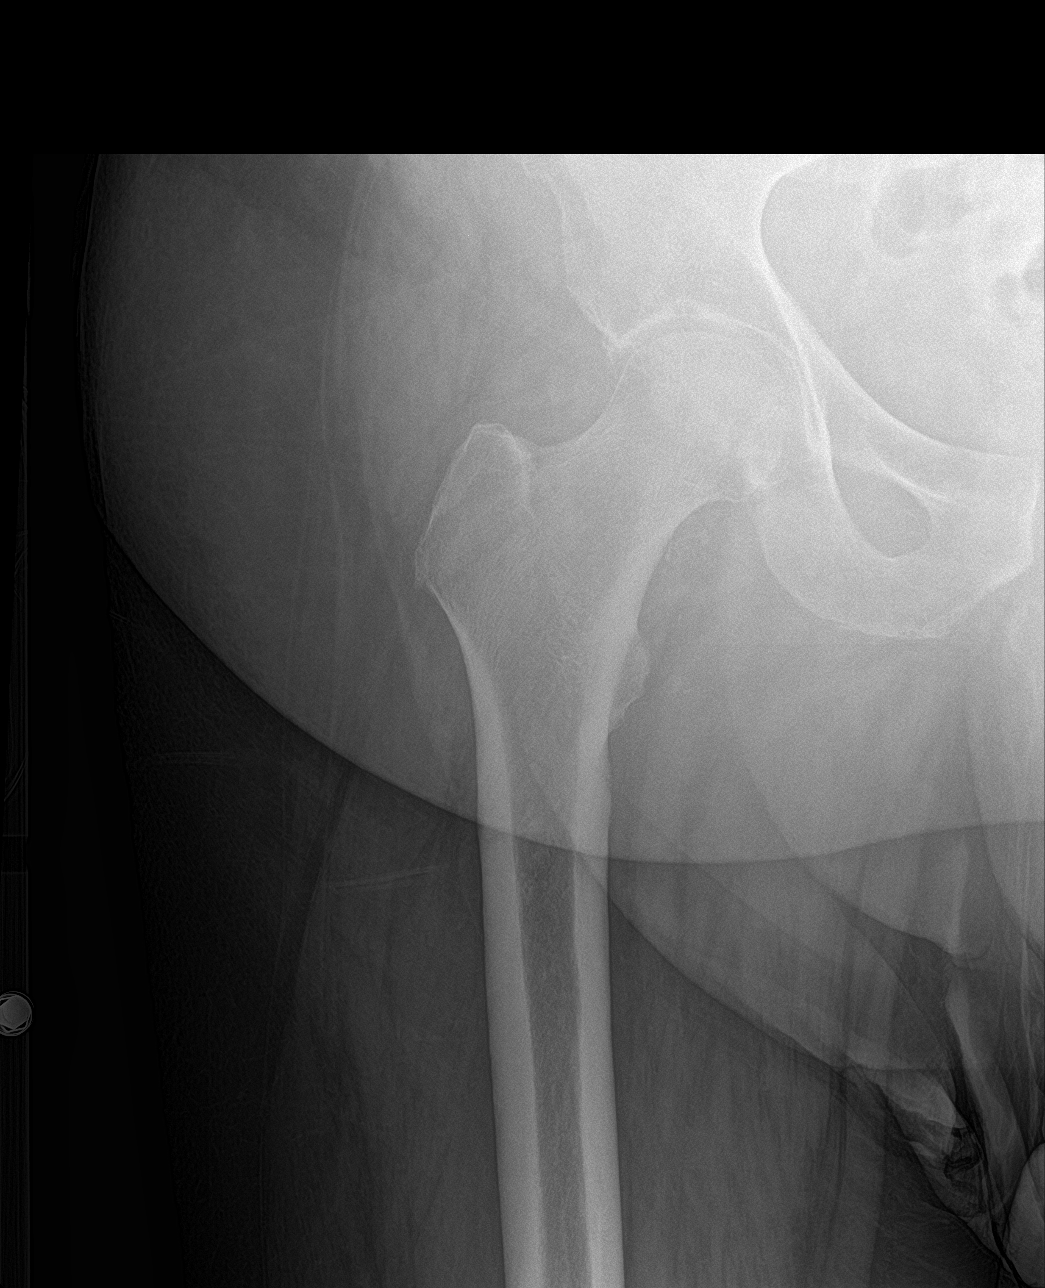

[hip lat]
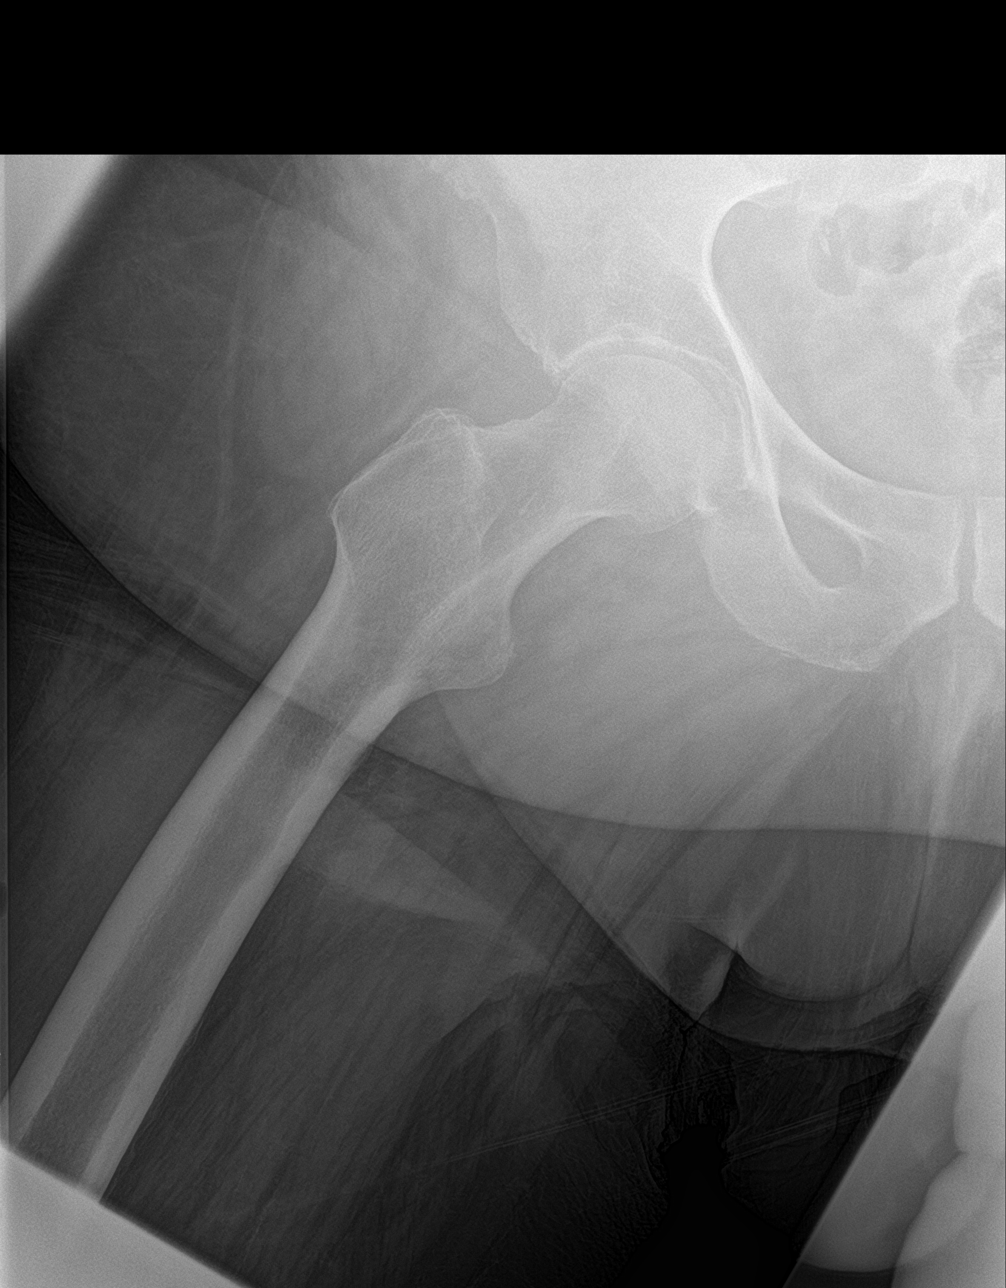

[pelvis ap (2 of 2)]
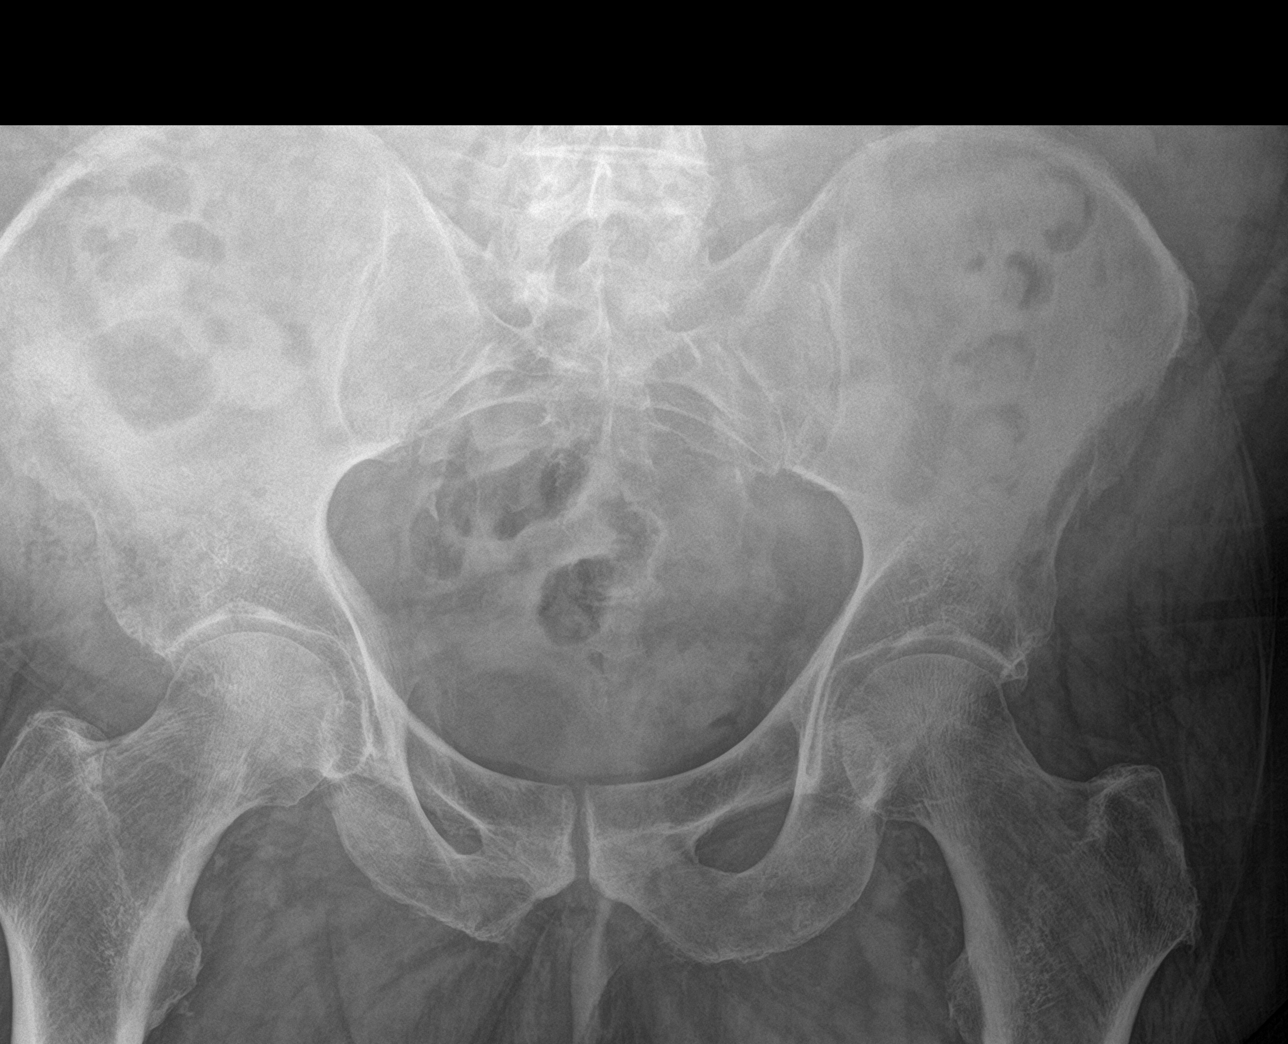

[4 of 4 positions shown; findings below may reference images not displayed]

FINDINGS: Bones of the pelvis appear normal. Sacroiliac joints and symphysis
pubis appear normal. No joint space narrowing of either hip joint.
No sign of avascular necrosis or focal bone lesion.
IMPRESSION: Negative radiographs.

## 2020-06-04 MED ORDER — HYDROMORPHONE HCL 2 MG PO TABS: 2.0000 mg | ORAL_TABLET | ORAL | 0 refills | Status: AC | PRN

## 2020-06-04 NOTE — Patient Instructions (Signed)
[facility] Discharge Instructions  You were seen and examined today by Dr. Delton Coombes. Dr. Delton Coombes is a medical oncologist, meaning he specializes in medical management of cancer. Dr. Delton Coombes discussed your past medical history, family history of cancer and current functional status. You were referred to Dr. Delton Coombes by your primary care provider due to concerning findings on your recent MRI.  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 There is also an abnormality in your lung and spine. This MRI was just of the spine, therefore there is no accurate imaging of your spine.  You will have an X-Ray of your right hip and lab work done today.  Dr. Delton Coombes has ordered an MRI of your brain and a PET scan. This will identify any cancer present in your body. Dr. Delton Coombes has also ordered a biopsy of your lymph node. You will return to the clinic to see Dr. Delton Coombes following those scans and biopsy.   Thank you for choosing [place] at [facility] to provide your oncology and hematology care.  To afford each patient quality time with our provider, please arrive at least 15 minutes before your scheduled appointment time.   If you have a lab appointment with the [facility] please come in thru the Main Entrance and check in at the main information desk.  You need to re-schedule your appointment should you arrive 10 or more minutes late.  We strive to give you quality time with our providers, and arriving late affects you and other patients whose appointments are after yours.  Also, if you no show three or more times for appointments you may be dismissed from the clinic at the providers discretion.     Again, thank you for choosing [facility]  Our hope is that these requests will decrease the  amount of time that you wait before being seen by our physicians.       _____________________________________________________________  Should you have questions after your visit to [facility], please contact our office at [phone] and follow the prompts.  Our office hours are 8:00 a.m. and 4:30 p.m. Monday - Friday.  Please note that voicemails left after 4:00 p.m. may not be returned until the following business day.  We are closed weekends and major holidays.  You do have access to a nurse 24-7, just call the main number to the clinic [phone] and do not press any options, hold on the line and a nurse will answer the phone.    For prescription refill requests, have your pharmacy contact our office and allow 72 hours.    Due to Covid, you will need to wear a mask upon entering the hospital. If you do not have a mask, a mask will be given to you at the Main Entrance upon arrival. For doctor visits, patients may have 1 support person age 55 or older with them. For treatment visits, patients can not have anyone with them due to social distancing guidelines and our immunocompromised population.

## 2020-06-04 NOTE — Progress Notes (Signed)
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 69 y.o. male is here because of evaluation of left upper lobe lung mass, at the request of Dr. Jerene Bears from [facility]. He has a history of upper and lower back pain consisting of dull aches and shooting pains lasting for the past several months, prompting an MRI of the cervical spine which showed an irregular mass in the region of the left brachial plexus.  Today he is accompanied by his wife. He reports that his left shoulder started around January 21 and progressed to pain which shoots down his left shoulder and arm down to the pinkie; he has to hold his arms crossed to alleviate the pain in his arm, but the shoulder soreness persists. He also has episodes at night when the shoulder pain shoots up to a 10/10 and stays there. He also complains of right hip pain which as started several days ago, but does not shoot down his leg. He takes ibuprofen 1200 mg at once, tizanidine daily, and Norco 10/325 once to twice daily, but the pain relief is minimal; he tried taking oxycodone for the pain before, but it did not help at all. He has lost about 20 lbs in the past 6-8 months and his appetite has decreased significantly. He has occasional nausea, but no vomiting. He denies headaches or vision changes. He denies history of MI's or CVA's. His cough is stable due to COPD. 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 He has not had a colonoscopy.  He lives at home and was able to do his ADL's before getting the shoulder pain. 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 He quit smoking in 2006, smoking 1.5 PPD for 38 years. His father had acute  leukemia; sister had some gynecologic cancer; mother had some form of non-melanoma skin cancer.   He will get a PET scan on 9/9 and MRI brain on 9/13.  MEDICAL HISTORY:  Past Medical History:  Diagnosis Date  . Cancer ([facility])    basal cell   . COPD (chronic obstructive pulmonary disease) ([facility])    uses inhalers  . Depression   . Diabetic neuropathy ([employer])   . DM2 (diabetes mellitus, type 2) ([facility])    insulin dependent  . Dyspnea    with exertion  . Hypercholesterolemia   . Low back pain    no meds  . Respiratory infection   . Sleep apnea    uses CPAP    SURGICAL HISTORY: Past Surgical History:  Procedure Laterality Date  . APPLICATION OF A-CELL OF HEAD/NECK Left 11/01/2018   Procedure: APPLICATION OF A-CELL TO SCALP WOUND;  Surgeon: Wallace Going, DO;  Location: [facility];  Service: Plastics;  Laterality: Left;  . CATARACT EXTRACTION W/PHACO Right 04/21/2019   Procedure: CATARACT EXTRACTION PHACO AND INTRAOCULAR LENS PLACEMENT RIGHT EYE;  Surgeon: Baruch Goldmann, MD;  Location: [facility];  Service: Ophthalmology;  Laterality: Right;  right  . CHOLECYSTECTOMY    . DEBRIDEMENT AND CLOSURE WOUND Left 11/01/2018   Procedure: Debridement scalp wound;  Surgeon: Wallace Going, DO;  Location: [facility];  Service: Plastics;  Laterality: Left;  . left eye enucleation following trauma      SOCIAL HISTORY: Social History   Socioeconomic History  . Marital status: Married    Spouse name: Not on file  . Number of children: 2  . Years of education: Not on file  . Highest education level: Not on file  Occupational History  . Occupation: retired Engineer, structural  Tobacco Use  . Smoking status: Former Smoker    Packs/day: 1.50    Years: 20.00    Pack years: 30.00    Types: Cigarettes    Start date: 54    Quit date: 10/05/2004    Years since quitting: 15.6  . Smokeless tobacco: Never Used  Vaping Use  . Vaping Use: Never used  Substance and  Sexual Activity  . Alcohol use: No    Alcohol/week: 0.0 standard drinks  . Drug use: No    Comment: stopped smoking marijuana in 2006  . Sexual activity: Not on file  Other Topics Concern  . Not on file  Social History Narrative  . Not on file   Social Determinants of Health   Financial Resource Strain: Low Risk   . Difficulty of Paying Living Expenses: Not hard at all  Food Insecurity: No Food Insecurity  . Worried About Charity fundraiser in the Last Year: Never true  . Ran Out of Food in the Last Year: Never true  Transportation Needs: No Transportation Needs  . Lack of Transportation (Medical): No  . Lack of Transportation (Non-Medical): No  Physical Activity: Inactive  . Days of Exercise per Week: 0 days  . Minutes of Exercise per Session: 0 min  Stress: No Stress Concern Present  . Feeling of Stress : Not at all  Social Connections: Moderately Isolated  . Frequency of Communication with Friends and Family: More than three times a week  . Frequency of Social Gatherings with Friends and Family: Once a week  . Attends Religious Services: Never  . Active Member of Clubs or Organizations: No  . Attends Archivist Meetings: Never  . Marital Status: Married  Human resources officer Violence: Not At Risk  . Fear of Current or Ex-Partner: No  . Emotionally Abused: No  . Physically Abused: No  . Sexually Abused: No    FAMILY HISTORY: Family History  Problem Relation Age of Onset  . Leukemia Father   . Hypertension Father   . Diabetes Mother   . Ovarian cancer Sister   . Kidney disease Sister   . Hypertension Son   . Hypertension Daughter     ALLERGIES:  is allergic to penicillins.  MEDICATIONS:  Current Outpatient Medications  Medication Sig Dispense Refill  . albuterol (VENTOLIN HFA) 108 (90 BASE) MCG/ACT inhaler Inhale 2 puffs into the lungs every 6 (six) hours as needed for wheezing or shortness of breath.     [name] atorvastatin (LIPITOR) 80 MG tablet Take 80  mg by mouth at bedtime.     . Continuous Blood Gluc Sensor (FREESTYLE LIBRE SENSOR SYSTEM) MISC Use one sensor every 10 days. 3 each 2  . diltiazem (TIAZAC) 240 MG 24 hr capsule Take 1 capsule (240 mg total) by mouth daily. 90 capsule 3  . Fluticasone-Umeclidin-Vilant (TRELEGY ELLIPTA) 100-62.5-25 MCG/INH AEPB Inhale 1 puff into the lungs daily. 60 each 11  . furosemide (LASIX) 40 MG tablet Take 40 mg by mouth daily as needed for fluid.     [name] gabapentin (NEURONTIN) 300 MG capsule Take 300 mg  by mouth 2 (two) times daily.     [name] glipiZIDE (GLUCOTROL) 5 MG tablet TAKE 1 TABLET BY MOUTH TWICE DAILY BEFORE MEAL(S) 60 tablet 3  . HYDROcodone-acetaminophen (NORCO) 10-325 MG tablet Take 1 tablet by mouth 3 (three) times daily as needed.    . insulin regular human CONCENTRATED (HUMULIN R U-500 KWIKPEN) 500 UNIT/ML kwikpen Inject 80 units with breakfast and Lunch, and 60 units with supper if BG is above 90 8 pen 2  . oxymetazoline (AFRIN) 0.05 % nasal spray Place 1-2 sprays into both nostrils 2 (two) times daily as needed for congestion.     . Semaglutide,0.25 or 0.5MG /DOS, 2 MG/1.5ML SOPN Inject 0.5 mg into the skin once a week.    . sertraline (ZOLOFT) 100 MG tablet Take 200 mg by mouth daily.     [name] spironolactone (ALDACTONE) 25 MG tablet Take 1 tablet by mouth once daily 90 tablet 2  . tiZANidine (ZANAFLEX) 4 MG tablet Take 4 mg by mouth 2 (two) times daily.    [name] tobramycin (TOBREX) 0.3 % ophthalmic solution Place 1 drop into the right eye See admin instructions. Instill 1 drop into the right eye 3 times daily, the day before, the day of and the day after eye injection done every 6 weeks.     No current facility-administered medications for this visit.    REVIEW OF SYSTEMS:   Review of Systems  Constitutional: Positive for appetite change (severely decreased) and fatigue (depleted).  HENT:   Negative for trouble swallowing.   Eyes: Negative for eye problems.  Respiratory: Positive for cough  (stable).   Gastrointestinal: Positive for constipation and nausea. Negative for vomiting.  Musculoskeletal: Positive for arthralgias (L shoulder and R hip) and neck pain (7/10 neck and shoulder pain).  Neurological: Positive for numbness (feet). Negative for headaches.  All other systems reviewed and are negative.    PHYSICAL EXAMINATION: ECOG PERFORMANCE STATUS: 1 - Symptomatic but completely ambulatory  Vitals:   06/04/20 1431  BP: (!) 122/59  Pulse: 80  Resp: 18  Temp: 98.2 F (36.8 C)  SpO2: 95%   Filed Weights   06/04/20 1431  Weight: (!) 358 lb 9.6 oz (162.7 kg)   Physical Exam Vitals reviewed.  Constitutional:      Appearance: Normal appearance. He is obese.  HENT:     Head:      Mouth/Throat:     Lips: No lesions.     Mouth: No oral lesions.     Tongue: No lesions.     Palate: No mass.     Pharynx: No pharyngeal swelling.     Tonsils: No tonsillar exudate.  Eyes:     Comments: Left eyelid drooping over artificial left eye  Cardiovascular:     Rate and Rhythm: Normal rate and regular rhythm.     Pulses: Normal pulses.     Heart sounds: Normal heart sounds.  Pulmonary:     Effort: Pulmonary effort is normal.     Breath sounds: Normal breath sounds.  Lymphadenopathy:     Cervical: Cervical adenopathy present.     Left cervical: Superficial cervical adenopathy present.  Neurological:     General: No focal deficit present.     Mental Status: He is alert and oriented to person, place, and time.  Psychiatric:        Mood and Affect: Mood normal.        Behavior: Behavior normal.      LABORATORY DATA:  I have reviewed  the data as listed CBC Latest Ref Rng & Units 05/26/2019 12/13/2018 02/10/2017  WBC 3.8 - 10.8 Thousand/uL 6.5 7.1 6.5  Hemoglobin 13.2 - 17.1 g/dL 12.2(L) 11.5(L) 12.0(L)  Hematocrit 38 - 50 % 36.6(L) 36.8(L) 36.2(L)  Platelets 140 - 400 Thousand/uL 219 229 261.0   CMP Latest Ref Rng & Units 04/09/2020 07/26/2019 07/03/2019  Glucose 65 -  99 mg/dL 176(H) 102(H) 94  BUN 7 - 25 mg/dL 21 19 18   Creatinine 0.70 - 1.25 mg/dL 1.51(H) 1.53(H) 1.49(H)  Sodium 135 - 146 mmol/L 140 141 142  Potassium 3.5 - 5.3 mmol/L 4.1 4.7 4.1  Chloride 98 - 110 mmol/L 102 101 102  CO2 20 - 32 mmol/L 26 25 25   Calcium 8.6 - 10.3 mg/dL 9.2 9.5 9.2  Total Protein 6.1 - 8.1 g/dL 6.5 6.7 -  Total Bilirubin 0.2 - 1.2 mg/dL 0.8 1.2 -  Alkaline Phos 39 - 117 IU/L - 151(H) -  AST 10 - 35 U/L 25 83(H) -  ALT 9 - 46 U/L 23 57(H) -    RADIOGRAPHIC STUDIES: I have personally reviewed the radiological images as listed and agreed with the findings in the report. No results found.  ASSESSMENT:  1.  Left superior sulcus mass: -Presentation with some soreness in the left shoulder area since January 2021, gradually started worsening after 2 months. -Reports 20 pound weight loss in the last 6 months.  He cannot lie on the left side due to pain.  He never had colonoscopy. -Also reported right hip pain and pain across the upper chest for the last few days. -Evaluated by Dr. Woody Seller and the MRI of the cervical spine was done on 05/22/2020 at [facility] in [place].  This showed partially imaged irregular mass in the region of the left brachial plexus, worrisome for a superior sulcus tumor, lymphoma or meta stasis.  There is adjacent left supraclavicular lymphadenopathy with 2 abnormally enlarged lymph nodes.  Larger lymph node which is more inferior measures 5 x 3.4 cm.  Smaller lymph node measures 3.6 x 2.2 cm.  Abnormal signal in the T3 posterior elements bilaterally extending partially into the posterior T3 vertebral body.  Similar abnormal signal in the C7 posterior elements left greater than right to a lesser degree suspicious for bone mets.  2.  Left shoulder pain: -Pain started in the left shoulder and has spread to the whole left upper extremity. -The only comfortable position is crossing the left forearm across the chest and supporting it with the right  hand. -He is taking tizanidine twice daily and hydrocodone 5/325 which is not helping much.  He tried oxycodone which did not help.  3.  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  Quit smoking 15 years ago.  Smoked 1 and half pack per day for 38 years. -Father had acute leukemia.  Sister had gynecological cancer.  Mother had nonmelanoma skin cancer.   PLAN:  1.  Left superior sulcus mass: -We talked about the findings on the MRI of the spine which showed incidental mass in the left brachial plexus area and the left supraclavicular adenopathy. -Left supraclavicular adenopathy is clinically palpable. -I have recommended biopsy of this lymph node by IR. -We will also order PET CT scan and brain MRI. -I will see him back after the biopsy and PET scan.  We will check his labs today along with LDH level.  2.  Left upper extremity pain: -Pain is poorly  controlled on the current regimen. -I will start him on Dilaudid 2 mg every 3 hours with the intent of increasing the dose to 4 mg every 3 hours for adequate pain control.  I have sent a prescription.  I have told him to not take Dilaudid if he feels drowsy.  He will also use stool softeners for constipation.  3.  Diabetic neuropathy: -He has neuropathy in the legs and feet and takes gabapentin 300 mg twice daily.  4.  Right hip pain: -He reported severe right hip pain, limiting walking for the last few days. -I will obtain x-ray of the right hip with pelvis.    All questions were answered. The patient knows to call the clinic with any problems, questions or concerns.   Derek Jack, MD, 06/04/20 2:54 PM  [place] [phone]   I, Milinda Antis, am acting as a scribe for Dr. Sanda Linger.  I, Derek Jack MD, have reviewed the above documentation for accuracy and completeness, and I agree with the above.

## 2020-06-06 ENCOUNTER — Other Ambulatory Visit: Payer: Self-pay | Admitting: "Endocrinology

## 2020-06-13 ENCOUNTER — Ambulatory Visit (HOSPITAL_COMMUNITY)
Admission: RE | Admit: 2020-06-13 | Discharge: 2020-06-13 | Disposition: A | Discharge status: Home / Self Care | Payer: Medicare Other | Source: Ambulatory Visit | Attending: Hematology | Admitting: Hematology

## 2020-06-13 ENCOUNTER — Other Ambulatory Visit: Payer: Self-pay

## 2020-06-13 DIAGNOSIS — K7689 Other specified diseases of liver: Secondary | ICD-10-CM | POA: Diagnosis not present

## 2020-06-13 DIAGNOSIS — D381 Neoplasm of uncertain behavior of trachea, bronchus and lung: Secondary | ICD-10-CM

## 2020-06-13 DIAGNOSIS — S2241XA Multiple fractures of ribs, right side, initial encounter for closed fracture: Secondary | ICD-10-CM | POA: Diagnosis not present

## 2020-06-13 DIAGNOSIS — C7951 Secondary malignant neoplasm of bone: Secondary | ICD-10-CM | POA: Diagnosis not present

## 2020-06-13 DIAGNOSIS — I700 Atherosclerosis of aorta: Secondary | ICD-10-CM | POA: Diagnosis not present

## 2020-06-13 LAB — GLUCOSE, CAPILLARY
Glucose-Capillary: 310 mg/dL — ABNORMAL HIGH (ref 70–99)
Glucose-Capillary: 313 mg/dL — ABNORMAL HIGH (ref 70–99)

## 2020-06-13 IMAGING — PT NM PET TUM IMG INITIAL (PI) SKULL BASE T - THIGH
1 of 7 series · 1 of 25 positions shown · non-contrast
Comparison: None available

CLINICAL DATA: Initial treatment strategy for lung mass.

EXAM:
NUCLEAR MEDICINE PET SKULL BASE TO THIGH
TECHNIQUE: 15.1 mCi F-18 FDG was injected intravenously. Full-ring PET imaging
was performed from the skull base to thigh after the radiotracer. CT
data was obtained and used for attenuation correction and anatomic
localization.
Fasting blood glucose: 313 mg/dl

[Series 4: ct sk_thigh 5.0 hd_fov · axial · 5.0mm · 1.17mm/px · 1 of 259 slices shown]
[im 259/259  brain]
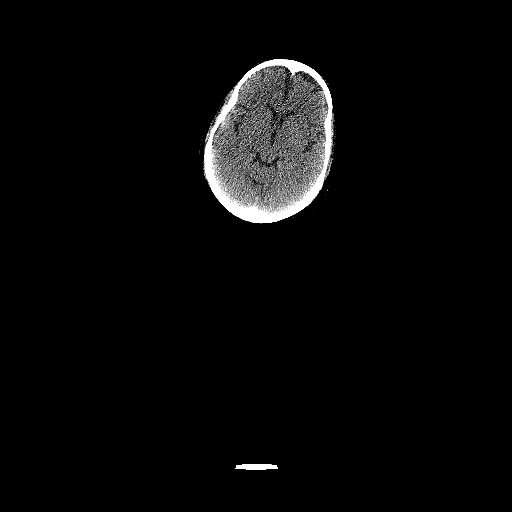

[1 of 25 positions shown; findings below may reference images not displayed]

FINDINGS: Mediastinal blood pool activity: SUV max

Liver activity: SUV max

NECK: Enlarged hypermetabolic LEFT supraclavicular lymph node
measures 25 mm short axis with SUV max equal 10.7.

Incidental CT findings: none

CHEST: Hypermetabolic LEFT apical mass measures approximately 38 mm
x 36 mm with SUV max equal 10.1. The mass involves the apical chest
wall with destruction of the first rib posteriorly (image 40).

Several hypermetabolic small prevascular lymph nodes as well as
hypermetabolic thickening of the pleural surface along the medial
aspect of the LEFT upper mediastinum.

Incidental CT findings: none

ABDOMEN/PELVIS: Ill-defined hypermetabolic lesion in the dome liver
measures approximately 15 mm SUV max equal 9.4.

Hypermetabolic periportal lymph node measures 18 mm SUV max equal
7.5.

Enlarged lymph node position between the aorta and IVC measuring 11
mm inferior to the kidneys with SUV max equal 4.8 (image 141)

Incidental CT findings: Diverticulosis noted. Atherosclerotic
calcification of the aorta.

SKELETON: Widespread hypermetabolic lesions within the pelvis, spine
and multiple ribs. Pathologic fractures involving several of the
posterior RIGHT ribs (SUV max equal 7.2 on image 77 associated RIGHT
rib fracture). Expansile hypermetabolic lesion in the anterior RIGHT
rib with SUV max equal 12.1 (image 77). Lesion in the mid sternum
with SUV max equal 6.5. Lesion the posterior RIGHT iliac bone with
SUV max equal 8.1.

Incidental CT findings: none
IMPRESSION: 1. Hypermetabolic LEFT apical mass extending to the chest wall and
adjacent first rib most consistent with bronchogenic carcinoma
(Pancoast tumor).
2. Hypermetabolic LEFT supraclavicular lymph node is large and would
be amenable to biopsy.
3. Multifocal hypermetabolic skeletal metastasis involving the ribs,
sternum, spine and pelvis. Pathologic fracture involving several of
the RIGHT ribs.
4. Ill-defined hypermetabolic lesion in the dome liver consistent
with hepatic metastasis.
5. Nodal metastasis to the prevascular space, LEFT supraclavicular
node, and abdominal periportal/retroperitoneal nodes.

## 2020-06-13 MED ORDER — FLUDEOXYGLUCOSE F - 18 (FDG) INJECTION
15.0800 | Freq: Once | INTRAVENOUS | Status: AC | PRN
  Administered 2020-06-13: 15.08 via INTRAVENOUS

## 2020-06-14 ENCOUNTER — Encounter (HOSPITAL_COMMUNITY): Payer: Self-pay | Admitting: Radiology

## 2020-06-14 DIAGNOSIS — E1122 Type 2 diabetes mellitus with diabetic chronic kidney disease: Secondary | ICD-10-CM | POA: Diagnosis not present

## 2020-06-14 DIAGNOSIS — N183 Chronic kidney disease, stage 3 unspecified: Secondary | ICD-10-CM | POA: Diagnosis not present

## 2020-06-14 DIAGNOSIS — E1165 Type 2 diabetes mellitus with hyperglycemia: Secondary | ICD-10-CM | POA: Diagnosis not present

## 2020-06-14 DIAGNOSIS — Z299 Encounter for prophylactic measures, unspecified: Secondary | ICD-10-CM | POA: Diagnosis not present

## 2020-06-14 DIAGNOSIS — C801 Malignant (primary) neoplasm, unspecified: Secondary | ICD-10-CM | POA: Diagnosis not present

## 2020-06-14 NOTE — Progress Notes (Signed)
Kenneth Patrick "Ronalee Belts" Male, 69 y.o., 1950/10/14 MRN:  [id number] Phone:  [phone] [name] PCP:  [name], MD Primary Cvg:  Medicare/Medicare Part A And B Next Appt With Radiology ([facility]-MR 1) 06/17/2020 at 8:00 AM  RE: CT Biopsy Received: Today Jacqulynn Cadet, MD  Garth Bigness D Approved for US guided core biopsy of left supraclavicular LN, concern for lung cancer mets.   HKM       Previous Messages   ----- Message -----  From: Garth Bigness D  Sent: 06/14/2020 10:11 AM EDT  To: Ir Procedure Requests  Subject: CT Biopsy                     Procedure:  CT Biopsy   Reason: Neoplasm of uncertain behavior of trachea, bronchus, and lung, L lung mass and lymph node swelling - please biopsy left lymph node   History: NM PET in computer   Provider: Derek Jack   Provider Contact: [phone]

## 2020-06-17 ENCOUNTER — Other Ambulatory Visit: Payer: Self-pay

## 2020-06-17 ENCOUNTER — Ambulatory Visit (HOSPITAL_COMMUNITY)
Admission: RE | Admit: 2020-06-17 | Discharge: 2020-06-17 | Disposition: A | Discharge status: Home / Self Care | Payer: Medicare Other | Source: Ambulatory Visit | Attending: Hematology | Admitting: Hematology

## 2020-06-17 ENCOUNTER — Other Ambulatory Visit (HOSPITAL_COMMUNITY): Payer: Self-pay

## 2020-06-17 DIAGNOSIS — J320 Chronic maxillary sinusitis: Secondary | ICD-10-CM | POA: Diagnosis not present

## 2020-06-17 DIAGNOSIS — D381 Neoplasm of uncertain behavior of trachea, bronchus and lung: Secondary | ICD-10-CM

## 2020-06-17 DIAGNOSIS — I6782 Cerebral ischemia: Secondary | ICD-10-CM | POA: Diagnosis not present

## 2020-06-17 DIAGNOSIS — J322 Chronic ethmoidal sinusitis: Secondary | ICD-10-CM | POA: Diagnosis not present

## 2020-06-17 DIAGNOSIS — J0100 Acute maxillary sinusitis, unspecified: Secondary | ICD-10-CM | POA: Diagnosis not present

## 2020-06-17 IMAGING — MR MR HEAD WO/W CM
13 series · 47 of 48 positions shown · IV contrast (gadavist)
Comparison: None.

CLINICAL DATA: Lung cancer

EXAM:
MRI HEAD WITHOUT AND WITH CONTRAST
TECHNIQUE: Multiplanar, multiecho pulse sequences of the brain and surrounding
structures were obtained without and with intravenous contrast.
CONTRAST:  10mL GADAVIST GADOBUTROL 1 MMOL/ML IV SOLN

[Series 5: DWI · axial · 3.0mm · 1.42mm/px · z∈[-40,+106]mm · 9 of 100 slices shown (1 of 4)]
[im 1/100]
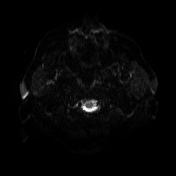
[im 13/100]
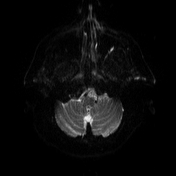
[im 25/100]
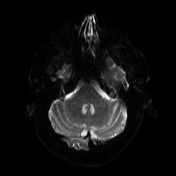
[im 38/100]
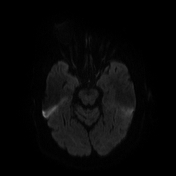
[im 50/100]
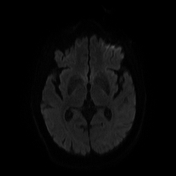
[im 62/100]
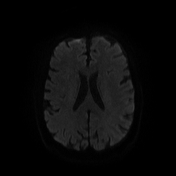
[im 75/100]
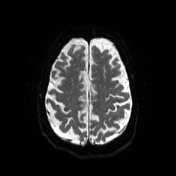
[im 87/100]
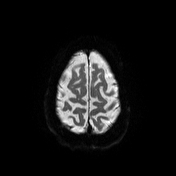
[im 100/100]
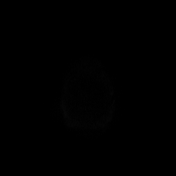

[Series 6: DWI · axial · 3.0mm · 1.42mm/px · z∈[-40,+106]mm · 5 of 50 slices shown (2 of 4)]
[im 1/50]
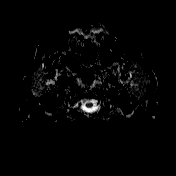
[im 13/50]
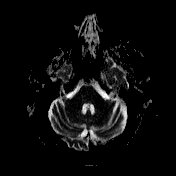
[im 25/50]
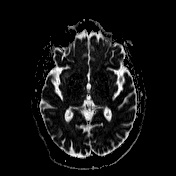
[im 37/50]
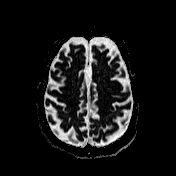
[im 50/50]
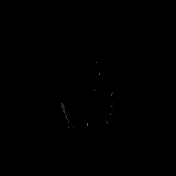

[Series 7: T1 · sagittal · 5.0mm · 0.78mm/px · 2 of 24 slices shown (1 of 2)]
[im 1/24]
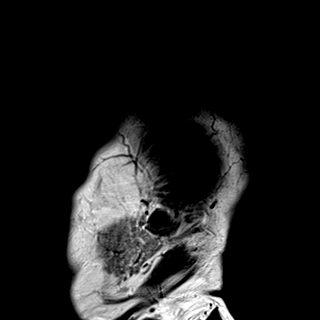
[im 24/24]
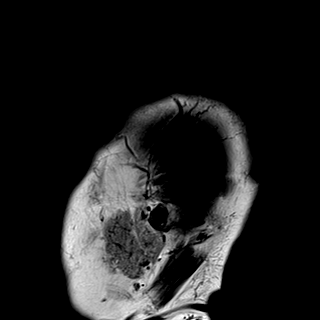

[Series 8: T2 · axial · 5.0mm · 0.65mm/px · z∈[-54,+108]mm · 2 of 26 slices shown]
[im 1/26]
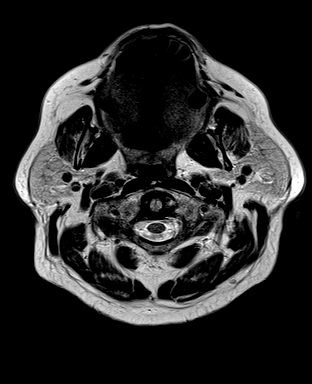
[im 26/26]
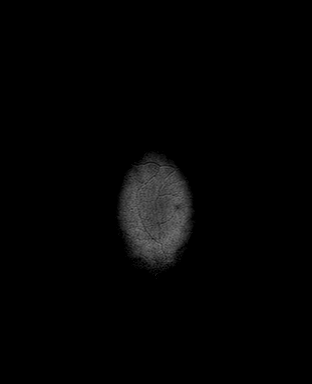

[Series 9: GRE · axial · 3.0mm · 0.49mm/px · z∈[-41,+105]mm · 4 of 50 slices shown]
[im 1/50]
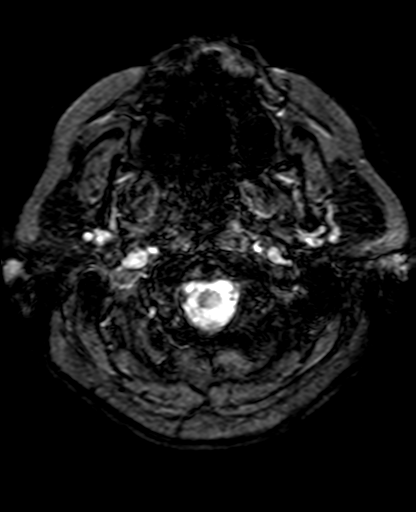
[im 17/50]
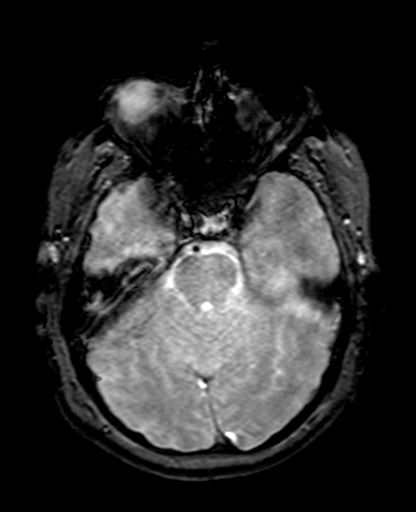
[im 33/50]
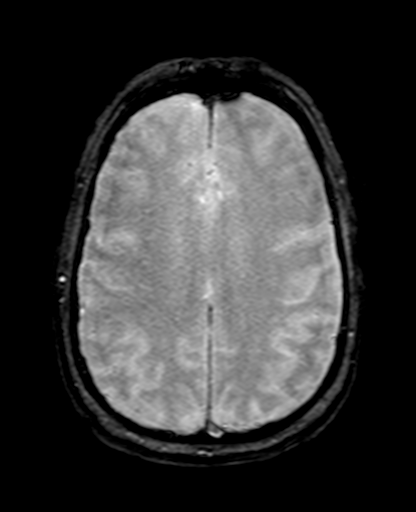
[im 50/50]
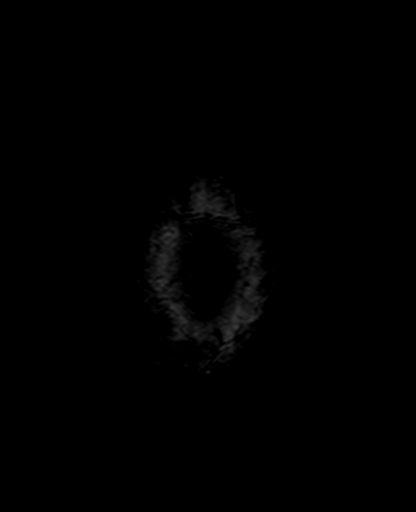

[Series 10: FLAIR · axial · 3.0mm · 0.98mm/px · z∈[-42,+107]mm · 4 of 51 slices shown]
[im 1/51]
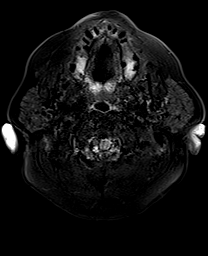
[im 17/51]
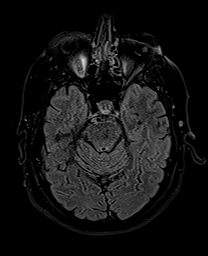
[im 34/51]
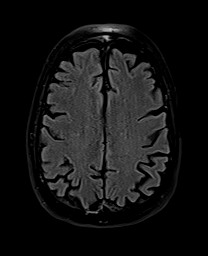
[im 51/51]
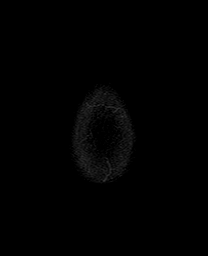

[Series 11: T1 · axial · 3.0mm · 0.49mm/px · z∈[-47,+54]mm · 3 of 52 slices shown (2 of 2)]
[im 1/52]
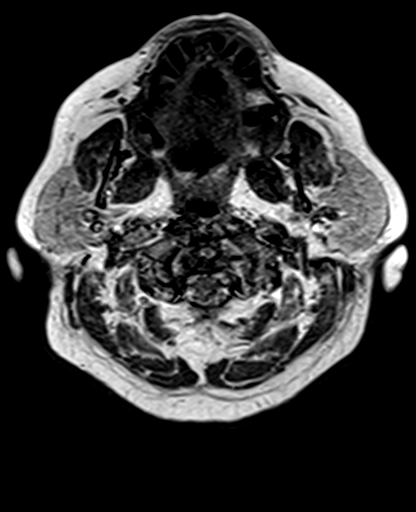
[im 18/52]
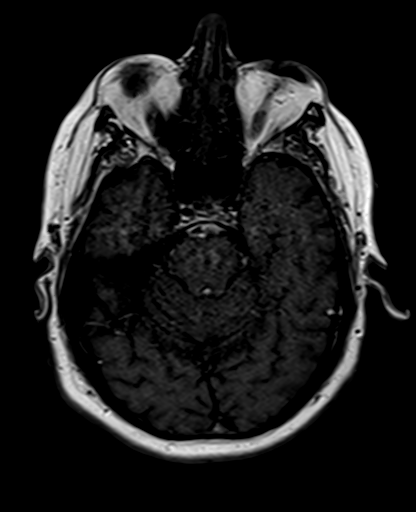
[im 35/52]
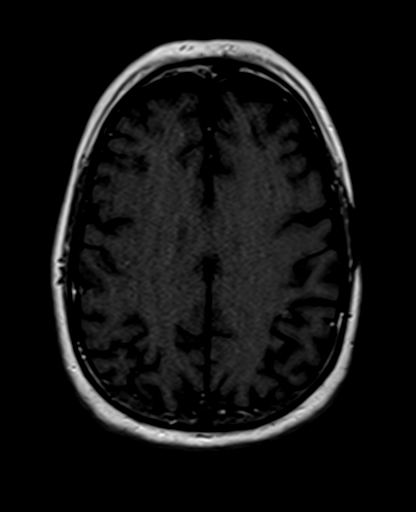

[Series 12: DWI · coronal · 5.0mm · 1.42mm/px · 5 of 64 slices shown (3 of 4)]
[im 1/64]
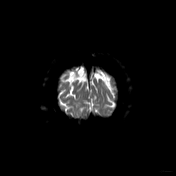
[im 16/64]
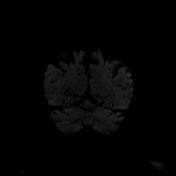
[im 32/64]
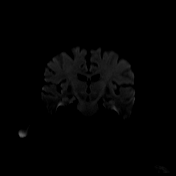
[im 48/64]
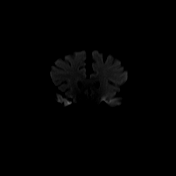
[im 64/64]
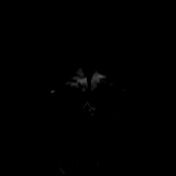

[Series 13: DWI · coronal · 5.0mm · 1.42mm/px · 3 of 32 slices shown (4 of 4)]
[im 1/32]
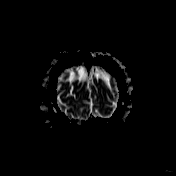
[im 16/32]
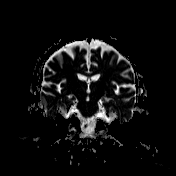
[im 32/32]
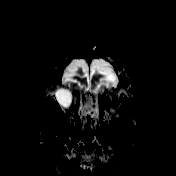

[Series 14: T2 post-contrast · coronal · 5.0mm · 0.62mm/px · 2 of 26 slices shown]
[im 1/26]
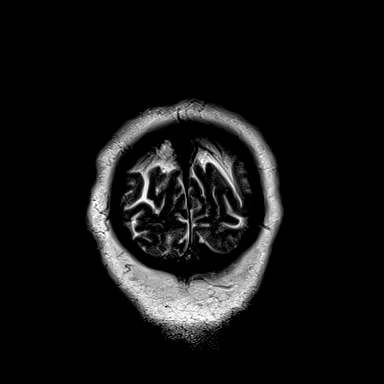
[im 26/26]
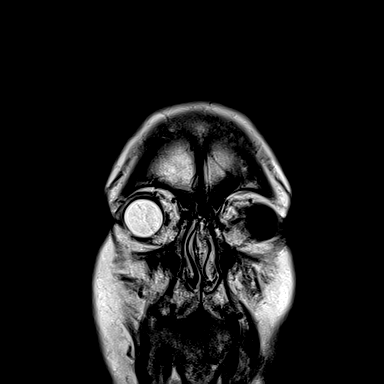

[Series 15: T1 post-contrast · axial · 3.0mm · 0.49mm/px · z∈[-46,+109]mm · 4 of 53 slices shown (1 of 3)]
[im 1/53]
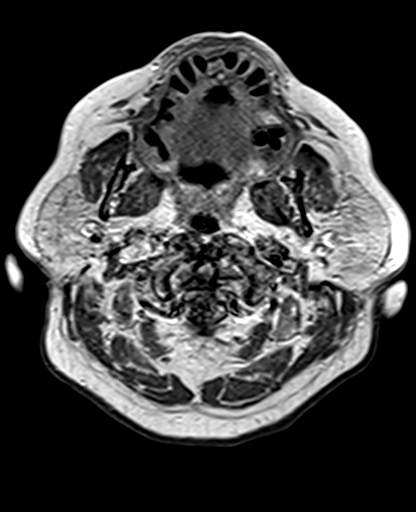
[im 18/53]
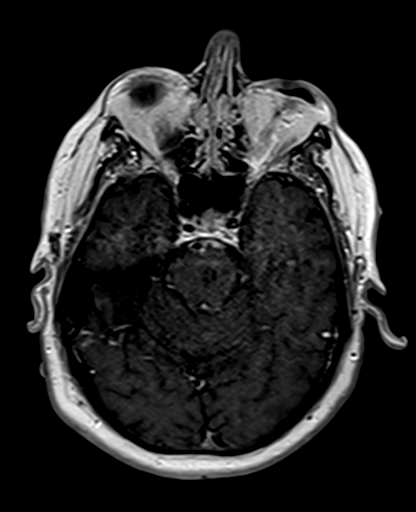
[im 35/53]
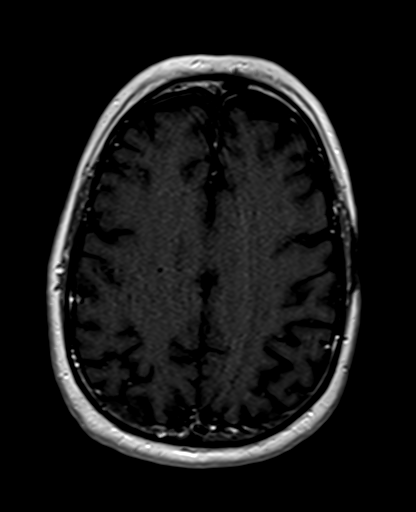
[im 53/53]
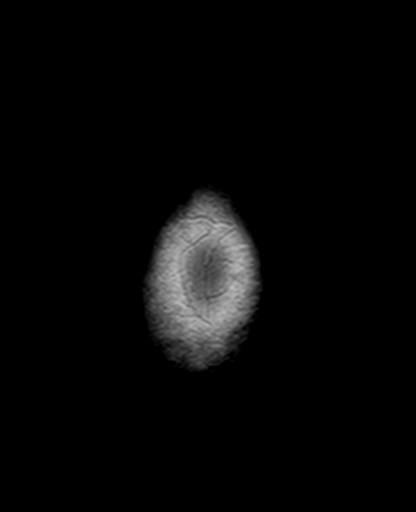

[Series 16: T1 post-contrast · coronal · 5.0mm · 0.47mm/px · 2 of 26 slices shown (2 of 3)]
[im 1/26]
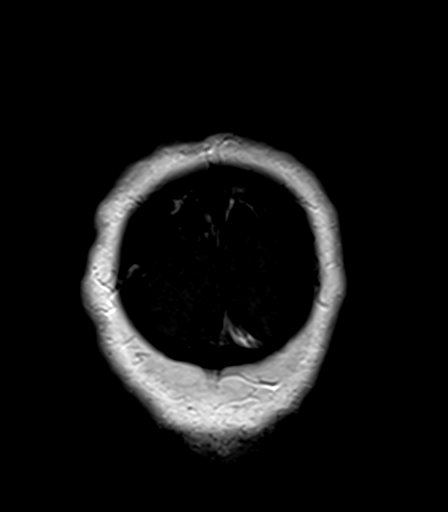
[im 26/26]
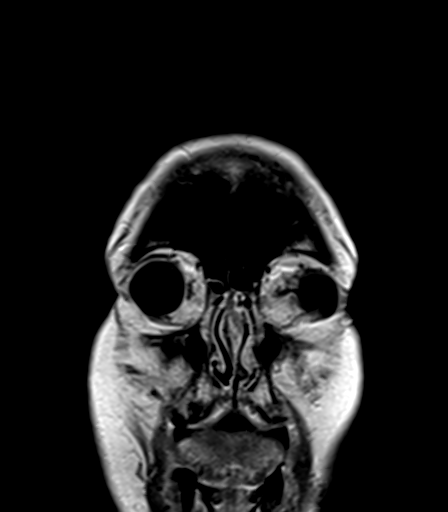

[Series 17: T1 post-contrast · sagittal · 5.0mm · 0.75mm/px · 2 of 24 slices shown (3 of 3)]
[im 1/24]
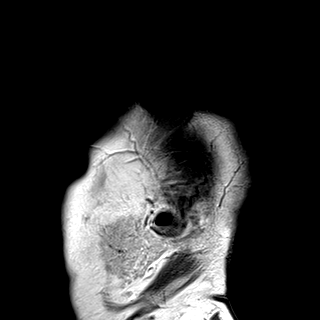
[im 24/24]
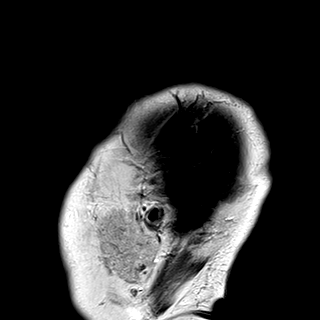

[47 of 48 positions shown; findings below may reference images not displayed]

FINDINGS: Brain: Scattered punctate FLAIR hyperintense foci involving the
periventricular and subcortical white matter are nonspecific however
commonly associated with chronic microvascular ischemic changes. No
diffusion-weighted signal abnormality. No intracranial hemorrhage.

No midline shift, ventriculomegaly or extra-axial fluid collection.
No mass lesion. No abnormal enhancement.

Vascular: Normal flow voids.

Skull and upper cervical spine: Normal marrow signal.

Sinuses/Orbits: Sequela of right lens replacement. Sequela of left
enucleation. Mild ethmoid and left maxillary sinus mucosal
thickening. No mastoid effusion.

Other: Postprocedural appearance of the left frontal scalp.
IMPRESSION: No evidence of intracranial metastases.

Minimal chronic microvascular ischemic changes.

## 2020-06-17 MED ORDER — GADOBUTROL 1 MMOL/ML IV SOLN
10.0000 mL | Freq: Once | INTRAVENOUS | Status: AC | PRN
  Administered 2020-06-17: 10 mL via INTRAVENOUS

## 2020-06-17 MED ORDER — ALPRAZOLAM 1 MG PO TABS: 1.0000 mg | ORAL_TABLET | Freq: Every evening | ORAL | 0 refills | Status: AC | PRN

## 2020-06-18 ENCOUNTER — Ambulatory Visit (HOSPITAL_COMMUNITY): Payer: Medicare Other | Admitting: Hematology

## 2020-06-23 ENCOUNTER — Other Ambulatory Visit: Payer: Self-pay

## 2020-06-23 ENCOUNTER — Emergency Department (HOSPITAL_COMMUNITY): Payer: Medicare Other

## 2020-06-23 ENCOUNTER — Encounter (HOSPITAL_COMMUNITY): Payer: Self-pay | Admitting: *Deleted

## 2020-06-23 ENCOUNTER — Inpatient Hospital Stay (HOSPITAL_COMMUNITY)
Admission: EM | Admit: 2020-06-23 | Discharge: 2020-06-29 | DRG: 987 | Disposition: A | Discharge status: Hospice | Payer: Medicare Other | Attending: Internal Medicine | Admitting: Internal Medicine

## 2020-06-23 DIAGNOSIS — J9601 Acute respiratory failure with hypoxia: Secondary | ICD-10-CM | POA: Diagnosis present

## 2020-06-23 DIAGNOSIS — R0602 Shortness of breath: Secondary | ICD-10-CM | POA: Diagnosis not present

## 2020-06-23 DIAGNOSIS — C787 Secondary malignant neoplasm of liver and intrahepatic bile duct: Secondary | ICD-10-CM | POA: Diagnosis present

## 2020-06-23 DIAGNOSIS — Z91128 Patient's intentional underdosing of medication regimen for other reason: Secondary | ICD-10-CM

## 2020-06-23 DIAGNOSIS — N182 Chronic kidney disease, stage 2 (mild): Secondary | ICD-10-CM | POA: Diagnosis present

## 2020-06-23 DIAGNOSIS — C3490 Malignant neoplasm of unspecified part of unspecified bronchus or lung: Secondary | ICD-10-CM

## 2020-06-23 DIAGNOSIS — J9691 Respiratory failure, unspecified with hypoxia: Secondary | ICD-10-CM | POA: Diagnosis present

## 2020-06-23 DIAGNOSIS — Z833 Family history of diabetes mellitus: Secondary | ICD-10-CM

## 2020-06-23 DIAGNOSIS — Z515 Encounter for palliative care: Secondary | ICD-10-CM | POA: Diagnosis not present

## 2020-06-23 DIAGNOSIS — E1100 Type 2 diabetes mellitus with hyperosmolarity without nonketotic hyperglycemic-hyperosmolar coma (NKHHC): Secondary | ICD-10-CM | POA: Diagnosis present

## 2020-06-23 DIAGNOSIS — M8458XA Pathological fracture in neoplastic disease, other specified site, initial encounter for fracture: Secondary | ICD-10-CM | POA: Diagnosis present

## 2020-06-23 DIAGNOSIS — Z87891 Personal history of nicotine dependence: Secondary | ICD-10-CM | POA: Diagnosis not present

## 2020-06-23 DIAGNOSIS — C801 Malignant (primary) neoplasm, unspecified: Secondary | ICD-10-CM | POA: Diagnosis not present

## 2020-06-23 DIAGNOSIS — C7951 Secondary malignant neoplasm of bone: Secondary | ICD-10-CM | POA: Diagnosis present

## 2020-06-23 DIAGNOSIS — R739 Hyperglycemia, unspecified: Secondary | ICD-10-CM

## 2020-06-23 DIAGNOSIS — C3412 Malignant neoplasm of upper lobe, left bronchus or lung: Secondary | ICD-10-CM | POA: Diagnosis present

## 2020-06-23 DIAGNOSIS — R609 Edema, unspecified: Secondary | ICD-10-CM | POA: Diagnosis present

## 2020-06-23 DIAGNOSIS — K219 Gastro-esophageal reflux disease without esophagitis: Secondary | ICD-10-CM | POA: Diagnosis present

## 2020-06-23 DIAGNOSIS — T383X6A Underdosing of insulin and oral hypoglycemic [antidiabetic] drugs, initial encounter: Secondary | ICD-10-CM | POA: Diagnosis present

## 2020-06-23 DIAGNOSIS — C770 Secondary and unspecified malignant neoplasm of lymph nodes of head, face and neck: Secondary | ICD-10-CM | POA: Diagnosis present

## 2020-06-23 DIAGNOSIS — E1122 Type 2 diabetes mellitus with diabetic chronic kidney disease: Secondary | ICD-10-CM | POA: Diagnosis present

## 2020-06-23 DIAGNOSIS — C7989 Secondary malignant neoplasm of other specified sites: Secondary | ICD-10-CM | POA: Diagnosis present

## 2020-06-23 DIAGNOSIS — R591 Generalized enlarged lymph nodes: Secondary | ICD-10-CM | POA: Diagnosis not present

## 2020-06-23 DIAGNOSIS — G4733 Obstructive sleep apnea (adult) (pediatric): Secondary | ICD-10-CM | POA: Diagnosis present

## 2020-06-23 DIAGNOSIS — C3492 Malignant neoplasm of unspecified part of left bronchus or lung: Secondary | ICD-10-CM | POA: Diagnosis not present

## 2020-06-23 DIAGNOSIS — I878 Other specified disorders of veins: Secondary | ICD-10-CM | POA: Diagnosis present

## 2020-06-23 DIAGNOSIS — R9431 Abnormal electrocardiogram [ECG] [EKG]: Secondary | ICD-10-CM | POA: Diagnosis not present

## 2020-06-23 DIAGNOSIS — E785 Hyperlipidemia, unspecified: Secondary | ICD-10-CM | POA: Diagnosis present

## 2020-06-23 DIAGNOSIS — E119 Type 2 diabetes mellitus without complications: Secondary | ICD-10-CM | POA: Diagnosis not present

## 2020-06-23 DIAGNOSIS — M546 Pain in thoracic spine: Secondary | ICD-10-CM | POA: Diagnosis not present

## 2020-06-23 DIAGNOSIS — E1140 Type 2 diabetes mellitus with diabetic neuropathy, unspecified: Secondary | ICD-10-CM | POA: Diagnosis present

## 2020-06-23 DIAGNOSIS — Z20822 Contact with and (suspected) exposure to covid-19: Secondary | ICD-10-CM | POA: Diagnosis present

## 2020-06-23 DIAGNOSIS — R590 Localized enlarged lymph nodes: Secondary | ICD-10-CM | POA: Diagnosis not present

## 2020-06-23 DIAGNOSIS — I129 Hypertensive chronic kidney disease with stage 1 through stage 4 chronic kidney disease, or unspecified chronic kidney disease: Secondary | ICD-10-CM | POA: Diagnosis present

## 2020-06-23 DIAGNOSIS — J449 Chronic obstructive pulmonary disease, unspecified: Secondary | ICD-10-CM | POA: Diagnosis present

## 2020-06-23 DIAGNOSIS — Z6841 Body Mass Index (BMI) 40.0 and over, adult: Secondary | ICD-10-CM

## 2020-06-23 DIAGNOSIS — R918 Other nonspecific abnormal finding of lung field: Secondary | ICD-10-CM | POA: Diagnosis not present

## 2020-06-23 DIAGNOSIS — F419 Anxiety disorder, unspecified: Secondary | ICD-10-CM | POA: Diagnosis present

## 2020-06-23 DIAGNOSIS — E1121 Type 2 diabetes mellitus with diabetic nephropathy: Secondary | ICD-10-CM | POA: Diagnosis not present

## 2020-06-23 DIAGNOSIS — R279 Unspecified lack of coordination: Secondary | ICD-10-CM | POA: Diagnosis not present

## 2020-06-23 DIAGNOSIS — Z8041 Family history of malignant neoplasm of ovary: Secondary | ICD-10-CM

## 2020-06-23 DIAGNOSIS — E1165 Type 2 diabetes mellitus with hyperglycemia: Secondary | ICD-10-CM | POA: Diagnosis present

## 2020-06-23 DIAGNOSIS — Z9114 Patient's other noncompliance with medication regimen: Secondary | ICD-10-CM

## 2020-06-23 DIAGNOSIS — Z806 Family history of leukemia: Secondary | ICD-10-CM

## 2020-06-23 DIAGNOSIS — R32 Unspecified urinary incontinence: Secondary | ICD-10-CM | POA: Diagnosis present

## 2020-06-23 DIAGNOSIS — R0902 Hypoxemia: Secondary | ICD-10-CM

## 2020-06-23 DIAGNOSIS — Z743 Need for continuous supervision: Secondary | ICD-10-CM | POA: Diagnosis not present

## 2020-06-23 DIAGNOSIS — I10 Essential (primary) hypertension: Secondary | ICD-10-CM | POA: Diagnosis not present

## 2020-06-23 DIAGNOSIS — Z7189 Other specified counseling: Secondary | ICD-10-CM | POA: Diagnosis not present

## 2020-06-23 DIAGNOSIS — R599 Enlarged lymph nodes, unspecified: Secondary | ICD-10-CM | POA: Diagnosis not present

## 2020-06-23 DIAGNOSIS — R05 Cough: Secondary | ICD-10-CM | POA: Diagnosis not present

## 2020-06-23 DIAGNOSIS — F329 Major depressive disorder, single episode, unspecified: Secondary | ICD-10-CM | POA: Diagnosis present

## 2020-06-23 DIAGNOSIS — Z8249 Family history of ischemic heart disease and other diseases of the circulatory system: Secondary | ICD-10-CM

## 2020-06-23 DIAGNOSIS — Z66 Do not resuscitate: Secondary | ICD-10-CM

## 2020-06-23 DIAGNOSIS — M545 Low back pain: Secondary | ICD-10-CM | POA: Diagnosis not present

## 2020-06-23 DIAGNOSIS — Z841 Family history of disorders of kidney and ureter: Secondary | ICD-10-CM

## 2020-06-23 LAB — COMPREHENSIVE METABOLIC PANEL
ALT: 19 U/L (ref 0–44)
AST: 12 U/L — ABNORMAL LOW (ref 15–41)
Albumin: 3.2 g/dL — ABNORMAL LOW (ref 3.5–5.0)
Alkaline Phosphatase: 225 U/L — ABNORMAL HIGH (ref 38–126)
Anion gap: 15 (ref 5–15)
BUN: 35 mg/dL — ABNORMAL HIGH (ref 8–23)
CO2: 25 mmol/L (ref 22–32)
Calcium: 10.3 mg/dL (ref 8.9–10.3)
Chloride: 82 mmol/L — ABNORMAL LOW (ref 98–111)
Creatinine, Ser: 1.66 mg/dL — ABNORMAL HIGH (ref 0.61–1.24)
GFR calc Af Amer: 48 mL/min — ABNORMAL LOW (ref >60)
GFR calc non Af Amer: 42 mL/min — ABNORMAL LOW (ref >60)
Glucose, Bld: 895 mg/dL (ref 70–99)
Potassium: 4.7 mmol/L (ref 3.5–5.1)
Sodium: 122 mmol/L — ABNORMAL LOW (ref 135–145)
Total Bilirubin: 1.2 mg/dL (ref 0.3–1.2)
Total Protein: 7.3 g/dL (ref 6.5–8.1)

## 2020-06-23 LAB — CBC WITH DIFFERENTIAL/PLATELET
Abs Immature Granulocytes: 0.07 10*3/uL (ref 0.00–0.07)
Basophils Absolute: 0 10*3/uL (ref 0.0–0.1)
Basophils Relative: 0 %
Eosinophils Absolute: 0 10*3/uL (ref 0.0–0.5)
Eosinophils Relative: 0 %
HCT: 34.1 % — ABNORMAL LOW (ref 39.0–52.0)
Hemoglobin: 10.7 g/dL — ABNORMAL LOW (ref 13.0–17.0)
Immature Granulocytes: 1 %
Lymphocytes Relative: 5 %
Lymphs Abs: 0.6 10*3/uL — ABNORMAL LOW (ref 0.7–4.0)
MCH: 28.3 pg (ref 26.0–34.0)
MCHC: 31.4 g/dL (ref 30.0–36.0)
MCV: 90.2 fL (ref 80.0–100.0)
Monocytes Absolute: 0.7 10*3/uL (ref 0.1–1.0)
Monocytes Relative: 7 %
Neutro Abs: 9.3 10*3/uL — ABNORMAL HIGH (ref 1.7–7.7)
Neutrophils Relative %: 87 %
Platelets: 348 10*3/uL (ref 150–400)
RBC: 3.78 MIL/uL — ABNORMAL LOW (ref 4.22–5.81)
RDW: 15.2 % (ref 11.5–15.5)
WBC: 10.7 10*3/uL — ABNORMAL HIGH (ref 4.0–10.5)
nRBC: 0 % (ref 0.0–0.2)

## 2020-06-23 LAB — CBG MONITORING, ED
Glucose-Capillary: 547 mg/dL (ref 70–99)
Glucose-Capillary: >600 mg/dL (ref 70–99)
Glucose-Capillary: >600 mg/dL (ref 70–99)
Glucose-Capillary: >600 mg/dL (ref 70–99)
Glucose-Capillary: >600 mg/dL (ref 70–99)
Glucose-Capillary: >600 mg/dL (ref 70–99)
Glucose-Capillary: >600 mg/dL (ref 70–99)

## 2020-06-23 LAB — BLOOD GAS, VENOUS
Acid-Base Excess: 3.8 mmol/L — ABNORMAL HIGH (ref 0.0–2.0)
Bicarbonate: 27.5 mmol/L (ref 20.0–28.0)
FIO2: 28
O2 Saturation: 95.1 %
Patient temperature: 37
pCO2, Ven: 47 mmHg (ref 44.0–60.0)
pH, Ven: 7.399 (ref 7.250–7.430)
pO2, Ven: 85.1 mmHg — ABNORMAL HIGH (ref 32.0–45.0)

## 2020-06-23 LAB — TROPONIN I (HIGH SENSITIVITY)
Troponin I (High Sensitivity): 11 ng/L (ref <18)
Troponin I (High Sensitivity): 10 ng/L (ref <18)

## 2020-06-23 LAB — BRAIN NATRIURETIC PEPTIDE: B Natriuretic Peptide: 39 pg/mL (ref 0.0–100.0)

## 2020-06-23 LAB — LACTIC ACID, PLASMA: Lactic Acid, Venous: 1.6 mmol/L (ref 0.5–1.9)

## 2020-06-23 LAB — SARS CORONAVIRUS 2 BY RT PCR (HOSPITAL ORDER, PERFORMED IN ~~LOC~~ HOSPITAL LAB): SARS Coronavirus 2: NEGATIVE

## 2020-06-23 IMAGING — DX DG CHEST 1V PORT
1 series · 1 of 1 positions shown · non-contrast
Comparison: [DATE] and PET-CT [DATE]

CLINICAL DATA: Hypoxia with shortness of breath and cough 2 weeks.
History of lung cancer.

EXAM:
PORTABLE CHEST 1 VIEW

[chest ap grid]
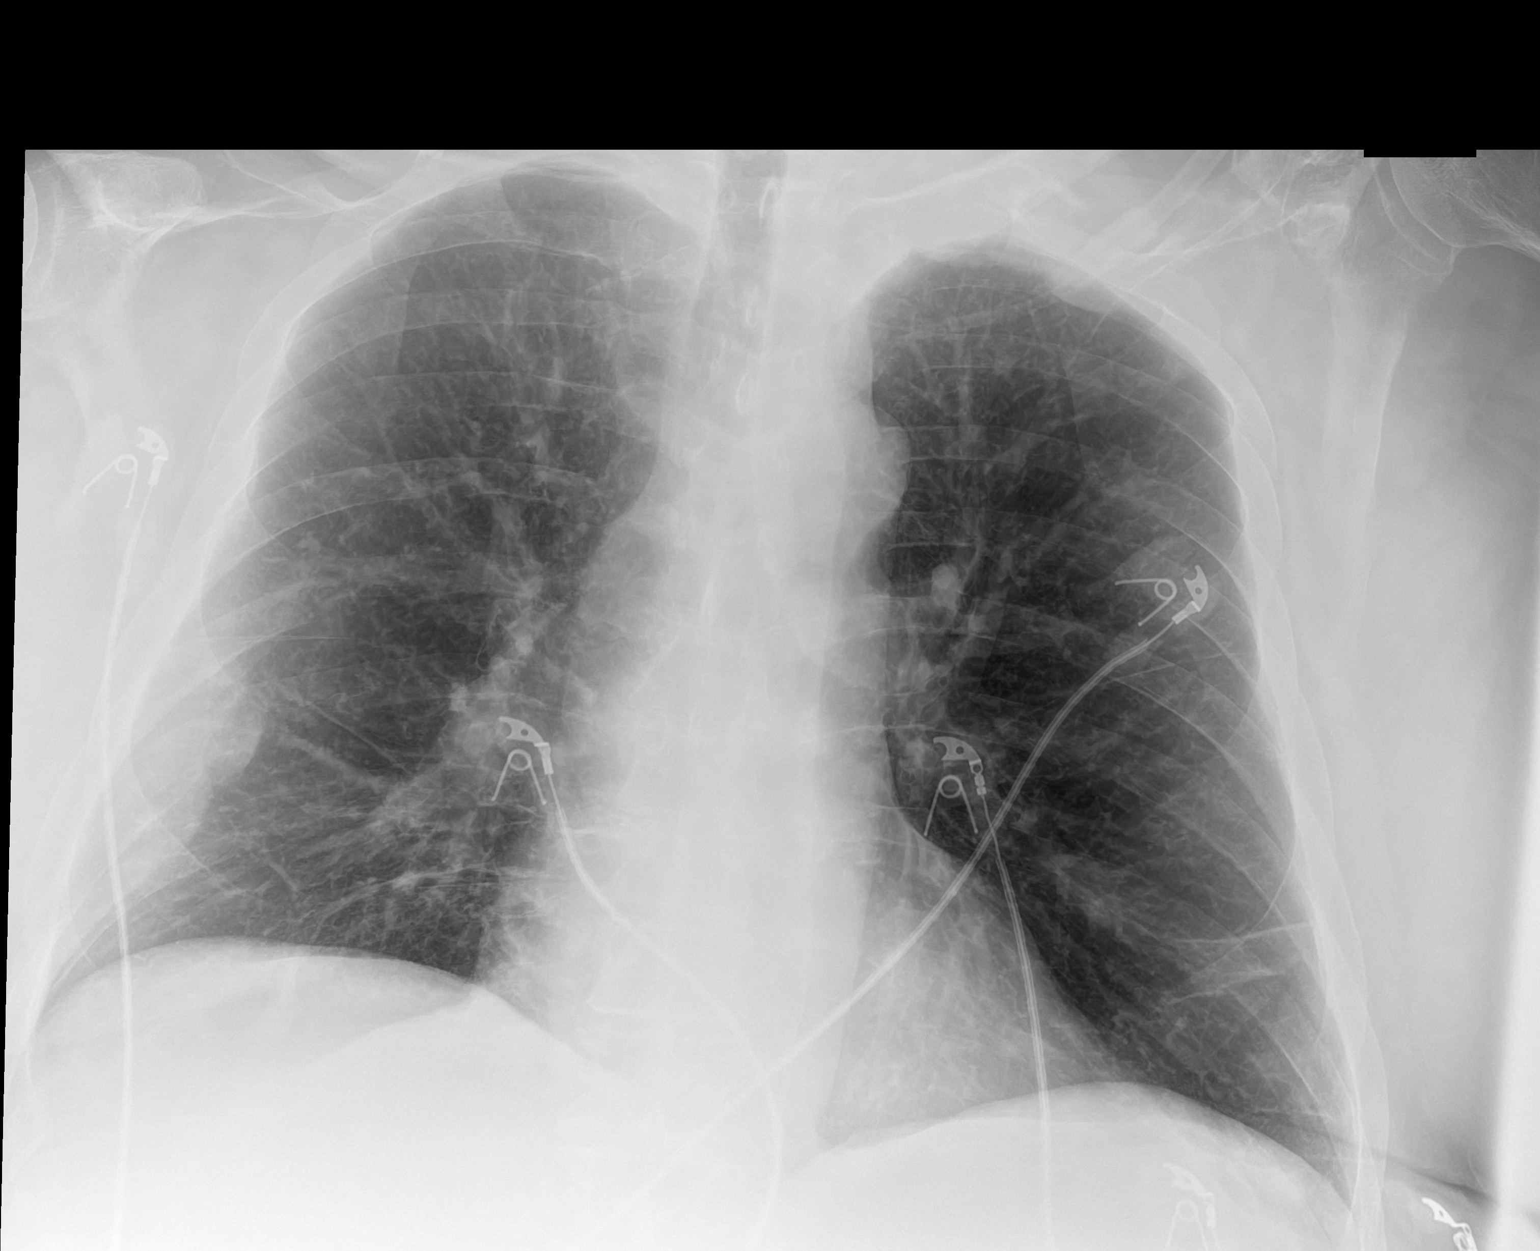

[1 of 1 positions shown; findings below may reference images not displayed]

FINDINGS: Lungs are adequately inflated demonstrate worsening pleural base
mass over the lateral right mid to lower thorax. Right eighth
posterior rib fracture which may be pathologic. Lungs are adequately
inflated without acute airspace process or effusion.
Cardiomediastinal silhouette is normal. Suggestion of lytic process
involving the right posterior seventh rib.
IMPRESSION: 1. No acute airspace process.
2. Worsening pleural based mass over the lateral right mid to lower
thorax. Right eighth posterior rib fracture which may be pathologic.
Possible lytic process involving the right posterior seventh rib.
These findings are likely due to metastatic disease.

## 2020-06-23 IMAGING — CT CT ANGIO CHEST
2 of 6 series · 17 of 46 positions shown · IV contrast (Omnipaque or Isovue)
Comparison: [DATE]

CLINICAL DATA: Shortness of breath and cough x2 weeks.

EXAM:
CT ANGIOGRAPHY CHEST WITH CONTRAST
TECHNIQUE: Multidetector CT imaging of the chest was performed using the
standard protocol during bolus administration of intravenous
contrast. Multiplanar CT image reconstructions and MIPs were
obtained to evaluate the vascular anatomy.
CONTRAST:  80mL OMNIPAQUE IOHEXOL 350 MG/ML SOLN

[Series 5: pe axial thins · axial · 0.90mm/px · z∈[+1128,+1424]mm · 14 of 326 slices shown]
[im 15/326  lung]
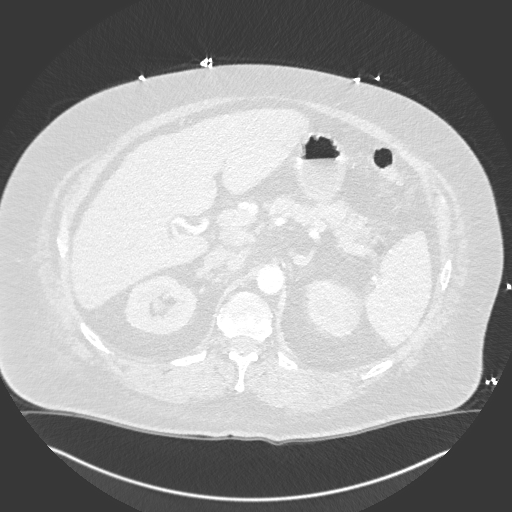
[im 43/326  soft-tissue]
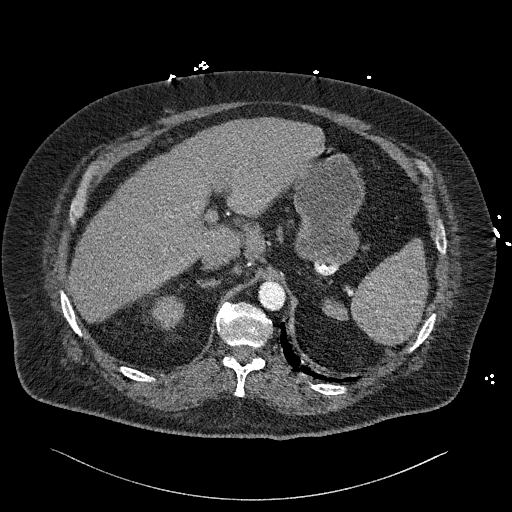
[im 57/326  lung]
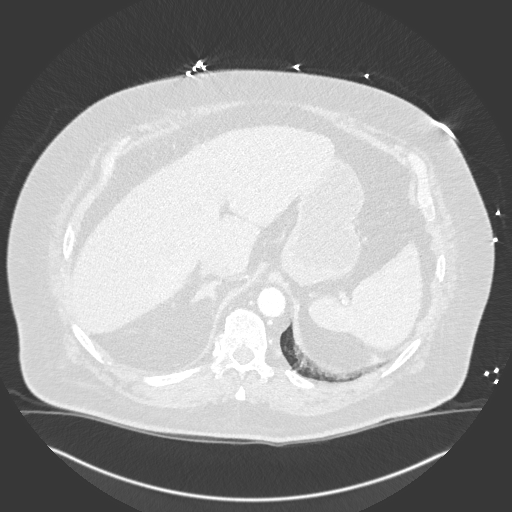
[im 85/326  soft-tissue]
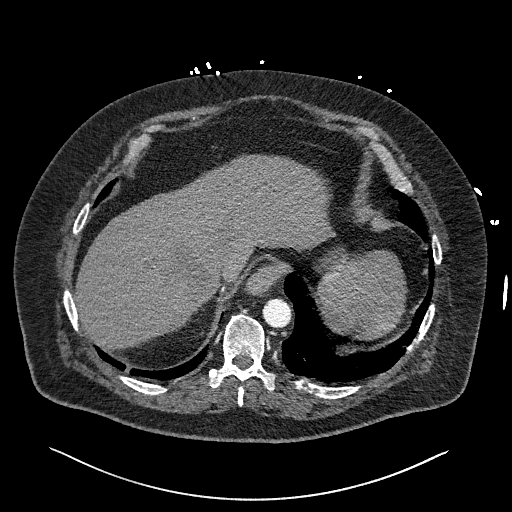
[im 114/326  lung]
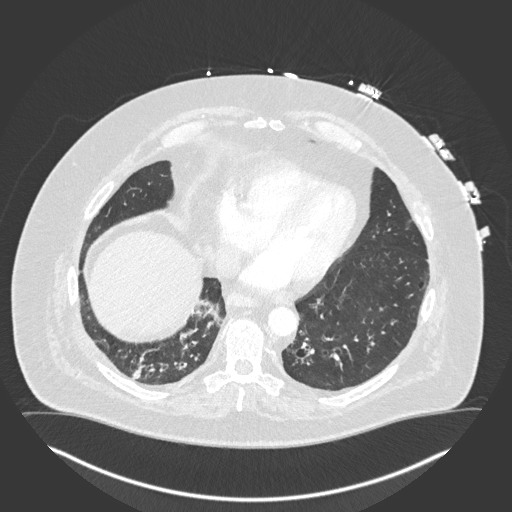
[im 128/326  soft-tissue]
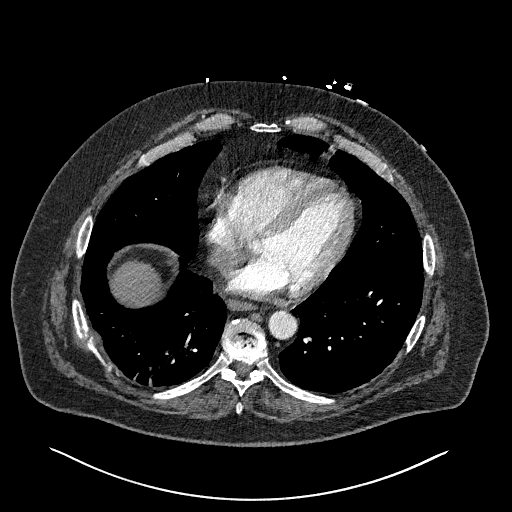
[im 156/326  lung]
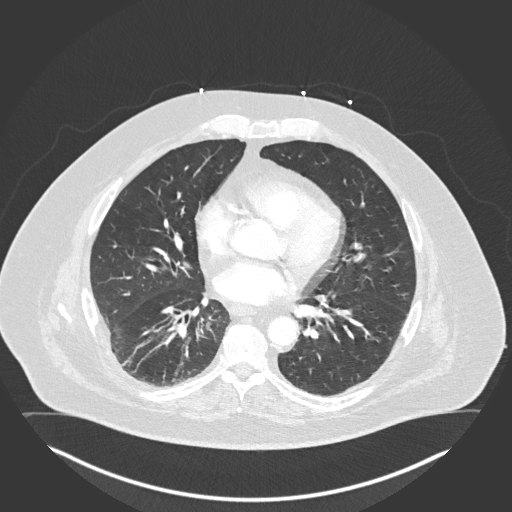
[im 170/326  soft-tissue]
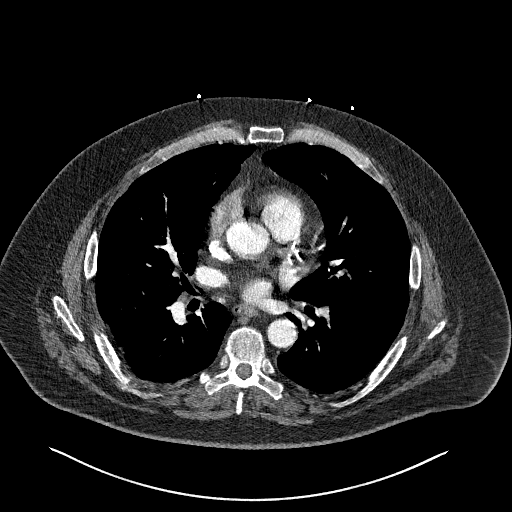
[im 198/326  lung]
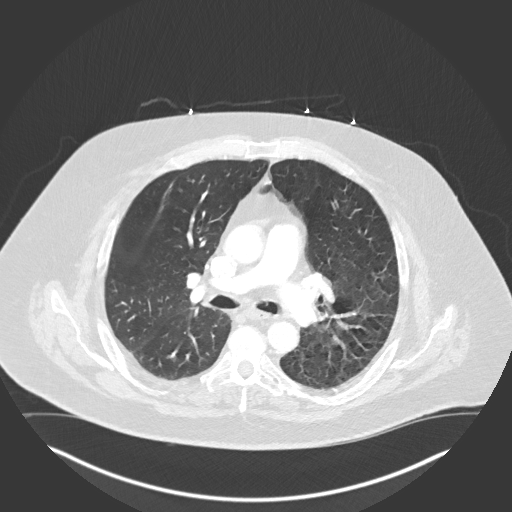
[im 212/326  soft-tissue]
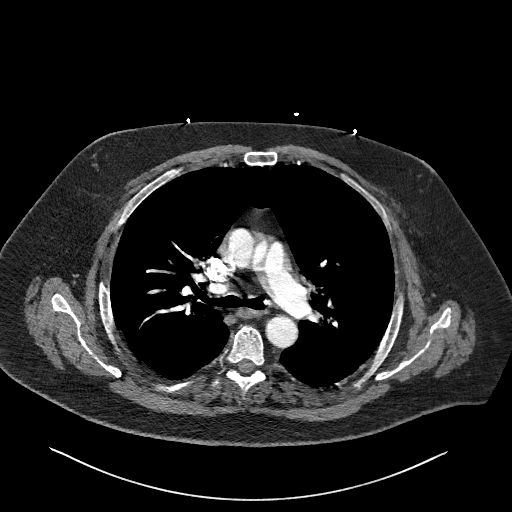
[im 241/326  lung]
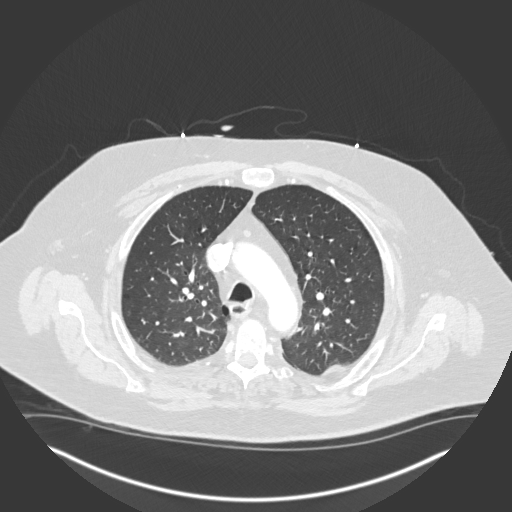
[im 269/326  soft-tissue]
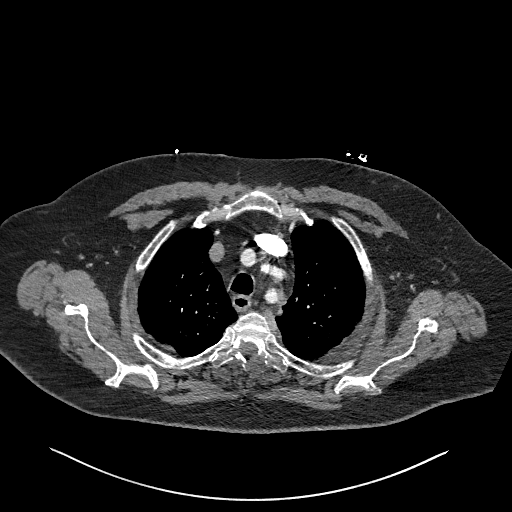
[im 283/326  lung]
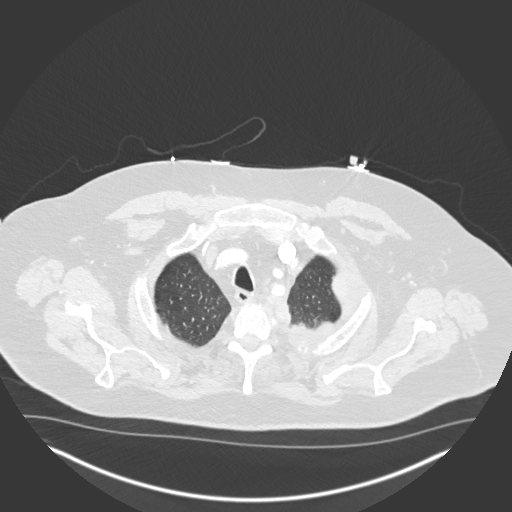
[im 311/326  soft-tissue]
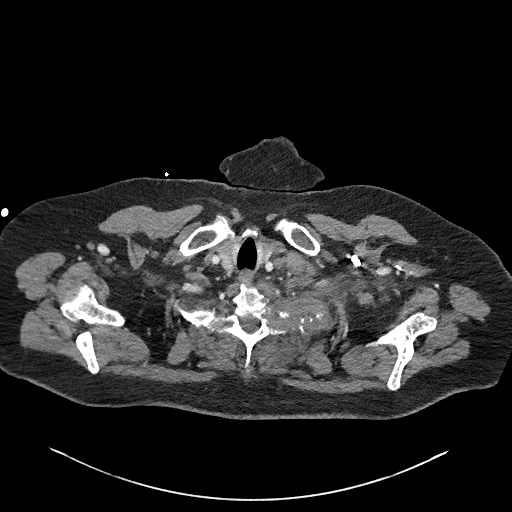

[Series 7: cor soft · coronal · 0.67mm/px · 3 of 180 slices shown]
[im 45/180  soft-tissue]
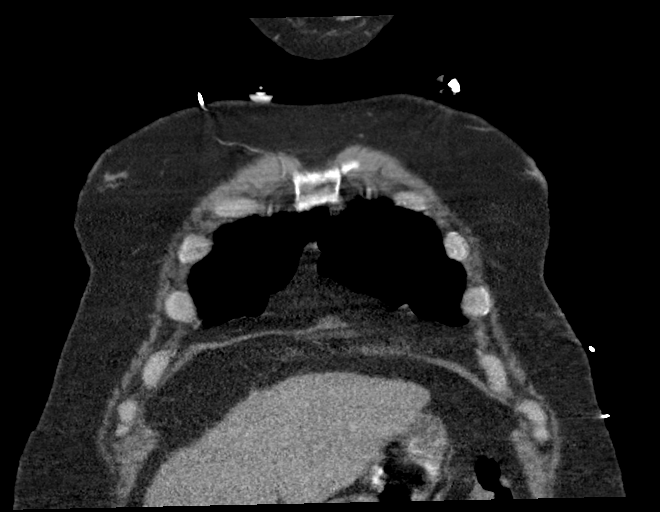
[im 90/180  soft-tissue]
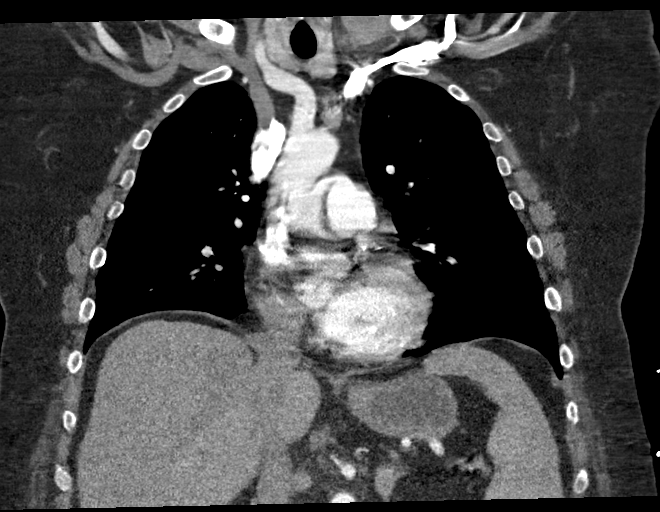
[im 135/180  soft-tissue]
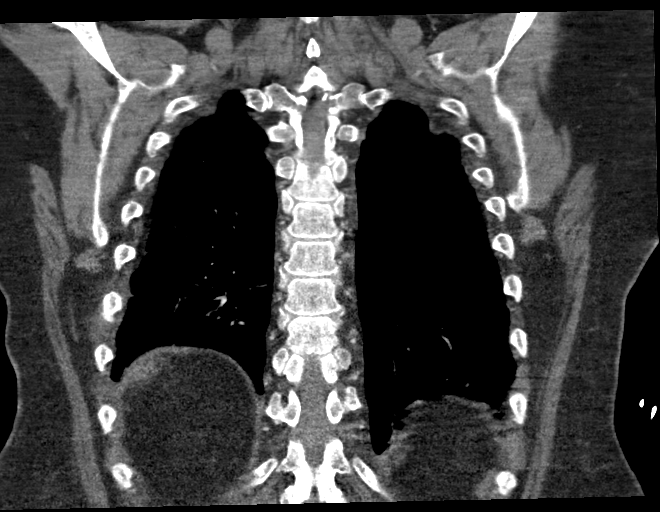

[17 of 46 positions shown; findings below may reference images not displayed]

FINDINGS: Cardiovascular: Satisfactory opacification of the pulmonary arteries
to the segmental level. No evidence of pulmonary embolism. Mild
artifact is seen overlying the pulmonary arteries within the
perihilar region on the left. Normal heart size. No pericardial
effusion.

Mediastinum/Nodes: No enlarged mediastinal, hilar, or axillary lymph
nodes. Thyroid gland, trachea, and esophagus demonstrate no
significant findings.

Lungs/Pleura: Mild atelectasis and/or early infiltrate is seen
within the left lung base.

A 4.9 cm x 3.2 cm heterogeneous low-attenuation mass is seen within
the left apex. Associated areas of cortical destruction are seen
involving the first and second left ribs, as well as the adjacent
portions of the C7 and T1 vertebral bodies.

A 2.6 cm x 1.4 cm soft tissue mass is seen along the anterolateral
aspect of the right chest wall. This originates from the fifth right
rib.

There is no evidence of a pleural effusion or pneumothorax.

Upper Abdomen: A 4.9 cm x 2.9 cm area of heterogeneous low
attenuation is seen along the medial aspect of the liver dome.

Surgical clips are noted within the gallbladder fossa.

1.5 cm 1.9 cm lymph nodes are seen along the posteromedial aspect of
the inferior vena cava.

Musculoskeletal: A 4.3 cm x 2.4 cm soft tissue mass is seen adjacent
to the posterior aspect of the proximal left clavicle (axial CT
images 1 through 17, CT series number 5).

A 1.3 cm x 1.0 cm mildly expansile lytic lesion is seen within the
posterior aspect of the fifth left rib.

Similar appearing lesions are seen within the posteromedial aspect
of the tenth left rib, anterolateral aspect of the seventh left rib,
posterior seventh right rib, posterolateral aspect of the eighth
right rib and lateral fourth right rib.

A nondisplaced 12 left rib fracture is present. A chronic posterior
eighth right rib fracture is seen.

Multilevel degenerative changes seen throughout the thoracic spine.

Multiple ill-defined lytic lesions are noted throughout multiple
thoracic spine vertebral bodies.

Review of the MIP images confirms the above findings.
IMPRESSION: 1. No evidence of pulmonary embolism.
2. Heterogeneous left apical soft tissue mass with associated areas
of cortical destruction involving the first and second left ribs, as
well as the adjacent portions of the C7 and T1 vertebral bodies.
This is consistent with primary lung malignancy.
3. 2.6 cm x 1.4 cm soft tissue mass along the anterolateral aspect
of the right chest wall, which originates from the fifth right rib.
This is concerning for metastatic disease.
4. Multiple ill-defined lytic lesions throughout multiple thoracic
spine vertebral bodies and multiple ribs, consistent with osseous
metastasis.
5. 4.9 cm x 2.9 cm area of heterogeneous low attenuation along the
medial aspect of the liver dome. This is suspicious for a metastatic
lesion.
6. Mild atelectasis and/or early infiltrate within the left lung
base.
7. Evidence of prior cholecystectomy.

## 2020-06-23 MED ORDER — ALBUTEROL SULFATE HFA 108 (90 BASE) MCG/ACT IN AERS
2.0000 | INHALATION_SPRAY | RESPIRATORY_TRACT | Status: DC | PRN
  Filled 2020-06-23: qty 6.7

## 2020-06-23 MED ORDER — UMECLIDINIUM BROMIDE 62.5 MCG/INH IN AEPB
1.0000 | INHALATION_SPRAY | Freq: Every day | RESPIRATORY_TRACT | Status: DC
  Administered 2020-06-24 – 2020-06-28 (×5): 1 via RESPIRATORY_TRACT
  Filled 2020-06-23: qty 7

## 2020-06-23 MED ORDER — DILTIAZEM HCL ER COATED BEADS 240 MG PO CP24
240.0000 mg | ORAL_CAPSULE | Freq: Every day | ORAL | Status: DC
  Administered 2020-06-24 – 2020-06-28 (×5): 240 mg via ORAL
  Filled 2020-06-23 (×8): qty 1

## 2020-06-23 MED ORDER — ONDANSETRON HCL 4 MG/2ML IJ SOLN: 4.0000 mg | Freq: Four times a day (QID) | INTRAMUSCULAR | Status: DC | PRN

## 2020-06-23 MED ORDER — ACETAMINOPHEN 650 MG RE SUPP: 650.0000 mg | Freq: Four times a day (QID) | RECTAL | Status: DC | PRN

## 2020-06-23 MED ORDER — ALPRAZOLAM 1 MG PO TABS
1.0000 mg | ORAL_TABLET | Freq: Every evening | ORAL | Status: DC | PRN
  Administered 2020-06-23: 1 mg via ORAL
  Filled 2020-06-23: qty 2

## 2020-06-23 MED ORDER — IOHEXOL 350 MG/ML SOLN
80.0000 mL | Freq: Once | INTRAVENOUS | Status: AC | PRN
  Administered 2020-06-23: 80 mL via INTRAVENOUS

## 2020-06-23 MED ORDER — ASPIRIN 81 MG PO CHEW
324.0000 mg | CHEWABLE_TABLET | Freq: Once | ORAL | Status: AC
  Administered 2020-06-23: 324 mg via ORAL
  Filled 2020-06-23: qty 4

## 2020-06-23 MED ORDER — IPRATROPIUM BROMIDE HFA 17 MCG/ACT IN AERS
2.0000 | INHALATION_SPRAY | Freq: Once | RESPIRATORY_TRACT | Status: DC
  Filled 2020-06-23: qty 12.9

## 2020-06-23 MED ORDER — MOMETASONE FURO-FORMOTEROL FUM 100-5 MCG/ACT IN AERO
2.0000 | INHALATION_SPRAY | Freq: Two times a day (BID) | RESPIRATORY_TRACT | Status: DC
  Administered 2020-06-24 – 2020-06-28 (×9): 2 via RESPIRATORY_TRACT
  Filled 2020-06-23: qty 8.8

## 2020-06-23 MED ORDER — ATORVASTATIN CALCIUM 40 MG PO TABS
80.0000 mg | ORAL_TABLET | Freq: Every day | ORAL | Status: DC
  Administered 2020-06-23 – 2020-06-29 (×6): 80 mg via ORAL
  Filled 2020-06-23 (×6): qty 2

## 2020-06-23 MED ORDER — HEPARIN SODIUM (PORCINE) 5000 UNIT/ML IJ SOLN
5000.0000 [IU] | Freq: Three times a day (TID) | INTRAMUSCULAR | Status: DC
  Administered 2020-06-23 – 2020-06-29 (×16): 5000 [IU] via SUBCUTANEOUS
  Filled 2020-06-23 (×16): qty 1

## 2020-06-23 MED ORDER — SERTRALINE HCL 100 MG PO TABS
200.0000 mg | ORAL_TABLET | Freq: Every day | ORAL | Status: DC
  Administered 2020-06-24 – 2020-06-28 (×5): 200 mg via ORAL
  Filled 2020-06-23 (×2): qty 2
  Filled 2020-06-23: qty 4
  Filled 2020-06-23 (×2): qty 2

## 2020-06-23 MED ORDER — IPRATROPIUM-ALBUTEROL 0.5-2.5 (3) MG/3ML IN SOLN: 3.0000 mL | Freq: Four times a day (QID) | RESPIRATORY_TRACT | Status: DC | PRN

## 2020-06-23 MED ORDER — DEXTROSE 50 % IV SOLN: 0.0000 mL | INTRAVENOUS | Status: DC | PRN

## 2020-06-23 MED ORDER — HYDROMORPHONE HCL 2 MG PO TABS: 2.0000 mg | ORAL_TABLET | ORAL | Status: DC | PRN

## 2020-06-23 MED ORDER — GABAPENTIN 300 MG PO CAPS
300.0000 mg | ORAL_CAPSULE | Freq: Two times a day (BID) | ORAL | Status: DC
  Administered 2020-06-23 – 2020-06-27 (×8): 300 mg via ORAL
  Filled 2020-06-23 (×8): qty 1

## 2020-06-23 MED ORDER — ACETAMINOPHEN 325 MG PO TABS: 650.0000 mg | ORAL_TABLET | Freq: Four times a day (QID) | ORAL | Status: DC | PRN

## 2020-06-23 MED ORDER — DEXTROSE IN LACTATED RINGERS 5 % IV SOLN: INTRAVENOUS | Status: DC

## 2020-06-23 MED ORDER — FAMOTIDINE IN NACL 20-0.9 MG/50ML-% IV SOLN
20.0000 mg | Freq: Once | INTRAVENOUS | Status: AC
  Administered 2020-06-23: 20 mg via INTRAVENOUS
  Filled 2020-06-23: qty 50

## 2020-06-23 MED ORDER — INSULIN REGULAR(HUMAN) IN NACL 100-0.9 UT/100ML-% IV SOLN
INTRAVENOUS | Status: DC
  Administered 2020-06-23 – 2020-06-24 (×2): 13 [IU]/h via INTRAVENOUS
  Filled 2020-06-23 (×2): qty 100

## 2020-06-23 MED ORDER — MORPHINE SULFATE (PF) 2 MG/ML IV SOLN
2.0000 mg | Freq: Once | INTRAVENOUS | Status: AC
  Administered 2020-06-23: 2 mg via INTRAVENOUS
  Filled 2020-06-23: qty 1

## 2020-06-23 MED ORDER — FLUTICASONE-UMECLIDIN-VILANT 100-62.5-25 MCG/INH IN AEPB: 1.0000 | INHALATION_SPRAY | Freq: Every day | RESPIRATORY_TRACT | Status: DC

## 2020-06-23 MED ORDER — SODIUM CHLORIDE 0.9 % IV SOLN: INTRAVENOUS | Status: DC

## 2020-06-23 MED ORDER — SPIRONOLACTONE 25 MG PO TABS
25.0000 mg | ORAL_TABLET | Freq: Every day | ORAL | Status: DC
  Administered 2020-06-24: 25 mg via ORAL
  Filled 2020-06-23 (×4): qty 1

## 2020-06-23 MED ORDER — ONDANSETRON HCL 4 MG PO TABS: 4.0000 mg | ORAL_TABLET | Freq: Four times a day (QID) | ORAL | Status: DC | PRN

## 2020-06-23 MED ORDER — HYDROCODONE-ACETAMINOPHEN 10-325 MG PO TABS
1.0000 | ORAL_TABLET | Freq: Four times a day (QID) | ORAL | Status: DC | PRN
  Administered 2020-06-27 – 2020-06-28 (×4): 1 via ORAL
  Filled 2020-06-23 (×6): qty 1

## 2020-06-23 MED ORDER — ALBUTEROL SULFATE HFA 108 (90 BASE) MCG/ACT IN AERS
4.0000 | INHALATION_SPRAY | Freq: Once | RESPIRATORY_TRACT | Status: AC
  Administered 2020-06-23: 4 via RESPIRATORY_TRACT
  Filled 2020-06-23: qty 6.7

## 2020-06-23 NOTE — H&P (Addendum)
TRH H&P    Patient Demographics:    Kenneth Patrick, is a 69 y.o. male  MRN: [id number]  DOB - [birth year]  Admit Date - 06/23/2020  Referring MD/NP/PA: Jillyn Ledger  Outpatient Primary MD for the patient is [name], MD  Patient coming from: Home  Chief complaint- hyperglycemia   HPI:    Kenneth Patrick  is a 69 y.o. male, with a history of sleep apnea, HLD, DMII, COPD, and cancer who presents to the ED with a chief complaint of  Hyperglycemia. Patient is supposed to be on 60units of insulin with supper. He reports that he hasn't taken it in some time because he has not been eating. His wife reports that yesterday he started experiencing polydipsia. He drank some water, but mostly regular gatorade, and dr. Malachi Bonds. Patient reports that he hasn't been checking his glucose. His wife checked it today and the meter read high, so they came to the ER. Patient reports not taking his insulin because he has not been eating. Last normal meal was last week, and wife believes he only had one meal total last week. Patient reports that he suspected his diet was not good for DMII, so he just stopped eating.  In addition to this, he has been having increasing dyspnea over the last 2 weeks. He has had associated weakness. He hasn't been able to walk for past two days. Wife has been pushing him in an office chair to the bathroom. He has been incontinent since yesterday, with polyuria that is likely due to the hyperglycemia. He has had a productive cough as well, with yellow sputum. No hemoptysis. He has had no fevers, no sick contacts. Patient is not vaccinated for covid, he reports that he "doesn't trust it."   Of note, patient has had increasing back pain over a couple of months. Patient reports that he had imaging done that showed likely neoplasm, and he has been following with Dr. Raliegh Ip. He is supposed to have a biopsy on 06/25/20. For this  reason, he will be admitted to [facility] long.  ED course T 98.8, P 95, R 33, BP 148/57 PH 7.399, CO2 25, Gap 15, Glucose 895 WBC 10.7 BUN/Cr 35/1.66 CTA =  No PE. Primary lung cancer with mets.  Albuterol, aspirin, LR 49ml, famotidine 20mg , insulin drip BNP 39    Review of systems:    In addition to the HPI above,  Review of Systems  Constitutional: Positive for malaise/fatigue. Negative for chills and fever.  HENT: Negative for congestion, nosebleeds and sore throat.   Eyes: Positive for blurred vision. Negative for double vision and photophobia.  Respiratory: Positive for cough, sputum production and shortness of breath. Negative for wheezing.   Cardiovascular: Negative for chest pain, palpitations and leg swelling.  Gastrointestinal: Positive for abdominal pain, nausea and vomiting. Negative for diarrhea.  Genitourinary: Positive for frequency. Negative for dysuria and urgency.  Musculoskeletal: Positive for back pain. Negative for myalgias.  Skin: Negative for rash.  Neurological: Positive for weakness. Negative for speech change and focal weakness.  Endo/Heme/Allergies: Positive for polydipsia.  Psychiatric/Behavioral: Negative for substance abuse.    All other systems reviewed and are negative.    Past History of the following :    Past Medical History:  Diagnosis Date  . Cancer ([place])    basal cell   . COPD (chronic obstructive pulmonary disease) (HCC)    uses inhalers  . Depression   . Diabetic neuropathy ([place])   . DM2 (diabetes mellitus, type 2) (HCC)    insulin dependent  . Dyspnea    with exertion  . Hypercholesterolemia   . Low back pain    no meds  . Respiratory infection   . Sleep apnea    uses CPAP      Past Surgical History:  Procedure Laterality Date  . APPLICATION OF A-CELL OF HEAD/NECK Left 11/01/2018   Procedure: APPLICATION OF A-CELL TO SCALP WOUND;  Surgeon: Wallace Going, DO;  Location: [place];  Service: Plastics;   Laterality: Left;  . CATARACT EXTRACTION W/PHACO Right 04/21/2019   Procedure: CATARACT EXTRACTION PHACO AND INTRAOCULAR LENS PLACEMENT RIGHT EYE;  Surgeon: Baruch Goldmann, MD;  Location: [facility];  Service: Ophthalmology;  Laterality: Right;  right  . CHOLECYSTECTOMY    . DEBRIDEMENT AND CLOSURE WOUND Left 11/01/2018   Procedure: Debridement scalp wound;  Surgeon: Wallace Going, DO;  Location: [facility];  Service: Plastics;  Laterality: Left;  . left eye enucleation following trauma        Social History:      Social History   Tobacco Use  . Smoking status: Former Smoker    Packs/day: 1.50    Years: 20.00    Pack years: 30.00    Types: Cigarettes    Start date: 65    Quit date: 10/05/2004    Years since quitting: 15.7  . Smokeless tobacco: Never Used  Substance Use Topics  . Alcohol use: No    Alcohol/week: 0.0 standard drinks       Family History :     Family History  Problem Relation Age of Onset  . Leukemia Father   . Hypertension Father   . Diabetes Mother   . Ovarian cancer Sister   . Kidney disease Sister   . Hypertension Son   . Hypertension Daughter       Home Medications:   Prior to Admission medications   Medication Sig Start Date End Date Taking? Authorizing Provider  albuterol (VENTOLIN HFA) 108 (90 BASE) MCG/ACT inhaler Inhale 2 puffs into the lungs every 6 (six) hours as needed for wheezing or shortness of breath.     [provider]  ALPRAZolam Duanne Moron) 1 MG tablet Take 1 tablet (1 mg total) by mouth at bedtime as needed for anxiety. 06/17/20   Derek Jack, MD  atorvastatin (LIPITOR) 80 MG tablet Take 80 mg by mouth at bedtime.  05/01/11   [provider]  Continuous Blood Gluc Sensor (FREESTYLE LIBRE SENSOR SYSTEM) MISC Use one sensor every 10 days. 07/19/17   Cassandria Anger, MD  diltiazem (TIAZAC) 240 MG 24 hr capsule Take 1 capsule (240 mg total) by mouth daily. 04/15/20   Patwardhan, Reynold Bowen,  MD  Fluticasone-Umeclidin-Vilant (TRELEGY ELLIPTA) 100-62.5-25 MCG/INH AEPB Inhale 1 puff into the lungs daily. 08/15/19   Rigoberto Noel, MD  furosemide (LASIX) 40 MG tablet Take 40 mg by mouth daily as needed for fluid.     [provider]  gabapentin (NEURONTIN) 300 MG capsule Take 300 mg  by mouth 2 (two) times daily.     [provider]  glipiZIDE (GLUCOTROL) 5 MG tablet TAKE 1 TABLET BY MOUTH TWICE DAILY BEFORE MEAL(S) 02/28/20   Nida, Marella Chimes, MD  HYDROcodone-acetaminophen (NORCO) 10-325 MG tablet Take 1 tablet by mouth 3 (three) times daily as needed. 05/30/20   [provider]  HYDROmorphone (DILAUDID) 2 MG tablet Take 1 tablet (2 mg total) by mouth every 3 (three) hours as needed for severe pain. 06/04/20   Derek Jack, MD  insulin regular human CONCENTRATED (HUMULIN R U-500 KWIKPEN) 500 UNIT/ML kwikpen Inject 80 units with breakfast and Lunch, and 60 units with supper if BG is above 90 04/16/20   Nida, Marella Chimes, MD  oxymetazoline (AFRIN) 0.05 % nasal spray Place 1-2 sprays into both nostrils 2 (two) times daily as needed for congestion.     [provider]  OZEMPIC, 0.25 OR 0.5 MG/DOSE, 2 MG/1.5ML SOPN INJECT 0.5 MG  INTO THE SKIN ONCE A WEEK 06/06/20   Cassandria Anger, MD  sertraline (ZOLOFT) 100 MG tablet Take 200 mg by mouth daily.     [provider]  spironolactone (ALDACTONE) 25 MG tablet Take 1 tablet by mouth once daily 11/15/19   Patwardhan, Manish J, MD  tiZANidine (ZANAFLEX) 4 MG tablet Take 4 mg by mouth 2 (two) times daily. 05/17/20   [provider]  tobramycin (TOBREX) 0.3 % ophthalmic solution Place 1 drop into the right eye See admin instructions. Instill 1 drop into the right eye 3 times daily, the day before, the day of and the day after eye injection done every 6 weeks. 12/07/18   [provider]  CARTIA XT 120 MG 24 hr capsule Take 1 capsule by mouth once daily 10/18/19 04/15/20  Patwardhan,  Reynold Bowen, MD     Allergies:     Allergies  Allergen Reactions  . Penicillins Swelling and Rash    Did it involve swelling of the face/tongue/throat, SOB, or low BP? Yes Did it involve sudden or severe rash/hives, skin peeling, or any reaction on the inside of your mouth or nose? No Did you need to seek medical attention at a hospital or doctor's office? Yes When did it last happen?Childhood allergy If all above answers are "NO", may proceed with cephalosporin use.       Physical Exam:   Vitals  Blood pressure (!) 148/57, pulse 95, temperature 98.8 F (37.1 C), temperature source Oral, resp. rate (!) 33, height 6\' 4"  (1.93 m), weight (!) 162.7 kg, SpO2 95 %.  1.  General: Laying supine in bed Using accessory muscles to breath  2. Psychiatric: Pleasant, and cooperative Little insight into healthcare  3. Neurologic: Chronic left eyelid droop Otherwise - Cranial nerves intact Equal sensation in the upper and lower extremities BL  4. HEENMT:  Head is atraumatic normocephalic, pupils reactive to light Sclera are clear Mucous membranes dry Trachea midline Neck is supple  5. Respiratory : Tachypnea Accessory muscle use No wheezing Diminished breath sounds in the lower lung fields BL  6. Cardiovascular : HR tachycardic No murmur rub or gallop  7. Gastrointestinal:  Abdomen obese, soft, non distended, bowel sounds active  8. Skin:  Venous stasis changes of the lower extremities BL  9.Musculoskeletal:  Peripheral edema    Data Review:    CBC Recent Labs  Lab 06/23/20 1540  WBC 10.7*  HGB 10.7*  HCT 34.1*  PLT 348  MCV 90.2  MCH 28.3  MCHC 31.4  RDW 15.2  LYMPHSABS 0.6*  MONOABS 0.7  EOSABS 0.0  BASOSABS 0.0   ------------------------------------------------------------------------------------------------------------------  Results for orders placed or performed during the hospital encounter of 06/23/20 (from the past 48 hour(s))  CBC  with Differential     Status: Abnormal   Collection Time: 06/23/20  3:40 PM  Result Value Ref Range   WBC 10.7 (H) 4.0 - 10.5 K/uL   RBC 3.78 (L) 4.22 - 5.81 MIL/uL   Hemoglobin 10.7 (L) 13.0 - 17.0 g/dL   HCT 34.1 (L) 39 - 52 %   MCV 90.2 80.0 - 100.0 fL   MCH 28.3 26.0 - 34.0 pg   MCHC 31.4 30.0 - 36.0 g/dL   RDW 15.2 11.5 - 15.5 %   Platelets 348 150 - 400 K/uL   nRBC 0.0 0.0 - 0.2 %   Neutrophils Relative % 87 %   Neutro Abs 9.3 (H) 1.7 - 7.7 K/uL   Lymphocytes Relative 5 %   Lymphs Abs 0.6 (L) 0.7 - 4.0 K/uL   Monocytes Relative 7 %   Monocytes Absolute 0.7 0 - 1 K/uL   Eosinophils Relative 0 %   Eosinophils Absolute 0.0 0 - 0 K/uL   Basophils Relative 0 %   Basophils Absolute 0.0 0 - 0 K/uL   Immature Granulocytes 1 %   Abs Immature Granulocytes 0.07 0.00 - 0.07 K/uL    Comment: Performed at [facility], [address] [place], [place] [address]  Comprehensive metabolic panel     Status: Abnormal   Collection Time: 06/23/20  3:40 PM  Result Value Ref Range   Sodium 122 (L) 135 - 145 mmol/L   Potassium 4.7 3.5 - 5.1 mmol/L   Chloride 82 (L) 98 - 111 mmol/L   CO2 25 22 - 32 mmol/L   Glucose, Bld 895 (HH) 70 - 99 mg/dL    Comment: Glucose reference range applies only to samples taken after fasting for at least 8 hours. CRITICAL RESULT CALLED TO, READ BACK BY AND VERIFIED WITH: [facility] @1646  06/23/2020 KAY    BUN 35 (H) 8 - 23 mg/dL   Creatinine, Ser 1.66 (H) 0.61 - 1.24 mg/dL   Calcium 10.3 8.9 - 10.3 mg/dL   Total Protein 7.3 6.5 - 8.1 g/dL   Albumin 3.2 (L) 3.5 - 5.0 g/dL   AST 12 (L) 15 - 41 U/L   ALT 19 0 - 44 U/L   Alkaline Phosphatase 225 (H) 38 - 126 U/L   Total Bilirubin 1.2 0.3 - 1.2 mg/dL   GFR calc non Af Amer 42 (L) >60 mL/min   GFR calc Af Amer 48 (L) >60 mL/min   Anion gap 15 5 - 15    Comment: Performed at [facility], [address] [place], [place] [address]  Brain natriuretic peptide     Status: None   Collection Time: 06/23/20  3:40  PM  Result Value Ref Range   B Natriuretic Peptide 39.0 0.0 - 100.0 pg/mL    Comment: Performed at [facility], [address] [place], [place] [address]  Troponin I (High Sensitivity)     Status: None   Collection Time: 06/23/20  3:40 PM  Result Value Ref Range   Troponin I (High Sensitivity) 11 <18 ng/L    Comment: (NOTE) Elevated high sensitivity troponin I (hsTnI) values and significant  changes across serial measurements may suggest ACS but many other  chronic and acute conditions are known to elevate hsTnI results.  Refer to  the "Links" section for chest pain algorithms and additional  guidance. Performed at [facility], [address] [place], [place] [address]   SARS Coronavirus 2 by RT PCR (hospital order, performed in [facility] hospital lab) Nasopharyngeal Nasopharyngeal Swab     Status: None   Collection Time: 06/23/20  3:54 PM   Specimen: Nasopharyngeal Swab  Result Value Ref Range   SARS Coronavirus 2 NEGATIVE NEGATIVE    Comment: (NOTE) SARS-CoV-2 target nucleic acids are NOT DETECTED.  The SARS-CoV-2 RNA is generally detectable in upper and lower respiratory specimens during the acute phase of infection. The lowest concentration of SARS-CoV-2 viral copies this assay can detect is 250 copies / mL. A negative result does not preclude SARS-CoV-2 infection and should not be used as the sole basis for treatment or other patient management decisions.  A negative result may occur with improper specimen collection / handling, submission of specimen other than nasopharyngeal swab, presence of viral mutation(s) within the areas targeted by this assay, and inadequate number of viral copies (<250 copies / mL). A negative result must be combined with clinical observations, patient history, and epidemiological information.  Fact Sheet for Patients:   StrictlyIdeas.no  Fact Sheet for Healthcare  Providers: BankingDealers.co.za  This test is not yet approved or  cleared by the [place] FDA and has been authorized for detection and/or diagnosis of SARS-CoV-2 by FDA under an Emergency Use Authorization (EUA).  This EUA will remain in effect (meaning this test can be used) for the duration of the COVID-19 declaration under Section 564(b)(1) of the Act, 21 U.S.C. section 360bbb-3(b)(1), unless the authorization is terminated or revoked sooner.  Performed at [facility], [address] [place], [place] [address]   Lactic acid, plasma     Status: None   Collection Time: 06/23/20  3:54 PM  Result Value Ref Range   Lactic Acid, Venous 1.6 0.5 - 1.9 mmol/L    Comment: Performed at [facility], [address], [place], [place] [address]  POC CBG, ED     Status: Abnormal   Collection Time: 06/23/20  4:39 PM  Result Value Ref Range   Glucose-Capillary >600 (HH) 70 - 99 mg/dL    Comment: Glucose reference range applies only to samples taken after fasting for at least 8 hours.  Blood gas, venous (at [facility] and [facility], not at [facility])     Status: Abnormal   Collection Time: 06/23/20  5:00 PM  Result Value Ref Range   FIO2 28.00    pH, Ven 7.399 7.25 - 7.43   pCO2, Ven 47.0 44 - 60 mmHg   pO2, Ven 85.1 (H) 32 - 45 mmHg   Bicarbonate 27.5 20.0 - 28.0 mmol/L   Acid-Base Excess 3.8 (H) 0.0 - 2.0 mmol/L   O2 Saturation 95.1 %   Patient temperature 37.0     Comment: Performed at [facility], [address] [place], [place] [address]  Troponin I (High Sensitivity)     Status: None   Collection Time: 06/23/20  6:39 PM  Result Value Ref Range   Troponin I (High Sensitivity) 10 <18 ng/L    Comment: (NOTE) Elevated high sensitivity troponin I (hsTnI) values and significant  changes across serial measurements may suggest ACS but many other  chronic and acute conditions are known to elevate hsTnI results.  Refer to the "Links" section for chest pain algorithms and additional   guidance. Performed at [facility], [address] [place], [place] [address]  CBG monitoring, ED     Status: Abnormal   Collection Time: 06/23/20  7:51 PM  Result Value Ref Range   Glucose-Capillary >600 (HH) 70 - 99 mg/dL    Comment: Glucose reference range applies only to samples taken after fasting for at least 8 hours.    Chemistries  Recent Labs  Lab 06/23/20 1540  NA 122*  K 4.7  CL 82*  CO2 25  GLUCOSE 895*  BUN 35*  CREATININE 1.66*  CALCIUM 10.3  AST 12*  ALT 19  ALKPHOS 225*  BILITOT 1.2   ------------------------------------------------------------------------------------------------------------------  ------------------------------------------------------------------------------------------------------------------ GFR: Estimated Creatinine Clearance: 70.6 mL/min (A) (by C-G formula based on SCr of 1.66 mg/dL (H)). Liver Function Tests: Recent Labs  Lab 06/23/20 1540  AST 12*  ALT 19  ALKPHOS 225*  BILITOT 1.2  PROT 7.3  ALBUMIN 3.2*   No results for input(s): LIPASE, AMYLASE in the last 168 hours. No results for input(s): AMMONIA in the last 168 hours. Coagulation Profile: No results for input(s): INR, PROTIME in the last 168 hours. Cardiac Enzymes: No results for input(s): CKTOTAL, CKMB, CKMBINDEX, TROPONINI in the last 168 hours. BNP (last 3 results) No results for input(s): PROBNP in the last 8760 hours. HbA1C: No results for input(s): HGBA1C in the last 72 hours. CBG: Recent Labs  Lab 06/23/20 1639 06/23/20 1951  GLUCAP >600* >600*   Lipid Profile: No results for input(s): CHOL, HDL, LDLCALC, TRIG, CHOLHDL, LDLDIRECT in the last 72 hours. Thyroid Function Tests: No results for input(s): TSH, T4TOTAL, FREET4, T3FREE, THYROIDAB in the last 72 hours. Anemia Panel: No results for input(s): VITAMINB12, FOLATE, FERRITIN, TIBC, IRON, RETICCTPCT in the last 72  hours.  --------------------------------------------------------------------------------------------------------------- Urine analysis:    Component Value Date/Time   COLORURINE YELLOW 11/29/2013 1452   APPEARANCEUR CLEAR 11/29/2013 1452   LABSPEC 1.027 11/29/2013 1452   PHURINE 6.0 11/29/2013 1452   GLUCOSEU >1000 (A) 11/29/2013 1452   HGBUR SMALL (A) 11/29/2013 1452   BILIRUBINUR NEGATIVE 11/29/2013 1452   KETONESUR NEGATIVE 11/29/2013 1452   PROTEINUR NEGATIVE 11/29/2013 1452   UROBILINOGEN 0.2 11/29/2013 1452   NITRITE NEGATIVE 11/29/2013 1452   LEUKOCYTESUR NEGATIVE 11/29/2013 1452      Imaging Results:    CT Angio Chest PE W and/or Wo Contrast  Result Date: 06/23/2020 CLINICAL DATA:  Shortness of breath and cough x2 weeks. EXAM: CT ANGIOGRAPHY CHEST WITH CONTRAST TECHNIQUE: Multidetector CT imaging of the chest was performed using the standard protocol during bolus administration of intravenous contrast. Multiplanar CT image reconstructions and MIPs were obtained to evaluate the vascular anatomy. CONTRAST:  48mL OMNIPAQUE IOHEXOL 350 MG/ML SOLN COMPARISON:  April 16, 2017 FINDINGS: Cardiovascular: Satisfactory opacification of the pulmonary arteries to the segmental level. No evidence of pulmonary embolism. Mild artifact is seen overlying the pulmonary arteries within the perihilar region on the left. Normal heart size. No pericardial effusion. Mediastinum/Nodes: No enlarged mediastinal, hilar, or axillary lymph nodes. Thyroid gland, trachea, and esophagus demonstrate no significant findings. Lungs/Pleura: Mild atelectasis and/or early infiltrate is seen within the left lung base. A 4.9 cm x 3.2 cm heterogeneous low-attenuation mass is seen within the left apex. Associated areas of cortical destruction are seen involving the first and second left ribs, as well as the adjacent portions of the C7 and T1 vertebral bodies. A 2.6 cm x 1.4 cm soft tissue mass is seen along the anterolateral  aspect of the right chest wall. This originates from the fifth right rib. There is no evidence of  a pleural effusion or pneumothorax. Upper Abdomen: A 4.9 cm x 2.9 cm area of heterogeneous low attenuation is seen along the medial aspect of the liver dome. Surgical clips are noted within the gallbladder fossa. 1.5 cm 1.9 cm lymph nodes are seen along the posteromedial aspect of the inferior vena cava. Musculoskeletal: A 4.3 cm x 2.4 cm soft tissue mass is seen adjacent to the posterior aspect of the proximal left clavicle (axial CT images 1 through 17, CT series number 5). A 1.3 cm x 1.0 cm mildly expansile lytic lesion is seen within the posterior aspect of the fifth left rib. Similar appearing lesions are seen within the posteromedial aspect of the tenth left rib, anterolateral aspect of the seventh left rib, posterior seventh right rib, posterolateral aspect of the eighth right rib and lateral fourth right rib. A nondisplaced 12 left rib fracture is present. A chronic posterior eighth right rib fracture is seen. Multilevel degenerative changes seen throughout the thoracic spine. Multiple ill-defined lytic lesions are noted throughout multiple thoracic spine vertebral bodies. Review of the MIP images confirms the above findings. IMPRESSION: 1. No evidence of pulmonary embolism. 2. Heterogeneous left apical soft tissue mass with associated areas of cortical destruction involving the first and second left ribs, as well as the adjacent portions of the C7 and T1 vertebral bodies. This is consistent with primary lung malignancy. 3. 2.6 cm x 1.4 cm soft tissue mass along the anterolateral aspect of the right chest wall, which originates from the fifth right rib. This is concerning for metastatic disease. 4. Multiple ill-defined lytic lesions throughout multiple thoracic spine vertebral bodies and multiple ribs, consistent with osseous metastasis. 5. 4.9 cm x 2.9 cm area of heterogeneous low attenuation along the medial  aspect of the liver dome. This is suspicious for a metastatic lesion. 6. Mild atelectasis and/or early infiltrate within the left lung base. 7. Evidence of prior cholecystectomy. Electronically Signed   By: Virgina Norfolk M.D.   On: 06/23/2020 19:35   DG Chest Portable 1 View  Result Date: 06/23/2020 CLINICAL DATA:  Hypoxia with shortness of breath and cough 2 weeks. History of lung cancer. EXAM: PORTABLE CHEST 1 VIEW COMPARISON:  04/30/2020 and PET-CT 06/13/2020 FINDINGS: Lungs are adequately inflated demonstrate worsening pleural base mass over the lateral right mid to lower thorax. Right eighth posterior rib fracture which may be pathologic. Lungs are adequately inflated without acute airspace process or effusion. Cardiomediastinal silhouette is normal. Suggestion of lytic process involving the right posterior seventh rib. IMPRESSION: 1. No acute airspace process. 2. Worsening pleural based mass over the lateral right mid to lower thorax. Right eighth posterior rib fracture which may be pathologic. Possible lytic process involving the right posterior seventh rib. These findings are likely due to metastatic disease. Electronically Signed   By: Marin Olp M.D.   On: 06/23/2020 16:25    My personal review of EKG: Rhythm ST, Rate 93 /min, QTc 468 ,no Acute ST changes   Assessment & Plan:    Active Problems:   Respiratory failure with hypoxia ([place])   1. Respiratory failure with hypoxia 1. O2 sats down to 86% 2. Tachypnic to mid 19s RR 3. Stabilized on 3LNC  4. Likely 2/2 Neoplasm and COPD 5. ABG shows no CO2 retention 6. CT ruled out PE 7. Wean off O2 as tolerated 8. Monitor on tele 2. Hyperglycemia 1. In the setting of DMII 2. Glucose of 895 on presentation 3. Started on endotool 4. Continue to monitor 3.  COPD 1. PCO2 = WNL 2. Continue albuterol and duonebs as needed 3. Continue to monitor 4.    DVT Prophylaxis-   Heparin - SCDs   AM Labs Ordered, also please review Full  Orders  Family Communication: Admission, patients condition and plan of care including tests being ordered have been discussed with the patient and wife who indicate understanding and agree with the plan and Code Status.  Code Status:  Full  Admission status: Inpatient :The appropriate admission status for this patient is INPATIENT. Inpatient status is judged to be reasonable and necessary in order to provide the required intensity of service to ensure the patient's safety. The patient's presenting symptoms, physical exam findings, and initial radiographic and laboratory data in the context of their chronic comorbidities is felt to place them at high risk for further clinical deterioration. Furthermore, it is not anticipated that the patient will be medically stable for discharge from the hospital within 2 midnights of admission. The following factors support the admission status of inpatient.     The patient's presenting symptoms include dyspnea and hyperglycemia The worrisome physical exam findings include Hypoxia down to 86% The initial radiographic and laboratory data are worrisome because of metastatic lung cancer The chronic co-morbidities include OSA, HLD, DMII, COPD, Cancer       * I certify that at the point of admission it is my clinical judgment that the patient will require inpatient hospital care spanning beyond 2 midnights from the point of admission due to high intensity of service, high risk for further deterioration and high frequency of surveillance required.*  Time spent in minutes : [place]

## 2020-06-23 NOTE — ED Provider Notes (Signed)
[facility] EMERGENCY DEPARTMENT Provider Note   CSN: [id number] Arrival date & time: 06/23/20  1503     History Chief Complaint  Patient presents with  . Hyperglycemia    Kenneth Patrick is a 69 y.o. male presented for evaluation of high blood sugar, shortness of breath, weakness.  Patient states he has been short of breath for several months, significantly worse over the past couple weeks.  He has had increased thirst and urine output, thus they checked his blood sugars which read high.  He was given insulin, but his blood sugars continue to read high.  Patient states he is so short of breath he is unable to walk to the bathroom.  He also reports a mild cough, productive of yellow sputum.  He states he has not been taking his medication for several weeks to months due to having "pain all over."  Patient does not wear oxygen at home.  He reports a history of asthma COPD, has not been using his inhalers or his cpap.  He has had recently identified cancer, has not started treatment for this.  Per recent imaging, patient was noted to have right-sided broken ribs.   Additional history obtained from chart review.  Patient with a history of COPD, diabetes, depression, hypercholesterolemia, sleep apnea, cancer  HPI     Past Medical History:  Diagnosis Date  . Cancer ([place])    basal cell   . COPD (chronic obstructive pulmonary disease) (HCC)    uses inhalers  . Depression   . Diabetic neuropathy ([place])   . DM2 (diabetes mellitus, type 2) (HCC)    insulin dependent  . Dyspnea    with exertion  . Hypercholesterolemia   . Low back pain    no meds  . Respiratory infection   . Sleep apnea    uses CPAP    Patient Active Problem List   Diagnosis Date Noted  . Healthcare maintenance 05/26/2019  . Mohs defect of scalp 10/28/2018  . Cor pulmonale, chronic ([place]) 01/27/2017  . Mixed hyperlipidemia 07/06/2016  . Essential hypertension 09/26/2015  . Morbid obesity ([place]) 09/26/2015  .  Chronic obstructive pulmonary disease ([place]) 08/08/2010  . Uncontrolled diabetes mellitus with complications ([place]) 29/92/4268  . OBESITY, UNSPECIFIED 04/08/2010  . ALLERGIC RHINITIS CAUSE UNSPECIFIED 04/08/2010  . ASTHMA, UNSPECIFIED, UNSPECIFIED STATUS 04/08/2010  . LOW BACK PAIN SYNDROME 04/08/2010  . OSA (obstructive sleep apnea) 04/08/2010  . Exertional dyspnea 04/08/2010    Past Surgical History:  Procedure Laterality Date  . APPLICATION OF A-CELL OF HEAD/NECK Left 11/01/2018   Procedure: APPLICATION OF A-CELL TO SCALP WOUND;  Surgeon: Wallace Going, DO;  Location: [facility];  Service: Plastics;  Laterality: Left;  . CATARACT EXTRACTION W/PHACO Right 04/21/2019   Procedure: CATARACT EXTRACTION PHACO AND INTRAOCULAR LENS PLACEMENT RIGHT EYE;  Surgeon: Baruch Goldmann, MD;  Location: [facility];  Service: Ophthalmology;  Laterality: Right;  right  . CHOLECYSTECTOMY    . DEBRIDEMENT AND CLOSURE WOUND Left 11/01/2018   Procedure: Debridement scalp wound;  Surgeon: Wallace Going, DO;  Location: [facility];  Service: Plastics;  Laterality: Left;  . left eye enucleation following trauma         Family History  Problem Relation Age of Onset  . Leukemia Father   . Hypertension Father   . Diabetes Mother   . Ovarian cancer Sister   . Kidney disease Sister   . Hypertension Son   . Hypertension Daughter  Social History   Tobacco Use  . Smoking status: Former Smoker    Packs/day: 1.50    Years: 20.00    Pack years: 30.00    Types: Cigarettes    Start date: 44    Quit date: 10/05/2004    Years since quitting: 15.7  . Smokeless tobacco: Never Used  Vaping Use  . Vaping Use: Never used  Substance Use Topics  . Alcohol use: No    Alcohol/week: 0.0 standard drinks  . Drug use: No    Comment: stopped smoking marijuana in 2006    Home Medications Prior to Admission medications   Medication Sig Start Date End Date Taking? Authorizing  Provider  albuterol (VENTOLIN HFA) 108 (90 BASE) MCG/ACT inhaler Inhale 2 puffs into the lungs every 6 (six) hours as needed for wheezing or shortness of breath.     [provider]  ALPRAZolam Duanne Moron) 1 MG tablet Take 1 tablet (1 mg total) by mouth at bedtime as needed for anxiety. 06/17/20   Derek Jack, MD  atorvastatin (LIPITOR) 80 MG tablet Take 80 mg by mouth at bedtime.  05/01/11   [provider]  Continuous Blood Gluc Sensor (FREESTYLE LIBRE SENSOR SYSTEM) MISC Use one sensor every 10 days. 07/19/17   Cassandria Anger, MD  diltiazem (TIAZAC) 240 MG 24 hr capsule Take 1 capsule (240 mg total) by mouth daily. 04/15/20   Patwardhan, Reynold Bowen, MD  Fluticasone-Umeclidin-Vilant (TRELEGY ELLIPTA) 100-62.5-25 MCG/INH AEPB Inhale 1 puff into the lungs daily. 08/15/19   Rigoberto Noel, MD  furosemide (LASIX) 40 MG tablet Take 40 mg by mouth daily as needed for fluid.     [provider]  gabapentin (NEURONTIN) 300 MG capsule Take 300 mg by mouth 2 (two) times daily.     [provider]  glipiZIDE (GLUCOTROL) 5 MG tablet TAKE 1 TABLET BY MOUTH TWICE DAILY BEFORE MEAL(S) 02/28/20   Nida, Marella Chimes, MD  HYDROcodone-acetaminophen (NORCO) 10-325 MG tablet Take 1 tablet by mouth 3 (three) times daily as needed. 05/30/20   [provider]  HYDROmorphone (DILAUDID) 2 MG tablet Take 1 tablet (2 mg total) by mouth every 3 (three) hours as needed for severe pain. 06/04/20   Derek Jack, MD  insulin regular human CONCENTRATED (HUMULIN R U-500 KWIKPEN) 500 UNIT/ML kwikpen Inject 80 units with breakfast and Lunch, and 60 units with supper if BG is above 90 04/16/20   Nida, Marella Chimes, MD  oxymetazoline (AFRIN) 0.05 % nasal spray Place 1-2 sprays into both nostrils 2 (two) times daily as needed for congestion.     [provider]  OZEMPIC, 0.25 OR 0.5 MG/DOSE, 2 MG/1.5ML SOPN INJECT 0.5 MG  INTO THE SKIN ONCE A WEEK 06/06/20   Cassandria Anger, MD  sertraline (ZOLOFT) 100 MG tablet Take 200 mg by mouth daily.     [provider]  spironolactone (ALDACTONE) 25 MG tablet Take 1 tablet by mouth once daily 11/15/19   Patwardhan, Manish J, MD  tiZANidine (ZANAFLEX) 4 MG tablet Take 4 mg by mouth 2 (two) times daily. 05/17/20   [provider]  tobramycin (TOBREX) 0.3 % ophthalmic solution Place 1 drop into the right eye See admin instructions. Instill 1 drop into the right eye 3 times daily, the day before, the day of and the day after eye injection done every 6 weeks. 12/07/18   [provider]  CARTIA XT 120 MG 24 hr capsule Take 1 capsule by mouth once daily  10/18/19 04/15/20  Nigel Mormon, MD    Allergies    Penicillins  Review of Systems   Review of Systems  Respiratory: Positive for cough and shortness of breath.   Endocrine: Positive for polydipsia and polyuria.  All other systems reviewed and are negative.   Physical Exam Updated Vital Signs BP (!) 148/57 (BP Location: Right Arm)   Pulse 95   Temp 98.8 F (37.1 C) (Oral)   Resp (!) 33   Ht 6\' 4"  (1.93 m)   Wt (!) 162.7 kg   SpO2 95%   BMI 43.66 kg/m   Physical Exam Vitals and nursing note reviewed.  Constitutional:      General: He is in acute distress.     Appearance: He is well-developed. He is ill-appearing.     Comments: Patient appears ill.  Working hard to breathe.  HENT:     Head: Normocephalic and atraumatic.  Eyes:     Conjunctiva/sclera: Conjunctivae normal.     Pupils: Pupils are equal, round, and reactive to light.  Cardiovascular:     Rate and Rhythm: Normal rate and regular rhythm.     Pulses: Normal pulses.  Pulmonary:     Effort: Tachypnea, accessory muscle usage and respiratory distress present.     Breath sounds: Decreased air movement present. Wheezing present.     Comments: Patient with increased WOB.  I was present during transfer from wheelchair to bed, patient with significant accessory  muscle use during and after exertion.  Sats dropped to the 80s on 2 L oxygen with transfer, promptly returned to the 90s at rest.  Diminished lung sounds throughout with scattered expiratory wheeze.  tachypneic around 35-40. Abdominal:     General: There is no distension.     Palpations: Abdomen is soft. There is no mass.     Tenderness: There is no abdominal tenderness. There is no guarding or rebound.  Musculoskeletal:        General: Normal range of motion.     Cervical back: Normal range of motion and neck supple.     Right lower leg: Edema present.     Left lower leg: Edema present.     Comments: 3+ pitting edema bilaterally. Baseline per wife  Skin:    General: Skin is warm and dry.     Capillary Refill: Capillary refill takes less than 2 seconds.  Neurological:     Mental Status: He is alert and oriented to person, place, and time.     ED Results / Procedures / Treatments   Labs (all labs ordered are listed, but only abnormal results are displayed) Labs Reviewed  CBC WITH DIFFERENTIAL/PLATELET - Abnormal; Notable for the following components:      Result Value   WBC 10.7 (*)    RBC 3.78 (*)    Hemoglobin 10.7 (*)    HCT 34.1 (*)    Neutro Abs 9.3 (*)    Lymphs Abs 0.6 (*)    All other components within normal limits  COMPREHENSIVE METABOLIC PANEL - Abnormal; Notable for the following components:   Sodium 122 (*)    Chloride 82 (*)    Glucose, Bld 895 (*)    BUN 35 (*)    Creatinine, Ser 1.66 (*)    Albumin 3.2 (*)    AST 12 (*)    Alkaline Phosphatase 225 (*)    GFR calc non Af Amer 42 (*)    GFR calc Af Amer 48 (*)  All other components within normal limits  BLOOD GAS, VENOUS - Abnormal; Notable for the following components:   pO2, Ven 85.1 (*)    Acid-Base Excess 3.8 (*)    All other components within normal limits  CBG MONITORING, ED - Abnormal; Notable for the following components:   Glucose-Capillary >600 (*)    All other components within normal limits   CBG MONITORING, ED - Abnormal; Notable for the following components:   Glucose-Capillary >600 (*)    All other components within normal limits  SARS CORONAVIRUS 2 BY RT PCR (HOSPITAL ORDER, [place])  LACTIC ACID, PLASMA  BRAIN NATRIURETIC PEPTIDE  URINALYSIS, ROUTINE W REFLEX MICROSCOPIC  TROPONIN I (HIGH SENSITIVITY)  TROPONIN I (HIGH SENSITIVITY)    EKG EKG Interpretation  Date/Time:  Sunday June 23 2020 17:10:56 EDT Ventricular Rate:  93 PR Interval:    QRS Duration: 113 QT Interval:  376 QTC Calculation: 468 R Axis:   -58 Text Interpretation: Sinus rhythm Ventricular premature complex Aberrant conduction of SV complex(es) Left anterior fascicular block Low voltage, precordial leads Confirmed by Fredia Sorrow [id number]) on 06/23/2020 6:53:57 PM   Radiology CT Angio Chest PE W and/or Wo Contrast  Result Date: 06/23/2020 CLINICAL DATA:  Shortness of breath and cough x2 weeks. EXAM: CT ANGIOGRAPHY CHEST WITH CONTRAST TECHNIQUE: Multidetector CT imaging of the chest was performed using the standard protocol during bolus administration of intravenous contrast. Multiplanar CT image reconstructions and MIPs were obtained to evaluate the vascular anatomy. CONTRAST:  47mL OMNIPAQUE IOHEXOL 350 MG/ML SOLN COMPARISON:  April 16, 2017 FINDINGS: Cardiovascular: Satisfactory opacification of the pulmonary arteries to the segmental level. No evidence of pulmonary embolism. Mild artifact is seen overlying the pulmonary arteries within the perihilar region on the left. Normal heart size. No pericardial effusion. Mediastinum/Nodes: No enlarged mediastinal, hilar, or axillary lymph nodes. Thyroid gland, trachea, and esophagus demonstrate no significant findings. Lungs/Pleura: Mild atelectasis and/or early infiltrate is seen within the left lung base. A 4.9 cm x 3.2 cm heterogeneous low-attenuation mass is seen within the left apex. Associated areas of cortical  destruction are seen involving the first and second left ribs, as well as the adjacent portions of the C7 and T1 vertebral bodies. A 2.6 cm x 1.4 cm soft tissue mass is seen along the anterolateral aspect of the right chest wall. This originates from the fifth right rib. There is no evidence of a pleural effusion or pneumothorax. Upper Abdomen: A 4.9 cm x 2.9 cm area of heterogeneous low attenuation is seen along the medial aspect of the liver dome. Surgical clips are noted within the gallbladder fossa. 1.5 cm 1.9 cm lymph nodes are seen along the posteromedial aspect of the inferior vena cava. Musculoskeletal: A 4.3 cm x 2.4 cm soft tissue mass is seen adjacent to the posterior aspect of the proximal left clavicle (axial CT images 1 through 17, CT series number 5). A 1.3 cm x 1.0 cm mildly expansile lytic lesion is seen within the posterior aspect of the fifth left rib. Similar appearing lesions are seen within the posteromedial aspect of the tenth left rib, anterolateral aspect of the seventh left rib, posterior seventh right rib, posterolateral aspect of the eighth right rib and lateral fourth right rib. A nondisplaced 12 left rib fracture is present. A chronic posterior eighth right rib fracture is seen. Multilevel degenerative changes seen throughout the thoracic spine. Multiple ill-defined lytic lesions are noted throughout multiple thoracic spine vertebral bodies. Review of the  MIP images confirms the above findings. IMPRESSION: 1. No evidence of pulmonary embolism. 2. Heterogeneous left apical soft tissue mass with associated areas of cortical destruction involving the first and second left ribs, as well as the adjacent portions of the C7 and T1 vertebral bodies. This is consistent with primary lung malignancy. 3. 2.6 cm x 1.4 cm soft tissue mass along the anterolateral aspect of the right chest wall, which originates from the fifth right rib. This is concerning for metastatic disease. 4. Multiple  ill-defined lytic lesions throughout multiple thoracic spine vertebral bodies and multiple ribs, consistent with osseous metastasis. 5. 4.9 cm x 2.9 cm area of heterogeneous low attenuation along the medial aspect of the liver dome. This is suspicious for a metastatic lesion. 6. Mild atelectasis and/or early infiltrate within the left lung base. 7. Evidence of prior cholecystectomy. Electronically Signed   By: Virgina Norfolk M.D.   On: 06/23/2020 19:35   DG Chest Portable 1 View  Result Date: 06/23/2020 CLINICAL DATA:  Hypoxia with shortness of breath and cough 2 weeks. History of lung cancer. EXAM: PORTABLE CHEST 1 VIEW COMPARISON:  04/30/2020 and PET-CT 06/13/2020 FINDINGS: Lungs are adequately inflated demonstrate worsening pleural base mass over the lateral right mid to lower thorax. Right eighth posterior rib fracture which may be pathologic. Lungs are adequately inflated without acute airspace process or effusion. Cardiomediastinal silhouette is normal. Suggestion of lytic process involving the right posterior seventh rib. IMPRESSION: 1. No acute airspace process. 2. Worsening pleural based mass over the lateral right mid to lower thorax. Right eighth posterior rib fracture which may be pathologic. Possible lytic process involving the right posterior seventh rib. These findings are likely due to metastatic disease. Electronically Signed   By: Marin Olp M.D.   On: 06/23/2020 16:25    Procedures .Critical Care Performed by: Franchot Heidelberg, PA-C Authorized by: Franchot Heidelberg, PA-C   Critical care provider statement:    Critical care time (minutes):  50   Critical care time was exclusive of:  Separately billable procedures and treating other patients and teaching time   Critical care was necessary to treat or prevent imminent or life-threatening deterioration of the following conditions:  Respiratory failure and endocrine crisis   Critical care was time spent personally by me on the  following activities:  Blood draw for specimens, development of treatment plan with patient or surrogate, evaluation of patient's response to treatment, examination of patient, obtaining history from patient or surrogate, ordering and performing treatments and interventions, ordering and review of laboratory studies, ordering and review of radiographic studies, pulse oximetry, re-evaluation of patient's condition and review of old charts   I assumed direction of critical care for this patient from another provider in my specialty: no   Comments:     Pt with elevated cbg, started on insulin gtt. Also with hypoxia, requiring admission.    (including critical care time)  Medications Ordered in ED Medications  insulin regular, human (MYXREDLIN) 100 units/ 100 mL infusion (13 Units/hr Intravenous New Bag/Given 06/23/20 1918)  dextrose 5 % in lactated ringers infusion (0 mLs Intravenous Hold 06/23/20 1920)  dextrose 50 % solution 0-50 mL (has no administration in time range)  albuterol (VENTOLIN HFA) 108 (90 Base) MCG/ACT inhaler 4 puff (4 puffs Inhalation Given 06/23/20 1704)  aspirin chewable tablet 324 mg (324 mg Oral Given 06/23/20 1714)  famotidine (PEPCID) IVPB 20 mg premix (0 mg Intravenous Stopped 06/23/20 1744)  iohexol (OMNIPAQUE) 350 MG/ML injection 80 mL (80 mLs  Intravenous Contrast Given 06/23/20 1859)    ED Course  I have reviewed the triage vital signs and the nursing notes.  Pertinent labs & imaging results that were available during my care of the patient were reviewed by me and considered in my medical decision making (see chart for details).    MDM Rules/Calculators/A&P                          Patient presenting for evaluation of hyperglycemia and shortness of breath.  On exam, patient appears ill.  He is hypoxic on room air, improved with O2.  Increased work of breathing, diminished lung sounds throughout.  Consider COPD exacerbation.  As patient also has a cough, consider  pneumonia and/or Covid.  Consider CHF exacerbation due to pitting edema of lower extremities.  Consider DKA.  If work-up is negative, patient may need CTA to rule out PE.  Will obtain labs, chest x-ray, EKG, covid test.  Will start albuterol. Case discussed with attending, Dr. Rogene Houston evaluated the pt.   On reassessment, patient remains in the low 90s on oxygen.  He continues to be extremely tachypneic with increased work of breathing.  He is reporting significant heartburn, as such will add on troponin, start aspirin and Pepcid.  Labs interpreted by me, critically elevated glucose at 895.  Corrected anion gap of 30, corrected sodium of 135.  Bicarb is normal.  Mild leukocytosis of 10.  VBG overall reassuring, lactic negative.  X-ray without obvious pneumonia, doubt bacterial cause at this time.  Continue to consider Covid, COPD, or PE.  On reassessment, patient reports mild improvement of shortness of breath after albuterol, SOB may be related to COPD.  Heartburn improved after Pepcid.  Covid test negative.  Will obtain CTA to rule out PE.  Patient will need to be admitted afterwards for hyperglycemia and hypoxia.  CTA negative for PE.  Does show multiple fractures and signs of mets.  Will call for admission.  Discussed with Dr. Clearence Ped from [facility]ad hospitalist service, patient to be admitted.  Final Clinical Impression(s) / ED Diagnoses Final diagnoses:  Hyperglycemia  Hypoxia  Shortness of breath    Rx / DC Orders ED Discharge Orders    None       Franchot Heidelberg, PA-C 06/23/20 2011    Fredia Sorrow, MD 07/12/20 1615

## 2020-06-23 NOTE — ED Notes (Signed)
Date and time results received: 06/23/20 4:50 PM   Test: Glucose  Critical Value: 895  Name of Provider Notified: S. Caccavale, PA

## 2020-06-23 NOTE — ED Triage Notes (Signed)
Pt sent here due to blood sugars are reading "high", constant thirsty and incont. Of urine.  Pt is a cancer of lung, right ribs are broken.  Is not on chemo or radiation yet.

## 2020-06-23 NOTE — ED Notes (Signed)
Pt to CT by CT staff at this time.

## 2020-06-23 NOTE — ED Notes (Signed)
Entered room and introduced self to patient and family at the bedside at this time. Pt in hospital gown, with continuous cardiac monitoring in place at this time. Bed is locked in the lowest position, side rails x2, call bell within reach. All questions voiced addressed by this RN at this time. Will continue to monitor.

## 2020-06-23 NOTE — ED Notes (Signed)
ED Staff at bedside for USGIV placement at this time.

## 2020-06-23 NOTE — ED Notes (Signed)
This RN to bedside to assist phlebotomist at this time. Found patient to have accidentally removed IV to the left AC, and removed continuous cardiac monitor. Pt repositioned in bed, replaced cardiac monitor at this time. Close observation maintained and provider notified of new restless. Awaiting a page back at this time.

## 2020-06-24 ENCOUNTER — Other Ambulatory Visit: Payer: Self-pay | Admitting: Radiology

## 2020-06-24 ENCOUNTER — Ambulatory Visit (HOSPITAL_COMMUNITY): Payer: Medicare Other | Admitting: Hematology

## 2020-06-24 DIAGNOSIS — I10 Essential (primary) hypertension: Secondary | ICD-10-CM

## 2020-06-24 DIAGNOSIS — N182 Chronic kidney disease, stage 2 (mild): Secondary | ICD-10-CM

## 2020-06-24 DIAGNOSIS — E1100 Type 2 diabetes mellitus with hyperosmolarity without nonketotic hyperglycemic-hyperosmolar coma (NKHHC): Secondary | ICD-10-CM | POA: Diagnosis present

## 2020-06-24 DIAGNOSIS — E1121 Type 2 diabetes mellitus with diabetic nephropathy: Secondary | ICD-10-CM

## 2020-06-24 DIAGNOSIS — E1122 Type 2 diabetes mellitus with diabetic chronic kidney disease: Secondary | ICD-10-CM

## 2020-06-24 DIAGNOSIS — E1165 Type 2 diabetes mellitus with hyperglycemia: Secondary | ICD-10-CM

## 2020-06-24 LAB — CBC WITH DIFFERENTIAL/PLATELET
Abs Immature Granulocytes: 0.07 10*3/uL (ref 0.00–0.07)
Basophils Absolute: 0 10*3/uL (ref 0.0–0.1)
Basophils Relative: 0 %
Eosinophils Absolute: 0.3 10*3/uL (ref 0.0–0.5)
Eosinophils Relative: 2 %
HCT: 32.5 % — ABNORMAL LOW (ref 39.0–52.0)
Hemoglobin: 10.7 g/dL — ABNORMAL LOW (ref 13.0–17.0)
Immature Granulocytes: 0 %
Lymphocytes Relative: 11 %
Lymphs Abs: 1.7 10*3/uL (ref 0.7–4.0)
MCH: 27.9 pg (ref 26.0–34.0)
MCHC: 32.9 g/dL (ref 30.0–36.0)
MCV: 84.9 fL (ref 80.0–100.0)
Monocytes Absolute: 1.4 10*3/uL — ABNORMAL HIGH (ref 0.1–1.0)
Monocytes Relative: 9 %
Neutro Abs: 12.4 10*3/uL — ABNORMAL HIGH (ref 1.7–7.7)
Neutrophils Relative %: 78 %
Platelets: 383 10*3/uL (ref 150–400)
RBC: 3.83 MIL/uL — ABNORMAL LOW (ref 4.22–5.81)
RDW: 14.3 % (ref 11.5–15.5)
WBC: 15.8 10*3/uL — ABNORMAL HIGH (ref 4.0–10.5)
nRBC: 0 % (ref 0.0–0.2)

## 2020-06-24 LAB — COMPREHENSIVE METABOLIC PANEL
ALT: 18 U/L (ref 0–44)
AST: 18 U/L (ref 15–41)
Albumin: 2.9 g/dL — ABNORMAL LOW (ref 3.5–5.0)
Alkaline Phosphatase: 201 U/L — ABNORMAL HIGH (ref 38–126)
Anion gap: 12 (ref 5–15)
BUN: 30 mg/dL — ABNORMAL HIGH (ref 8–23)
CO2: 29 mmol/L (ref 22–32)
Calcium: 11 mg/dL — ABNORMAL HIGH (ref 8.9–10.3)
Chloride: 96 mmol/L — ABNORMAL LOW (ref 98–111)
Creatinine, Ser: 1.28 mg/dL — ABNORMAL HIGH (ref 0.61–1.24)
GFR calc Af Amer: >60 mL/min (ref >60)
GFR calc non Af Amer: 57 mL/min — ABNORMAL LOW (ref >60)
Glucose, Bld: 171 mg/dL — ABNORMAL HIGH (ref 70–99)
Potassium: 3.2 mmol/L — ABNORMAL LOW (ref 3.5–5.1)
Sodium: 137 mmol/L (ref 135–145)
Total Bilirubin: 0.8 mg/dL (ref 0.3–1.2)
Total Protein: 6.9 g/dL (ref 6.5–8.1)

## 2020-06-24 LAB — BASIC METABOLIC PANEL
Anion gap: 14 (ref 5–15)
BUN: 33 mg/dL — ABNORMAL HIGH (ref 8–23)
CO2: 26 mmol/L (ref 22–32)
Calcium: 10.7 mg/dL — ABNORMAL HIGH (ref 8.9–10.3)
Chloride: 90 mmol/L — ABNORMAL LOW (ref 98–111)
Creatinine, Ser: 1.5 mg/dL — ABNORMAL HIGH (ref 0.61–1.24)
GFR calc Af Amer: 55 mL/min — ABNORMAL LOW (ref >60)
GFR calc non Af Amer: 47 mL/min — ABNORMAL LOW (ref >60)
Glucose, Bld: 615 mg/dL (ref 70–99)
Potassium: 3.3 mmol/L — ABNORMAL LOW (ref 3.5–5.1)
Sodium: 130 mmol/L — ABNORMAL LOW (ref 135–145)

## 2020-06-24 LAB — CBG MONITORING, ED
Glucose-Capillary: 157 mg/dL — ABNORMAL HIGH (ref 70–99)
Glucose-Capillary: 178 mg/dL — ABNORMAL HIGH (ref 70–99)
Glucose-Capillary: 181 mg/dL — ABNORMAL HIGH (ref 70–99)
Glucose-Capillary: 181 mg/dL — ABNORMAL HIGH (ref 70–99)
Glucose-Capillary: 193 mg/dL — ABNORMAL HIGH (ref 70–99)
Glucose-Capillary: 195 mg/dL — ABNORMAL HIGH (ref 70–99)
Glucose-Capillary: 243 mg/dL — ABNORMAL HIGH (ref 70–99)
Glucose-Capillary: 297 mg/dL — ABNORMAL HIGH (ref 70–99)
Glucose-Capillary: 330 mg/dL — ABNORMAL HIGH (ref 70–99)
Glucose-Capillary: 371 mg/dL — ABNORMAL HIGH (ref 70–99)
Glucose-Capillary: 431 mg/dL — ABNORMAL HIGH (ref 70–99)
Glucose-Capillary: 459 mg/dL — ABNORMAL HIGH (ref 70–99)
Glucose-Capillary: 539 mg/dL (ref 70–99)

## 2020-06-24 LAB — HIV ANTIBODY (ROUTINE TESTING W REFLEX): HIV Screen 4th Generation wRfx: NONREACTIVE

## 2020-06-24 LAB — GLUCOSE, CAPILLARY: Glucose-Capillary: 266 mg/dL — ABNORMAL HIGH (ref 70–99)

## 2020-06-24 LAB — MAGNESIUM: Magnesium: 2 mg/dL (ref 1.7–2.4)

## 2020-06-24 MED ORDER — INSULIN ASPART 100 UNIT/ML ~~LOC~~ SOLN
0.0000 [IU] | Freq: Every day | SUBCUTANEOUS | Status: DC
  Administered 2020-06-24: 3 [IU] via SUBCUTANEOUS
  Administered 2020-06-25: 4 [IU] via SUBCUTANEOUS
  Administered 2020-06-26: 2 [IU] via SUBCUTANEOUS
  Administered 2020-06-27 – 2020-06-29 (×2): 4 [IU] via SUBCUTANEOUS

## 2020-06-24 MED ORDER — INSULIN DETEMIR 100 UNIT/ML ~~LOC~~ SOLN
20.0000 [IU] | Freq: Two times a day (BID) | SUBCUTANEOUS | Status: DC
  Administered 2020-06-24 – 2020-06-26 (×4): 20 [IU] via SUBCUTANEOUS
  Filled 2020-06-24 (×7): qty 0.2

## 2020-06-24 MED ORDER — INSULIN ASPART 100 UNIT/ML ~~LOC~~ SOLN
0.0000 [IU] | Freq: Three times a day (TID) | SUBCUTANEOUS | Status: DC
  Administered 2020-06-24: 11 [IU] via SUBCUTANEOUS
  Administered 2020-06-25 (×2): 15 [IU] via SUBCUTANEOUS
  Administered 2020-06-25: 11 [IU] via SUBCUTANEOUS
  Administered 2020-06-26 (×2): 7 [IU] via SUBCUTANEOUS
  Administered 2020-06-26 – 2020-06-27 (×3): 11 [IU] via SUBCUTANEOUS
  Administered 2020-06-27: 7 [IU] via SUBCUTANEOUS
  Administered 2020-06-28: 15 [IU] via SUBCUTANEOUS
  Administered 2020-06-28: 20 [IU] via SUBCUTANEOUS
  Administered 2020-06-28: 15 [IU] via SUBCUTANEOUS
  Filled 2020-06-24: qty 1

## 2020-06-24 MED ORDER — PANTOPRAZOLE SODIUM 40 MG PO TBEC
40.0000 mg | DELAYED_RELEASE_TABLET | Freq: Every day | ORAL | Status: DC
  Administered 2020-06-24 – 2020-06-28 (×5): 40 mg via ORAL
  Filled 2020-06-24 (×5): qty 1

## 2020-06-24 MED ORDER — ENSURE ENLIVE PO LIQD
237.0000 mL | Freq: Two times a day (BID) | ORAL | Status: DC
  Administered 2020-06-25 – 2020-06-28 (×4): 237 mL via ORAL

## 2020-06-24 NOTE — ED Notes (Signed)
Date and time results received: 06/24/20 0049   Test: Glucose Critical Value: 615  Name of Provider Notified: Zierle-Ghosh, [place] DO

## 2020-06-24 NOTE — Progress Notes (Signed)
Inpatient Diabetes Program Recommendations  AACE/ADA: New Consensus Statement on Inpatient Glycemic Control (2015)  Target Ranges:  Prepandial:   less than 140 mg/dL      Peak postprandial:   less than 180 mg/dL (1-2 hours)      Critically ill patients:  140 - 180 mg/dL   Lab Results  Component Value Date   GLUCAP 195 (H) 06/24/2020   HGBA1C 9.2 (A) 04/16/2020    Review of Glycemic Control  Diabetes history:  DM2 Outpatient Diabetes medications:  U500 80 units with breakfast and lunch and 60 units with dinner Glipizide 5 mg bid Ozempic 0.5 weekly Current orders for Inpatient glycemic control:  Novlog 0-20 units tid Novolog 0-5 units qhs Levemir 20 units bid  Note:  Spoke with patient's wife who is at bedside over the phone.  Reviewed patient's current A1c of 9.2% (average blood sugar of 217 mg/dL). Explained what a A1c is and what it measures. Also reviewed goal A1c with patient, importance of good glucose control @ home, and blood sugar goals. She states he stopped taking his insulin and DM medications 2 weeks ago due to decreased appetite.  He has recently been diagnosed with metastatic lung cancer and will start treatment soon.  Educated wife on importance of taking insulin even if he is not eating.  Encouraged her to keep in close contact with Dr. Dorris Fetch for adjustments of insulin with cancer treatment starting soon and poor po intake.   He sees Dr. Dorris Fetch every 3 mos.  He checks his blood sugar at least 3 x a day.  Denies difficulties obtaining insulins and medications.  Reviewed hypoglycemia, signs, symptoms and treatment.  Will continue to follow while inpatient.  Thank you, Reche Dixon, RN, BSN Diabetes Coordinator Inpatient Diabetes Program [phone] (team pager from 8a-5p)

## 2020-06-24 NOTE — Progress Notes (Signed)
PROGRESS NOTE    Kenneth Patrick  TIR:[id number] DOB: [birth year] DOA: 06/23/2020 PCP: [name], MD    Chief Complaint  Patient presents with  . Hyperglycemia    Brief Narrative:  As per H&P written by Dr. Clearence Ped on 06/23/20  Kenneth Patrick  is a 69 y.o. male, with a history of sleep apnea, HLD, DMII, COPD, and cancer who presents to the ED with a chief complaint of  Hyperglycemia. Patient is supposed to be on 60units of insulin with supper. He reports that he hasn't taken it in some time because he has not been eating. His wife reports that yesterday he started experiencing polydipsia. He drank some water, but mostly regular gatorade, and dr. Malachi Bonds. Patient reports that he hasn't been checking his glucose. His wife checked it today and the meter read high, so they came to the ER. Patient reports not taking his insulin because he has not been eating. Last normal meal was last week, and wife believes he only had one meal total last week. Patient reports that he suspected his diet was not good for DMII, so he just stopped eating.  In addition to this, he has been having increasing dyspnea over the last 2 weeks. He has had associated weakness. He hasn't been able to walk for past two days. Wife has been pushing him in an office chair to the bathroom. He has been incontinent since yesterday, with polyuria that is likely due to the hyperglycemia. He has had a productive cough as well, with yellow sputum. No hemoptysis. He has had no fevers, no sick contacts. Patient is not vaccinated for covid, he reports that he "doesn't trust it."   Of note, patient has had increasing back pain over a couple of months. Patient reports that he had imaging done that showed likely neoplasm, and he has been following with Dr. Raliegh Ip. He is supposed to have a biopsy on 06/25/20. For this reason, he will be admitted to [facility] long.  ED course T 98.8, P 95, R 33, BP 148/57 PH 7.399, CO2 25, Gap 15, Glucose 895 WBC  10.7 BUN/Cr 35/1.66 CTA =  No PE. Primary lung cancer with mets.  Albuterol, aspirin, LR 11ml, famotidine 20mg , insulin drip BNP 39   Assessment & Plan: 1-Respiratory failure with hypoxia (HCC) -requiring 3L [place] -appears to be mild COPD exacerbation and lung cancer -no fever -good O2 sat on 3L [place] -patient obesity and most likely OSA component playing role -ABG with normal CO2 level -Continue weaning oxygen supplementation as tolerated -Patient with plan for lung biopsy on 06/25/2020 -Continue Dulera, Incruse Ellipta, DuoNeb nebulizer, mucolytic's and the use of flutter valve. -Currently no wheezing; holding oral steroids while actively treating hyperosmolar state.  2-Hyperosmolar hyperglycemic state (HHS) ([place]) -No DKA -After insulin drip usage, patient blood sugar less than 200 for 4 different occasions -Continue normal saline -Advance diet and transition to long-acting and sliding scale insulin. -Follow CBGs and adjust hypoglycemic regimen as required.  3-GERD -Continue PPI.  4-HLD -continue statins  5-diabetic neuropathy -The use of Neurontin  6-essential hypertension -Continue the use of Aldactone and Cardizem  7-morbid obesity -Body mass index is 43.66 kg/m. -Low calorie diet and portion control discussed with patient.  8-depression of anxiety -Continue the use of Zoloft, as needed Xanax -No suicidal ideation hallucination -Stable mood currently.  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  10-metastatic lung cance -planning for biopsy on 06/25/20 -continue outpatient follow up with oncology service.   DVT prophylaxis: heparin Code Status: Full code. Family Communication: no family at bedside.  Disposition:   Status is: Inpatient   Dispo: The patient is from: home              Anticipated d/c is to: home                Anticipated d/c date is: to be determined.               Patient currently is not medically stable for discharge at this point. Still adjusting insulin therapy and closely following electrolytes. Stabilizing resp status and with pending lung biopsy.    Consultants:   None    Procedures:  -See below for x-ray reports -lung biopsy planned for 06/25/20   Antimicrobials:  None    Subjective: Afebrile, no chest pain, no nausea vomiting currently.  Still having appreciated tachypnea requiring oxygen supplementation.  Objective: Vitals:   06/24/20 0525 06/24/20 0530 06/24/20 0635 06/24/20 0815  BP: 122/69 (!) 146/70 (!) 144/78   Pulse: 87 84 90   Resp: 18 15 (!) 21   Temp:      TempSrc:      SpO2: 95% 93% (!) 89% 92%  Weight:      Height:        Intake/Output Summary (Last 24 hours) at 06/24/2020 1230 Last data filed at 06/23/2020 1744 Gross per 24 hour  Intake 44.27 ml  Output --  Net 44.27 ml   Filed Weights   06/23/20 1513  Weight: (!) 162.7 kg    Examination:  General exam: Afebrile, no chest pain, no nausea, no vomiting. Respiratory system: Positive rhonchi bilaterally; decreased breath sounds at the bases.  No frank wheezing.  No using accessory muscles.  Positive tachypnea Cardiovascular system: S1 & S2 heard, RRR. No JVD, murmurs, rubs, gallops or clicks.  Unable to properly assess JVD with body habitus.  1+ edema bilaterally. Gastrointestinal system: Abdomen is obese, nondistended, soft and nontender. No organomegaly or masses felt. Normal bowel sounds heard. Central nervous system: Alert and oriented. No focal neurological deficits. Extremities: No cyanosis or clubbing. Skin: No rashes, no petechiae. Psychiatry: Judgement and insight appear normal. Mood & affect appropriate.     Data Reviewed: I have personally reviewed following labs and imaging studies  CBC: Recent Labs  Lab 06/23/20 1540 06/24/20 0439  WBC 10.7* 15.8*  NEUTROABS 9.3*  12.4*  HGB 10.7* 10.7*  HCT 34.1* 32.5*  MCV 90.2 84.9  PLT 348 474    Basic Metabolic Panel: Recent Labs  Lab 06/23/20 1540 06/23/20 2322 06/24/20 0439  NA 122* 130* 137  K 4.7 3.3* 3.2*  CL 82* 90* 96*  CO2 25 26 29   GLUCOSE 895* 615* 171*  BUN 35* 33* 30*  CREATININE 1.66* 1.50* 1.28*  CALCIUM 10.3 10.7* 11.0*  MG  --   --  2.0    GFR: Estimated Creatinine Clearance: 91.6 mL/min (A) (by C-G formula based on SCr of 1.28 mg/dL (H)).  Liver Function Tests: Recent Labs  Lab 06/23/20 1540 06/24/20 0439  AST 12* 18  ALT 19 18  ALKPHOS 225* 201*  BILITOT 1.2 0.8  PROT 7.3 6.9  ALBUMIN 3.2* 2.9*    CBG: Recent Labs  Lab 06/24/20 0421 06/24/20 0521 06/24/20 0633 06/24/20 0800 06/24/20 0923  GLUCAP 243* 193* 157* 178* 195*     Recent Results (from the  past 240 hour(s))  SARS Coronavirus 2 by RT PCR (hospital order, performed in [facility] hospital lab) Nasopharyngeal Nasopharyngeal Swab     Status: None   Collection Time: 06/23/20  3:54 PM   Specimen: Nasopharyngeal Swab  Result Value Ref Range Status   SARS Coronavirus 2 NEGATIVE NEGATIVE Final    Comment: (NOTE) SARS-CoV-2 target nucleic acids are NOT DETECTED.  The SARS-CoV-2 RNA is generally detectable in upper and lower respiratory specimens during the acute phase of infection. The lowest concentration of SARS-CoV-2 viral copies this assay can detect is 250 copies / mL. A negative result does not preclude SARS-CoV-2 infection and should not be used as the sole basis for treatment or other patient management decisions.  A negative result may occur with improper specimen collection / handling, submission of specimen other than nasopharyngeal swab, presence of viral mutation(s) within the areas targeted by this assay, and inadequate number of viral copies (<250 copies / mL). A negative result must be combined with clinical observations, patient history, and epidemiological information.  Fact Sheet  for Patients:   StrictlyIdeas.no  Fact Sheet for Healthcare Providers: BankingDealers.co.za  This test is not yet approved or  cleared by the [place] FDA and has been authorized for detection and/or diagnosis of SARS-CoV-2 by FDA under an Emergency Use Authorization (EUA).  This EUA will remain in effect (meaning this test can be used) for the duration of the COVID-19 declaration under Section 564(b)(1) of the Act, 21 U.S.C. section 360bbb-3(b)(1), unless the authorization is terminated or revoked sooner.  Performed at [facility], [address], [place], [place] [address]          Radiology Studies: CT Angio Chest PE W and/or Wo Contrast  Result Date: 06/23/2020 CLINICAL DATA:  Shortness of breath and cough x2 weeks. EXAM: CT ANGIOGRAPHY CHEST WITH CONTRAST TECHNIQUE: Multidetector CT imaging of the chest was performed using the standard protocol during bolus administration of intravenous contrast. Multiplanar CT image reconstructions and MIPs were obtained to evaluate the vascular anatomy. CONTRAST:  47mL OMNIPAQUE IOHEXOL 350 MG/ML SOLN COMPARISON:  April 16, 2017 FINDINGS: Cardiovascular: Satisfactory opacification of the pulmonary arteries to the segmental level. No evidence of pulmonary embolism. Mild artifact is seen overlying the pulmonary arteries within the perihilar region on the left. Normal heart size. No pericardial effusion. Mediastinum/Nodes: No enlarged mediastinal, hilar, or axillary lymph nodes. Thyroid gland, trachea, and esophagus demonstrate no significant findings. Lungs/Pleura: Mild atelectasis and/or early infiltrate is seen within the left lung base. A 4.9 cm x 3.2 cm heterogeneous low-attenuation mass is seen within the left apex. Associated areas of cortical destruction are seen involving the first and second left ribs, as well as the adjacent portions of the C7 and T1 vertebral bodies. A 2.6 cm x 1.4 cm soft  tissue mass is seen along the anterolateral aspect of the right chest wall. This originates from the fifth right rib. There is no evidence of a pleural effusion or pneumothorax. Upper Abdomen: A 4.9 cm x 2.9 cm area of heterogeneous low attenuation is seen along the medial aspect of the liver dome. Surgical clips are noted within the gallbladder fossa. 1.5 cm 1.9 cm lymph nodes are seen along the posteromedial aspect of the inferior vena cava. Musculoskeletal: A 4.3 cm x 2.4 cm soft tissue mass is seen adjacent to the posterior aspect of the proximal left clavicle (axial CT images 1 through 17, CT series number 5). A 1.3 cm x 1.0 cm mildly expansile lytic  lesion is seen within the posterior aspect of the fifth left rib. Similar appearing lesions are seen within the posteromedial aspect of the tenth left rib, anterolateral aspect of the seventh left rib, posterior seventh right rib, posterolateral aspect of the eighth right rib and lateral fourth right rib. A nondisplaced 12 left rib fracture is present. A chronic posterior eighth right rib fracture is seen. Multilevel degenerative changes seen throughout the thoracic spine. Multiple ill-defined lytic lesions are noted throughout multiple thoracic spine vertebral bodies. Review of the MIP images confirms the above findings. IMPRESSION: 1. No evidence of pulmonary embolism. 2. Heterogeneous left apical soft tissue mass with associated areas of cortical destruction involving the first and second left ribs, as well as the adjacent portions of the C7 and T1 vertebral bodies. This is consistent with primary lung malignancy. 3. 2.6 cm x 1.4 cm soft tissue mass along the anterolateral aspect of the right chest wall, which originates from the fifth right rib. This is concerning for metastatic disease. 4. Multiple ill-defined lytic lesions throughout multiple thoracic spine vertebral bodies and multiple ribs, consistent with osseous metastasis. 5. 4.9 cm x 2.9 cm area of  heterogeneous low attenuation along the medial aspect of the liver dome. This is suspicious for a metastatic lesion. 6. Mild atelectasis and/or early infiltrate within the left lung base. 7. Evidence of prior cholecystectomy. Electronically Signed   By: Virgina Norfolk M.D.   On: 06/23/2020 19:35   DG Chest Portable 1 View  Result Date: 06/23/2020 CLINICAL DATA:  Hypoxia with shortness of breath and cough 2 weeks. History of lung cancer. EXAM: PORTABLE CHEST 1 VIEW COMPARISON:  04/30/2020 and PET-CT 06/13/2020 FINDINGS: Lungs are adequately inflated demonstrate worsening pleural base mass over the lateral right mid to lower thorax. Right eighth posterior rib fracture which may be pathologic. Lungs are adequately inflated without acute airspace process or effusion. Cardiomediastinal silhouette is normal. Suggestion of lytic process involving the right posterior seventh rib. IMPRESSION: 1. No acute airspace process. 2. Worsening pleural based mass over the lateral right mid to lower thorax. Right eighth posterior rib fracture which may be pathologic. Possible lytic process involving the right posterior seventh rib. These findings are likely due to metastatic disease. Electronically Signed   By: Marin Olp M.D.   On: 06/23/2020 16:25        Scheduled Meds: . atorvastatin  80 mg Oral QHS  . diltiazem  240 mg Oral Daily  . gabapentin  300 mg Oral BID  . heparin  5,000 Units Subcutaneous Q8H  . insulin aspart  0-20 Units Subcutaneous TID WC  . insulin aspart  0-5 Units Subcutaneous QHS  . insulin detemir  20 Units Subcutaneous BID  . mometasone-formoterol  2 puff Inhalation BID   And  . umeclidinium bromide  1 puff Inhalation Daily  . sertraline  200 mg Oral Daily  . spironolactone  25 mg Oral Daily   Continuous Infusions: . sodium chloride Stopped (06/24/20 0426)  . insulin 4.4 Units/hr (06/24/20 1108)     LOS: 1 day    Time spent: 35 minutes    Barton Dubois, MD [facility]   To contact the attending provider between 7A-7P or the covering provider during after hours 7P-7A, please log into the web site [email or web address] and access using universal [place] password for that web site. If you do not have the password, please call the hospital operator.  06/24/2020, 12:30 PM

## 2020-06-25 ENCOUNTER — Inpatient Hospital Stay (HOSPITAL_COMMUNITY): Payer: Medicare Other

## 2020-06-25 ENCOUNTER — Ambulatory Visit (HOSPITAL_COMMUNITY): Payer: Medicare Other

## 2020-06-25 ENCOUNTER — Other Ambulatory Visit: Payer: Self-pay

## 2020-06-25 DIAGNOSIS — R0602 Shortness of breath: Secondary | ICD-10-CM

## 2020-06-25 DIAGNOSIS — R9431 Abnormal electrocardiogram [ECG] [EKG]: Secondary | ICD-10-CM

## 2020-06-25 DIAGNOSIS — R0902 Hypoxemia: Secondary | ICD-10-CM

## 2020-06-25 DIAGNOSIS — R739 Hyperglycemia, unspecified: Secondary | ICD-10-CM

## 2020-06-25 LAB — ECHOCARDIOGRAM COMPLETE
Area-P 1/2: 4.15 cm2
Height: 76 in
Weight: 5739.01 oz

## 2020-06-25 LAB — BLOOD GAS, ARTERIAL
Acid-Base Excess: 5.2 mmol/L — ABNORMAL HIGH (ref 0.0–2.0)
Bicarbonate: 30.1 mmol/L — ABNORMAL HIGH (ref 20.0–28.0)
Drawn by: 25770
FIO2: 36
O2 Content: 4 L/min
O2 Saturation: 91.8 %
Patient temperature: 98.6
pCO2 arterial: 48.2 mmHg — ABNORMAL HIGH (ref 32.0–48.0)
pH, Arterial: 7.412 (ref 7.350–7.450)
pO2, Arterial: 70.1 mmHg — ABNORMAL LOW (ref 83.0–108.0)

## 2020-06-25 LAB — GLUCOSE, CAPILLARY
Glucose-Capillary: 336 mg/dL — ABNORMAL HIGH (ref 70–99)
Glucose-Capillary: 361 mg/dL — ABNORMAL HIGH (ref 70–99)
Glucose-Capillary: 314 mg/dL — ABNORMAL HIGH (ref 70–99)
Glucose-Capillary: 299 mg/dL — ABNORMAL HIGH (ref 70–99)
Glucose-Capillary: 322 mg/dL — ABNORMAL HIGH (ref 70–99)

## 2020-06-25 IMAGING — US US BIOPSY LYMPH NODE
1 series · 8 of 8 positions shown · non-contrast
Comparison: none

INDICATION: Lung mass.  Hypermetabolic enlarged left supraclavicular adenopathy

[Series 1: us biopsy lymph node · 8 of 8 slices shown]
[im 1/8]
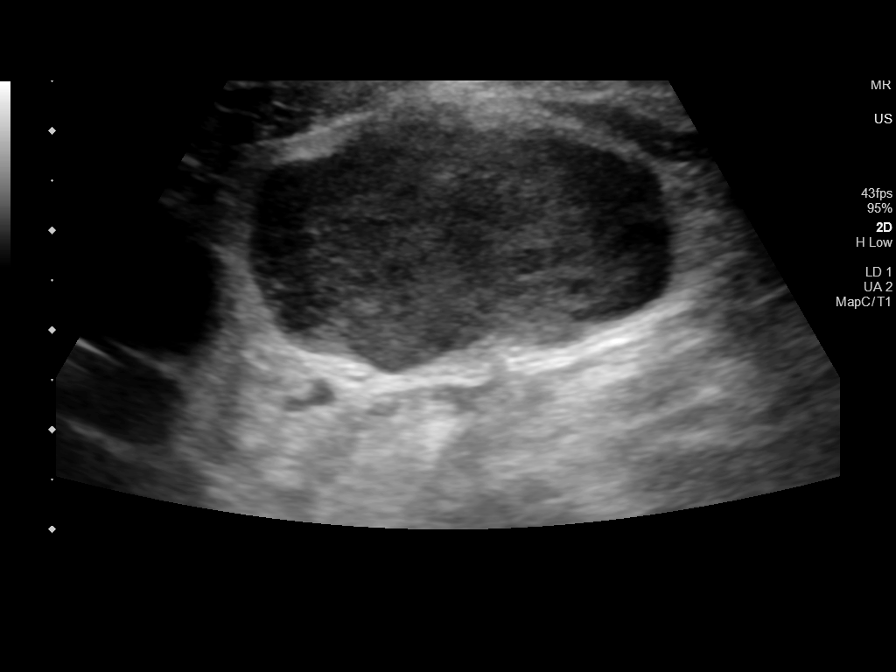
[im 2/8]
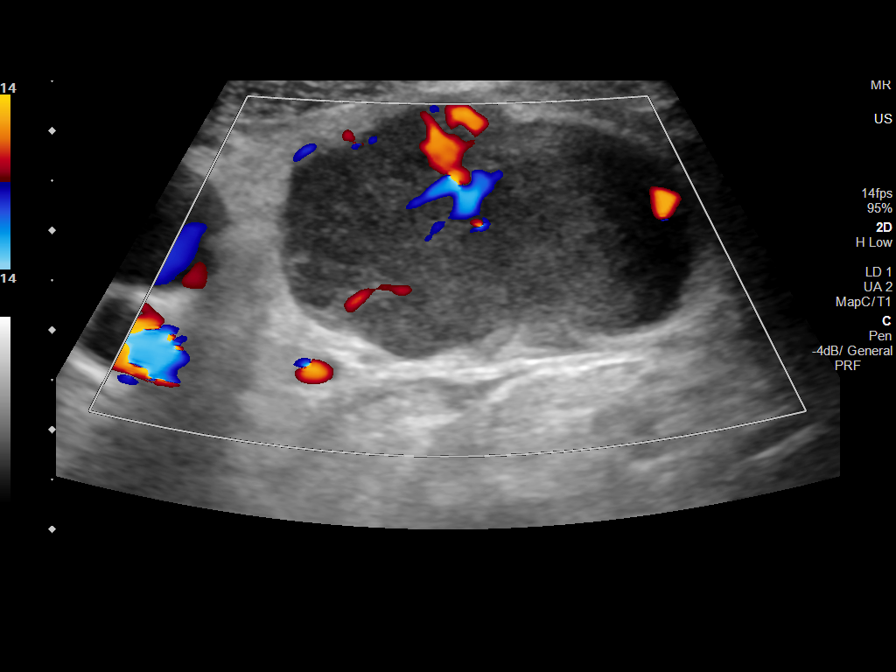
[im 3/8]
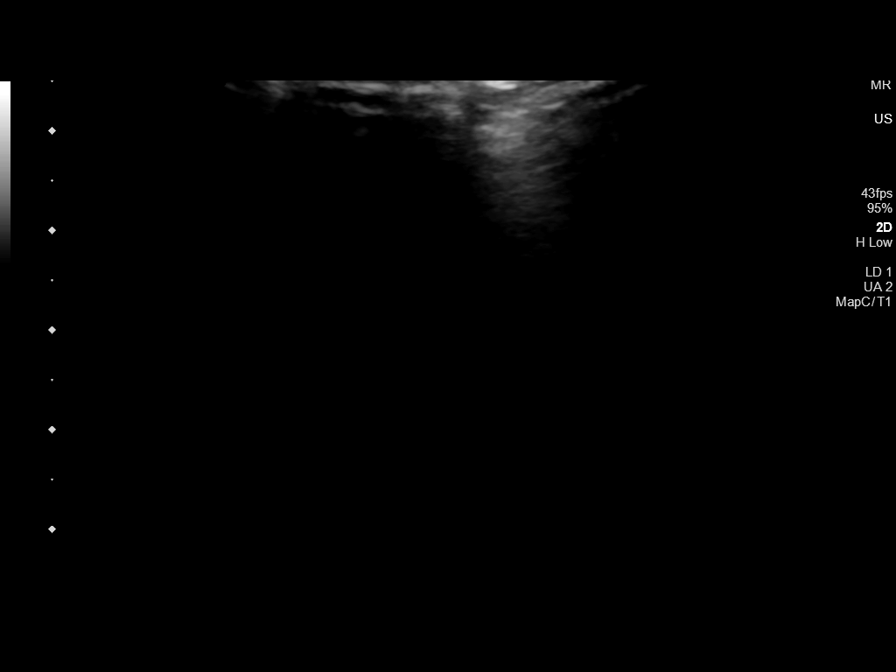
[im 4/8]
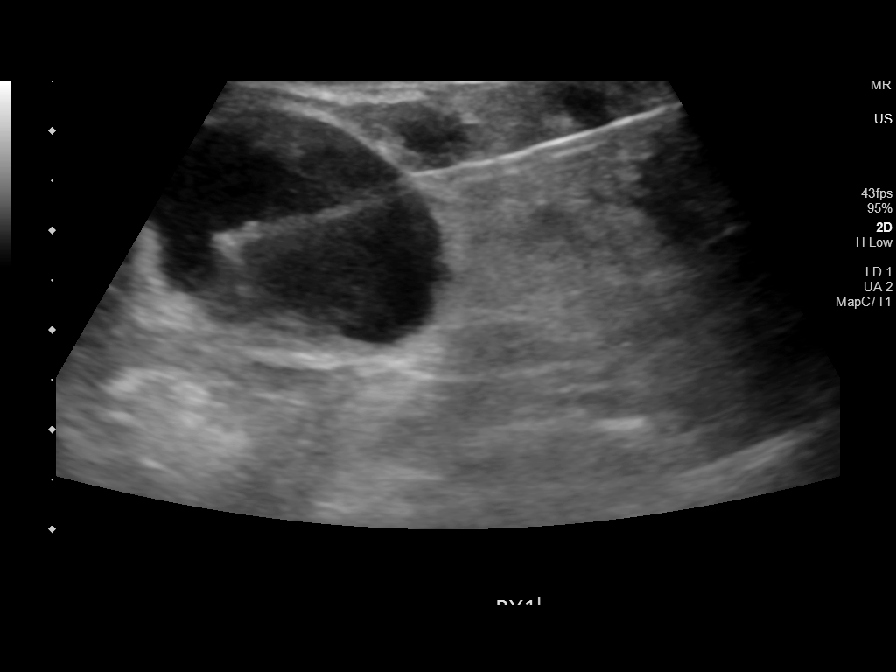
[im 5/8]
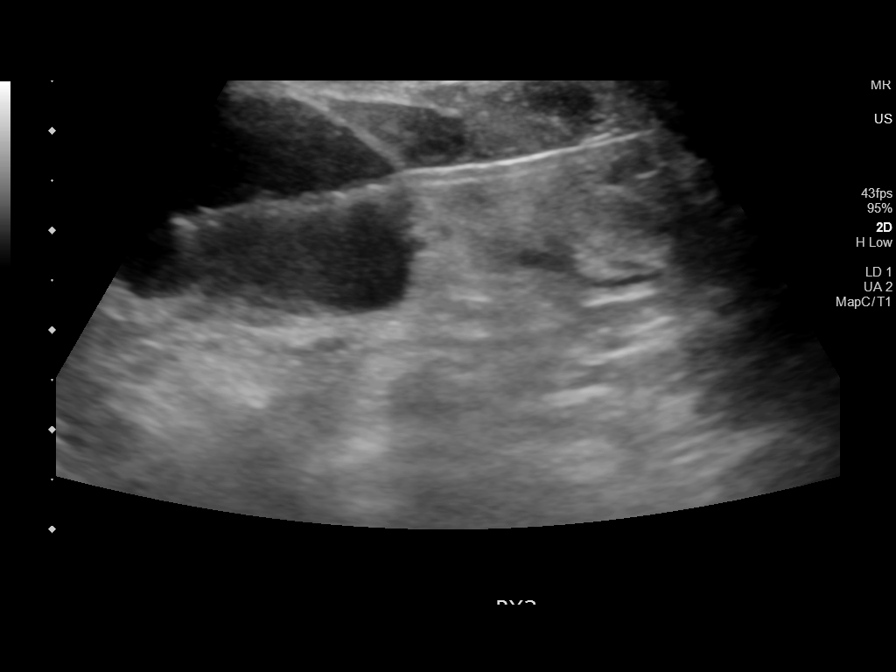
[im 6/8]
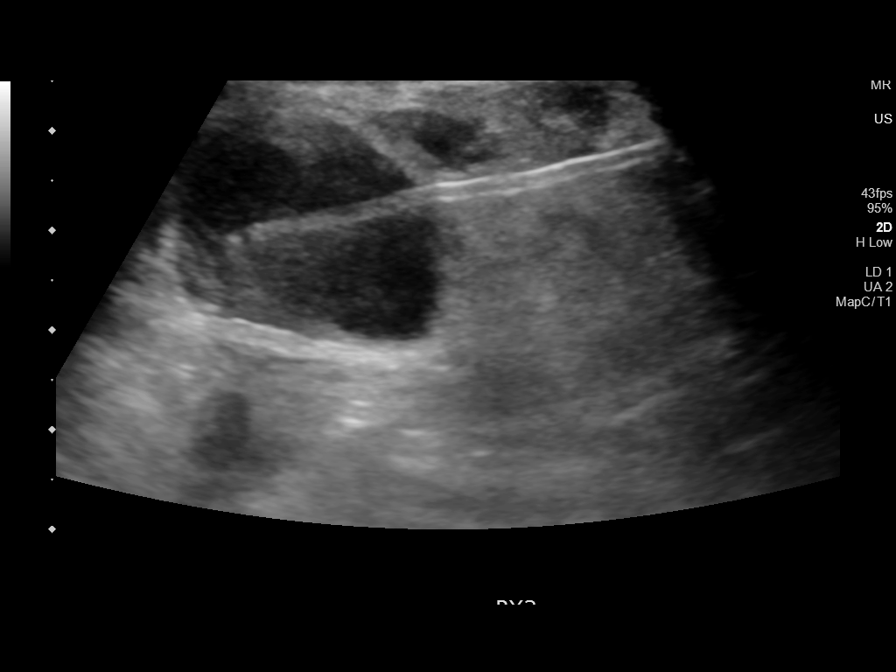
[im 7/8]
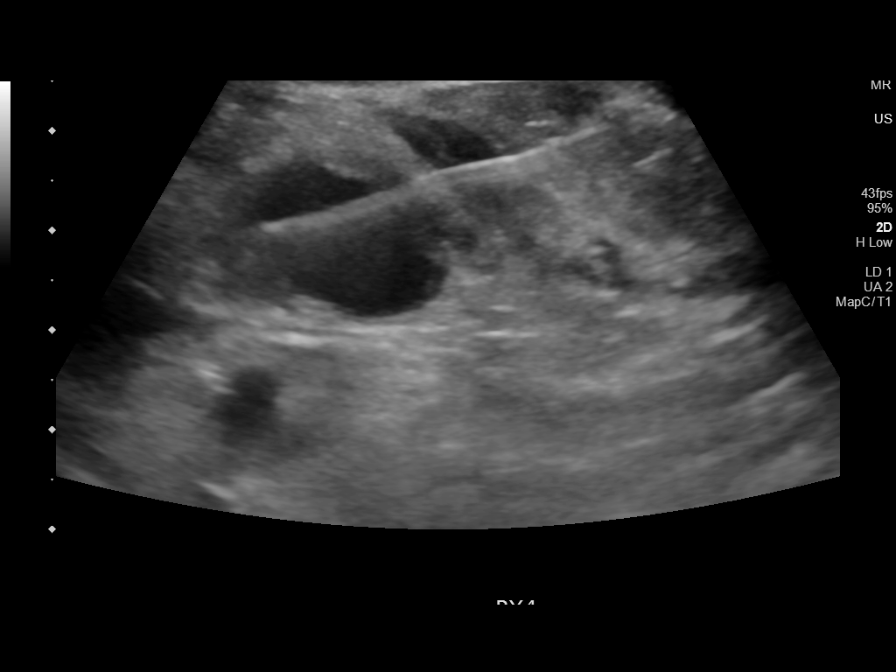
[im 8/8]
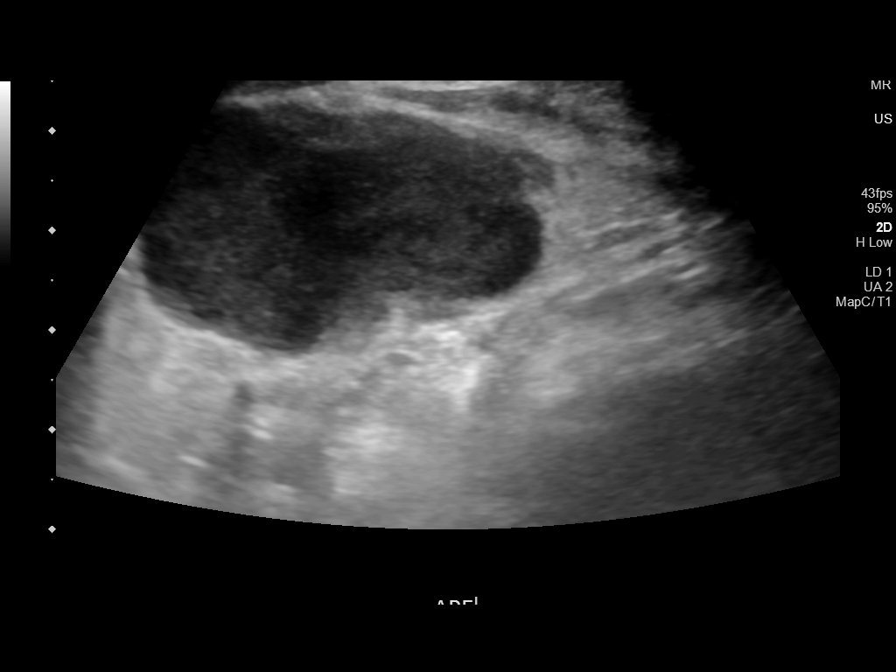

[8 of 8 positions shown; findings below may reference images not displayed]

EXAM:
ULTRASOUND GUIDED CORE BIOPSY OF LEFT SUPRACLAVICULAR ADENOPATHY

MEDICATIONS:
lidocaine 1% subcutaneous

ANESTHESIA/SEDATION:
None required

PROCEDURE:
The procedure, risks, benefits, and alternatives were explained to
the patient and spouse. Questions regarding the procedure were
encouraged and answered. The spouse understands and consents to the
procedure.

Survey ultrasound of the left supraclavicular region was performed.
Hypoechoic adenopathy was localized and an appropriate skin entry
site was determined and marked.

The operative field was prepped with chlorhexidine in a sterile
fashion, and a sterile drape was applied covering the operative
field. A sterile gown and sterile gloves were used for the
procedure. Local anesthesia was provided with 1% Lidocaine.

Under real-time ultrasound guidance, 4 18-gauge core biopsy samples
were obtained, submitted in formalin to surgical pathology. The
guide needle was removed. Postprocedure scans show no hemorrhage or
other apparent complication. The patient tolerated the procedure
well.

COMPLICATIONS:
None immediate.
FINDINGS: Left supraclavicular adenopathy was localized. Representative core
biopsy samples obtained as above.
IMPRESSION: 1. Technically successful left supraclavicular adenopathy core
biopsy under ultrasound guidance.

## 2020-06-25 IMAGING — MR MR LUMBAR SPINE WO/W CM
5 of 7 series · 27 of 48 positions shown · IV contrast (gadavist)
Comparison: Prior PET-CT from [DATE] and chest CT from
[DATE]

CLINICAL DATA: Initial evaluation for acute back pain, metastatic
disease.

EXAM:
MRI THORACIC AND LUMBAR SPINE WITHOUT AND WITH CONTRAST
TECHNIQUE: Multiplanar and multiecho pulse sequences of the thoracic and lumbar
spine were obtained without and with intravenous contrast.
CONTRAST:  10mL GADAVIST GADOBUTROL 1 MMOL/ML IV SOLN

[Series 21: T1 · sagittal · 4.0mm · 0.81mm/px · 5 of 17 slices shown (1 of 2)]
[im 1/17]
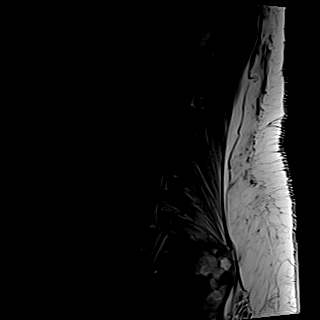
[im 5/17]
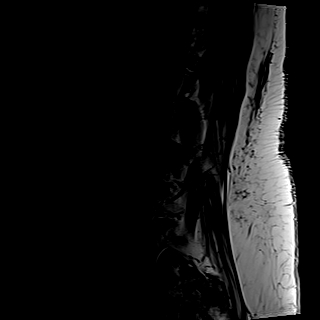
[im 9/17]
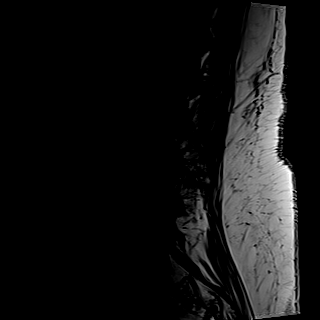
[im 13/17]
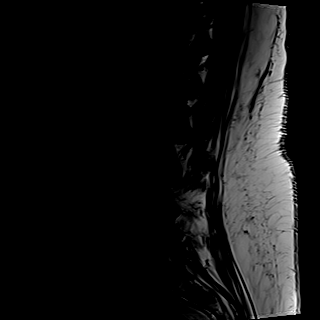
[im 17/17]
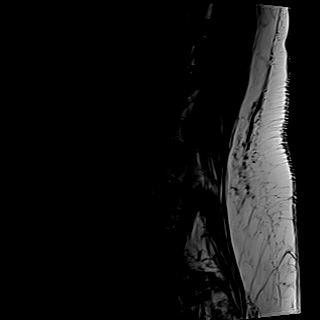

[Series 23: T2 · axial · 4.0mm · 0.62mm/px · z∈[-473,-276]mm · 8 of 38 slices shown]
[im 1/38]
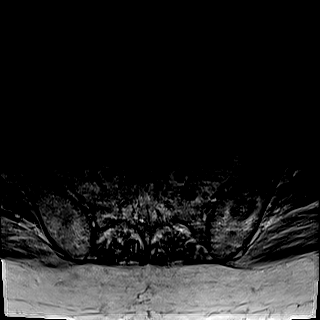
[im 5/38]
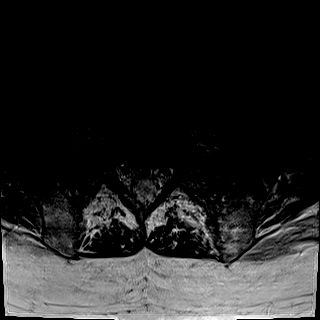
[im 13/38]
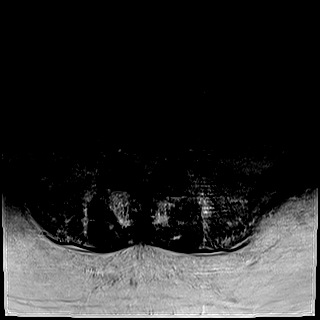
[im 17/38]
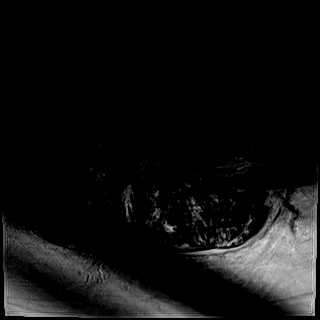
[im 21/38]
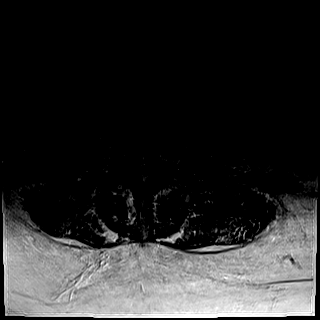
[im 25/38]
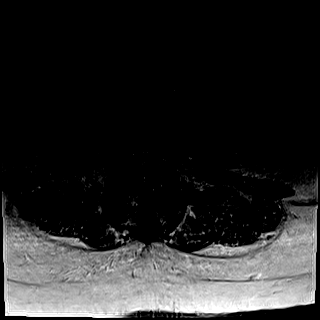
[im 33/38]
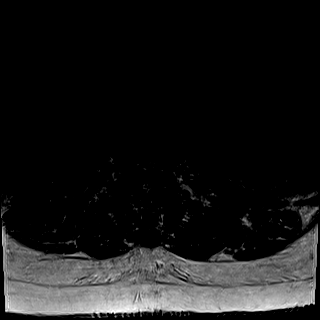
[im 38/38]
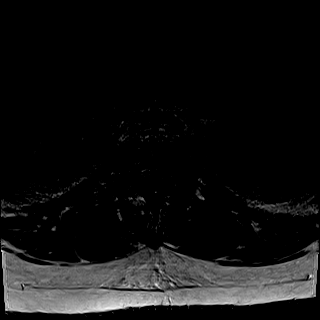

[Series 24: T1 · axial · 4.0mm · 0.39mm/px · z∈[-473,-276]mm · 8 of 38 slices shown (2 of 2)]
[im 1/38]
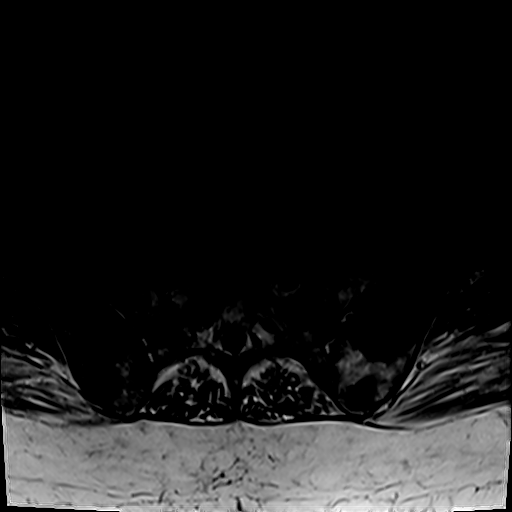
[im 5/38]
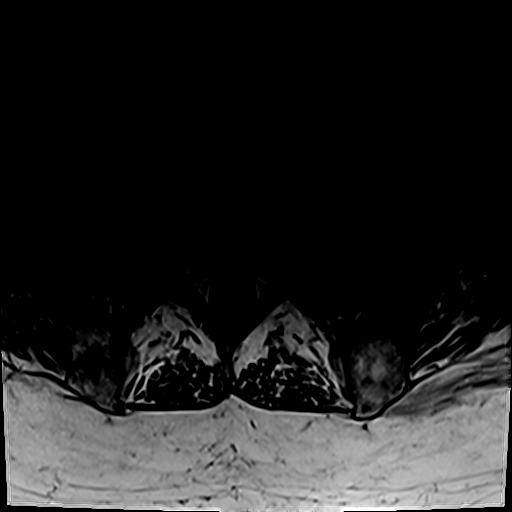
[im 13/38]
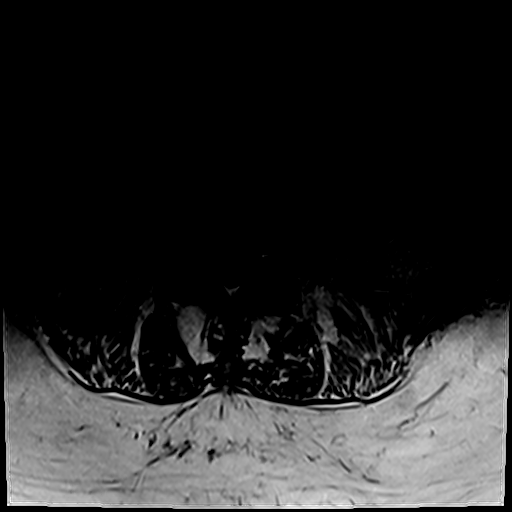
[im 17/38]
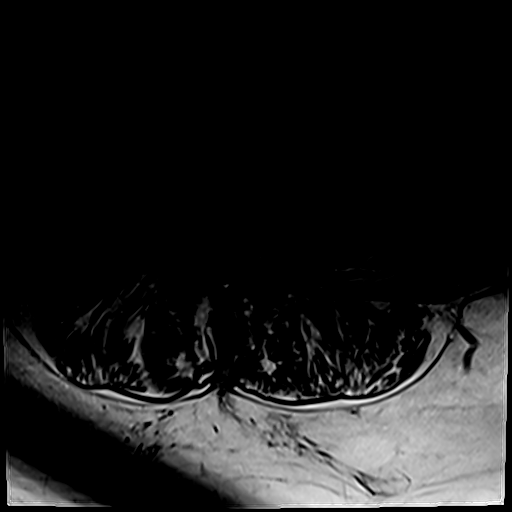
[im 21/38]
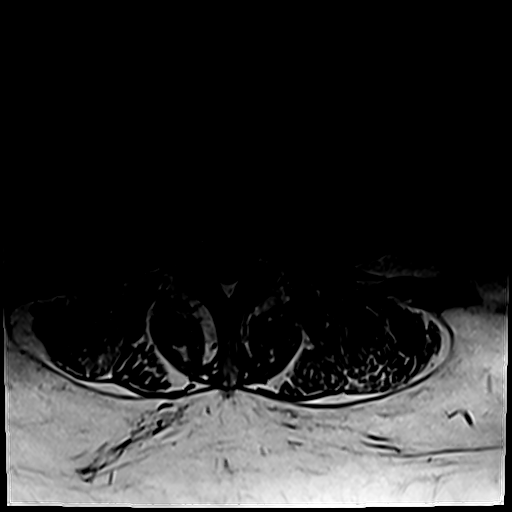
[im 25/38]
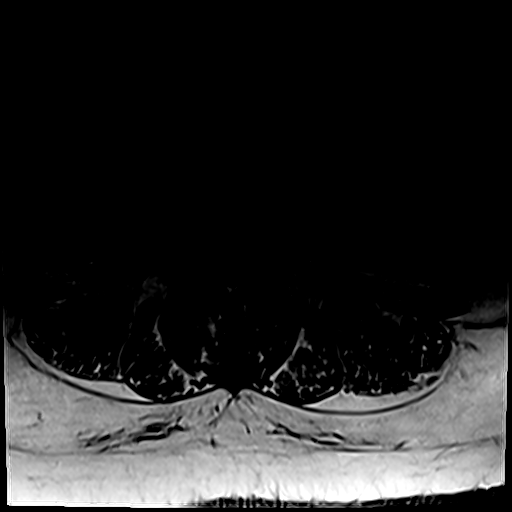
[im 33/38]
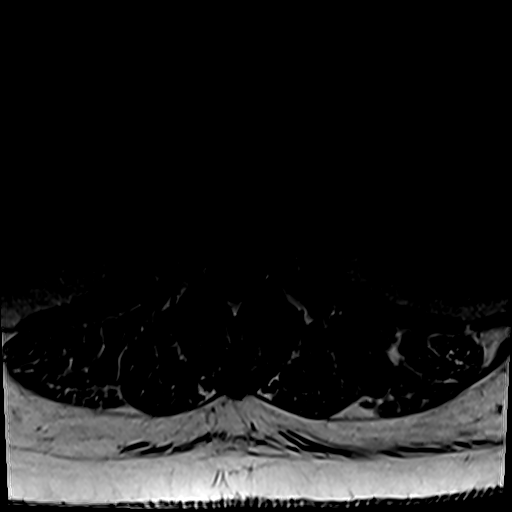
[im 38/38]
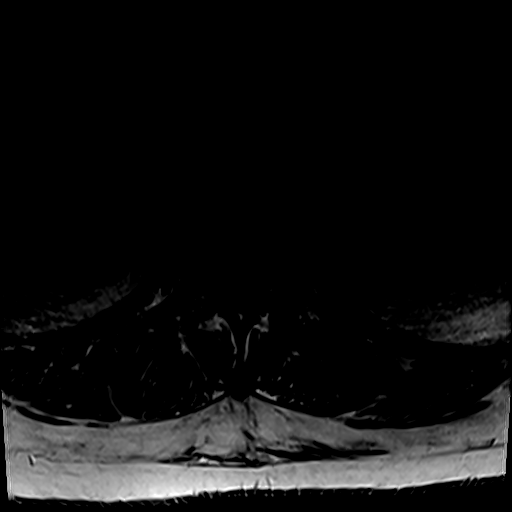

[Series 26: T2 post-contrast · sagittal · 4.0mm · 0.81mm/px · 4 of 17 slices shown]
[im 1/17]
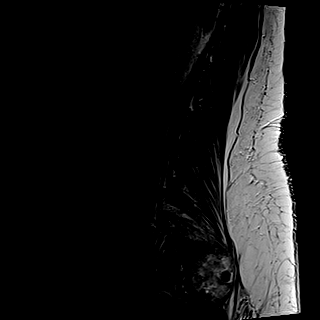
[im 6/17]
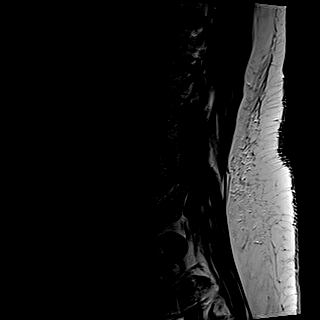
[im 11/17]
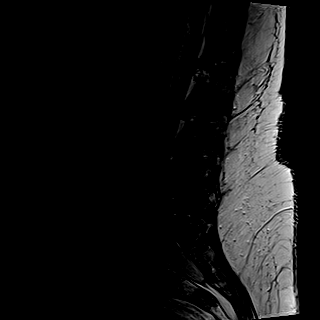
[im 17/17]
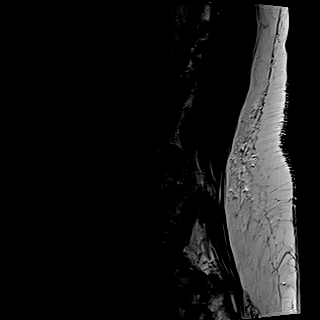

[Series 27: T1 fat-sat post-contrast · sagittal · 4.0mm · 0.81mm/px · 2 of 17 slices shown]
[im 1/17]
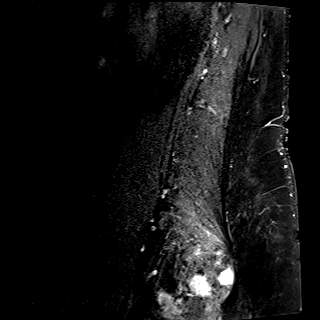
[im 6/17]
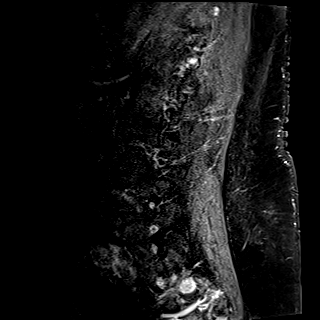

[27 of 48 positions shown; findings below may reference images not displayed]

FINDINGS: MRI THORACIC SPINE FINDINGS

Alignment: Vertebral bodies normally aligned with preservation of
the normal thoracic kyphosis. No listhesis.

Vertebrae: Innumerable metastatic lesions seen throughout the
thoracic spine, involving essentially all levels. Most prominent
involvement seen at the level of T3, where tumor involves the
posterior aspect of the T3 vertebral body with extension into the
posterior elements bilaterally. The posterior elements of T3 are
somewhat expanded without frank extra osseous tumor. No other
significant extra osseous or epidural tumor identified. Multifocal
rib involvement noted as well, also seen on prior CTs.

Pathologic fracture involving the inferior endplate of T9 with
associated 20% height loss without bony retropulsion. Otherwise,
vertebral body height maintained with no other pathologic fracture
identified.

Cord: Signal intensity within the thoracic spinal cord is within
normal limits. No cord signal abnormality or abnormal enhancement.
No significant epidural tumor at this time.

Paraspinal and other soft tissues: Patient's known primary mass
positioned at the posterior left lung apex again seen, better
evaluated on recent chest CT. Early/impending extension into the
left C7-T1 and T1-2 neural foramina. Associated edema within the
adjacent paraspinous soft tissues. Paraspinous soft tissues
demonstrate no other acute finding. Right greater than left layering
bilateral pleural effusions partially visualized.

Disc levels:

T1-2: Negative interspace. Probable early/impending extension of
tumor into the left neural foramen (series 18, image 14). No
significant stenosis at this time.

T2-3: Unremarkable.

T3-4:  Unremarkable.

T4-5: Small right paracentral disc protrusion mildly flattens the
right ventral thecal sac. No significant stenosis or cord deformity.
Foramina remain patent.

T5-6:  Unremarkable.

T6-7: Shallow broad-based central to left paracentral disc
protrusion flattens the ventral thecal sac. No significant spinal
stenosis or cord deformity. Foramina remain patent.

T7-8: Minimal disc bulge. Mild prominence of the dorsal epidural
fat. No significant stenosis.

T8-9: Broad-based central to left paracentral disc protrusion mildly
flattens the ventral thecal sac. No significant stenosis or cord
deformity. Foramina remain patent.

T9-10: Central disc extrusion with superior migration. Flattening of
the ventral thecal sac without significant stenosis or cord
deformity. Foramina remain patent.

T10-11: Negative interspace. Mild posterior element hypertrophy. No
stenosis.

T11-12:  Unremarkable.

T12-L1:  Unremarkable.

MRI LUMBAR SPINE FINDINGS

Segmentation:  Standard.

Alignment:  Physiologic.

Vertebrae: Innumerable metastatic lesion seen throughout the
visualized lumbar spine and pelvis, essentially involving all
levels. For reference purposes, largest lesion is position within
the posterior aspect of the L2 vertebral body and measures up to
cm. No significant extra osseous or epidural extension of tumor. No
associated pathologic fracture or other complication.

Conus medullaris: Extends to the L1 level and appears normal.
Incidental note made of a small fatty filum terminale. No
intracanalicular or epidural tumor. No other abnormal enhancement.

Paraspinal and other soft tissues: Paraspinous soft tissues
demonstrate no acute finding.

Disc levels:

L1-2:  Minimal annular disc bulge.  No canal or foraminal stenosis.

L2-3: Disc desiccation without significant disc bulge. No stenosis
or impingement.

L3-4: Disc desiccation without significant disc bulge. Mild facet
hypertrophy. No stenosis or impingement.

L4-5: Mild intervertebral disc space narrowing with diffuse disc
bulge, eccentric to the left. Mild facet and ligament flavum
hypertrophy. Borderline mild left lateral recess stenosis. Central
canal remains patent. No significant foraminal narrowing.

L5-S1: Disc desiccation. Superimposed shallow left foraminal disc
protrusion with annular fissure (series 23, image 32). Mild facet
hypertrophy. No significant spinal stenosis. Foramina remain patent.
IMPRESSION: 1. Widespread osseous metastases throughout the thoracic and lumbar
spine as well as the visualized pelvis, essentially involving all
levels. No significant extra osseous or epidural tumor at this time.
2. Pathologic fracture involving the inferior endplate of T9 with
associated mild 20% height loss without bony retropulsion.
3. Partial visualization of patient's known primary mass at the left
lung apex, better evaluated on recent chest CT. Early/impending
extension of tumor into the left C7-T1 and T1-2 neural foramina
without significant stenosis at this time.
4. Bilateral layering pleural effusions, right greater than left,
partially visualized.
5. Underlying mild multilevel degenerative spondylosis as above. No
significant spinal stenosis or neural impingement.

## 2020-06-25 IMAGING — MR MR THORACIC SPINE WO/W CM
7 of 9 series · 29 of 48 positions shown · IV contrast (gadavist)
Comparison: Prior PET-CT from [DATE] and chest CT from
[DATE]

CLINICAL DATA: Initial evaluation for acute back pain, metastatic
disease.

EXAM:
MRI THORACIC AND LUMBAR SPINE WITHOUT AND WITH CONTRAST
TECHNIQUE: Multiplanar and multiecho pulse sequences of the thoracic and lumbar
spine were obtained without and with intravenous contrast.
CONTRAST:  10mL GADAVIST GADOBUTROL 1 MMOL/ML IV SOLN

[Series 16: T1 · sagittal · 4.0mm · 1.72mm/px · 1 of 5 slices shown (1 of 3)]
[im 1/5]
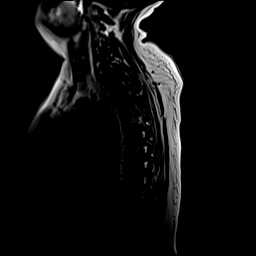

[Series 17: STIR · sagittal · 3.0mm · 1.06mm/px · 2 of 17 slices shown]
[im 1/17]
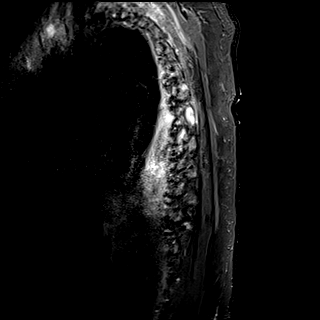
[im 17/17]
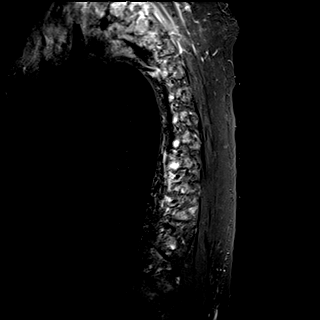

[Series 18: T1 · sagittal · 3.0mm · 1.06mm/px · 3 of 17 slices shown (2 of 3)]
[im 1/17]
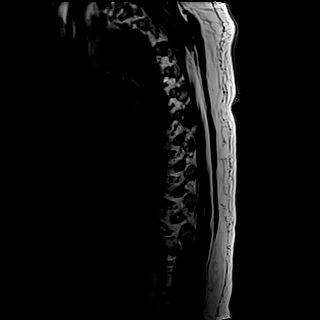
[im 9/17]
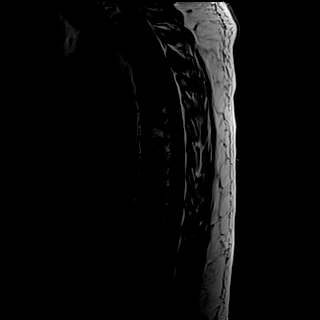
[im 17/17]
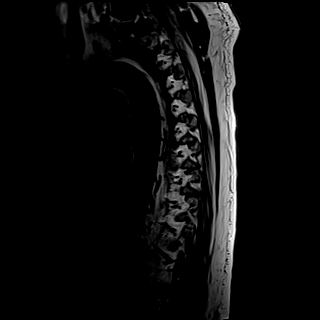

[Series 20: T2 · axial · 4.0mm · 0.78mm/px · z∈[-258,-12]mm · 9 of 49 slices shown]
[im 1/49]
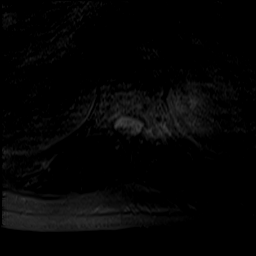
[im 7/49]
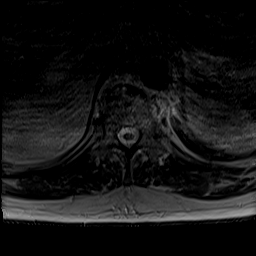
[im 13/49]
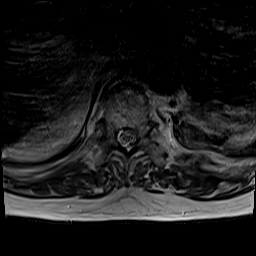
[im 19/49]
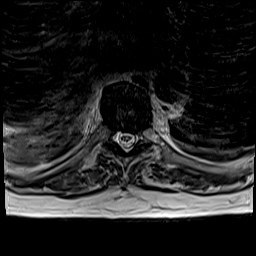
[im 25/49]
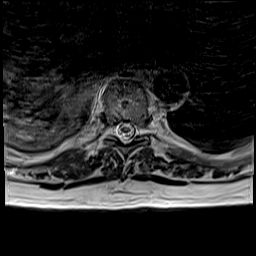
[im 31/49]
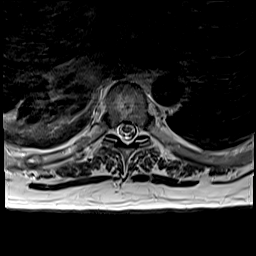
[im 37/49]
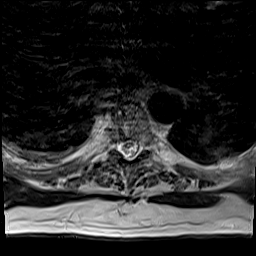
[im 43/49]
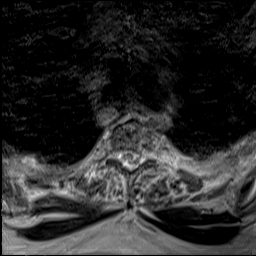
[im 49/49]
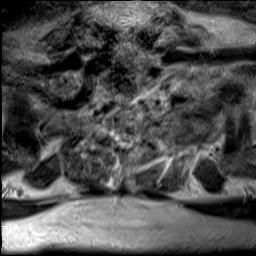

[Series 25: T1 · axial · 4.0mm · 0.39mm/px · z∈[-258,-12]mm · 8 of 49 slices shown (3 of 3)]
[im 1/49]
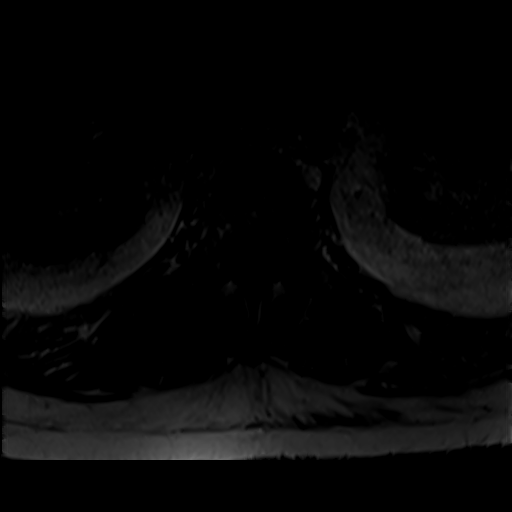
[im 7/49]
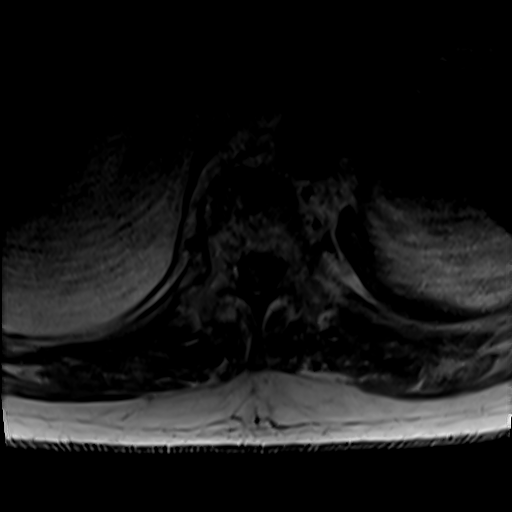
[im 13/49]
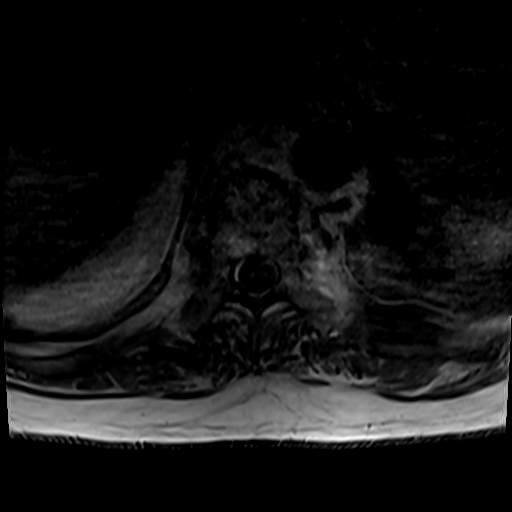
[im 19/49]
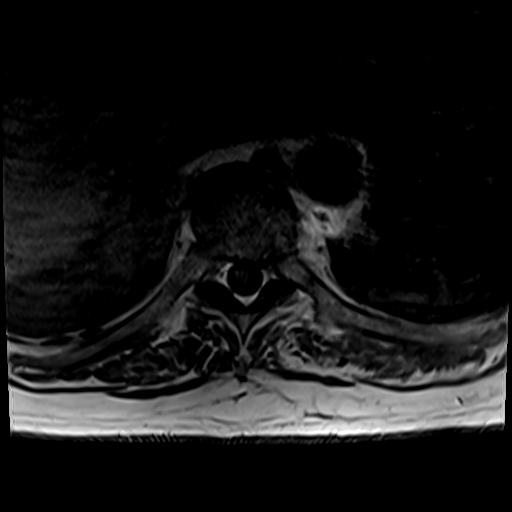
[im 31/49]
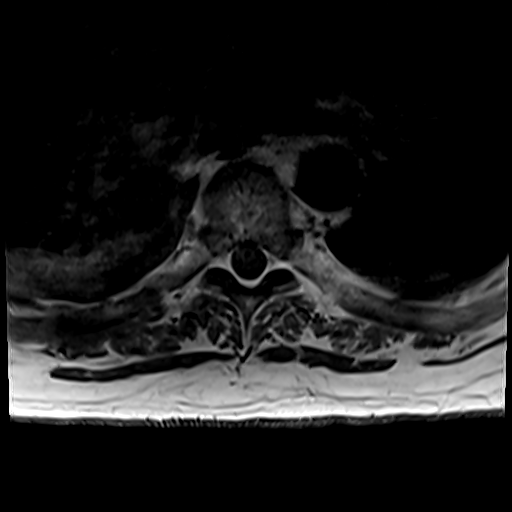
[im 37/49]
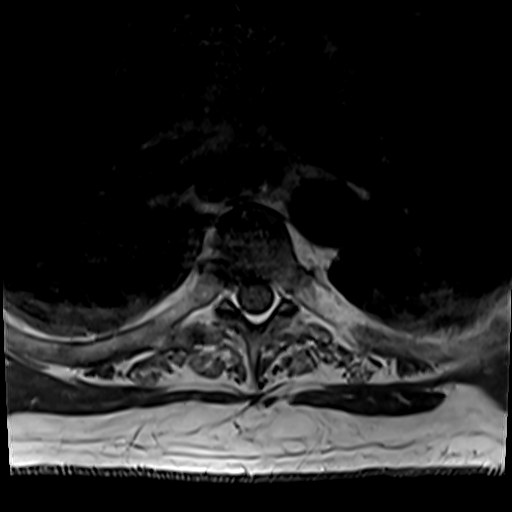
[im 43/49]
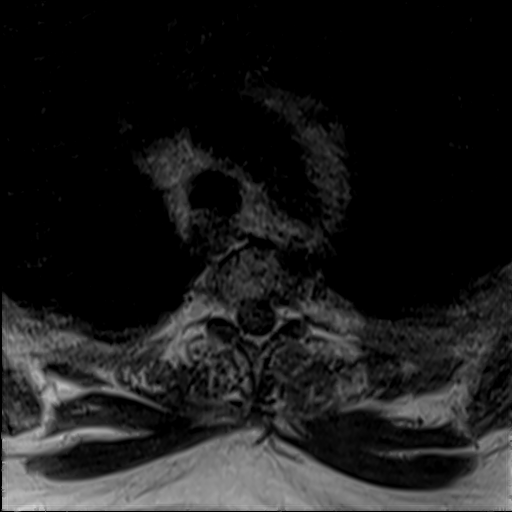
[im 49/49]
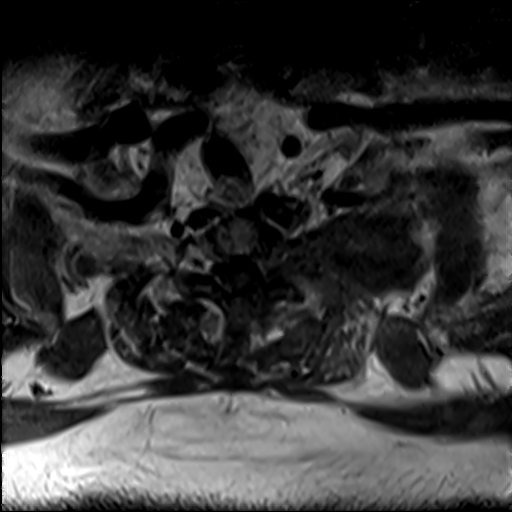

[Series 29: T2 post-contrast · sagittal · 3.0mm · 0.83mm/px · 3 of 17 slices shown]
[im 1/17]
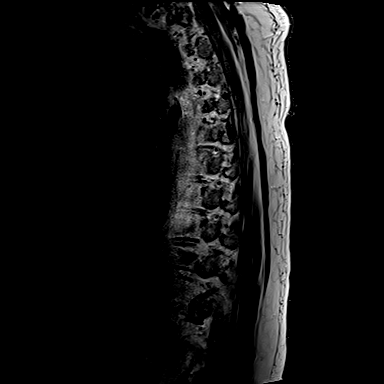
[im 9/17]
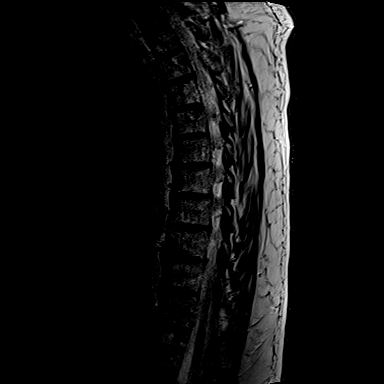
[im 17/17]
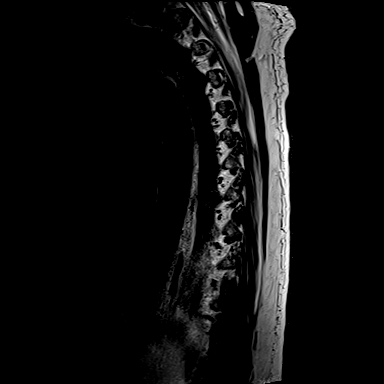

[Series 30: T1 fat-sat post-contrast · sagittal · 3.0mm · 1.00mm/px · 3 of 17 slices shown]
[im 1/17]
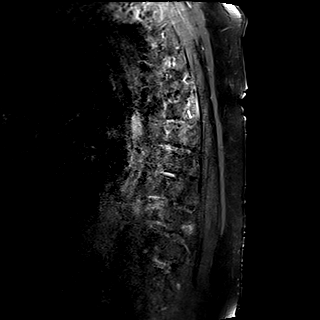
[im 9/17]
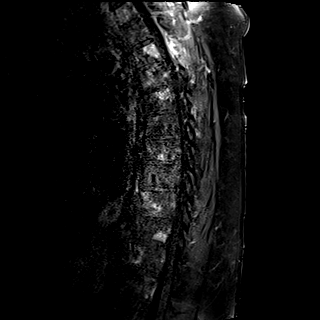
[im 17/17]
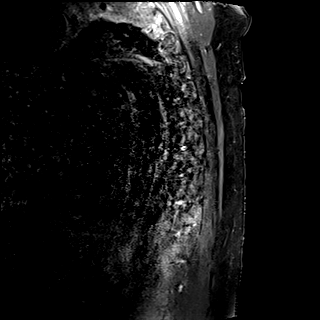

[29 of 48 positions shown; findings below may reference images not displayed]

FINDINGS: MRI THORACIC SPINE FINDINGS

Alignment: Vertebral bodies normally aligned with preservation of
the normal thoracic kyphosis. No listhesis.

Vertebrae: Innumerable metastatic lesions seen throughout the
thoracic spine, involving essentially all levels. Most prominent
involvement seen at the level of T3, where tumor involves the
posterior aspect of the T3 vertebral body with extension into the
posterior elements bilaterally. The posterior elements of T3 are
somewhat expanded without frank extra osseous tumor. No other
significant extra osseous or epidural tumor identified. Multifocal
rib involvement noted as well, also seen on prior CTs.

Pathologic fracture involving the inferior endplate of T9 with
associated 20% height loss without bony retropulsion. Otherwise,
vertebral body height maintained with no other pathologic fracture
identified.

Cord: Signal intensity within the thoracic spinal cord is within
normal limits. No cord signal abnormality or abnormal enhancement.
No significant epidural tumor at this time.

Paraspinal and other soft tissues: Patient's known primary mass
positioned at the posterior left lung apex again seen, better
evaluated on recent chest CT. Early/impending extension into the
left C7-T1 and T1-2 neural foramina. Associated edema within the
adjacent paraspinous soft tissues. Paraspinous soft tissues
demonstrate no other acute finding. Right greater than left layering
bilateral pleural effusions partially visualized.

Disc levels:

T1-2: Negative interspace. Probable early/impending extension of
tumor into the left neural foramen (series 18, image 14). No
significant stenosis at this time.

T2-3: Unremarkable.

T3-4:  Unremarkable.

T4-5: Small right paracentral disc protrusion mildly flattens the
right ventral thecal sac. No significant stenosis or cord deformity.
Foramina remain patent.

T5-6:  Unremarkable.

T6-7: Shallow broad-based central to left paracentral disc
protrusion flattens the ventral thecal sac. No significant spinal
stenosis or cord deformity. Foramina remain patent.

T7-8: Minimal disc bulge. Mild prominence of the dorsal epidural
fat. No significant stenosis.

T8-9: Broad-based central to left paracentral disc protrusion mildly
flattens the ventral thecal sac. No significant stenosis or cord
deformity. Foramina remain patent.

T9-10: Central disc extrusion with superior migration. Flattening of
the ventral thecal sac without significant stenosis or cord
deformity. Foramina remain patent.

T10-11: Negative interspace. Mild posterior element hypertrophy. No
stenosis.

T11-12:  Unremarkable.

T12-L1:  Unremarkable.

MRI LUMBAR SPINE FINDINGS

Segmentation:  Standard.

Alignment:  Physiologic.

Vertebrae: Innumerable metastatic lesion seen throughout the
visualized lumbar spine and pelvis, essentially involving all
levels. For reference purposes, largest lesion is position within
the posterior aspect of the L2 vertebral body and measures up to
cm. No significant extra osseous or epidural extension of tumor. No
associated pathologic fracture or other complication.

Conus medullaris: Extends to the L1 level and appears normal.
Incidental note made of a small fatty filum terminale. No
intracanalicular or epidural tumor. No other abnormal enhancement.

Paraspinal and other soft tissues: Paraspinous soft tissues
demonstrate no acute finding.

Disc levels:

L1-2:  Minimal annular disc bulge.  No canal or foraminal stenosis.

L2-3: Disc desiccation without significant disc bulge. No stenosis
or impingement.

L3-4: Disc desiccation without significant disc bulge. Mild facet
hypertrophy. No stenosis or impingement.

L4-5: Mild intervertebral disc space narrowing with diffuse disc
bulge, eccentric to the left. Mild facet and ligament flavum
hypertrophy. Borderline mild left lateral recess stenosis. Central
canal remains patent. No significant foraminal narrowing.

L5-S1: Disc desiccation. Superimposed shallow left foraminal disc
protrusion with annular fissure (series 23, image 32). Mild facet
hypertrophy. No significant spinal stenosis. Foramina remain patent.
IMPRESSION: 1. Widespread osseous metastases throughout the thoracic and lumbar
spine as well as the visualized pelvis, essentially involving all
levels. No significant extra osseous or epidural tumor at this time.
2. Pathologic fracture involving the inferior endplate of T9 with
associated mild 20% height loss without bony retropulsion.
3. Partial visualization of patient's known primary mass at the left
lung apex, better evaluated on recent chest CT. Early/impending
extension of tumor into the left C7-T1 and T1-2 neural foramina
without significant stenosis at this time.
4. Bilateral layering pleural effusions, right greater than left,
partially visualized.
5. Underlying mild multilevel degenerative spondylosis as above. No
significant spinal stenosis or neural impingement.

## 2020-06-25 MED ORDER — LORAZEPAM 2 MG/ML IJ SOLN
1.0000 mg | Freq: Once | INTRAMUSCULAR | Status: AC
  Administered 2020-06-25: 1 mg via INTRAVENOUS
  Filled 2020-06-25: qty 1

## 2020-06-25 MED ORDER — INSULIN ASPART 100 UNIT/ML ~~LOC~~ SOLN
7.0000 [IU] | Freq: Once | SUBCUTANEOUS | Status: AC
  Administered 2020-06-25: 7 [IU] via SUBCUTANEOUS

## 2020-06-25 MED ORDER — GADOBUTROL 1 MMOL/ML IV SOLN
10.0000 mL | Freq: Once | INTRAVENOUS | Status: AC | PRN
  Administered 2020-06-25: 10 mL via INTRAVENOUS

## 2020-06-25 MED ORDER — PROSOURCE PLUS PO LIQD
30.0000 mL | Freq: Two times a day (BID) | ORAL | Status: DC
  Administered 2020-06-26 – 2020-06-28 (×6): 30 mL via ORAL
  Filled 2020-06-25 (×6): qty 30

## 2020-06-25 MED ORDER — ADULT MULTIVITAMIN W/MINERALS CH
1.0000 | ORAL_TABLET | Freq: Every day | ORAL | Status: DC
  Administered 2020-06-26 – 2020-06-28 (×3): 1 via ORAL
  Filled 2020-06-25 (×3): qty 1

## 2020-06-25 MED ORDER — LIDOCAINE HCL 1 % IJ SOLN
INTRAMUSCULAR | Status: AC
  Filled 2020-06-25: qty 20

## 2020-06-25 MED ORDER — POTASSIUM CHLORIDE CRYS ER 20 MEQ PO TBCR
40.0000 meq | EXTENDED_RELEASE_TABLET | Freq: Once | ORAL | Status: AC
  Administered 2020-06-25: 40 meq via ORAL
  Filled 2020-06-25: qty 2

## 2020-06-25 NOTE — Progress Notes (Signed)
Notified Lab that ABG being sent for analysis. 

## 2020-06-25 NOTE — Progress Notes (Signed)
Initial Nutrition Assessment  DOCUMENTATION CODES:   Morbid obesity  INTERVENTION:   -Ensure Enlive po BID, each supplement provides 350 kcal and 20 grams of protein -Multivitamin with minerals daily -Prosource Plus PO BID, each provides 100 kcals and 15g protein  NUTRITION DIAGNOSIS:   Increased nutrient needs related to cancer and cancer related treatments as evidenced by estimated needs.  GOAL:   Patient will meet greater than or equal to 90% of their needs  MONITOR:   PO intake, Supplement acceptance, Labs, Weight trends, I & O's  REASON FOR ASSESSMENT:   Malnutrition Screening Tool    ASSESSMENT:   69 y.o. male, with a history of sleep apnea, HLD, DMII, COPD, and cancer who presents to the ED with a chief complaint of  Hyperglycemia. Patient is supposed to be on 60units of insulin with supper. He reports that he hasn't taken it in some time because he has not been eating. His wife reports that yesterday he started experiencing polydipsia. He drank some water, but mostly regular gatorade, and dr. Malachi Bonds. Patient reports that he hasn't been checking his glucose. His wife checked it today and the meter read high, so they came to the ER. Patient reports not taking his insulin because he has not been eating.  Patient currently disoriented.  Per chart review, pt has eaten very poorly and this has resulted in uncontrolled blood sugar levels as pt was not taking insulin given he was not eating. Per wife's report, pt ate only 1 meal over a week's time. Pt was drinking sugary beverages such as soda and regular Gatorade.    Pt with recent diagnosis of metastatic lung cancer. Not currently on treatment yet. IR to perform lymph node biopsy today.  Ensure supplements have been ordered. Will continue at this time given poor PO.    Per weight records, pt has lost 21 lbs since 7/13 (5% wt loss x 2 months, insignificant for time frame). However, weights are trending down.   Medications  reviewed. Labs reviewed:  CBGs: [id number]  NUTRITION - FOCUSED PHYSICAL EXAM:  Deferred given AMS  Diet Order:   Diet Order            Diet Carb Modified Fluid consistency: Thin; Room service appropriate? Yes  Diet effective now                 EDUCATION NEEDS:   Not appropriate for education at this time  Skin:  Skin Assessment: Reviewed RN Assessment  Last BM:  9/19  Height:   Ht Readings from Last 1 Encounters:  06/23/20 6\' 4"  (1.93 m)    Weight:   Wt Readings from Last 1 Encounters:  06/23/20 (!) 162.7 kg   BMI:  Body mass index is 43.66 kg/m.  Estimated Nutritional Needs:   Kcal:  2800-3000  Protein:  125-135g  Fluid:  >2L/day  Clayton Bibles, MS, RD, LDN Inpatient Clinical Dietitian Contact information available via Amion

## 2020-06-25 NOTE — Progress Notes (Addendum)
CBG is 336. MD made aware.

## 2020-06-25 NOTE — Progress Notes (Addendum)
MEDICATION-RELATED CONSULT NOTE   IR Procedure Consult - Anticoagulant/Antiplatelet PTA/Inpatient Med List Review by Pharmacist    Procedure: [place] core L supraclav LAN      Completed: 14:44  Post-Procedural bleeding risk per IR MD assessment:  standard  Antithrombotic medications on inpatient or PTA profile prior to procedure:   SQH    Recommended restart time per IR Post-Procedure Guidelines:  Day )- at least 6 hrs or next standard dose interval     Plan:    SQH 5000 Q8H- next dose due 2200  Prior to consult RN had already given dose at [place], Pharm.D 06/25/2020 2:55 PM  Addendum: MD contacted about early SQH Response "It is low risk. THanks for letting us know. Keep eye on L neck for any swelling/stridor"  Eudelia Bunch, Pharm.D 06/25/2020 3:04 PM

## 2020-06-25 NOTE — Progress Notes (Addendum)
Patient has pulled off tele leads multiple times, pulled out IV, taken off his O2, and pulled off his pulse ox finger probe. MD paged for prn.

## 2020-06-25 NOTE — Consult Note (Signed)
Chief Complaint: Request is for left supraclavicular lymph node biopsy  Referring Physician(s): Dr. Tomie China  Supervising Physician: Arne Cleveland  Patient Status: [facility] - In-pt  History of Present Illness: Kenneth Patrick is a 69 y.o. male Former smoker, history of HLD, DM, HTN, COPD, back pain, metastatic lung cancer. Presented to [facility] for hyperglycemia and COPD exacerbation and per wife at bedside AMS since admission. Patient was transferred to [facility]. Per note from Dr. Delton Coombes dated 8.31.21 there is an MRI from 8.18.21 from OSH shows lung mass and  Left sided supraclavicular lymphadenopathy.  PET san from 9.10.21 reads Hypermetabolic left supraclavicular lymph node is large and would be amenable to biopsy.  Team is requesting a left supraclavicular lymph node biopsy for further determination of possible primary.     Past Medical History:  Diagnosis Date  . Cancer ([place])    basal cell   . COPD (chronic obstructive pulmonary disease) (H[facility])    uses inhalers  . Depression   . Diabetic neuropathy ([place])   . DM2 (diabetes mellitus, type 2) (HCC)    insulin dependent  . Dyspnea    with exertion  . Hypercholesterolemia   . Low back pain    no meds  . Respiratory infection   . Sleep apnea    uses CPAP    Past Surgical History:  Procedure Laterality Date  . APPLICATION OF A-CELL OF HEAD/NECK Left 11/01/2018   Procedure: APPLICATION OF A-CELL TO SCALP WOUND;  Surgeon: Wallace Going, DO;  Location: [place];  Service: Plastics;  Laterality: Left;  . CATARACT EXTRACTION W/PHACO Right 04/21/2019   Procedure: CATARACT EXTRACTION PHACO AND INTRAOCULAR LENS PLACEMENT RIGHT EYE;  Surgeon: Baruch Goldmann, MD;  Location: [facility];  Service: Ophthalmology;  Laterality: Right;  right  . CHOLECYSTECTOMY    . DEBRIDEMENT AND CLOSURE WOUND Left 11/01/2018   Procedure: Debridement scalp wound;  Surgeon: Wallace Going, DO;  Location: [place];  Service: Plastics;  Laterality: Left;  . left eye enucleation following trauma      Allergies: Penicillins  Medications: Prior to Admission medications   Medication Sig Start Date End Date Taking? Authorizing Provider  albuterol (VENTOLIN HFA) 108 (90 BASE) MCG/ACT inhaler Inhale 2 puffs into the lungs every 6 (six) hours as needed for wheezing or shortness of breath.    Yes [provider]  ALPRAZolam Duanne Moron) 1 MG tablet Take 1 tablet (1 mg total) by mouth at bedtime as needed for anxiety. 06/17/20  Yes Derek Jack, MD  atorvastatin (LIPITOR) 80 MG tablet Take 80 mg by mouth at bedtime.  05/01/11  Yes [provider]  Continuous Blood Gluc Sensor (FREESTYLE LIBRE SENSOR SYSTEM) MISC Use one sensor every 10 days. 07/19/17  Yes Nida, Marella Chimes, MD  Fluticasone-Umeclidin-Vilant (TRELEGY ELLIPTA) 100-62.5-25 MCG/INH AEPB Inhale 1 puff into the lungs daily. 08/15/19  Yes Rigoberto Noel, MD  furosemide (LASIX) 40 MG tablet Take 40 mg by mouth daily as needed for fluid.    Yes [provider]  gabapentin (NEURONTIN) 300 MG capsule Take 300 mg by mouth 2 (two) times daily.    Yes [provider]  glipiZIDE (GLUCOTROL) 5 MG tablet TAKE 1 TABLET BY MOUTH TWICE DAILY BEFORE MEAL(S) Patient taking differently: Take 5 mg by mouth 2 (two) times daily before a meal.  02/28/20  Yes Nida, Marella Chimes, MD  HYDROcodone-acetaminophen (NORCO) 10-325 MG tablet Take 1 tablet by mouth 3 (three) times  daily as needed. 05/30/20  Yes [provider]  HYDROmorphone (DILAUDID) 2 MG tablet Take 1 tablet (2 mg total) by mouth every 3 (three) hours as needed for severe pain. 06/04/20  Yes Derek Jack, MD  insulin regular human CONCENTRATED (HUMULIN R U-500 KWIKPEN) 500 UNIT/ML kwikpen Inject 80 units with breakfast and Lunch, and 60 units with supper if BG is above 90 04/16/20  Yes Nida, Marella Chimes, MD  oxymetazoline (AFRIN) 0.05 % nasal  spray Place 1-2 sprays into both nostrils 2 (two) times daily as needed for congestion.    Yes [provider]  OZEMPIC, 0.25 OR 0.5 MG/DOSE, 2 MG/1.5ML SOPN INJECT 0.5 MG  INTO THE SKIN ONCE A WEEK 06/06/20  Yes Nida, Marella Chimes, MD  sertraline (ZOLOFT) 100 MG tablet Take 200 mg by mouth daily.    Yes [provider]  spironolactone (ALDACTONE) 25 MG tablet Take 1 tablet by mouth once daily 11/15/19  Yes Patwardhan, Manish J, MD  tiZANidine (ZANAFLEX) 4 MG tablet Take 4 mg by mouth 2 (two) times daily. 05/17/20  Yes [provider]  tobramycin (TOBREX) 0.3 % ophthalmic solution Place 1 drop into the right eye See admin instructions. Instill 1 drop into the right eye 3 times daily, the day before, the day of and the day after eye injection done every 6 weeks. 12/07/18  Yes [provider]  CARTIA XT 120 MG 24 hr capsule Take 1 capsule by mouth once daily 10/18/19 04/15/20  Patwardhan, Reynold Bowen, MD     Family History  Problem Relation Age of Onset  . Leukemia Father   . Hypertension Father   . Diabetes Mother   . Ovarian cancer Sister   . Kidney disease Sister   . Hypertension Son   . Hypertension Daughter     Social History   Socioeconomic History  . Marital status: Married    Spouse name: Not on file  . Number of children: 2  . Years of education: Not on file  . Highest education level: Not on file  Occupational History  . Occupation: retired Engineer, structural  Tobacco Use  . Smoking status: Former Smoker    Packs/day: 1.50    Years: 20.00    Pack years: 30.00    Types: Cigarettes    Start date: 25    Quit date: 10/05/2004    Years since quitting: 15.7  . Smokeless tobacco: Never Used  Vaping Use  . Vaping Use: Never used  Substance and Sexual Activity  . Alcohol use: No    Alcohol/week: 0.0 standard drinks  . Drug use: No    Comment: stopped smoking marijuana in 2006  . Sexual activity: Not on file  Other Topics Concern  . Not on file    Social History Narrative  . Not on file   Social Determinants of Health   Financial Resource Strain: Low Risk   . Difficulty of Paying Living Expenses: Not hard at all  Food Insecurity: No Food Insecurity  . Worried About Charity fundraiser in the Last Year: Never true  . Ran Out of Food in the Last Year: Never true  Transportation Needs: No Transportation Needs  . Lack of Transportation (Medical): No  . Lack of Transportation (Non-Medical): No  Physical Activity: Inactive  . Days of Exercise per Week: 0 days  . Minutes of Exercise per Session: 0 min  Stress: No Stress Concern Present  . Feeling of Stress : Not at all  Social  Connections: Moderately Isolated  . Frequency of Communication with Friends and Family: More than three times a week  . Frequency of Social Gatherings with Friends and Family: Once a week  . Attends Religious Services: Never  . Active Member of Clubs or Organizations: No  . Attends Archivist Meetings: Never  . Marital Status: Married     Review of Systems: A 12 point ROS discussed and pertinent positives are indicated in the HPI above.  All other systems are negative.  Review of Systems  Unable to perform ROS: Mental status change    Vital Signs: BP 128/84   Pulse 86   Temp 97.8 F (36.6 C) (Oral)   Resp 18   Ht 6\' 4"  (1.93 m)   Wt (!) 358 lb 11 oz (162.7 kg)   SpO2 92%   BMI 43.66 kg/m   Physical Exam Vitals and nursing note reviewed.  Constitutional:      Appearance: He is well-developed.     Comments: Able to follow commands.   HENT:     Head: Normocephalic.  Pulmonary:     Comments: On [place]  Musculoskeletal:        General: Normal range of motion.     Cervical back: Normal range of motion.  Skin:    General: Skin is dry.  Neurological:     Mental Status: He is alert. He is disoriented.     Imaging: CT Angio Chest PE W and/or Wo Contrast  Result Date: 06/23/2020 CLINICAL DATA:  Shortness of breath and cough x2  weeks. EXAM: CT ANGIOGRAPHY CHEST WITH CONTRAST TECHNIQUE: Multidetector CT imaging of the chest was performed using the standard protocol during bolus administration of intravenous contrast. Multiplanar CT image reconstructions and MIPs were obtained to evaluate the vascular anatomy. CONTRAST:  18mL OMNIPAQUE IOHEXOL 350 MG/ML SOLN COMPARISON:  April 16, 2017 FINDINGS: Cardiovascular: Satisfactory opacification of the pulmonary arteries to the segmental level. No evidence of pulmonary embolism. Mild artifact is seen overlying the pulmonary arteries within the perihilar region on the left. Normal heart size. No pericardial effusion. Mediastinum/Nodes: No enlarged mediastinal, hilar, or axillary lymph nodes. Thyroid gland, trachea, and esophagus demonstrate no significant findings. Lungs/Pleura: Mild atelectasis and/or early infiltrate is seen within the left lung base. A 4.9 cm x 3.2 cm heterogeneous low-attenuation mass is seen within the left apex. Associated areas of cortical destruction are seen involving the first and second left ribs, as well as the adjacent portions of the C7 and T1 vertebral bodies. A 2.6 cm x 1.4 cm soft tissue mass is seen along the anterolateral aspect of the right chest wall. This originates from the fifth right rib. There is no evidence of a pleural effusion or pneumothorax. Upper Abdomen: A 4.9 cm x 2.9 cm area of heterogeneous low attenuation is seen along the medial aspect of the liver dome. Surgical clips are noted within the gallbladder fossa. 1.5 cm 1.9 cm lymph nodes are seen along the posteromedial aspect of the inferior vena cava. Musculoskeletal: A 4.3 cm x 2.4 cm soft tissue mass is seen adjacent to the posterior aspect of the proximal left clavicle (axial CT images 1 through 17, CT series number 5). A 1.3 cm x 1.0 cm mildly expansile lytic lesion is seen within the posterior aspect of the fifth left rib. Similar appearing lesions are seen within the posteromedial aspect of  the tenth left rib, anterolateral aspect of the seventh left rib, posterior seventh right rib, posterolateral aspect of the eighth  right rib and lateral fourth right rib. A nondisplaced 12 left rib fracture is present. A chronic posterior eighth right rib fracture is seen. Multilevel degenerative changes seen throughout the thoracic spine. Multiple ill-defined lytic lesions are noted throughout multiple thoracic spine vertebral bodies. Review of the MIP images confirms the above findings. IMPRESSION: 1. No evidence of pulmonary embolism. 2. Heterogeneous left apical soft tissue mass with associated areas of cortical destruction involving the first and second left ribs, as well as the adjacent portions of the C7 and T1 vertebral bodies. This is consistent with primary lung malignancy. 3. 2.6 cm x 1.4 cm soft tissue mass along the anterolateral aspect of the right chest wall, which originates from the fifth right rib. This is concerning for metastatic disease. 4. Multiple ill-defined lytic lesions throughout multiple thoracic spine vertebral bodies and multiple ribs, consistent with osseous metastasis. 5. 4.9 cm x 2.9 cm area of heterogeneous low attenuation along the medial aspect of the liver dome. This is suspicious for a metastatic lesion. 6. Mild atelectasis and/or early infiltrate within the left lung base. 7. Evidence of prior cholecystectomy. Electronically Signed   By: Virgina Norfolk M.D.   On: 06/23/2020 19:35   MR Brain W Wo Contrast  Result Date: 06/17/2020 CLINICAL DATA:  Lung cancer EXAM: MRI HEAD WITHOUT AND WITH CONTRAST TECHNIQUE: Multiplanar, multiecho pulse sequences of the brain and surrounding structures were obtained without and with intravenous contrast. CONTRAST:  54mL GADAVIST GADOBUTROL 1 MMOL/ML IV SOLN COMPARISON:  None. FINDINGS: Brain: Scattered punctate FLAIR hyperintense foci involving the periventricular and subcortical white matter are nonspecific however commonly associated  with chronic microvascular ischemic changes. No diffusion-weighted signal abnormality. No intracranial hemorrhage. No midline shift, ventriculomegaly or extra-axial fluid collection. No mass lesion. No abnormal enhancement. Vascular: Normal flow voids. Skull and upper cervical spine: Normal marrow signal. Sinuses/Orbits: Sequela of right lens replacement. Sequela of left enucleation. Mild ethmoid and left maxillary sinus mucosal thickening. No mastoid effusion. Other: Postprocedural appearance of the left frontal scalp. IMPRESSION: No evidence of intracranial metastases. Minimal chronic microvascular ischemic changes. Electronically Signed   By: Primitivo Gauze M.D.   On: 06/17/2020 08:53   NM PET Image Initial (PI) Skull Base To Thigh  Result Date: 06/14/2020 CLINICAL DATA:  Initial treatment strategy for lung mass. EXAM: NUCLEAR MEDICINE PET SKULL BASE TO THIGH TECHNIQUE: 15.1 mCi F-18 FDG was injected intravenously. Full-ring PET imaging was performed from the skull base to thigh after the radiotracer. CT data was obtained and used for attenuation correction and anatomic localization. Fasting blood glucose: 313 mg/dl COMPARISON:  None available FINDINGS: Mediastinal blood pool activity: SUV max 3.5 Liver activity: SUV max 6.1 NECK: Enlarged hypermetabolic LEFT supraclavicular lymph node measures 25 mm short axis with SUV max equal 10.7. Incidental CT findings: none CHEST: Hypermetabolic LEFT apical mass measures approximately 38 mm x 36 mm with SUV max equal 10.1. The mass involves the apical chest wall with destruction of the first rib posteriorly (image 40). Several hypermetabolic small prevascular lymph nodes as well as hypermetabolic thickening of the pleural surface along the medial aspect of the LEFT upper mediastinum. Incidental CT findings: none ABDOMEN/PELVIS: Ill-defined hypermetabolic lesion in the dome liver measures approximately 15 mm SUV max equal 9.4. Hypermetabolic periportal lymph node  measures 18 mm SUV max equal 7.5. Enlarged lymph node position between the aorta and IVC measuring 11 mm inferior to the kidneys with SUV max equal 4.8 (image 141) Incidental CT findings: Diverticulosis noted. Atherosclerotic calcification of the  aorta. SKELETON: Widespread hypermetabolic lesions within the pelvis, spine and multiple ribs. Pathologic fractures involving several of the posterior RIGHT ribs (SUV max equal 7.2 on image 77 associated RIGHT rib fracture). Expansile hypermetabolic lesion in the anterior RIGHT rib with SUV max equal 12.1 (image 77). Lesion in the mid sternum with SUV max equal 6.5. Lesion the posterior RIGHT iliac bone with SUV max equal 8.1. Incidental CT findings: none IMPRESSION: 1. 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 2. Hypermetabolic LEFT supraclavicular lymph node is large and would be amenable to biopsy. 3. Multifocal hypermetabolic skeletal metastasis involving the ribs, sternum, spine and pelvis. Pathologic fracture involving several of the RIGHT ribs. 4. Ill-defined hypermetabolic lesion in the dome liver consistent with hepatic metastasis. 5. Nodal metastasis to the prevascular space, LEFT supraclavicular node, and abdominal periportal/retroperitoneal nodes. Electronically Signed   By: Suzy Bouchard M.D.   On: 06/14/2020 07:27   DG Chest Portable 1 View  Result Date: 06/23/2020 CLINICAL DATA:  Hypoxia with shortness of breath and cough 2 weeks. History of lung cancer. EXAM: PORTABLE CHEST 1 VIEW COMPARISON:  04/30/2020 and PET-CT 06/13/2020 FINDINGS: Lungs are adequately inflated demonstrate worsening pleural base mass over the lateral right mid to lower thorax. Right eighth posterior rib fracture which may be pathologic. Lungs are adequately inflated without acute airspace process or effusion. Cardiomediastinal silhouette is normal. Suggestion of lytic process  involving the right posterior seventh rib. IMPRESSION: 1. No acute airspace process. 2. Worsening pleural based mass over the lateral right mid to lower thorax. Right eighth posterior rib fracture which may be pathologic. Possible lytic process involving the right posterior seventh rib. These findings are likely due to metastatic disease. Electronically Signed   By: Marin Olp M.D.   On: 06/23/2020 16:25   DG HIP UNILAT W OR W/O PELVIS 2-3 VIEWS RIGHT  Result Date: 06/05/2020 CLINICAL DATA:  Right posterior hip pain.  No known injury. EXAM: DG HIP (WITH OR WITHOUT PELVIS) 2-3V RIGHT COMPARISON:  None. FINDINGS: Bones of the pelvis appear normal. Sacroiliac joints and symphysis pubis appear normal. No joint space narrowing of either hip joint. No sign of avascular necrosis or focal bone lesion. IMPRESSION: Negative radiographs. Electronically Signed   By: Nelson Chimes M.D.   On: 06/05/2020 16:01    Labs:  CBC: Recent Labs    06/04/20 1544 06/23/20 1540 06/24/20 0439  WBC 13.0* 10.7* 15.8*  HGB 12.0* 10.7* 10.7*  HCT 37.2* 34.1* 32.5*  PLT 281 348 383    COAGS: No results for input(s): INR, APTT in the last 8760 hours.  BMP: Recent Labs    06/04/20 1544 06/23/20 1540 06/23/20 2322 06/24/20 0439  NA 135 122* 130* 137  K 3.4* 4.7 3.3* 3.2*  CL 100 82* 90* 96*  CO2 22 25 26 29   GLUCOSE 108* 895* 615* 171*  BUN 37* 35* 33* 30*  CALCIUM 9.4 10.3 10.7* 11.0*  CREATININE 2.07* 1.66* 1.50* 1.28*  GFRNONAA 32* 42* 47* 57*  GFRAA 37* 48* 55* >60    LIVER FUNCTION TESTS: Recent Labs    07/26/19 1445 07/26/19 1445 04/09/20 1154 06/04/20 1544 06/23/20 1540 06/24/20 0439  BILITOT 1.2  --  0.8 0.6 1.2 0.8  AST 83*   < > 25 26 12* 18  ALT 57*   < > 23 28 19 18   ALKPHOS 151*  --   --  151* 225* 201*  PROT  6.7  --  6.5 7.7 7.3 6.9  ALBUMIN 4.4  --   --  3.7 3.2* 2.9*   < > = values in this interval not displayed.   Assessment and Plan:  69 y.o. male inpatient. Former  smoker, history of HLD, DM, HTN, COPD, back pain, metastatic lung cancer. Presented to [facility] for hyperglycemia and COPD exacerbation and per wife at bedside AMS since admission. Patient was transferred to [facility]. Per note from D.r. Delton Coombes dated 8.31.21 there is an MRI from 8.18.21 from OSH shows lung mass and  Left sided supraclavicular lymphadenopathy.  PET san from 9.10.21 reads Hypermetabolic left supraclavicular lymph node is large and would be amenable to biopsy.  Team is requesting a left supraclavicular lymph node biopsy for further determination of possible primary.    GLcose 171, BUN 30, Cr 1.28, WBC is 15.8, Alkaline Phosphatase 201. All other labs and medications are within acceptable parameters. Allergies to pencillin. Patient has been NPO since midnight.  Risks and benefits of left supraclavicular lymph node biopsy was discussed with the patient and patient's wife including, but not limited to bleeding, infection, damage to adjacent structures or low yield requiring additional tests.  All of the questions were answered and there is agreement to proceed.  Consent signed and in chart.   Thank you for this interesting consult.  I greatly enjoyed meeting Kenneth Patrick and look forward to participating in their care.  A copy of this report was sent to the requesting provider on this date.  Electronically Signed: Jacqualine Mau, NP 06/25/2020, 9:35 AM   I spent a total of 40 Minutes    in face to face in clinical consultation, greater than 50% of which was counseling/coordinating care for left supra clavicular lymph node biopsy.

## 2020-06-25 NOTE — Progress Notes (Signed)
Patient did not have any affect from ativan. Still actively pulling IV out. Mitts put on patient at that time and he was able to pull them off. MD paged. Non-violent restraints ordered. IV team consulted. Wife was called to notify. No answer and no voicemail box.

## 2020-06-25 NOTE — Progress Notes (Addendum)
PROGRESS NOTE    Kenneth Patrick  EXH:[id number] DOB: November 19, 1950 DOA: 06/23/2020 PCP: [name], MD    Brief Narrative:As per H&P written by Dr. Clearence Ped on 06/23/20 [name] a68 y.o.male,with a history of sleep apnea, HLD, DMII, COPD, and cancer who presents to the ED with a chief complaint of Hyperglycemia. Patient is supposed to be on 60units of insulin with supper. He reports that he hasn't taken it in some time because he has not been eating. His wife reports that yesterday he started experiencing polydipsia. He drank some water, but mostly regular gatorade, and dr. Malachi Bonds. Patient reports that he hasn't been checking his glucose. His wife checked it today and the meter read high, so they came to the ER. Patient reports not taking his insulin because he has not been eating. Last normal meal was last week, and wife believes he only had one meal total last week. Patient reports that he suspected his diet was not good for DMII, so he just stopped eating.In addition to this, he has been having increasing dyspnea over the last 2 weeks. He has had associated weakness. He hasn't been able to walk for past two days. Wife has been pushing him in an office chair to the bathroom. He has been incontinent since yesterday, with polyuria that is likely due to the hyperglycemia.He has had a productive cough as well, with yellow sputum. No hemoptysis. He has had no fevers, no sick contacts. Patient is not vaccinated for covid, he reports that he "doesn't trust it."   Of note, patient has had increasing back pain over a couple of months. Patient reports that he had imaging done that showed likely neoplasm, and he has been following with Dr. Raliegh Ip. He is supposed to have a biopsy on 06/25/20. For this reason, he will be admitted to [facility] long.  ED course T 98.8, P 95, R 33, BP 148/57 PH 7.399, CO2 25, Gap 15, Glucose 895 WBC 10.7 BUN/Cr 35/1.66 CTA = No PE. Primary lung cancer with mets.    Albuterol, aspirin, LR 9ml, famotidine 20mg , insulin drip BNP 39 Assessment & Plan:   Active Problems:   Respiratory failure with hypoxia (HCC)   Hyperosmolar hyperglycemic state (HHS) ([place])   #1 lung mass-patient follows up with oncology at [place] [facility]. Wife reports patient has incontinence of urine which is new.  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  Hypermetabolic LEFT supraclavicular lymph node is large and would be amenable to biopsy. Multifocal hypermetabolic skeletal metastasis involving the ribs, sternum, spine and pelvis. Pathologic fracture involving several of the RIGHT ribs. Ill-defined hypermetabolic lesion in the dome liver consistent with hepatic metastasis.  Nodal metastasis to the prevascular space, LEFTsupraclavicular node, and abdominal periportal/retroperitoneal nodes.  MRI of the brain 06/17/2020 no evidence of intracranial meta stasis, minimal chronic microvascular ischemic changes.  CT angiogram of the chest 06/23/2020-No evidence of pulmonary embolism.  Heterogeneous left apical soft tissue mass with associated areas of cortical destruction involving the first and second left ribs, as well as the adjacent portions of the C7 and T1 vertebral bodies. This is consistent with primary lung malignancy. 2.6 cm x 1.4 cm soft tissue mass along the anterolateral aspect of the right chest wall, which originates from the fifth right rib. This is concerning for metastatic disease. Multiple ill-defined lytic lesions throughout multiple thoracic spine vertebral bodies  and multiple ribs, consistent with osseous metastasis.  4.9 cm x 2.9 cm area of heterogeneous low attenuation along the medial aspect of the liver dome. This is suspicious for a metastatic lesion.  Mild atelectasis and/or early infiltrate  within the left lung base. Evidence of prior cholecystectomy.  Will get MRI of the thoracic and lumbar spine with new onset urinary incontinence.  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  #3 acute hypoxic respiratory failure -likely multifactorial COPD and possible primary lung cancer see CT scan of the chest above -patient's pulse ox was 86% and 87% in ER on room air.  Patient does not have oxygen at home.  Currently saturating 92% on 4 L.  Patient has history of severe COPD smoked for 38 years.  Will titrate to keep his sats above 90%. He is not actively wheezing I will hold off on steroids but continue nebulizer treatments.  #4 leukocytosis?  Patient has not received any steroids so far.  However his white count has jumped up compared to yesterday.  Check UA.  He has been afebrile.  #5 hypokalemia replete and recheck labs in a.m.  #6 goals of care patient is a full code with guarded prognosis    Nutrition Problem: Increased nutrient needs Etiology: cancer and cancer related treatments     Signs/Symptoms: estimated needs    Interventions: Ensure Enlive (each supplement provides 350kcal and 20 grams of protein), MVI, Prostat  Estimated body mass index is 43.66 kg/m as calculated from the following:   Height as of this encounter: 6\' 4"  (1.93 m).   Weight as of this encounter: 162.7 kg.  DVT prophylaxis: Heparin/SCD Code Status: Full code Family Communication discussed with patient's wife disposition Plan:  Status is: Inpatient  Dispo: The patient is from: Home              Anticipated d/c is to: Unknown              Anticipated d/c date is: > 3 days              Patient currently is not medically stable to d/c.  Patient sent here from [facility] for biopsy of the lymph node for possible new lung mass.  He is  extremely confused and is undergoing further work-up.   Consultants: Interventional radiology  Procedures: None  Antimicrobials: None  Subjective:  Patient seems to be in respiratory distress when I saw him this morning.  He appeared to be using accessory muscles for breathing however he was able to speak in full sentences.  He reported a history of COPD.  He was confused as to where he is.  However he answered some of the questions appropriately. A stat ABG that was sent showed no evidence of hypercapnia. Wife concerned and reports that patient has urinary incontinence which is new Objective: Vitals:   06/24/20 2254 06/25/20 0604 06/25/20 0624 06/25/20 0814  BP: 119/64 (!) 143/120 128/84   Pulse: 90 87 86   Resp: 18 18    Temp: 97.8 F (36.6 C)     TempSrc: Oral     SpO2: 91% 92%  92%  Weight:      Height:        Intake/Output Summary (Last 24 hours) at 06/25/2020 1123 Last data filed at 06/25/2020 0200 Gross per  24 hour  Intake 1135.04 ml  Output 0 ml  Net 1135.04 ml   Filed Weights   06/23/20 1513  Weight: (!) 162.7 kg    Examination:  General exam: Appears restless Respiratory system: Few scattered rhonchi to auscultation. Respiratory effort normal. Cardiovascular system: S1 & S2 heard, RRR. No JVD, murmurs, rubs, gallops or clicks. No pedal edema. Gastrointestinal system: Abdomen is nondistended, soft and nontender. No organomegaly or masses felt. Normal bowel sounds heard. Central nervous system: Alert and oriented. No focal neurological deficits. Extremities: He is moving his extremities trace bilateral pitting edema Skin: No rashes, lesions or ulcers Psychiatry: Judgement and insight appear normal. Mood & affect appropriate.     Data Reviewed: I have personally reviewed following labs and imaging studies  CBC: Recent Labs  Lab 06/23/20 1540 06/24/20 0439  WBC 10.7* 15.8*  NEUTROABS 9.3* 12.4*  HGB 10.7* 10.7*  HCT 34.1* 32.5*  MCV 90.2 84.9  PLT  348 470   Basic Metabolic Panel: Recent Labs  Lab 06/23/20 1540 06/23/20 2322 06/24/20 0439  NA 122* 130* 137  K 4.7 3.3* 3.2*  CL 82* 90* 96*  CO2 25 26 29   GLUCOSE 895* 615* 171*  BUN 35* 33* 30*  CREATININE 1.66* 1.50* 1.28*  CALCIUM 10.3 10.7* 11.0*  MG  --   --  2.0   GFR: Estimated Creatinine Clearance: 91.6 mL/min (A) (by C-G formula based on SCr of 1.28 mg/dL (H)). Liver Function Tests: Recent Labs  Lab 06/23/20 1540 06/24/20 0439  AST 12* 18  ALT 19 18  ALKPHOS 225* 201*  BILITOT 1.2 0.8  PROT 7.3 6.9  ALBUMIN 3.2* 2.9*   No results for input(s): LIPASE, AMYLASE in the last 168 hours. No results for input(s): AMMONIA in the last 168 hours. Coagulation Profile: No results for input(s): INR, PROTIME in the last 168 hours. Cardiac Enzymes: No results for input(s): CKTOTAL, CKMB, CKMBINDEX, TROPONINI in the last 168 hours. BNP (last 3 results) No results for input(s): PROBNP in the last 8760 hours. HbA1C: No results for input(s): HGBA1C in the last 72 hours. CBG: Recent Labs  Lab 06/24/20 1245 06/24/20 1632 06/24/20 2004 06/25/20 0626 06/25/20 0828  GLUCAP 181* 297* 266* 336* 361*   Lipid Profile: No results for input(s): CHOL, HDL, LDLCALC, TRIG, CHOLHDL, LDLDIRECT in the last 72 hours. Thyroid Function Tests: No results for input(s): TSH, T4TOTAL, FREET4, T3FREE, THYROIDAB in the last 72 hours. Anemia Panel: No results for input(s): VITAMINB12, FOLATE, FERRITIN, TIBC, IRON, RETICCTPCT in the last 72 hours. Sepsis Labs: Recent Labs  Lab 06/23/20 1554  LATICACIDVEN 1.6    Recent Results (from the past 240 hour(s))  SARS Coronavirus 2 by RT PCR (hospital order, performed in [facility] hospital lab) Nasopharyngeal Nasopharyngeal Swab     Status: None   Collection Time: 06/23/20  3:54 PM   Specimen: Nasopharyngeal Swab  Result Value Ref Range Status   SARS Coronavirus 2 NEGATIVE NEGATIVE Final    Comment: (NOTE) SARS-CoV-2 target nucleic  acids are NOT DETECTED.  The SARS-CoV-2 RNA is generally detectable in upper and lower respiratory specimens during the acute phase of infection. The lowest concentration of SARS-CoV-2 viral copies this assay can detect is 250 copies / mL. A negative result does not preclude SARS-CoV-2 infection and should not be used as the sole basis for treatment or other patient management decisions.  A negative result may occur with improper specimen collection / handling, submission of specimen other than nasopharyngeal swab, presence of  viral mutation(s) within the areas targeted by this assay, and inadequate number of viral copies (<250 copies / mL). A negative result must be combined with clinical observations, patient history, and epidemiological information.  Fact Sheet for Patients:   StrictlyIdeas.no  Fact Sheet for Healthcare Providers: BankingDealers.co.za  This test is not yet approved or  cleared by the [place] FDA and has been authorized for detection and/or diagnosis of SARS-CoV-2 by FDA under an Emergency Use Authorization (EUA).  This EUA will remain in effect (meaning this test can be used) for the duration of the COVID-19 declaration under Section 564(b)(1) of the Act, 21 U.S.C. section 360bbb-3(b)(1), unless the authorization is terminated or revoked sooner.  Performed at [facility] [address] [place], [place] [address]          Radiology Studies: CT Angio Chest PE W and/or Wo Contrast  Result Date: 06/23/2020 CLINICAL DATA:  Shortness of breath and cough x2 weeks. EXAM: CT ANGIOGRAPHY CHEST WITH CONTRAST TECHNIQUE: Multidetector CT imaging of the chest was performed using the standard protocol during bolus administration of intravenous contrast. Multiplanar CT image reconstructions and MIPs were obtained to evaluate the vascular anatomy. CONTRAST:  22mL OMNIPAQUE IOHEXOL 350 MG/ML SOLN COMPARISON:  April 16, 2017  FINDINGS: Cardiovascular: Satisfactory opacification of the pulmonary arteries to the segmental level. No evidence of pulmonary embolism. Mild artifact is seen overlying the pulmonary arteries within the perihilar region on the left. Normal heart size. No pericardial effusion. Mediastinum/Nodes: No enlarged mediastinal, hilar, or axillary lymph nodes. Thyroid gland, trachea, and esophagus demonstrate no significant findings. Lungs/Pleura: Mild atelectasis and/or early infiltrate is seen within the left lung base. A 4.9 cm x 3.2 cm heterogeneous low-attenuation mass is seen within the left apex. Associated areas of cortical destruction are seen involving the first and second left ribs, as well as the adjacent portions of the C7 and T1 vertebral bodies. A 2.6 cm x 1.4 cm soft tissue mass is seen along the anterolateral aspect of the right chest wall. This originates from the fifth right rib. There is no evidence of a pleural effusion or pneumothorax. Upper Abdomen: A 4.9 cm x 2.9 cm area of heterogeneous low attenuation is seen along the medial aspect of the liver dome. Surgical clips are noted within the gallbladder fossa. 1.5 cm 1.9 cm lymph nodes are seen along the posteromedial aspect of the inferior vena cava. Musculoskeletal: A 4.3 cm x 2.4 cm soft tissue mass is seen adjacent to the posterior aspect of the proximal left clavicle (axial CT images 1 through 17, CT series number 5). A 1.3 cm x 1.0 cm mildly expansile lytic lesion is seen within the posterior aspect of the fifth left rib. Similar appearing lesions are seen within the posteromedial aspect of the tenth left rib, anterolateral aspect of the seventh left rib, posterior seventh right rib, posterolateral aspect of the eighth right rib and lateral fourth right rib. A nondisplaced 12 left rib fracture is present. A chronic posterior eighth right rib fracture is seen. Multilevel degenerative changes seen throughout the thoracic spine. Multiple ill-defined  lytic lesions are noted throughout multiple thoracic spine vertebral bodies. Review of the MIP images confirms the above findings. IMPRESSION: 1. No evidence of pulmonary embolism. 2. Heterogeneous left apical soft tissue mass with associated areas of cortical destruction involving the first and second left ribs, as well as the adjacent portions of the C7 and T1 vertebral bodies. This is consistent with primary lung malignancy. 3. 2.6 cm x  1.4 cm soft tissue mass along the anterolateral aspect of the right chest wall, which originates from the fifth right rib. This is concerning for metastatic disease. 4. Multiple ill-defined lytic lesions throughout multiple thoracic spine vertebral bodies and multiple ribs, consistent with osseous metastasis. 5. 4.9 cm x 2.9 cm area of heterogeneous low attenuation along the medial aspect of the liver dome. This is suspicious for a metastatic lesion. 6. Mild atelectasis and/or early infiltrate within the left lung base. 7. Evidence of prior cholecystectomy. Electronically Signed   By: Virgina Norfolk M.D.   On: 06/23/2020 19:35   DG Chest Portable 1 View  Result Date: 06/23/2020 CLINICAL DATA:  Hypoxia with shortness of breath and cough 2 weeks. History of lung cancer. EXAM: PORTABLE CHEST 1 VIEW COMPARISON:  04/30/2020 and PET-CT 06/13/2020 FINDINGS: Lungs are adequately inflated demonstrate worsening pleural base mass over the lateral right mid to lower thorax. Right eighth posterior rib fracture which may be pathologic. Lungs are adequately inflated without acute airspace process or effusion. Cardiomediastinal silhouette is normal. Suggestion of lytic process involving the right posterior seventh rib. IMPRESSION: 1. No acute airspace process. 2. Worsening pleural based mass over the lateral right mid to lower thorax. Right eighth posterior rib fracture which may be pathologic. Possible lytic process involving the right posterior seventh rib. These findings are likely due  to metastatic disease. Electronically Signed   By: Marin Olp M.D.   On: 06/23/2020 16:25        Scheduled Meds: . [START ON 06/26/2020] (feeding supplement) PROSource Plus  30 mL Oral BID BM  . atorvastatin  80 mg Oral QHS  . diltiazem  240 mg Oral Daily  . feeding supplement (ENSURE ENLIVE)  237 mL Oral BID BM  . gabapentin  300 mg Oral BID  . heparin  5,000 Units Subcutaneous Q8H  . insulin aspart  0-20 Units Subcutaneous TID WC  . insulin aspart  0-5 Units Subcutaneous QHS  . insulin detemir  20 Units Subcutaneous BID  . mometasone-formoterol  2 puff Inhalation BID   And  . umeclidinium bromide  1 puff Inhalation Daily  . [START ON 06/26/2020] multivitamin with minerals  1 tablet Oral Daily  . pantoprazole  40 mg Oral Daily  . sertraline  200 mg Oral Daily  . spironolactone  25 mg Oral Daily   Continuous Infusions: . sodium chloride 100 mL/hr at 06/24/20 1836  . insulin Stopped (06/24/20 1335)     LOS: 2 days     Georgette Shell, MD  06/25/2020, 11:23 AM

## 2020-06-25 NOTE — Progress Notes (Signed)
  Echocardiogram 2D Echocardiogram has been performed.  Kenneth Patrick 06/25/2020, 1:53 PM

## 2020-06-25 NOTE — Procedures (Signed)
  Procedure: [place] core L supraclav LAN   EBL:   minimal Complications:  none immediate  See full dictation in [facility].  Dillard Cannon MD Main # [phone] Pager  [phone] Mobile [phone]

## 2020-06-26 LAB — CBC
HCT: 30.2 % — ABNORMAL LOW (ref 39.0–52.0)
Hemoglobin: 9.9 g/dL — ABNORMAL LOW (ref 13.0–17.0)
MCH: 28 pg (ref 26.0–34.0)
MCHC: 32.8 g/dL (ref 30.0–36.0)
MCV: 85.6 fL (ref 80.0–100.0)
Platelets: 324 10*3/uL (ref 150–400)
RBC: 3.53 MIL/uL — ABNORMAL LOW (ref 4.22–5.81)
RDW: 14.9 % (ref 11.5–15.5)
WBC: 10.2 10*3/uL (ref 4.0–10.5)
nRBC: 0 % (ref 0.0–0.2)

## 2020-06-26 LAB — COMPREHENSIVE METABOLIC PANEL
ALT: 20 U/L (ref 0–44)
AST: 26 U/L (ref 15–41)
Albumin: 2.5 g/dL — ABNORMAL LOW (ref 3.5–5.0)
Alkaline Phosphatase: 158 U/L — ABNORMAL HIGH (ref 38–126)
Anion gap: 11 (ref 5–15)
BUN: 30 mg/dL — ABNORMAL HIGH (ref 8–23)
CO2: 28 mmol/L (ref 22–32)
Calcium: 10 mg/dL (ref 8.9–10.3)
Chloride: 97 mmol/L — ABNORMAL LOW (ref 98–111)
Creatinine, Ser: 1.14 mg/dL (ref 0.61–1.24)
GFR calc Af Amer: >60 mL/min (ref >60)
GFR calc non Af Amer: >60 mL/min (ref >60)
Glucose, Bld: 241 mg/dL — ABNORMAL HIGH (ref 70–99)
Potassium: 3.7 mmol/L (ref 3.5–5.1)
Sodium: 136 mmol/L (ref 135–145)
Total Bilirubin: 0.9 mg/dL (ref 0.3–1.2)
Total Protein: 5.7 g/dL — ABNORMAL LOW (ref 6.5–8.1)

## 2020-06-26 LAB — GLUCOSE, CAPILLARY
Glucose-Capillary: 234 mg/dL — ABNORMAL HIGH (ref 70–99)
Glucose-Capillary: 233 mg/dL — ABNORMAL HIGH (ref 70–99)
Glucose-Capillary: 269 mg/dL — ABNORMAL HIGH (ref 70–99)
Glucose-Capillary: 248 mg/dL — ABNORMAL HIGH (ref 70–99)

## 2020-06-26 LAB — HEMOGLOBIN A1C
Hgb A1c MFr Bld: 12.1 % — ABNORMAL HIGH (ref 4.8–5.6)
Mean Plasma Glucose: 301 mg/dL

## 2020-06-26 MED ORDER — INSULIN DETEMIR 100 UNIT/ML ~~LOC~~ SOLN
24.0000 [IU] | Freq: Two times a day (BID) | SUBCUTANEOUS | Status: DC
  Administered 2020-06-26 – 2020-06-29 (×5): 24 [IU] via SUBCUTANEOUS
  Filled 2020-06-26 (×6): qty 0.24

## 2020-06-26 NOTE — Progress Notes (Signed)
PROGRESS NOTE    SURAJ RAMDASS  HEN:[id number] DOB: [birth year] DOA: 06/23/2020 PCP: [name], MD    Brief Narrative:As per H&P written by Dr. Clearence Ped on 06/23/20 [name] a68 y.o.male,with a history of sleep apnea, HLD, DMII, COPD, and cancer who presents to the ED with a chief complaint of Hyperglycemia. Patient is supposed to be on 60units of insulin with supper. He reports that he hasn't taken it in some time because he has not been eating. His wife reports that yesterday he started experiencing polydipsia. He drank some water, but mostly regular gatorade, and dr. Malachi Bonds. Patient reports that he hasn't been checking his glucose. His wife checked it today and the meter read high, so they came to the ER. Patient reports not taking his insulin because he has not been eating. Last normal meal was last week, and wife believes he only had one meal total last week. Patient reports that he suspected his diet was not good for DMII, so he just stopped eating.In addition to this, he has been having increasing dyspnea over the last 2 weeks. He has had associated weakness. He hasn't been able to walk for past two days. Wife has been pushing him in an office chair to the bathroom. He has been incontinent since yesterday, with polyuria that is likely due to the hyperglycemia.He has had a productive cough as well, with yellow sputum. No hemoptysis. He has had no fevers, no sick contacts. Patient is not vaccinated for covid, he reports that he "doesn't trust it."   Of note, patient has had increasing back pain over a couple of months. Patient reports that he had imaging done that showed likely neoplasm, and he has been following with Dr. Raliegh Ip. He is supposed to have a biopsy on 06/25/20. For this reason, he will be admitted to [facility].  ED course T 98.8, P 95, R 33, BP 148/57 PH 7.399, CO2 25, Gap 15, Glucose 895 WBC 10.7 BUN/Cr 35/1.66 CTA = No PE. Primary lung cancer with mets.    Albuterol, aspirin, LR 14ml, famotidine 20mg , insulin drip BNP 39 Assessment & Plan:   Active Problems:   Shortness of breath   Respiratory failure with hypoxia (HCC)   Hyperosmolar hyperglycemic state (HHS) ([place])   Hyperglycemia   Hypoxia   #1 lung mass with widespread meta stasis lymph node biopsy with poorly differentiated carcinoma-patient follows up with oncology at [facility].  Status post biopsy of the left supraclavicular lymph node with poorly differentiated carcinoma.  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  Hypermetabolic LEFT supraclavicular lymph node is large and would be amenable to biopsy. Multifocal hypermetabolic skeletal metastasis involving the ribs, sternum, spine and pelvis. Pathologic fracture involving several of the RIGHT ribs. Ill-defined hypermetabolic lesion in the dome liver consistent with hepatic metastasis.  Nodal metastasis to the prevascular space, LEFTsupraclavicular node, and abdominal periportal/retroperitoneal nodes.  MRI of the brain 06/17/2020 no evidence of intracranial meta stasis, minimal chronic microvascular ischemic changes.  CT angiogram of the chest 06/23/2020-No evidence of pulmonary embolism.  Heterogeneous left apical soft tissue mass with associated areas of cortical destruction involving the first and second left ribs, as well as the adjacent portions of the C7 and T1 vertebral bodies. This is consistent with primary lung malignancy. 2.6 cm x 1.4 cm soft tissue mass along the anterolateral aspect of the  right chest wall, which originates from the fifth right rib. This is concerning for metastatic disease. Multiple ill-defined lytic lesions throughout multiple thoracic spine vertebral bodies and multiple ribs, consistent with osseous metastasis.  4.9 cm x 2.9 cm  area of heterogeneous low attenuation along the medial aspect of the liver dome. This is suspicious for a metastatic lesion.  Mild atelectasis and/or early infiltrate within the left lung base. Evidence of prior cholecystectomy.  MRI of the thoracic and lumbar spine 06/25/2020-Widespread osseous metastases throughout the thoracic and lumbar spine as well as the visualized pelvis, essentially involving all levels. No significant extra osseous or epidural tumor at this time.   Pathologic fracture involving the inferior endplate of T9 with associated mild 20% height loss without bony retropulsion. Partial visualization of patient's known primary mass at the left lung apex, better evaluated on recent chest CT. Early/impending extension of tumor into the left C7-T1 and T1-2 neural foramina without significant stenosis at this time.  Bilateral layering pleural effusions, right greater than left, partially visualized.  Underlying mild multilevel degenerative spondylosis as above. No significant spinal stenosis or neural impingement.    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  #3 acute hypoxic respiratory failure -likely multifactorial COPD and possible primary lung cancer see CT scan of the chest above -patient's pulse ox was 86% and 87% in ER on room air.  Patient does not have oxygen at home.  Currently saturating 93% on 4 L.  Patient has history of severe COPD smoked for 38 years.  Will titrate to keep his sats above 90%. He is not actively wheezing I will hold off on steroids but continue nebulizer treatments.   #4 leukocytosis resolved without any antibiotics  #5 hypokalemia replete and recheck labs in a.m.  #6 goals of care patient is a full code with guarded prognosis, palliative care consulted.  PT consulted.    Nutrition  Problem: Increased nutrient needs Etiology: cancer and cancer related treatments     Signs/Symptoms: estimated needs    Interventions: Ensure Enlive (each supplement provides 350kcal and 20 grams of protein), MVI, Prostat  Estimated body mass index is 43.66 kg/m as calculated from the following:   Height as of this encounter: 6\' 4"  (1.93 m).   Weight as of this encounter: 162.7 kg.  DVT prophylaxis: Heparin/SCD Code Status: Full code Family Communication discussed with patient's wife disposition Plan:  Status is: Inpatient  Dispo: The patient is from: Home              Anticipated d/c is to: Unknown              Anticipated d/c date is: > 3 days              Patient currently is not medically stable to d/c.  Patient sent here from [facility] for biopsy of the lymph node for possible new lung mass.  He is extremely confused and is undergoing further work-up.   Consultants: Interventional radiology  Procedures: None  Antimicrobials: None  Subjective:  Patient seems to be in respiratory distress when I saw him this morning.  He appeared to be using accessory muscles for breathing however he was able to speak in full sentences.  He reported a history of COPD.  He was confused as to where he is.  However he answered some of the questions appropriately. A stat ABG that was sent showed no evidence of hypercapnia. Wife concerned and reports that patient has urinary incontinence which is new Objective: Vitals:   06/25/20 2008 06/26/20 0622 06/26/20 0901 06/26/20 1433  BP: (!) 155/72 (!) 141/55  138/71  Pulse: 82 82  75  Resp: 18 18  (!) 21  Temp: 98.8 F (37.1 C) 97.9 F (36.6 C)  97.8 F (36.6 C)  TempSrc: Oral Oral  Oral  SpO2: 93% 92% 91% 93%  Weight:      Height:        Intake/Output Summary (Last 24 hours) at 06/26/2020 1513 Last data filed at 06/26/2020 1030 Gross per 24 hour  Intake 1402.23 ml  Output 450 ml  Net 952.23 ml   Filed Weights   06/23/20  1513  Weight: (!) 162.7 kg    Examination:  General exam: Appears restless Respiratory system: Few scattered rhonchi to auscultation. Respiratory effort normal. Cardiovascular system: S1 & S2 heard, RRR. No JVD, murmurs, rubs, gallops or clicks. No pedal edema. Gastrointestinal system: Abdomen is nondistended, soft and nontender. No organomegaly or masses felt. Normal bowel sounds heard. Central nervous system: Alert and oriented. No focal neurological deficits. Extremities: He is moving his extremities trace bilateral pitting edema Skin: No rashes, lesions or ulcers Psychiatry: Judgement and insight appear normal. Mood & affect appropriate.     Data Reviewed: I have personally reviewed following labs and imaging studies  CBC: Recent Labs  Lab 06/23/20 1540 06/24/20 0439 06/26/20 0601  WBC 10.7* 15.8* 10.2  NEUTROABS 9.3* 12.4*  --   HGB 10.7* 10.7* 9.9*  HCT 34.1* 32.5* 30.2*  MCV 90.2 84.9 85.6  PLT 348 383 938   Basic Metabolic Panel: Recent Labs  Lab 06/23/20 1540 06/23/20 2322 06/24/20 0439 06/26/20 0601  NA 122* 130* 137 136  K 4.7 3.3* 3.2* 3.7  CL 82* 90* 96* 97*  CO2 25 26 29 28   GLUCOSE 895* 615* 171* 241*  BUN 35* 33* 30* 30*  CREATININE 1.66* 1.50* 1.28* 1.14  CALCIUM 10.3 10.7* 11.0* 10.0  MG  --   --  2.0  --    GFR: Estimated Creatinine Clearance: 102.8 mL/min (by C-G formula based on SCr of 1.14 mg/dL). Liver Function Tests: Recent Labs  Lab 06/23/20 1540 06/24/20 0439 06/26/20 0601  AST 12* 18 26  ALT 19 18 20   ALKPHOS 225* 201* 158*  BILITOT 1.2 0.8 0.9  PROT 7.3 6.9 5.7*  ALBUMIN 3.2* 2.9* 2.5*   No results for input(s): LIPASE, AMYLASE in the last 168 hours. No results for input(s): AMMONIA in the last 168 hours. Coagulation Profile: No results for input(s): INR, PROTIME in the last 168 hours. Cardiac Enzymes: No results for input(s): CKTOTAL, CKMB, CKMBINDEX, TROPONINI in the last 168 hours. BNP (last 3 results) No results  for input(s): PROBNP in the last 8760 hours. HbA1C: Recent Labs    06/24/20 0439  HGBA1C 12.1*   CBG: Recent Labs  Lab 06/25/20 1214 06/25/20 1708 06/25/20 2004 06/26/20 0805 06/26/20 1142  GLUCAP 314* 299* 322* 234* 233*   Lipid Profile: No results for input(s): CHOL, HDL, LDLCALC, TRIG, CHOLHDL, LDLDIRECT in the last 72 hours. Thyroid Function Tests: No results for input(s): TSH, T4TOTAL, FREET4, T3FREE, THYROIDAB in the last 72 hours. Anemia Panel: No results for input(s): VITAMINB12, FOLATE, FERRITIN, TIBC, IRON, RETICCTPCT in the last 72 hours. Sepsis Labs: Recent Labs  Lab 06/23/20 1554  LATICACIDVEN 1.6  Recent Results (from the past 240 hour(s))  SARS Coronavirus 2 by RT PCR (hospital order, performed in [facility] hospital lab) Nasopharyngeal Nasopharyngeal Swab     Status: None   Collection Time: 06/23/20  3:54 PM   Specimen: Nasopharyngeal Swab  Result Value Ref Range Status   SARS Coronavirus 2 NEGATIVE NEGATIVE Final    Comment: (NOTE) SARS-CoV-2 target nucleic acids are NOT DETECTED.  The SARS-CoV-2 RNA is generally detectable in upper and lower respiratory specimens during the acute phase of infection. The lowest concentration of SARS-CoV-2 viral copies this assay can detect is 250 copies / mL. A negative result does not preclude SARS-CoV-2 infection and should not be used as the sole basis for treatment or other patient management decisions.  A negative result may occur with improper specimen collection / handling, submission of specimen other than nasopharyngeal swab, presence of viral mutation(s) within the areas targeted by this assay, and inadequate number of viral copies (<250 copies / mL). A negative result must be combined with clinical observations, patient history, and epidemiological information.  Fact Sheet for Patients:   StrictlyIdeas.no  Fact Sheet for Healthcare  Providers: BankingDealers.co.za  This test is not yet approved or  cleared by the [place] FDA and has been authorized for detection and/or diagnosis of SARS-CoV-2 by FDA under an Emergency Use Authorization (EUA).  This EUA will remain in effect (meaning this test can be used) for the duration of the COVID-19 declaration under Section 564(b)(1) of the Act, 21 U.S.C. section 360bbb-3(b)(1), unless the authorization is terminated or revoked sooner.  Performed at [facility], [address], [place], [place] [address]          Radiology Studies: MR THORACIC SPINE W WO CONTRAST  Result Date: 06/26/2020 CLINICAL DATA:  Initial evaluation for acute back pain, metastatic disease. EXAM: MRI THORACIC AND LUMBAR SPINE WITHOUT AND WITH CONTRAST TECHNIQUE: Multiplanar and multiecho pulse sequences of the thoracic and lumbar spine were obtained without and with intravenous contrast. CONTRAST:  21mL GADAVIST GADOBUTROL 1 MMOL/ML IV SOLN COMPARISON:  Prior PET-CT from 06/13/2020 and chest CT from 06/23/2020 FINDINGS: MRI THORACIC SPINE FINDINGS Alignment: Vertebral bodies normally aligned with preservation of the normal thoracic kyphosis. No listhesis. Vertebrae: Innumerable metastatic lesions seen throughout the thoracic spine, involving essentially all levels. Most prominent involvement seen at the level of T3, where tumor involves the posterior aspect of the T3 vertebral body with extension into the posterior elements bilaterally. The posterior elements of T3 are somewhat expanded without frank extra osseous tumor. No other significant extra osseous or epidural tumor identified. Multifocal rib involvement noted as well, also seen on prior CTs. Pathologic fracture involving the inferior endplate of T9 with associated 20% height loss without bony retropulsion. Otherwise, vertebral body height maintained with no other pathologic fracture identified. Cord: Signal intensity within the  thoracic spinal cord is within normal limits. No cord signal abnormality or abnormal enhancement. No significant epidural tumor at this time. Paraspinal and other soft tissues: Patient's known primary mass positioned at the posterior left lung apex again seen, better evaluated on recent chest CT. Early/impending extension into the left C7-T1 and T1-2 neural foramina. Associated edema within the adjacent paraspinous soft tissues. Paraspinous soft tissues demonstrate no other acute finding. Right greater than left layering bilateral pleural effusions partially visualized. Disc levels: T1-2: Negative interspace. Probable early/impending extension of tumor into the left neural foramen (series 18, image 14). No significant stenosis at this time. T2-3: Unremarkable. T3-4:  Unremarkable. T4-5:  Small right paracentral disc protrusion mildly flattens the right ventral thecal sac. No significant stenosis or cord deformity. Foramina remain patent. T5-6:  Unremarkable. T6-7: Shallow broad-based central to left paracentral disc protrusion flattens the ventral thecal sac. No significant spinal stenosis or cord deformity. Foramina remain patent. T7-8: Minimal disc bulge. Mild prominence of the dorsal epidural fat. No significant stenosis. T8-9: Broad-based central to left paracentral disc protrusion mildly flattens the ventral thecal sac. No significant stenosis or cord deformity. Foramina remain patent. T9-10: Central disc extrusion with superior migration. Flattening of the ventral thecal sac without significant stenosis or cord deformity. Foramina remain patent. T10-11: Negative interspace. Mild posterior element hypertrophy. No stenosis. T11-12:  Unremarkable. T12-L1:  Unremarkable. MRI LUMBAR SPINE FINDINGS Segmentation:  Standard. Alignment:  Physiologic. Vertebrae: Innumerable metastatic lesion seen throughout the visualized lumbar spine and pelvis, essentially involving all levels. For reference purposes, largest lesion is  position within the posterior aspect of the L2 vertebral body and measures up to 2.6 cm. No significant extra osseous or epidural extension of tumor. No associated pathologic fracture or other complication. Conus medullaris: Extends to the L1 level and appears normal. Incidental note made of a small fatty filum terminale. No intracanalicular or epidural tumor. No other abnormal enhancement. Paraspinal and other soft tissues: Paraspinous soft tissues demonstrate no acute finding. Disc levels: L1-2:  Minimal annular disc bulge.  No canal or foraminal stenosis. L2-3: Disc desiccation without significant disc bulge. No stenosis or impingement. L3-4: Disc desiccation without significant disc bulge. Mild facet hypertrophy. No stenosis or impingement. L4-5: Mild intervertebral disc space narrowing with diffuse disc bulge, eccentric to the left. Mild facet and ligament flavum hypertrophy. Borderline mild left lateral recess stenosis. Central canal remains patent. No significant foraminal narrowing. L5-S1: Disc desiccation. Superimposed shallow left foraminal disc protrusion with annular fissure (series 23, image 32). Mild facet hypertrophy. No significant spinal stenosis. Foramina remain patent. IMPRESSION: 1. Widespread osseous metastases throughout the thoracic and lumbar spine as well as the visualized pelvis, essentially involving all levels. No significant extra osseous or epidural tumor at this time. 2. Pathologic fracture involving the inferior endplate of T9 with associated mild 20% height loss without bony retropulsion. 3. Partial visualization of patient's known primary mass at the left lung apex, better evaluated on recent chest CT. Early/impending extension of tumor into the left C7-T1 and T1-2 neural foramina without significant stenosis at this time. 4. Bilateral layering pleural effusions, right greater than left, partially visualized. 5. Underlying mild multilevel degenerative spondylosis as above. No  significant spinal stenosis or neural impingement. Electronically Signed   By: Jeannine Boga M.D.   On: 06/26/2020 00:54   MR Lumbar Spine W Wo Contrast  Result Date: 06/26/2020 CLINICAL DATA:  Initial evaluation for acute back pain, metastatic disease. EXAM: MRI THORACIC AND LUMBAR SPINE WITHOUT AND WITH CONTRAST TECHNIQUE: Multiplanar and multiecho pulse sequences of the thoracic and lumbar spine were obtained without and with intravenous contrast. CONTRAST:  23mL GADAVIST GADOBUTROL 1 MMOL/ML IV SOLN COMPARISON:  Prior PET-CT from 06/13/2020 and chest CT from 06/23/2020 FINDINGS: MRI THORACIC SPINE FINDINGS Alignment: Vertebral bodies normally aligned with preservation of the normal thoracic kyphosis. No listhesis. Vertebrae: Innumerable metastatic lesions seen throughout the thoracic spine, involving essentially all levels. Most prominent involvement seen at the level of T3, where tumor involves the posterior aspect of the T3 vertebral body with extension into the posterior elements bilaterally. The posterior elements of T3 are somewhat expanded without frank extra osseous tumor. No other significant  extra osseous or epidural tumor identified. Multifocal rib involvement noted as well, also seen on prior CTs. Pathologic fracture involving the inferior endplate of T9 with associated 20% height loss without bony retropulsion. Otherwise, vertebral body height maintained with no other pathologic fracture identified. Cord: Signal intensity within the thoracic spinal cord is within normal limits. No cord signal abnormality or abnormal enhancement. No significant epidural tumor at this time. Paraspinal and other soft tissues: Patient's known primary mass positioned at the posterior left lung apex again seen, better evaluated on recent chest CT. Early/impending extension into the left C7-T1 and T1-2 neural foramina. Associated edema within the adjacent paraspinous soft tissues. Paraspinous soft tissues  demonstrate no other acute finding. Right greater than left layering bilateral pleural effusions partially visualized. Disc levels: T1-2: Negative interspace. Probable early/impending extension of tumor into the left neural foramen (series 18, image 14). No significant stenosis at this time. T2-3: Unremarkable. T3-4:  Unremarkable. T4-5: Small right paracentral disc protrusion mildly flattens the right ventral thecal sac. No significant stenosis or cord deformity. Foramina remain patent. T5-6:  Unremarkable. T6-7: Shallow broad-based central to left paracentral disc protrusion flattens the ventral thecal sac. No significant spinal stenosis or cord deformity. Foramina remain patent. T7-8: Minimal disc bulge. Mild prominence of the dorsal epidural fat. No significant stenosis. T8-9: Broad-based central to left paracentral disc protrusion mildly flattens the ventral thecal sac. No significant stenosis or cord deformity. Foramina remain patent. T9-10: Central disc extrusion with superior migration. Flattening of the ventral thecal sac without significant stenosis or cord deformity. Foramina remain patent. T10-11: Negative interspace. Mild posterior element hypertrophy. No stenosis. T11-12:  Unremarkable. T12-L1:  Unremarkable. MRI LUMBAR SPINE FINDINGS Segmentation:  Standard. Alignment:  Physiologic. Vertebrae: Innumerable metastatic lesion seen throughout the visualized lumbar spine and pelvis, essentially involving all levels. For reference purposes, largest lesion is position within the posterior aspect of the L2 vertebral body and measures up to 2.6 cm. No significant extra osseous or epidural extension of tumor. No associated pathologic fracture or other complication. Conus medullaris: Extends to the L1 level and appears normal. Incidental note made of a small fatty filum terminale. No intracanalicular or epidural tumor. No other abnormal enhancement. Paraspinal and other soft tissues: Paraspinous soft tissues  demonstrate no acute finding. Disc levels: L1-2:  Minimal annular disc bulge.  No canal or foraminal stenosis. L2-3: Disc desiccation without significant disc bulge. No stenosis or impingement. L3-4: Disc desiccation without significant disc bulge. Mild facet hypertrophy. No stenosis or impingement. L4-5: Mild intervertebral disc space narrowing with diffuse disc bulge, eccentric to the left. Mild facet and ligament flavum hypertrophy. Borderline mild left lateral recess stenosis. Central canal remains patent. No significant foraminal narrowing. L5-S1: Disc desiccation. Superimposed shallow left foraminal disc protrusion with annular fissure (series 23, image 32). Mild facet hypertrophy. No significant spinal stenosis. Foramina remain patent. IMPRESSION: 1. Widespread osseous metastases throughout the thoracic and lumbar spine as well as the visualized pelvis, essentially involving all levels. No significant extra osseous or epidural tumor at this time. 2. Pathologic fracture involving the inferior endplate of T9 with associated mild 20% height loss without bony retropulsion. 3. Partial visualization of patient's known primary mass at the left lung apex, better evaluated on recent chest CT. Early/impending extension of tumor into the left C7-T1 and T1-2 neural foramina without significant stenosis at this time. 4. Bilateral layering pleural effusions, right greater than left, partially visualized. 5. Underlying mild multilevel degenerative spondylosis as above. No significant spinal stenosis or neural impingement.  Electronically Signed   By: Jeannine Boga M.D.   On: 06/26/2020 00:54   ECHOCARDIOGRAM COMPLETE  Result Date: 06/25/2020    ECHOCARDIOGRAM REPORT   Patient Name:   Gee A Krienke Date of Exam: 06/25/2020 Medical Rec #:  [id number]      Height:       76.0 in Accession #:    [id number]     Weight:       358.7 lb Date of Birth:  09/18/1951      BSA:          2.839 m Patient Age:    78 years       BP:            134/54 mmHg Patient Gender: M              HR:           79 bpm. Exam Location:  Inpatient Procedure: 2D Echo, Cardiac Doppler and Color Doppler Indications:    Abnormal EKG  History:        Patient has prior history of Echocardiogram examinations. COPD,                 Signs/Symptoms:Shortness of Breath and Morbid obesity; Risk                 Factors:Diabetes, Dyslipidemia, Former Smoker, Sleep Apnea and                 Lung cancer. Left apical mass, resp. failure.  Sonographer:    Dustin Flock Referring Phys: [id number] [place] Comments: Patient is morbidly obese. Image acquisition challenging due to uncooperative patient, Image acquisition challenging due to patient body habitus, Image acquisition challenging due to COPD and Lung cancer. IMPRESSIONS  1. Left ventricular ejection fraction, by estimation, is 55 to 60%. The left ventricle has normal function. The left ventricle has no regional wall motion abnormalities. Left ventricular diastolic parameters were normal.  2. Right ventricular systolic function is normal. The right ventricular size is normal.  3. The mitral valve is grossly normal. No evidence of mitral valve regurgitation. No evidence of mitral stenosis.  4. The aortic valve is grossly normal. Aortic valve regurgitation is not visualized. No aortic stenosis is present.  5. The inferior vena cava is normal in size with greater than 50% respiratory variability, suggesting right atrial pressure of 3 mmHg. FINDINGS  Left Ventricle: Left ventricular ejection fraction, by estimation, is 55 to 60%. The left ventricle has normal function. The left ventricle has no regional wall motion abnormalities. The left ventricular internal cavity size was normal in size. There is  no left ventricular hypertrophy. Left ventricular diastolic parameters were normal. Right Ventricle: The right ventricular size is normal. No increase in right ventricular wall thickness. Right  ventricular systolic function is normal. Left Atrium: Left atrial size was normal in size. Right Atrium: Right atrial size was normal in size. Pericardium: There is no evidence of pericardial effusion. Mitral Valve: The mitral valve is grossly normal. No evidence of mitral valve regurgitation. No evidence of mitral valve stenosis. Tricuspid Valve: The tricuspid valve is grossly normal. Tricuspid valve regurgitation is not demonstrated. No evidence of tricuspid stenosis. Aortic Valve: The aortic valve is grossly normal. Aortic valve regurgitation is not visualized. No aortic stenosis is present. Pulmonic Valve: The pulmonic valve was not well visualized. Pulmonic valve regurgitation is not visualized. No evidence of pulmonic stenosis. Aorta: The aortic root is normal in size and  structure. Venous: The inferior vena cava is normal in size with greater than 50% respiratory variability, suggesting right atrial pressure of 3 mmHg. IAS/Shunts: No atrial level shunt detected by color flow Doppler.   Diastology LV e' medial:    5.87 cm/s LV E/e' medial:  11.2 LV e' lateral:   12.10 cm/s LV E/e' lateral: 5.4  RIGHT VENTRICLE RV Basal diam:  3.10 cm RV S prime:     12.30 cm/s TAPSE (M-mode): 3.1 cm LEFT ATRIUM           Index LA Vol (A4C): 49.5 ml 17.43 ml/m  AORTIC VALVE LVOT Vmax:   128.00 cm/s LVOT Vmean:  91.100 cm/s LVOT VTI:    0.291 m MITRAL VALVE MV Area (PHT): 4.15 cm    SHUNTS MV Decel Time: 183 msec    Systemic VTI: 0.29 m MV E velocity: 65.60 cm/s MV A velocity: 78.40 cm/s MV E/A ratio:  0.84 Mihai Croitoru MD Electronically signed by Sanda Klein MD Signature Date/Time: 06/25/2020/2:48:13 PM    Final    [place] CORE BIOPSY (LYMPH NODES)  Result Date: 06/25/2020 INDICATION: Lung mass.  Hypermetabolic enlarged left supraclavicular adenopathy EXAM: ULTRASOUND GUIDED CORE BIOPSY OF LEFT SUPRACLAVICULAR ADENOPATHY MEDICATIONS: lidocaine 1% subcutaneous ANESTHESIA/SEDATION: None required PROCEDURE: The procedure,  risks, benefits, and alternatives were explained to the patient and spouse. Questions regarding the procedure were encouraged and answered. The spouse understands and consents to the procedure. Survey ultrasound of the left supraclavicular region was performed. Hypoechoic adenopathy was localized and an appropriate skin entry site was determined and marked. The operative field was prepped with chlorhexidine in a sterile fashion, and a sterile drape was applied covering the operative field. A sterile gown and sterile gloves were used for the procedure. Local anesthesia was provided with 1% Lidocaine. Under real-time ultrasound guidance, 4 18-gauge core biopsy samples were obtained, submitted in formalin to surgical pathology. The guide needle was removed. Postprocedure scans show no hemorrhage or other apparent complication. The patient tolerated the procedure well. COMPLICATIONS: None immediate. FINDINGS: Left supraclavicular adenopathy was localized. Representative core biopsy samples obtained as above. IMPRESSION: 1. Technically successful left supraclavicular adenopathy core biopsy under ultrasound guidance. Electronically Signed   By: Lucrezia Europe M.D.   On: 06/25/2020 15:37        Scheduled Meds: . (feeding supplement) PROSource Plus  30 mL Oral BID BM  . atorvastatin  80 mg Oral QHS  . diltiazem  240 mg Oral Daily  . feeding supplement (ENSURE ENLIVE)  237 mL Oral BID BM  . gabapentin  300 mg Oral BID  . heparin  5,000 Units Subcutaneous Q8H  . insulin aspart  0-20 Units Subcutaneous TID WC  . insulin aspart  0-5 Units Subcutaneous QHS  . insulin detemir  20 Units Subcutaneous BID  . mometasone-formoterol  2 puff Inhalation BID   And  . umeclidinium bromide  1 puff Inhalation Daily  . multivitamin with minerals  1 tablet Oral Daily  . pantoprazole  40 mg Oral Daily  . sertraline  200 mg Oral Daily   Continuous Infusions: . sodium chloride 75 mL/hr at 06/26/20 1125     LOS: 3 days      Georgette Shell, MD  06/26/2020, 3:13 PM

## 2020-06-26 NOTE — Care Management Important Message (Signed)
Important Message  Patient Details IM Letter given to the Patient Name: Kenneth Patrick MRN: [id number] Date of Birth: [birth year]   Medicare Important Message Given:  Yes     Kerin Salen 06/26/2020, 10:49 AM

## 2020-06-26 NOTE — Evaluation (Signed)
Physical Therapy Evaluation Patient Details Name: Kenneth Patrick MRN: [id number] DOB: 1951-06-30 Today's Date: 06/26/2020   History of Present Illness  69 yo male admitted with weakness, dypsnea, new incontinence. Imagin (+) widespread mets, rib fractures, urinary incontinence. Biopsy 9/21. Hx of lung ca, DM, neuropathy, obesity.  Clinical Impression  On eval, pt required Mod assist to roll to L side, Min assist to roll to R side. Moderate pain with activity but tolerable for pt. Dyspnea 2/4, O2 90% on 4L with activity on today. Pt is very willing to work with therapies during this hospital stay. Wife was present for session. Discussed d/c plan-wife plans to care for pt at home. She would like pt to be able to stand and pivot for transfers, if possible. Will plan to follow pt during this hospital stay and progress activity as tolerated.     Follow Up Recommendations Home health PT;Supervision/Assistance - 24 hour (wife wants to take pt home)    Equipment Recommendations  Hospital bed;Wheelchair;3in1 ;Rolling walker with 5" wheels    Recommendations for Other Services       Precautions / Restrictions Precautions Precautions: Fall Precaution Comments: incontinence, widespread mets, rib fxs Restrictions Weight Bearing Restrictions: No      Mobility  Bed Mobility Overal bed mobility: Needs Assistance Bed Mobility: Rolling Rolling: Mod assist         General bed mobility comments: Mod assist to roll to L side, Min assist to roll to R side. Increased time. Cues for safety, technique. Pain with activity.  Transfers                 General transfer comment: NT on today.  Ambulation/Gait                Stairs            Wheelchair Mobility    Modified Rankin (Stroke Patients Only)       Balance                                             Pertinent Vitals/Pain      Home Living Family/patient expects to be discharged to:: Private  residence Living Arrangements: Spouse/significant other   Type of Home: House Home Access: Stairs to enter   [employer] of Steps: 1 step up into the kitchen from [place] (pt modified so small steps to get up 1 larger step) Home Layout: One level Home Equipment: None      Prior Function Level of Independence: Needs assistance   Gait / Transfers Assistance Needed: ambulatory up until a few days before admission to [facility]. just before admission, able to take a few steps just to get to toilet (few steps from office chair pushed to bathroom)  ADL's / Homemaking Assistance Needed: wife assisting with bathing, dressing  Comments: per wife, she would like pt to return home. Wife and son (temporarily) will help.     Hand Dominance        Extremity/Trunk Assessment   Upper Extremity Assessment Upper Extremity Assessment: Defer to OT evaluation    Lower Extremity Assessment Lower Extremity Assessment:  (R and L LE: hip flex 3-/5, DF/PF 4/5.)    Cervical / Trunk Assessment Cervical / Trunk Assessment: Normal  Communication   Communication: No difficulties  Cognition Arousal/Alertness: Awake/alert Behavior During Therapy: WFL for tasks assessed/performed Overall Cognitive Status:  Within Functional Limits for tasks assessed                                        General Comments      Exercises     Assessment/Plan    PT Assessment Patient needs continued PT services  PT Problem List Decreased strength;Decreased mobility;Decreased activity tolerance;Decreased balance;Decreased knowledge of use of DME;Pain;Impaired sensation;Obesity       PT Treatment Interventions DME instruction;Gait training;Therapeutic activities;Therapeutic exercise;Patient/family education;Balance training;Functional mobility training    PT Goals (Current goals can be found in the Care Plan section)  Acute Rehab PT Goals Patient Stated Goal: home. less pain. PT Goal  Formulation: With patient/family Time For Goal Achievement: 07/10/20 Potential to Achieve Goals: Fair    Frequency Min 3X/week   Barriers to discharge        Co-evaluation               AM-PAC PT "6 Clicks" Mobility  Outcome Measure Help needed turning from your back to your side while in a flat bed without using bedrails?: A Lot Help needed moving from lying on your back to sitting on the side of a flat bed without using bedrails?: A Lot Help needed moving to and from a bed to a chair (including a wheelchair)?: Total Help needed standing up from a chair using your arms (e.g., wheelchair or bedside chair)?: Total Help needed to walk in hospital room?: Total Help needed climbing 3-5 steps with a railing? : Total 6 Click Score: 8    End of Session   Activity Tolerance: Patient tolerated treatment well Patient left: in bed;with call bell/phone within reach;with family/visitor present   PT Visit Diagnosis: Pain;Muscle weakness (generalized) (M62.81);Other symptoms and signs involving the nervous system (R29.898) Pain - part of body:  (neck, L shoulder, back, R side ribs)    Time: 0093-8182 PT Time Calculation (min) (ACUTE ONLY): 35 min   Charges:   PT Evaluation $PT Eval Low Complexity: 1 Low PT Treatments $Therapeutic Activity: 8-22 mins           Doreatha Massed, PT Acute Rehabilitation  Office: [phone] Pager: [phone]

## 2020-06-26 NOTE — Plan of Care (Signed)
Plan of care reviewed and discussed with patient's wife.

## 2020-06-27 DIAGNOSIS — Z7189 Other specified counseling: Secondary | ICD-10-CM

## 2020-06-27 DIAGNOSIS — C3490 Malignant neoplasm of unspecified part of unspecified bronchus or lung: Secondary | ICD-10-CM

## 2020-06-27 DIAGNOSIS — C7951 Secondary malignant neoplasm of bone: Secondary | ICD-10-CM

## 2020-06-27 DIAGNOSIS — Z515 Encounter for palliative care: Secondary | ICD-10-CM

## 2020-06-27 DIAGNOSIS — R591 Generalized enlarged lymph nodes: Secondary | ICD-10-CM

## 2020-06-27 LAB — COMPREHENSIVE METABOLIC PANEL
ALT: 21 U/L (ref 0–44)
AST: 25 U/L (ref 15–41)
Albumin: 2.6 g/dL — ABNORMAL LOW (ref 3.5–5.0)
Alkaline Phosphatase: 184 U/L — ABNORMAL HIGH (ref 38–126)
Anion gap: 11 (ref 5–15)
BUN: 27 mg/dL — ABNORMAL HIGH (ref 8–23)
CO2: 29 mmol/L (ref 22–32)
Calcium: 9.8 mg/dL (ref 8.9–10.3)
Chloride: 94 mmol/L — ABNORMAL LOW (ref 98–111)
Creatinine, Ser: 1.17 mg/dL (ref 0.61–1.24)
GFR calc Af Amer: >60 mL/min (ref >60)
GFR calc non Af Amer: >60 mL/min (ref >60)
Glucose, Bld: 277 mg/dL — ABNORMAL HIGH (ref 70–99)
Potassium: 3.3 mmol/L — ABNORMAL LOW (ref 3.5–5.1)
Sodium: 134 mmol/L — ABNORMAL LOW (ref 135–145)
Total Bilirubin: 1 mg/dL (ref 0.3–1.2)
Total Protein: 6.3 g/dL — ABNORMAL LOW (ref 6.5–8.1)

## 2020-06-27 LAB — GLUCOSE, CAPILLARY
Glucose-Capillary: 275 mg/dL — ABNORMAL HIGH (ref 70–99)
Glucose-Capillary: 227 mg/dL — ABNORMAL HIGH (ref 70–99)
Glucose-Capillary: 275 mg/dL — ABNORMAL HIGH (ref 70–99)
Glucose-Capillary: 334 mg/dL — ABNORMAL HIGH (ref 70–99)

## 2020-06-27 LAB — CBC
HCT: 30.8 % — ABNORMAL LOW (ref 39.0–52.0)
Hemoglobin: 9.8 g/dL — ABNORMAL LOW (ref 13.0–17.0)
MCH: 27.9 pg (ref 26.0–34.0)
MCHC: 31.8 g/dL (ref 30.0–36.0)
MCV: 87.7 fL (ref 80.0–100.0)
Platelets: 306 10*3/uL (ref 150–400)
RBC: 3.51 MIL/uL — ABNORMAL LOW (ref 4.22–5.81)
RDW: 15 % (ref 11.5–15.5)
WBC: 10.1 10*3/uL (ref 4.0–10.5)
nRBC: 0 % (ref 0.0–0.2)

## 2020-06-27 LAB — SURGICAL PATHOLOGY

## 2020-06-27 MED ORDER — GABAPENTIN 300 MG PO CAPS
600.0000 mg | ORAL_CAPSULE | Freq: Two times a day (BID) | ORAL | Status: DC
  Administered 2020-06-27 – 2020-06-29 (×3): 600 mg via ORAL
  Filled 2020-06-27 (×3): qty 2

## 2020-06-27 MED ORDER — GABAPENTIN 400 MG PO CAPS: 400.0000 mg | ORAL_CAPSULE | Freq: Two times a day (BID) | ORAL | Status: DC

## 2020-06-27 NOTE — Progress Notes (Signed)
OT Cancellation Note  Patient Details Name: Kenneth Patrick MRN: [id number] DOB: 05/12/1951   Cancelled Treatment:    Reason Eval/Treat Not Completed: Other (comment) First attempt patient eating breakfast. Second attempt spent 25 mins in room with patient and his son to encourage OOB mobility, educated on use of sara stedy with visual demo however patient stating not today, to try back tomorrow.   Delbert Phenix OT OT pager: [phone]   Rosemary Holms 06/27/2020, 12:45 PM

## 2020-06-27 NOTE — Progress Notes (Addendum)
Inpatient Diabetes Program Recommendations  AACE/ADA: New Consensus Statement on Inpatient Glycemic Control (2015)  Target Ranges:  Prepandial:   less than 140 mg/dL      Peak postprandial:   less than 180 mg/dL (1-2 hours)      Critically ill patients:  140 - 180 mg/dL   Lab Results  Component Value Date   GLUCAP 275 (H) 06/27/2020   HGBA1C 12.1 (H) 06/24/2020    Review of Glycemic Control  Diabetes history: DM2 Outpatient Diabetes medications: U-500 80-80-60 tidwc, Ozempic 0.5 mg weekly Current orders for Inpatient glycemic control: Levemir 24 units bid, Novolog 0-20 units tidwc and hs  HgbA1C - 12.1% CBGs 277, 275 mg/dL  Inpatient Diabetes Program Recommendations:     Increase Levemir to 30 units bid Add Novolog 6 units tidwc if eating > 50% meal.  Will speak with pt regarding his HgbA1C of 12.1%. Will need to f/u with Endo.   Thank you. Lorenda Peck, RD, LDN, CDE Inpatient Diabetes Coordinator [phone]  Addendum - Spoke with pt at bedside about his diabetes control. Discussed importance of f/u with Dr Dorris Fetch about not being on U-500 since poor po intake.  Recommend being discharged on Levemir and Novolog meal coverage (hospital dose) Explained that it was imperative to monitor blood sugars frequently to avoid hypos. Pt appreciative of visit.

## 2020-06-27 NOTE — Progress Notes (Signed)
PROGRESS NOTE    Kenneth Patrick  POE:[id number] DOB: 09-May-1951 DOA: 06/23/2020 PCP: Kenneth Chroman, MD    Brief Narrative:As per H&P written by Dr. Clearence Patrick on 06/23/20 Kenneth Patrick a68 y.o.male,with a history of sleep apnea, HLD, DMII, COPD, and cancer who presents to the ED with a chief complaint of Hyperglycemia. Patient is supposed to be on 60units of insulin with supper. He reports that he hasn't taken it in some time because he has not been eating. His wife reports that yesterday he started experiencing polydipsia. He drank some water, but mostly regular gatorade, and dr. Malachi Patrick. Patient reports that he hasn't been checking his glucose. His wife checked it today and the meter read high, so they came to the ER. Patient reports not taking his insulin because he has not been eating. Last normal meal was last week, and wife believes he only had one meal total last week. Patient reports that he suspected his diet was not good for DMII, so he just stopped eating.In addition to this, he has been having increasing dyspnea over the last 2 weeks. He has had associated weakness. He hasn't been able to walk for past two days. Wife has been pushing him in an office chair to the bathroom. He has been incontinent since yesterday, with polyuria that is likely due to the hyperglycemia.He has had a productive cough as well, with yellow sputum. No hemoptysis. He has had no fevers, no sick contacts. Patient is not vaccinated for covid, he reports that he "doesn't trust it."   Of note, patient has had increasing back pain over a couple of months. Patient reports that he had imaging done that showed likely neoplasm, and he has been following with Kenneth Patrick. He is supposed to have a biopsy on 06/25/20. For this reason, he will be admitted to [facility] long.  ED course T 98.8, P 95, R 33, BP 148/57 PH 7.399, CO2 25, Gap 15, Glucose 895 WBC 10.7 BUN/Cr 35/1.66 CTA = No PE. Primary lung cancer with mets.    Albuterol, aspirin, LR 108ml, famotidine 20mg , insulin drip BNP 39 Assessment & Plan:   Active Problems:   Shortness of breath   Respiratory failure with hypoxia (HCC)   Hyperosmolar hyperglycemic state (HHS) ([place])   Hyperglycemia   Hypoxia   #1 lung mass with widespread meta stasis lymph node biopsy with poorly differentiated carcinoma-patient follows up with oncology at [place] center.  Status post biopsy of the left supraclavicular lymph node with poorly differentiated carcinoma.  Work-up so far includes--   PET scan 06/13/2020-Hypermetabolic LEFT apical mass extending to the chest wall and adjacent first rib most consistent with bronchogenic carcinoma (Kenneth Patrick tumor).  Hypermetabolic LEFT supraclavicular lymph node is large and would be amenable to biopsy. Multifocal hypermetabolic skeletal metastasis involving the ribs, sternum, spine and pelvis. Pathologic fracture involving several of the RIGHT ribs. Ill-defined hypermetabolic lesion in the dome liver consistent with hepatic metastasis.  Nodal metastasis to the prevascular space, LEFTsupraclavicular node, and abdominal periportal/retroperitoneal nodes.  MRI of the brain 06/17/2020 no evidence of intracranial meta stasis, minimal chronic microvascular ischemic changes.  CT angiogram of the chest 06/23/2020-No evidence of pulmonary embolism.  Heterogeneous left apical soft tissue mass with associated areas of cortical destruction involving the first and second left ribs, as well as the adjacent portions of the C7 and T1 vertebral bodies. This is consistent with primary lung malignancy. 2.6 cm x 1.4 cm soft tissue mass along the anterolateral aspect of the  right chest wall, which originates from the fifth right rib. This is concerning for metastatic disease. Multiple ill-defined lytic lesions throughout multiple thoracic spine vertebral bodies and multiple ribs, consistent with osseous metastasis.  4.9 cm x 2.9 cm  area of heterogeneous low attenuation along the medial aspect of the liver dome. This is suspicious for a metastatic lesion.  Mild atelectasis and/or early infiltrate within the left lung base. Evidence of prior cholecystectomy.  MRI of the thoracic and lumbar spine 06/25/2020-Widespread osseous metastases throughout the thoracic and lumbar spine as well as the visualized pelvis, essentially involving all levels. No significant extra osseous or epidural tumor at this time.   Pathologic fracture involving the inferior endplate of T9 with associated mild 20% height loss without bony retropulsion. Partial visualization of patient's known primary mass at the left lung apex, better evaluated on recent chest CT. Early/impending extension of tumor into the left C7-T1 and T1-2 neural foramina without significant stenosis at this time.  Bilateral layering pleural effusions, right greater than left, partially visualized.  Underlying mild multilevel degenerative spondylosis as above. No significant spinal stenosis or neural impingement.  Patient's family is waiting to speak with palliative care I have discussed with her about hospice and wife does know his prognosis is very poor and is less than 6 months.  She would appreciate any help we can provide for her and him to take him home tomorrow.  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  #3 acute hypoxic respiratory failure -likely multifactorial COPD and possible primary lung cancer see CT scan of the chest above -patient's pulse ox was 86% and 87% in ER on room air.  Patient does not have oxygen at home.  Currently saturating 93% on 4 L.  Patient has history of severe COPD smoked for 38 years.  Will titrate to keep his sats above 90%. He is not actively wheezing I will hold off on steroids but  continue nebulizer treatments.   #4 leukocytosis resolved without any antibiotics  #5 hypokalemia replete and recheck labs in a.m.  #6 goals of care patient is a full code with guarded prognosis, palliative care consulted.  PT consulted.    Nutrition Problem: Increased nutrient needs Etiology: cancer and cancer related treatments     Signs/Symptoms: estimated needs    Interventions: Ensure Enlive (each supplement provides 350kcal and 20 grams of protein), MVI, Prostat  Estimated body mass index is 43.66 kg/m as calculated from the following:   Height as of this encounter: 6\' 4"  (1.93 m).   Weight as of this encounter: 162.7 kg.  DVT prophylaxis: Heparin/SCD Code Status: Full code Family Communication discussed with patient's wife disposition Plan:  Status is: Inpatient  Dispo: The patient is from: Home              Anticipated d/c is to: Unknown              Anticipated d/c date is: > 3 days              Patient currently is not medically stable to d/c.  Patient sent here from [facility] for biopsy of the lymph node for possible new lung mass.  He is extremely confused and is undergoing further work-up.   Consultants: Interventional radiology  Procedures: None  Antimicrobials: None  Subjective:  He  is very tired and sleeping able to wake him up but cannot keep his eyes open  No family at bedside this morning  No events overnight   objective: Vitals:   06/26/20 1923 06/26/20 2035 06/27/20 0526 06/27/20 0939  BP:  138/69 (!) 147/55   Pulse:  80 79   Resp:  (!) 22 20   Temp:  97.9 F (36.6 C) 98 F (36.7 C)   TempSrc:  Oral Oral   SpO2: 93% 94% 92% 90%  Weight:      Height:        Intake/Output Summary (Last 24 hours) at 06/27/2020 1056 Last data filed at 06/27/2020 0554 Gross per 24 hour  Intake --  Output 700 ml  Net -700 ml   Filed Weights   06/23/20 1513  Weight: (!) 162.7 kg    Examination:  General exam: Appears  restless Respiratory system: Few scattered rhonchi to auscultation. Respiratory effort normal. Cardiovascular system: S1 & S2 heard, RRR. No JVD, murmurs, rubs, gallops or clicks. No pedal edema. Gastrointestinal system: Abdomen is nondistended, soft and nontender. No organomegaly or masses felt. Normal bowel sounds heard. Central nervous system: Alert and oriented. No focal neurological deficits. Extremities: He is moving his extremities trace bilateral pitting edema Skin: No rashes, lesions or ulcers Psychiatry: Judgement and insight appear normal. Mood & affect appropriate.     Data Reviewed: I have personally reviewed following labs and imaging studies  CBC: Recent Labs  Lab 06/23/20 1540 06/24/20 0439 06/26/20 0601 06/27/20 0548  WBC 10.7* 15.8* 10.2 10.1  NEUTROABS 9.3* 12.4*  --   --   HGB 10.7* 10.7* 9.9* 9.8*  HCT 34.1* 32.5* 30.2* 30.8*  MCV 90.2 84.9 85.6 87.7  PLT 348 383 324 559   Basic Metabolic Panel: Recent Labs  Lab 06/23/20 1540 06/23/20 2322 06/24/20 0439 06/26/20 0601 06/27/20 0548  NA 122* 130* 137 136 134*  K 4.7 3.3* 3.2* 3.7 3.3*  CL 82* 90* 96* 97* 94*  CO2 25 26 29 28 29   GLUCOSE 895* 615* 171* 241* 277*  BUN 35* 33* 30* 30* 27*  CREATININE 1.66* 1.50* 1.28* 1.14 1.17  CALCIUM 10.3 10.7* 11.0* 10.0 9.8  MG  --   --  2.0  --   --    GFR: Estimated Creatinine Clearance: 100.2 mL/min (by C-G formula based on SCr of 1.17 mg/dL). Liver Function Tests: Recent Labs  Lab 06/23/20 1540 06/24/20 0439 06/26/20 0601 06/27/20 0548  AST 12* 18 26 25   ALT 19 18 20 21   ALKPHOS 225* 201* 158* 184*  BILITOT 1.2 0.8 0.9 1.0  PROT 7.3 6.9 5.7* 6.3*  ALBUMIN 3.2* 2.9* 2.5* 2.6*   No results for input(s): LIPASE, AMYLASE in the last 168 hours. No results for input(s): AMMONIA in the last 168 hours. Coagulation Profile: No results for input(s): INR, PROTIME in the last 168 hours. Cardiac Enzymes: No results for input(s): CKTOTAL, CKMB, CKMBINDEX,  TROPONINI in the last 168 hours. BNP (last 3 results) No results for input(s): PROBNP in the last 8760 hours. HbA1C: No results for input(s): HGBA1C in the last 72 hours. CBG: Recent Labs  Lab 06/26/20 0805 06/26/20 1142 06/26/20 1706 06/26/20 2032 06/27/20 0735  GLUCAP 234* 233* 269* 248* 275*   Lipid Profile: No results for input(s): CHOL, HDL, LDLCALC, TRIG, CHOLHDL, LDLDIRECT in the last 72 hours. Thyroid Function Tests: No results for input(s): TSH, T4TOTAL, FREET4, T3FREE, THYROIDAB in the last 72 hours. Anemia Panel: No results for input(s):  VITAMINB12, FOLATE, FERRITIN, TIBC, IRON, RETICCTPCT in the last 72 hours. Sepsis Labs: Recent Labs  Lab 06/23/20 1554  LATICACIDVEN 1.6    Recent Results (from the past 240 hour(s))  SARS Coronavirus 2 by RT PCR (hospital order, performed in [facility] hospital lab) Nasopharyngeal Nasopharyngeal Swab     Status: None   Collection Time: 06/23/20  3:54 PM   Specimen: Nasopharyngeal Swab  Result Value Ref Range Status   SARS Coronavirus 2 NEGATIVE NEGATIVE Final    Comment: (NOTE) SARS-CoV-2 target nucleic acids are NOT DETECTED.  The SARS-CoV-2 RNA is generally detectable in upper and lower respiratory specimens during the acute phase of infection. The lowest concentration of SARS-CoV-2 viral copies this assay can detect is 250 copies / mL. A negative result does not preclude SARS-CoV-2 infection and should not be used as the sole basis for treatment or other patient management decisions.  A negative result may occur with improper specimen collection / handling, submission of specimen other than nasopharyngeal swab, presence of viral mutation(s) within the areas targeted by this assay, and inadequate number of viral copies (<250 copies / mL). A negative result must be combined with clinical observations, patient history, and epidemiological information.  Fact Sheet for Patients:    StrictlyIdeas.no  Fact Sheet for Healthcare Providers: BankingDealers.co.za  This test is not yet approved or  cleared by the [place] FDA and has been authorized for detection and/or diagnosis of SARS-CoV-2 by FDA under an Emergency Use Authorization (EUA).  This EUA will remain in effect (meaning this test can be used) for the duration of the COVID-19 declaration under Section 564(b)(1) of the Act, 21 U.S.C. section 360bbb-3(b)(1), unless the authorization is terminated or revoked sooner.  Performed at [facility], [address] [place], [place] [address]          Radiology Studies: MR THORACIC SPINE W WO CONTRAST  Result Date: 06/26/2020 CLINICAL DATA:  Initial evaluation for acute back pain, metastatic disease. EXAM: MRI THORACIC AND LUMBAR SPINE WITHOUT AND WITH CONTRAST TECHNIQUE: Multiplanar and multiecho pulse sequences of the thoracic and lumbar spine were obtained without and with intravenous contrast. CONTRAST:  58mL GADAVIST GADOBUTROL 1 MMOL/ML IV SOLN COMPARISON:  Prior PET-CT from 06/13/2020 and chest CT from 06/23/2020 FINDINGS: MRI THORACIC SPINE FINDINGS Alignment: Vertebral bodies normally aligned with preservation of the normal thoracic kyphosis. No listhesis. Vertebrae: Innumerable metastatic lesions seen throughout the thoracic spine, involving essentially all levels. Most prominent involvement seen at the level of T3, where tumor involves the posterior aspect of the T3 vertebral body with extension into the posterior elements bilaterally. The posterior elements of T3 are somewhat expanded without frank extra osseous tumor. No other significant extra osseous or epidural tumor identified. Multifocal rib involvement noted as well, also seen on prior CTs. Pathologic fracture involving the inferior endplate of T9 with associated 20% height loss without bony retropulsion. Otherwise, vertebral body height maintained with  no other pathologic fracture identified. Cord: Signal intensity within the thoracic spinal cord is within normal limits. No cord signal abnormality or abnormal enhancement. No significant epidural tumor at this time. Paraspinal and other soft tissues: Patient's known primary mass positioned at the posterior left lung apex again seen, better evaluated on recent chest CT. Early/impending extension into the left C7-T1 and T1-2 neural foramina. Associated edema within the adjacent paraspinous soft tissues. Paraspinous soft tissues demonstrate no other acute finding. Right greater than left layering bilateral pleural effusions partially visualized. Disc levels: T1-2: Negative interspace. Probable  early/impending extension of tumor into the left neural foramen (series 18, image 14). No significant stenosis at this time. T2-3: Unremarkable. T3-4:  Unremarkable. T4-5: Small right paracentral disc protrusion mildly flattens the right ventral thecal sac. No significant stenosis or cord deformity. Foramina remain patent. T5-6:  Unremarkable. T6-7: Shallow broad-based central to left paracentral disc protrusion flattens the ventral thecal sac. No significant spinal stenosis or cord deformity. Foramina remain patent. T7-8: Minimal disc bulge. Mild prominence of the dorsal epidural fat. No significant stenosis. T8-9: Broad-based central to left paracentral disc protrusion mildly flattens the ventral thecal sac. No significant stenosis or cord deformity. Foramina remain patent. T9-10: Central disc extrusion with superior migration. Flattening of the ventral thecal sac without significant stenosis or cord deformity. Foramina remain patent. T10-11: Negative interspace. Mild posterior element hypertrophy. No stenosis. T11-12:  Unremarkable. T12-L1:  Unremarkable. MRI LUMBAR SPINE FINDINGS Segmentation:  Standard. Alignment:  Physiologic. Vertebrae: Innumerable metastatic lesion seen throughout the visualized lumbar spine and pelvis,  essentially involving all levels. For reference purposes, largest lesion is position within the posterior aspect of the L2 vertebral body and measures up to 2.6 cm. No significant extra osseous or epidural extension of tumor. No associated pathologic fracture or other complication. Conus medullaris: Extends to the L1 level and appears normal. Incidental note made of a small fatty filum terminale. No intracanalicular or epidural tumor. No other abnormal enhancement. Paraspinal and other soft tissues: Paraspinous soft tissues demonstrate no acute finding. Disc levels: L1-2:  Minimal annular disc bulge.  No canal or foraminal stenosis. L2-3: Disc desiccation without significant disc bulge. No stenosis or impingement. L3-4: Disc desiccation without significant disc bulge. Mild facet hypertrophy. No stenosis or impingement. L4-5: Mild intervertebral disc space narrowing with diffuse disc bulge, eccentric to the left. Mild facet and ligament flavum hypertrophy. Borderline mild left lateral recess stenosis. Central canal remains patent. No significant foraminal narrowing. L5-S1: Disc desiccation. Superimposed shallow left foraminal disc protrusion with annular fissure (series 23, image 32). Mild facet hypertrophy. No significant spinal stenosis. Foramina remain patent. IMPRESSION: 1. Widespread osseous metastases throughout the thoracic and lumbar spine as well as the visualized pelvis, essentially involving all levels. No significant extra osseous or epidural tumor at this time. 2. Pathologic fracture involving the inferior endplate of T9 with associated mild 20% height loss without bony retropulsion. 3. Partial visualization of patient's known primary mass at the left lung apex, better evaluated on recent chest CT. Early/impending extension of tumor into the left C7-T1 and T1-2 neural foramina without significant stenosis at this time. 4. Bilateral layering pleural effusions, right greater than left, partially  visualized. 5. Underlying mild multilevel degenerative spondylosis as above. No significant spinal stenosis or neural impingement. Electronically Signed   By: Jeannine Boga M.D.   On: 06/26/2020 00:54   MR Lumbar Spine W Wo Contrast  Result Date: 06/26/2020 CLINICAL DATA:  Initial evaluation for acute back pain, metastatic disease. EXAM: MRI THORACIC AND LUMBAR SPINE WITHOUT AND WITH CONTRAST TECHNIQUE: Multiplanar and multiecho pulse sequences of the thoracic and lumbar spine were obtained without and with intravenous contrast. CONTRAST:  65mL GADAVIST GADOBUTROL 1 MMOL/ML IV SOLN COMPARISON:  Prior PET-CT from 06/13/2020 and chest CT from 06/23/2020 FINDINGS: MRI THORACIC SPINE FINDINGS Alignment: Vertebral bodies normally aligned with preservation of the normal thoracic kyphosis. No listhesis. Vertebrae: Innumerable metastatic lesions seen throughout the thoracic spine, involving essentially all levels. Most prominent involvement seen at the level of T3, where tumor involves the posterior aspect of the T3  vertebral body with extension into the posterior elements bilaterally. The posterior elements of T3 are somewhat expanded without frank extra osseous tumor. No other significant extra osseous or epidural tumor identified. Multifocal rib involvement noted as well, also seen on prior CTs. Pathologic fracture involving the inferior endplate of T9 with associated 20% height loss without bony retropulsion. Otherwise, vertebral body height maintained with no other pathologic fracture identified. Cord: Signal intensity within the thoracic spinal cord is within normal limits. No cord signal abnormality or abnormal enhancement. No significant epidural tumor at this time. Paraspinal and other soft tissues: Patient's known primary mass positioned at the posterior left lung apex again seen, better evaluated on recent chest CT. Early/impending extension into the left C7-T1 and T1-2 neural foramina. Associated  edema within the adjacent paraspinous soft tissues. Paraspinous soft tissues demonstrate no other acute finding. Right greater than left layering bilateral pleural effusions partially visualized. Disc levels: T1-2: Negative interspace. Probable early/impending extension of tumor into the left neural foramen (series 18, image 14). No significant stenosis at this time. T2-3: Unremarkable. T3-4:  Unremarkable. T4-5: Small right paracentral disc protrusion mildly flattens the right ventral thecal sac. No significant stenosis or cord deformity. Foramina remain patent. T5-6:  Unremarkable. T6-7: Shallow broad-based central to left paracentral disc protrusion flattens the ventral thecal sac. No significant spinal stenosis or cord deformity. Foramina remain patent. T7-8: Minimal disc bulge. Mild prominence of the dorsal epidural fat. No significant stenosis. T8-9: Broad-based central to left paracentral disc protrusion mildly flattens the ventral thecal sac. No significant stenosis or cord deformity. Foramina remain patent. T9-10: Central disc extrusion with superior migration. Flattening of the ventral thecal sac without significant stenosis or cord deformity. Foramina remain patent. T10-11: Negative interspace. Mild posterior element hypertrophy. No stenosis. T11-12:  Unremarkable. T12-L1:  Unremarkable. MRI LUMBAR SPINE FINDINGS Segmentation:  Standard. Alignment:  Physiologic. Vertebrae: Innumerable metastatic lesion seen throughout the visualized lumbar spine and pelvis, essentially involving all levels. For reference purposes, largest lesion is position within the posterior aspect of the L2 vertebral body and measures up to 2.6 cm. No significant extra osseous or epidural extension of tumor. No associated pathologic fracture or other complication. Conus medullaris: Extends to the L1 level and appears normal. Incidental note made of a small fatty filum terminale. No intracanalicular or epidural tumor. No other abnormal  enhancement. Paraspinal and other soft tissues: Paraspinous soft tissues demonstrate no acute finding. Disc levels: L1-2:  Minimal annular disc bulge.  No canal or foraminal stenosis. L2-3: Disc desiccation without significant disc bulge. No stenosis or impingement. L3-4: Disc desiccation without significant disc bulge. Mild facet hypertrophy. No stenosis or impingement. L4-5: Mild intervertebral disc space narrowing with diffuse disc bulge, eccentric to the left. Mild facet and ligament flavum hypertrophy. Borderline mild left lateral recess stenosis. Central canal remains patent. No significant foraminal narrowing. L5-S1: Disc desiccation. Superimposed shallow left foraminal disc protrusion with annular fissure (series 23, image 32). Mild facet hypertrophy. No significant spinal stenosis. Foramina remain patent. IMPRESSION: 1. Widespread osseous metastases throughout the thoracic and lumbar spine as well as the visualized pelvis, essentially involving all levels. No significant extra osseous or epidural tumor at this time. 2. Pathologic fracture involving the inferior endplate of T9 with associated mild 20% height loss without bony retropulsion. 3. Partial visualization of patient's known primary mass at the left lung apex, better evaluated on recent chest CT. Early/impending extension of tumor into the left C7-T1 and T1-2 neural foramina without significant stenosis at this time. 4.  Bilateral layering pleural effusions, right greater than left, partially visualized. 5. Underlying mild multilevel degenerative spondylosis as above. No significant spinal stenosis or neural impingement. Electronically Signed   By: Jeannine Boga M.D.   On: 06/26/2020 00:54   ECHOCARDIOGRAM COMPLETE  Result Date: 06/25/2020    ECHOCARDIOGRAM REPORT   Patient Name:   Kenneth Patrick Date of Exam: 06/25/2020 Medical Rec #:  [id number]      Height:       76.0 in Accession #:    [id number]     Weight:       358.7 lb Date of Birth:   1951/09/22      BSA:          2.839 m Patient Age:    45 years       BP:           134/54 mmHg Patient Gender: M              HR:           79 bpm. Exam Location:  Inpatient Procedure: 2D Echo, Cardiac Doppler and Color Doppler Indications:    Abnormal EKG  History:        Patient has prior history of Echocardiogram examinations. COPD,                 Signs/Symptoms:Shortness of Breath and Morbid obesity; Risk                 Factors:Diabetes, Dyslipidemia, Former Smoker, Sleep Apnea and                 Lung cancer. Left apical mass, resp. failure.  Sonographer:    Dustin Flock Referring Phys: [id number] [place] Comments: Patient is morbidly obese. Image acquisition challenging due to uncooperative patient, Image acquisition challenging due to patient body habitus, Image acquisition challenging due to COPD and Lung cancer. IMPRESSIONS  1. Left ventricular ejection fraction, by estimation, is 55 to 60%. The left ventricle has normal function. The left ventricle has no regional wall motion abnormalities. Left ventricular diastolic parameters were normal.  2. Right ventricular systolic function is normal. The right ventricular size is normal.  3. The mitral valve is grossly normal. No evidence of mitral valve regurgitation. No evidence of mitral stenosis.  4. The aortic valve is grossly normal. Aortic valve regurgitation is not visualized. No aortic stenosis is present.  5. The inferior vena cava is normal in size with greater than 50% respiratory variability, suggesting right atrial pressure of 3 mmHg. FINDINGS  Left Ventricle: Left ventricular ejection fraction, by estimation, is 55 to 60%. The left ventricle has normal function. The left ventricle has no regional wall motion abnormalities. The left ventricular internal cavity size was normal in size. There is  no left ventricular hypertrophy. Left ventricular diastolic parameters were normal. Right Ventricle: The right ventricular size is  normal. No increase in right ventricular wall thickness. Right ventricular systolic function is normal. Left Atrium: Left atrial size was normal in size. Right Atrium: Right atrial size was normal in size. Pericardium: There is no evidence of pericardial effusion. Mitral Valve: The mitral valve is grossly normal. No evidence of mitral valve regurgitation. No evidence of mitral valve stenosis. Tricuspid Valve: The tricuspid valve is grossly normal. Tricuspid valve regurgitation is not demonstrated. No evidence of tricuspid stenosis. Aortic Valve: The aortic valve is grossly normal. Aortic valve regurgitation is not visualized. No aortic stenosis is present. Pulmonic Valve: The pulmonic  valve was not well visualized. Pulmonic valve regurgitation is not visualized. No evidence of pulmonic stenosis. Aorta: The aortic root is normal in size and structure. Venous: The inferior vena cava is normal in size with greater than 50% respiratory variability, suggesting right atrial pressure of 3 mmHg. IAS/Shunts: No atrial level shunt detected by color flow Doppler.   Diastology LV e' medial:    5.87 cm/s LV E/e' medial:  11.2 LV e' lateral:   12.10 cm/s LV E/e' lateral: 5.4  RIGHT VENTRICLE RV Basal diam:  3.10 cm RV S prime:     12.30 cm/s TAPSE (M-mode): 3.1 cm LEFT ATRIUM           Index LA Vol (A4C): 49.5 ml 17.43 ml/m  AORTIC VALVE LVOT Vmax:   128.00 cm/s LVOT Vmean:  91.100 cm/s LVOT VTI:    0.291 m MITRAL VALVE MV Area (PHT): 4.15 cm    SHUNTS MV Decel Time: 183 msec    Systemic VTI: 0.29 m MV E velocity: 65.60 cm/s MV A velocity: 78.40 cm/s MV E/A ratio:  0.84 Mihai Croitoru MD Electronically signed by Sanda Klein MD Signature Date/Time: 06/25/2020/2:48:13 PM    Final    [place] CORE BIOPSY (LYMPH NODES)  Result Date: 06/25/2020 INDICATION: Lung mass.  Hypermetabolic enlarged left supraclavicular adenopathy EXAM: ULTRASOUND GUIDED CORE BIOPSY OF LEFT SUPRACLAVICULAR ADENOPATHY MEDICATIONS: lidocaine 1% subcutaneous  ANESTHESIA/SEDATION: None required PROCEDURE: The procedure, risks, benefits, and alternatives were explained to the patient and spouse. Questions regarding the procedure were encouraged and answered. The spouse understands and consents to the procedure. Survey ultrasound of the left supraclavicular region was performed. Hypoechoic adenopathy was localized and an appropriate skin entry site was determined and marked. The operative field was prepped with chlorhexidine in a sterile fashion, and a sterile drape was applied covering the operative field. A sterile gown and sterile gloves were used for the procedure. Local anesthesia was provided with 1% Lidocaine. Under real-time ultrasound guidance, 4 18-gauge core biopsy samples were obtained, submitted in formalin to surgical pathology. The guide needle was removed. Postprocedure scans show no hemorrhage or other apparent complication. The patient tolerated the procedure well. COMPLICATIONS: None immediate. FINDINGS: Left supraclavicular adenopathy was localized. Representative core biopsy samples obtained as above. IMPRESSION: 1. Technically successful left supraclavicular adenopathy core biopsy under ultrasound guidance. Electronically Signed   By: Lucrezia Europe M.D.   On: 06/25/2020 15:37        Scheduled Meds: . (feeding supplement) PROSource Plus  30 mL Oral BID BM  . atorvastatin  80 mg Oral QHS  . diltiazem  240 mg Oral Daily  . feeding supplement (ENSURE ENLIVE)  237 mL Oral BID BM  . gabapentin  300 mg Oral BID  . heparin  5,000 Units Subcutaneous Q8H  . insulin aspart  0-20 Units Subcutaneous TID WC  . insulin aspart  0-5 Units Subcutaneous QHS  . insulin detemir  24 Units Subcutaneous BID  . mometasone-formoterol  2 puff Inhalation BID   And  . umeclidinium bromide  1 puff Inhalation Daily  . multivitamin with minerals  1 tablet Oral Daily  . pantoprazole  40 mg Oral Daily  . sertraline  200 mg Oral Daily   Continuous Infusions:     LOS: 4 days     Georgette Shell, MD  06/27/2020, 10:56 AM

## 2020-06-27 NOTE — Consult Note (Signed)
Consultation Note Date: 06/27/2020   Patient Name: Kenneth Patrick  DOB: [birth year]  MRN: [id number]  Age / Sex: 69 y.o., male  PCP: [name], MD Referring Physician: Georgette Shell, MD  Reason for Consultation: Establishing goals of care and Pain control  HPI/Patient Profile: 69 y.o. male  with past medical history of sleep apnea, hyperlipidemia, diabetes, COPD, cancer admitted on 06/23/2020 with hyperglycemia and has been holding his insulin as he has not been eating well but was drinking. Undergoing work up for newly suspected cancer with Dr. Delton Coombes with plans for biopsy 06/25/20. PET scan completed 06/13/20 with evidence of hypermetabolic areas in left apical mass extending to chest wall and first rib, left supraclavicular lymph node, skeletal mets to ribs/sternum/spine/pelvis, liver lesion, abdominal periportal/retroperitoneal nodes. Also with underlying weakness.   Clinical Assessment and Goals of Care: I met today at [facility] bedside along with his son Ronalee Belts as well. We had a long discussion regarding his health and concern for the extent of his cancer and functional decline. We reviewed his diagnostics (PET and MRIs) and areas of concern throughout lung, bone, liver, and lymph nodes. We discussed that this is very extensive and advanced. We discussed that there will be serious questions moving forward about his wishes and IF there are treatment options the risks vs benefits and what his priority is for comfort vs prolonging life. Also he is extremely weak and requiring assistance even with movement in bed at times and actually getting to doctor appointments could be extremely difficult.   We did discuss the option of hospice at home and they are leaning towards this option as he wishes to return back home. They recognize that they will need much equipment and support to care for him at home. We also  discussed code status and he is also leaning more towards DNR but wishes to discuss with his wife as well.   We also spent some time discussing his pain. He complains mainly of neuropathic pain down right head and down left neck to arm. Discussed option to increase gabapentin and he agrees. He does get some relief from hydrocodone as well.   All questions/concerns addressed. Emotional support provided.   Primary Decision Maker PATIENT    SUMMARY OF RECOMMENDATIONS   - Leaning towards consideration of hospice at home - Discussing with family code status wishes  Code Status/Advance Care Planning:  Full code   Symptom Management:   Neuropathic pain: He tells me that he gets some good relief at home from 2 gabapentin (600 mg) and tolerates. Will increase to 600 mg twice daily and monitor tolerance. Continue hydrocodone as needed as well.   Palliative Prophylaxis:   Bowel Regimen, Delirium Protocol, Frequent Pain Assessment and Turn Reposition  Prognosis:   Overall prognosis poor with metastatic cancer and declining functional status.   Discharge Planning: Likely home with hospice.      Primary Diagnoses: Present on Admission: . Respiratory failure with hypoxia ([place]) . Hyperosmolar hyperglycemic state (HHS) ([place])   I  have reviewed the medical record, interviewed the patient and family, and examined the patient. The following aspects are pertinent.  Past Medical History:  Diagnosis Date  . Cancer ([place])    basal cell   . COPD (chronic obstructive pulmonary disease) (H[facility])    uses inhalers  . Depression   . Diabetic neuropathy ([place])   . DM2 (diabetes mellitus, type 2) (HCC)    insulin dependent  . Dyspnea    with exertion  . Hypercholesterolemia   . Low back pain    no meds  . Respiratory infection   . Sleep apnea    uses CPAP   Social History   Socioeconomic History  . Marital status: Married    Spouse name: Not on file  . Number of children: 2  . Years of  education: Not on file  . Highest education level: Not on file  Occupational History  . Occupation: retired Engineer, structural  Tobacco Use  . Smoking status: Former Smoker    Packs/day: 1.50    Years: 20.00    Pack years: 30.00    Types: Cigarettes    Start date: 52    Quit date: 10/05/2004    Years since quitting: 15.7  . Smokeless tobacco: Never Used  Vaping Use  . Vaping Use: Never used  Substance and Sexual Activity  . Alcohol use: No    Alcohol/week: 0.0 standard drinks  . Drug use: No    Comment: stopped smoking marijuana in 2006  . Sexual activity: Not on file  Other Topics Concern  . Not on file  Social History Narrative  . Not on file   Social Determinants of Health   Financial Resource Strain: Low Risk   . Difficulty of Paying Living Expenses: Not hard at all  Food Insecurity: No Food Insecurity  . Worried About Charity fundraiser in the Last Year: Never true  . Ran Out of Food in the Last Year: Never true  Transportation Needs: No Transportation Needs  . Lack of Transportation (Medical): No  . Lack of Transportation (Non-Medical): No  Physical Activity: Inactive  . Days of Exercise per Week: 0 days  . Minutes of Exercise per Session: 0 min  Stress: No Stress Concern Present  . Feeling of Stress : Not at all  Social Connections: Moderately Isolated  . Frequency of Communication with Friends and Family: More than three times a week  . Frequency of Social Gatherings with Friends and Family: Once a week  . Attends Religious Services: Never  . Active Member of Clubs or Organizations: No  . Attends Archivist Meetings: Never  . Marital Status: Married   Family History  Problem Relation Age of Onset  . Leukemia Father   . Hypertension Father   . Diabetes Mother   . Ovarian cancer Sister   . Kidney disease Sister   . Hypertension Son   . Hypertension Daughter    Scheduled Meds: . (feeding supplement) PROSource Plus  30 mL Oral BID BM  .  atorvastatin  80 mg Oral QHS  . diltiazem  240 mg Oral Daily  . feeding supplement (ENSURE ENLIVE)  237 mL Oral BID BM  . gabapentin  300 mg Oral BID  . heparin  5,000 Units Subcutaneous Q8H  . insulin aspart  0-20 Units Subcutaneous TID WC  . insulin aspart  0-5 Units Subcutaneous QHS  . insulin detemir  24 Units Subcutaneous BID  . mometasone-formoterol  2 puff Inhalation BID  And  . umeclidinium bromide  1 puff Inhalation Daily  . multivitamin with minerals  1 tablet Oral Daily  . pantoprazole  40 mg Oral Daily  . sertraline  200 mg Oral Daily   Continuous Infusions: PRN Meds:.acetaminophen **OR** acetaminophen, albuterol, ALPRAZolam, dextrose, HYDROcodone-acetaminophen, ipratropium-albuterol, ondansetron **OR** ondansetron (ZOFRAN) IV Allergies  Allergen Reactions  . Penicillins Swelling and Rash    Did it involve swelling of the face/tongue/throat, SOB, or low BP? Yes Did it involve sudden or severe rash/hives, skin peeling, or any reaction on the inside of your mouth or nose? No Did you need to seek medical attention at a hospital or doctor's office? Yes When did it last happen?Childhood allergy If all above answers are "NO", may proceed with cephalosporin use.     Review of Systems  Constitutional: Positive for activity change and fatigue.  Respiratory: Negative for shortness of breath.   Musculoskeletal: Positive for back pain.  Neurological: Positive for weakness.    Physical Exam Vitals and nursing note reviewed.  Constitutional:      Appearance: He is ill-appearing.  Cardiovascular:     Rate and Rhythm: Normal rate.  Pulmonary:     Effort: No tachypnea, accessory muscle usage or respiratory distress.  Abdominal:     General: Abdomen is flat.  Neurological:     Mental Status: He is alert and oriented to person, place, and time.     Vital Signs: BP (!) 147/55 (BP Location: Right Arm)   Pulse 79   Temp 98 F (36.7 C) (Oral)   Resp 20   Ht _0   (1.93 m)   Wt (!) 162.7 kg   SpO2 92%   BMI 43.66 kg/m  Pain Scale: 0-10 POSS *See Group Information*: S-Acceptable,Sleep, easy to arouse Pain Score: 0-No pain   SpO2: SpO2: 92 % O2 Device:SpO2: 92 % O2 Flow Rate: .O2 Flow Rate (L/min): 4 L/min  IO: Intake/output summary:   Intake/Output Summary (Last 24 hours) at 06/27/2020 0820 Last data filed at 06/27/2020 0554 Gross per 24 hour  Intake 240 ml  Output 700 ml  Net -460 ml    LBM: Last BM Date: 06/23/20 Baseline Weight: Weight: (!) 162.7 kg Most recent weight: Weight: (!) 162.7 kg     Palliative Assessment/Data:     Time In: 1000 Time Out: 1130 Time Total: 90 min Greater than 50%  of this time was spent counseling and coordinating care related to the above assessment and plan.  Signed by: Vinie Sill, NP Palliative Medicine Team Pager # [phone] (M-F 8a-5p) Team Phone # [phone] (Nights/Weekends)

## 2020-06-28 DIAGNOSIS — Z66 Do not resuscitate: Secondary | ICD-10-CM

## 2020-06-28 LAB — GLUCOSE, CAPILLARY
Glucose-Capillary: 302 mg/dL — ABNORMAL HIGH (ref 70–99)
Glucose-Capillary: 349 mg/dL — ABNORMAL HIGH (ref 70–99)
Glucose-Capillary: 427 mg/dL — ABNORMAL HIGH (ref 70–99)

## 2020-06-28 LAB — COMPREHENSIVE METABOLIC PANEL
ALT: 21 U/L (ref 0–44)
AST: 21 U/L (ref 15–41)
Albumin: 2.7 g/dL — ABNORMAL LOW (ref 3.5–5.0)
Alkaline Phosphatase: 173 U/L — ABNORMAL HIGH (ref 38–126)
Anion gap: 11 (ref 5–15)
BUN: 28 mg/dL — ABNORMAL HIGH (ref 8–23)
CO2: 28 mmol/L (ref 22–32)
Calcium: 9.7 mg/dL (ref 8.9–10.3)
Chloride: 95 mmol/L — ABNORMAL LOW (ref 98–111)
Creatinine, Ser: 1.43 mg/dL — ABNORMAL HIGH (ref 0.61–1.24)
GFR calc Af Amer: 58 mL/min — ABNORMAL LOW (ref >60)
GFR calc non Af Amer: 50 mL/min — ABNORMAL LOW (ref >60)
Glucose, Bld: 317 mg/dL — ABNORMAL HIGH (ref 70–99)
Potassium: 3.9 mmol/L (ref 3.5–5.1)
Sodium: 134 mmol/L — ABNORMAL LOW (ref 135–145)
Total Bilirubin: 1.1 mg/dL (ref 0.3–1.2)
Total Protein: 6.3 g/dL — ABNORMAL LOW (ref 6.5–8.1)

## 2020-06-28 LAB — CBC
HCT: 32.2 % — ABNORMAL LOW (ref 39.0–52.0)
Hemoglobin: 10.2 g/dL — ABNORMAL LOW (ref 13.0–17.0)
MCH: 28.3 pg (ref 26.0–34.0)
MCHC: 31.7 g/dL (ref 30.0–36.0)
MCV: 89.4 fL (ref 80.0–100.0)
Platelets: 297 10*3/uL (ref 150–400)
RBC: 3.6 MIL/uL — ABNORMAL LOW (ref 4.22–5.81)
RDW: 14.9 % (ref 11.5–15.5)
WBC: 10.9 10*3/uL — ABNORMAL HIGH (ref 4.0–10.5)
nRBC: 0.2 % (ref 0.0–0.2)

## 2020-06-28 MED ORDER — DILTIAZEM HCL ER COATED BEADS 240 MG PO CP24: 240.0000 mg | ORAL_CAPSULE | Freq: Every day | ORAL | 1 refills | Status: AC

## 2020-06-28 MED ORDER — HYDROCODONE-ACETAMINOPHEN 10-325 MG PO TABS
1.0000 | ORAL_TABLET | ORAL | Status: DC | PRN
  Administered 2020-06-28 (×2): 1 via ORAL
  Filled 2020-06-28: qty 1

## 2020-06-28 MED ORDER — ONDANSETRON HCL 4 MG PO TABS: 4.0000 mg | ORAL_TABLET | Freq: Four times a day (QID) | ORAL | 0 refills | Status: AC | PRN

## 2020-06-28 MED ORDER — PANTOPRAZOLE SODIUM 40 MG PO TBEC: 40.0000 mg | DELAYED_RELEASE_TABLET | Freq: Every day | ORAL | 0 refills | Status: AC

## 2020-06-28 NOTE — Evaluation (Signed)
Occupational Therapy Evaluation Patient Details Name: Kenneth Patrick MRN: [id number] DOB: January 25, 1951 Today's Date: 06/28/2020    History of Present Illness 69 yo male admitted with weakness, dypsnea, new incontinence. Imagin (+) widespread mets, rib fractures, urinary incontinence. Biopsy 9/21. Hx of lung ca, DM, neuropathy, obesity.   Clinical Impression   [name] is a 69 year old man with above medical history who presents with generalized weakness, decreased activity tolerance and complaints of pain resulting in decreased ability to perform functional mobility and ADLs. Patient requiring set up for UB dressing, bathing and grooming and max assist for LB dressing, bathing and toileting. Patient required min guard to stand from elevated bed height and walk several feet with RW on 4 iters. Patient limited by shortness of breath, fatigue and complaints of pain. Patient's spouse plans to take patient home with hospice. Discussed DME that patient will need for return home and multiple options for performing ADL tasks. Patient's spouse encouraged by patient's ability to perform mobility today. No further OT needs at this time.    Follow Up Recommendations  No OT follow up    Equipment Recommendations   (Needs tub bench, bari BSC, hospital bed)    Recommendations for Other Services       Precautions / Restrictions Precautions Precautions: Fall Precaution Comments: T9 Comp Fx, Rib Fx's with METS throughout Restrictions Weight Bearing Restrictions: No      Mobility Bed Mobility Overal bed mobility: Needs Assistance Bed Mobility: Supine to Sit     Supine to sit: Supervision;Min guard     General bed mobility comments: Patient seated on side of bed when therapist entered room.  Transfers Overall transfer level: Needs assistance Equipment used: Rolling walker (2 wheeled) Transfers: Sit to/from Stand Sit to Stand: Supervision;Min guard;From elevated surface          General transfer comment: pt self able sto stand from elevated bed supporting self using Bariatric walker    Balance Overall balance assessment: Mild deficits observed, not formally tested                                         ADL either performed or assessed with clinical judgement   ADL Overall ADL's : Needs assistance/impaired Eating/Feeding: Set up   Grooming: Set up;Sitting   Upper Body Bathing: Set up;Sitting;Moderate assistance   Lower Body Bathing: Maximal assistance;Sit to/from stand;Sitting/lateral leans   Upper Body Dressing : Set up;Sitting   Lower Body Dressing: Maximal assistance;Sit to/from stand   Toilet Transfer: Minimal assistance;Stand-pivot;BSC;RW   Toileting- Clothing Manipulation and Hygiene: Maximal assistance;Sit to/from stand               Vision   Vision Assessment?: No apparent visual deficits     Perception     Praxis      Pertinent Vitals/Pain Pain Assessment: 0-10 Faces Pain Scale: Hurts a little bit Pain Location: back, shoulder blades Pain Descriptors / Indicators: Aching;Grimacing Pain Intervention(s): Monitored during session;Premedicated before session     Hand Dominance Right   Extremity/Trunk Assessment Upper Extremity Assessment Upper Extremity Assessment: Overall WFL for tasks assessed;RUE deficits/detail;LUE deficits/detail RUE Deficits / Details: WNL ROM and strength LUE Deficits / Details: WNL ROM and strength           Communication Communication Communication: No difficulties   Cognition Arousal/Alertness: Awake/alert Behavior During Therapy: WFL for tasks assessed/performed Overall Cognitive Status:  Within Functional Limits for tasks assessed                                 General Comments: AxO X 3 pleasant/willing   General Comments       Exercises     Shoulder Instructions      Home Living Family/patient expects to be discharged to:: Private  residence Living Arrangements: Spouse/significant other   Type of Home: House Home Access: Stairs to enter [employer] of Steps: 1 step up into the kitchen from carport (pt modified so small steps to get up 1 larger step)   Home Layout: One level               Home Equipment: None          Prior Functioning/Environment Level of Independence: Needs assistance  Gait / Transfers Assistance Needed: ambulatory up until a few days before admission to [facility]. just before admission, able to take a few steps just to get to toilet (few steps from office chair pushed to bathroom) ADL's / Homemaking Assistance Needed: wife assisting with bathing, dressing   Comments: per wife, she would like pt to return home. Wife and son (temporarily) will help.        OT Problem List: Decreased activity tolerance;Impaired balance (sitting and/or standing);Decreased knowledge of use of DME or AE;Pain;Cardiopulmonary status limiting activity;Decreased strength      OT Treatment/Interventions:      OT Goals(Current goals can be found in the care plan section) Acute Rehab OT Goals Patient Stated Goal: home. less pain. OT Goal Formulation: With patient Time For Goal Achievement: 07/12/20 Potential to Achieve Goals: Fair  OT Frequency:     Barriers to D/C:            Co-evaluation              AM-PAC OT "6 Clicks" Daily Activity     Outcome Measure Help from another person eating meals?: A Little Help from another person taking care of personal grooming?: A Little Help from another person toileting, which includes using toliet, bedpan, or urinal?: A Lot Help from another person bathing (including washing, rinsing, drying)?: A Lot Help from another person to put on and taking off regular upper body clothing?: A Little Help from another person to put on and taking off regular lower body clothing?: A Lot 6 Click Score: 15   End of Session Equipment Utilized During Treatment: Gait  belt;Oxygen;Rolling walker Nurse Communication: Mobility status  Activity Tolerance: Patient limited by fatigue Patient left: in chair;with call bell/phone within reach;with family/visitor present  OT Visit Diagnosis: Unsteadiness on feet (R26.81);History of falling (Z91.81);Pain Pain - part of body:  (back)                Time: 1030-1053 OT Time Calculation (min): 23 min Charges:  OT General Charges $OT Visit: 1 Visit OT Evaluation $OT Eval Low Complexity: 1 Low  Aristeo Hankerson, OTR/L [place]  Office [phone] Pager: [place] 06/28/2020, 2:28 PM

## 2020-06-28 NOTE — Progress Notes (Signed)
Discharge instructions given to spouse Joycelyn Schmid. Scripts sent to pharmacy of choice. No immediate questions or concerns at this time. Awaiting PTAR for pickup.

## 2020-06-28 NOTE — Progress Notes (Signed)
Inpatient Diabetes Program Recommendations  AACE/ADA: New Consensus Statement on Inpatient Glycemic Control (2015)  Target Ranges:  Prepandial:   less than 140 mg/dL      Peak postprandial:   less than 180 mg/dL (1-2 hours)      Critically ill patients:  140 - 180 mg/dL   Lab Results  Component Value Date   GLUCAP 302 (H) 06/28/2020   HGBA1C 12.1 (H) 06/24/2020    Review of Glycemic Control  Current orders for Inpatient glycemic control: Levemir 24 units bid, Novolog 0-20 units tidwc and hs  HgbA1C - 12.1%.  Had 33 units of correction Novolog yesterday.  Inpatient Diabetes Program Recommendations:     For home:   Levemir 30 units bid Novolog 12 units tidwc for meal coverage insulin (if eating a regular meal)  Call Dr Dorris Fetch with blood sugars next week and schedule appt with him. He may want you to go back on U-500, but at a lower dose.  Has been counseled to check blood sugars at least 3-4x/day and take logbook to MD.  Follow.  Thank you. Lorenda Peck, RD, LDN, CDE Inpatient Diabetes Coordinator [phone]

## 2020-06-28 NOTE — TOC Initial Note (Signed)
Transition of Care [facility]) - Initial/Assessment Note    Patient Details  Name: Kenneth Patrick MRN: [id number] Date of Birth: 1951-04-01  [facility] CM/SW Contact:    Lynnell Catalan, RN Phone Number: 06/28/2020, 3:01 PM  Clinical Narrative:                 This CM was notified by PMT NP that pt wants to go home with hospice and family have chosen [facility] contacted for referral. Pt will need hospital bed, 02, wheelchair, bedside commode and shower chair. Equipment to be delivered between 4-6 pm per [facility]. This CM has set up PTAR for 6:30pm so hospital bed can be in the home prior to pt dc. Yellow DNR on chart for dc. RN aware of plan.   Activities of Daily Living Home Assistive Devices/Equipment: Eyeglasses ADL Screening (condition at time of admission) Patient's cognitive ability adequate to safely complete daily activities?: No Is the patient deaf or have difficulty hearing?: Yes Does the patient have difficulty seeing, even when wearing glasses/contacts?: No Does the patient have difficulty concentrating, remembering, or making decisions?: Yes Patient able to express need for assistance with ADLs?: No Does the patient have difficulty dressing or bathing?: Yes Independently performs ADLs?: No Communication: Independent Dressing (OT): Needs assistance Is this a change from baseline?: Change from baseline, expected to last >3 days Grooming: Needs assistance Is this a change from baseline?: Change from baseline, expected to last >3 days Feeding: Needs assistance Is this a change from baseline?: Change from baseline, expected to last >3 days Bathing: Needs assistance Is this a change from baseline?: Change from baseline, expected to last >3 days Toileting: Needs assistance Is this a change from baseline?: Change from baseline, expected to last >3days In/Out Bed: Needs assistance Is this a change from baseline?: Change from baseline,  expected to last >3 days Walks in Home: Dependent Is this a change from baseline?: Change from baseline, expected to last >3 days Does the patient have difficulty walking or climbing stairs?: Yes Weakness of Legs: Both Weakness of Arms/Hands: Both  Permission Sought/Granted                  Emotional Assessment              Admission diagnosis:  Shortness of breath [R06.02] Hyperglycemia [R73.9] Hypoxia [R09.02] Respiratory failure with hypoxia ([place]) [J96.91] Hyperosmolar hyperglycemic state (HHS) ([place]) [E11.00, E11.65] Patient Active Problem List   Diagnosis Date Noted  . Lymphadenopathy   . 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 Palliative care by specialist   . Hyperglycemia   . Hypoxia   . Hyperosmolar hyperglycemic state (HHS) ([place]) 06/24/2020  . Respiratory failure with hypoxia ([place]) 06/23/2020  . Healthcare maintenance 05/26/2019  . Mohs defect of scalp 10/28/2018  . Cor pulmonale, chronic ([place]) 01/27/2017  . Mixed hyperlipidemia 07/06/2016  . Essential hypertension 09/26/2015  . Morbid obesity ([place]) 09/26/2015  . Chronic obstructive pulmonary disease ([place]) 08/08/2010  . Uncontrolled diabetes mellitus with complications ([place]) 22/29/7989  . OBESITY, UNSPECIFIED 04/08/2010  . ALLERGIC RHINITIS CAUSE UNSPECIFIED 04/08/2010  . ASTHMA, UNSPECIFIED, UNSPECIFIED STATUS 04/08/2010  . LOW BACK PAIN SYNDROME 04/08/2010  . OSA (obstructive sleep apnea) 04/08/2010  . Shortness of breath 04/08/2010   PCP:  [name], MD Pharmacy:   [facility] [address], [place] - [address] [place] [address] Phone: [phone] Fax: [phone]  [place], [place] - [place] [address] [id number] [address] [place] [address] Phone: [phone] Fax: [phone]     Social Determinants of Health (SDOH) Interventions    Readmission Risk Interventions Readmission Risk Prevention Plan 06/28/2020  Transportation  Screening Complete  PCP or Specialist Appt within 3-5 Days Complete  HRI or [facility] Complete  Social Work Consult for [place] Planning/Counseling Complete  Palliative Care Screening Complete  Medication Review [employer]) Complete  Some recent data might be hidden

## 2020-06-28 NOTE — Progress Notes (Signed)
Physical Therapy Treatment Patient Details Name: Kenneth Patrick MRN: [id number] DOB: [birth year] Today's Date: 06/28/2020    History of Present Illness 69 yo male admitted with weakness, dypsnea, new incontinence. Imagin (+) widespread mets, rib fractures, urinary incontinence. Biopsy 9/21. Hx of lung ca, DM, neuropathy, obesity.    PT Comments    Pt in bed on 4 lts with sats at 94% at rest.  Spouse in room as well.  Spouse stated pt has not been walking much past couple of weeks and was using a rolling computer chair to scoot self about the house.  Pt also slep in his lift chair but was able to get on/off their elevated toilet by pulling up on counter.  Spouse plans to take pt home.  Co Tx with OT.  Assisted OOB to amb. General bed mobility comments: pt self able with HOB elevated and use of rail.  General transfer comment: pt self able from elevated bed supporting self using Baruatric walker   General Gait Details: using a Bariatric walker, pt was only able to amb 4 feet due to increased dyspnea and fatigue as well as L LE weakness/fear of buckling.  Recliner following for safety.    Spouse pleased her husband did "better than I expected".  Trial RA.  Patient Saturations on Room Air at Rest = 84%  Patient Saturations on [employer] while Ambulating 4 feet= 81% with 3/4 dyspnea  Patient Saturations on 4Liters of oxygen while Ambulating = 90%  Please briefly explain why patient needs home oxygen: Pt requires supplemental oxygen to achieve therapeutic levels Pt requires atleast 2 lts at rest and 4 lts with activity to maintain a saturation > 90%   Follow Up Recommendations  Home health PT;Supervision/Assistance - 24 hour     Equipment Recommendations  Hospital bed;3in1 (PT);Rolling walker with 5" wheels;Wheelchair (measurements PT) (Bariatric) Long Tub Bench OXYGEN  Recommendations for Other Services       Precautions / Restrictions Precautions Precautions: Fall Precaution  Comments: T9 Comp Fx, Rib Fx's with METS throughout    Mobility  Bed Mobility Overal bed mobility: Needs Assistance Bed Mobility: Supine to Sit     Supine to sit: Supervision;Min guard     General bed mobility comments: pt self able with HOB elevated and use of rail  Transfers Overall transfer level: Needs assistance Equipment used: Rolling walker (2 wheeled) Transfers: Sit to/from Stand Sit to Stand: Supervision;Min guard         General transfer comment: pt self able from elevated bed supporting self using Baruatric walker  Ambulation/Gait Ambulation/Gait assistance: Min guard Gait Distance (Feet): 4 Feet Assistive device: Rolling walker (2 wheeled) Gait Pattern/deviations: Step-through pattern;Decreased stride length     General Gait Details: using a Bariatric walker, pt was only able to amb 4 feet due to increased dyspnea and fatigue as well as L LE weakness/fear of buckling.   Stairs             Wheelchair Mobility    Modified Rankin (Stroke Patients Only)       Balance                                            Cognition Arousal/Alertness: Awake/alert Behavior During Therapy: WFL for tasks assessed/performed Overall Cognitive Status: Within Functional Limits for tasks assessed  General Comments: AxO X 3 pleasant/willing      Exercises      General Comments        Pertinent Vitals/Pain Pain Assessment: Faces Faces Pain Scale: Hurts a little bit Pain Location: back Pain Descriptors / Indicators: Aching;Grimacing Pain Intervention(s): Monitored during session    Home Living                      Prior Function            PT Goals (current goals can now be found in the care plan section) Progress towards PT goals: Progressing toward goals    Frequency    Min 3X/week      PT Plan Current plan remains appropriate    Co-evaluation               AM-PAC PT "6 Clicks" Mobility   Outcome Measure  Help needed turning from your back to your side while in a flat bed without using bedrails?: A Little Help needed moving from lying on your back to sitting on the side of a flat bed without using bedrails?: A Little Help needed moving to and from a bed to a chair (including a wheelchair)?: A Little Help needed standing up from a chair using your arms (e.g., wheelchair or bedside chair)?: A Little Help needed to walk in hospital room?: A Little Help needed climbing 3-5 steps with a railing? : A Lot 6 Click Score: 17    End of Session Equipment Utilized During Treatment: Gait belt Activity Tolerance: Patient tolerated treatment well Patient left: in chair;with call bell/phone within reach;with family/visitor present Nurse Communication: Mobility status PT Visit Diagnosis: Pain;Muscle weakness (generalized) (M62.81);Other symptoms and signs involving the nervous system (R29.898)     Time: 2956-2130 PT Time Calculation (min) (ACUTE ONLY): 29 min  Charges:  $Gait Training: 8-22 mins $Therapeutic Activity: 8-22 mins                     {Montoya Brandel  PTA Acute  Rehabilitation Services Pager      [phone] Office      [phone]

## 2020-06-28 NOTE — Progress Notes (Signed)
PHYSICAL THERAPY  SATURATION QUALIFICATIONS: (This note is used to comply with regulatory documentation for home oxygen)  Patient Saturations on Room Air at Rest = 84%  Patient Saturations on Room Air while Ambulating 4 feet= 81% with 3/4 dyspnea  Patient Saturations on 4Liters of oxygen while Ambulating = 90%  Please briefly explain why patient needs home oxygen: Pt requires supplemental oxygen to achieve therapeutic levels Pt requires atleast 2 lts at rest and 4 lts with activity to maintain a saturation > 90%  Rica Koyanagi  PTA Acute  Rehabilitation Services Pager      [phone] Office      [phone]

## 2020-06-28 NOTE — Discharge Summary (Signed)
Physician Discharge Summary  JAROD BOZZO GMW:[id number] DOB: 01/28/1951 DOA: 06/23/2020  PCP: [name], MD  Admit date: 06/23/2020 Discharge date: 06/28/2020  Admitted From:home Disposition: home with hospice Recommendations for Outpatient Follow-up:  1. Follow up with PCP in 1-2 weeks  Home Health:none Equipment/Devices:hospital bed wheelchair   Discharge Condition:hospice discharge CODE STATUS:dnr Diet recommendation: regular Brief/Interim Summary:[name] a69 y.o.male,with a history of sleep apnea, HLD, DMII, COPD, and cancer who presents to the ED with a chief complaint of Hyperglycemia. Patient is supposed to be on 60units of insulin with supper. He reports that he hasn't taken it in some time because he has not been eating. His wife reports that yesterday he started experiencing polydipsia. He drank some water, but mostly regular gatorade, and dr. Malachi Bonds. Patient reports that he hasn't been checking his glucose. His wife checked it today and the meter read high, so they came to the ER. Patient reports not taking his insulin because he has not been eating. Last normal meal was last week, and wife believes he only had one meal total last week. Patient reports that he suspected his diet was not good for DMII, so he just stopped eating.In addition to this, he has been having increasing dyspnea over the last 2 weeks. He has had associated weakness. He hasn't been able to walk for past two days. Wife has been pushing him in an office chair to the bathroom. He has been incontinent since yesterday, with polyuria that is likely due to the hyperglycemia.He has had a productive cough as well, with yellow sputum. No hemoptysis. He has had no fevers, no sick contacts. Patient is not vaccinated for covid, he reports that he "doesn't trust it."   Of note, patient has had increasing back pain over a couple of months. Patient reports that he had imaging done that showed likely neoplasm,  and he has been following with Dr. Raliegh Ip. He is supposed to have a biopsy on 06/25/20. For this reason, he will be admitted to [facility] long.  ED course T 98.8, P 95, R 33, BP 148/57 PH 7.399, CO2 25, Gap 15, Glucose 895 WBC 10.7 BUN/Cr 35/1.66 CTA = No PE. Primary lung cancer with mets.  Albuterol, aspirin, LR 59ml, famotidine 20mg , insulin drip Discharge Diagnoses:  Active Problems:   Shortness of breath   Respiratory failure with hypoxia (HCC)   Hyperosmolar hyperglycemic state (HHS) ([place])   Hyperglycemia   Hypoxia   Lymphadenopathy   Lung cancer metastatic to bone [facility])   Palliative care by specialist     #1 lung mass with widespread meta stasis lymph node biopsy with poorly differentiated carcinoma-patient follows up with oncology at [place] [facility].  Status post biopsy of the left supraclavicular lymph node with poorly differentiated carcinoma.  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  Hypermetabolic LEFT supraclavicular lymph node is large and would be amenable to biopsy. Multifocal hypermetabolic skeletal metastasis involving the ribs, sternum, spine and pelvis. Pathologic fracture involving several of the RIGHT ribs. Ill-defined hypermetabolic lesion in the dome liver consistent with hepatic metastasis.  Nodal metastasis to the prevascular space, LEFTsupraclavicular node, and abdominal periportal/retroperitoneal nodes.  MRI of the brain 06/17/2020 no evidence of intracranial meta stasis, minimal chronic microvascular ischemic changes.  CT angiogram of the chest 06/23/2020-No evidence of pulmonary embolism.  Heterogeneous left apical soft tissue mass with associated areas  of cortical destruction involving the first and second left ribs, as well as the adjacent portions of the C7 and T1 vertebral bodies. This is  consistent with primary lung malignancy. 2.6 cm x 1.4 cm soft tissue mass along the anterolateral aspect of the right chest wall, which originates from the fifth right rib. This is concerning for metastatic disease. Multiple ill-defined lytic lesions throughout multiple thoracic spine vertebral bodies and multiple ribs, consistent with osseous metastasis.  4.9 cm x 2.9 cm area of heterogeneous low attenuation along the medial aspect of the liver dome. This is suspicious for a metastatic lesion.  Mild atelectasis and/or early infiltrate within the left lung base. Evidence of prior cholecystectomy. MRI of the thoracic and lumbar spine 06/25/2020-Widespread osseous metastases throughout the thoracic and lumbar spine as well as the visualized pelvis, essentially involving all levels. No significant extra osseous or epidural tumor at this time.   Pathologic fracture involving the inferior endplate of T9 with associated mild 20% height loss without bony retropulsion. Partial visualization of patient's known primary mass at the left lung apex, better evaluated on recent chest CT. Early/impending extension of tumor into the left C7-T1 and T1-2 neural foramina without significant stenosis at this time.  Bilateral layering pleural effusions, right greater than left, partially visualized.  Underlying mild multilevel degenerative spondylosis as above. No significant spinal stenosis or neural impingement. Patient was seen by palliative care appreciate their input.  Patient was made DO NOT RESUSCITATE and comfort care and discharged home with hospice.  Life expectancy less than 6 months.  Nutrition Problem: Increased nutrient needs Etiology: cancer and cancer related treatments    Signs/Symptoms: estimated needs     Interventions: Ensure Enlive (each supplement provides 350kcal and 20 grams of protein), MVI, Prostat  Estimated body mass index is 43.66 kg/m as calculated from the following:    Height as of this encounter: 6\' 4"  (1.93 m).   Weight as of this encounter: 162.7 kg.  Discharge Instructions  Discharge Instructions    Diet - low sodium heart healthy   Complete by: As directed    Increase activity slowly   Complete by: As directed      Allergies as of 06/28/2020      Reactions   Penicillins Swelling, Rash   Did it involve swelling of the face/tongue/throat, SOB, or low BP? Yes Did it involve sudden or severe rash/hives, skin peeling, or any reaction on the inside of your mouth or nose? No Did you need to seek medical attention at a hospital or doctor's office? Yes When did it last happen?Childhood allergy If all above answers are "NO", may proceed with cephalosporin use.      Medication List    STOP taking these medications   atorvastatin 80 MG tablet Commonly known as: LIPITOR   diltiazem 240 MG 24 hr capsule Commonly known as: TIAZAC Replaced by: diltiazem 240 MG 24 hr capsule   furosemide 40 MG tablet Commonly known as: LASIX     TAKE these medications   ALPRAZolam 1 MG tablet Commonly known as: Xanax Take 1 tablet (1 mg total) by mouth at bedtime as needed for anxiety.   diltiazem 240 MG 24 hr capsule Commonly known as: CARDIZEM CD Take 1 capsule (240 mg total) by mouth daily. Start taking on: June 29, 2020 Replaces: diltiazem 240 MG 24 hr capsule   FreeStyle [employer] Use one sensor every 10 days.   gabapentin 300 MG capsule Commonly known as: NEURONTIN Take  300 mg by mouth 2 (two) times daily.   glipiZIDE 5 MG tablet Commonly known as: GLUCOTROL TAKE 1 TABLET BY MOUTH TWICE DAILY BEFORE MEAL(S) What changed: See the new instructions.   HumuLIN R U-500 KwikPen 500 UNIT/ML kwikpen Generic drug: insulin regular human CONCENTRATED Inject 80 units with breakfast and Lunch, and 60 units with supper if BG is above 90   HYDROcodone-acetaminophen 10-325 MG tablet Commonly known as: NORCO Take 1 tablet by mouth  3 (three) times daily as needed.   HYDROmorphone 2 MG tablet Commonly known as: Dilaudid Take 1 tablet (2 mg total) by mouth every 3 (three) hours as needed for severe pain.   ondansetron 4 MG tablet Commonly known as: ZOFRAN Take 1 tablet (4 mg total) by mouth every 6 (six) hours as needed for nausea.   oxymetazoline 0.05 % nasal spray Commonly known as: AFRIN Place 1-2 sprays into both nostrils 2 (two) times daily as needed for congestion.   Ozempic (0.25 or 0.5 MG/DOSE) 2 MG/1.5ML Sopn Generic drug: Semaglutide(0.25 or 0.5MG /DOS) INJECT 0.5 MG  INTO THE SKIN ONCE A WEEK   pantoprazole 40 MG tablet Commonly known as: PROTONIX Take 1 tablet (40 mg total) by mouth daily. Start taking on: June 29, 2020   sertraline 100 MG tablet Commonly known as: ZOLOFT Take 200 mg by mouth daily.   spironolactone 25 MG tablet Commonly known as: ALDACTONE Take 1 tablet by mouth once daily   tiZANidine 4 MG tablet Commonly known as: ZANAFLEX Take 4 mg by mouth 2 (two) times daily.   tobramycin 0.3 % ophthalmic solution Commonly known as: TOBREX Place 1 drop into the right eye See admin instructions. Instill 1 drop into the right eye 3 times daily, the day before, the day of and the day after eye injection done every 6 weeks.   Trelegy Ellipta 100-62.5-25 MCG/INH Aepb Generic drug: Fluticasone-Umeclidin-Vilant Inhale 1 puff into the lungs daily.   Ventolin HFA 108 (90 Base) MCG/ACT inhaler Generic drug: albuterol Inhale 2 puffs into the lungs every 6 (six) hours as needed for wheezing or shortness of breath.       Follow-up Information    Vyas, Costella Hatcher, MD Follow up.   Specialty: Internal Medicine Contact information: [place] [address] [phone]        Derek Jack, MD Follow up.   Specialty: Hematology Contact information: [place] [address] [phone]              Allergies  Allergen Reactions  . Penicillins  Swelling and Rash    Did it involve swelling of the face/tongue/throat, SOB, or low BP? Yes Did it involve sudden or severe rash/hives, skin peeling, or any reaction on the inside of your mouth or nose? No Did you need to seek medical attention at a hospital or doctor's office? Yes When did it last happen?Childhood allergy If all above answers are "NO", may proceed with cephalosporin use.      Consultations:  Palliative care   Procedures/Studies: CT Angio Chest PE W and/or Wo Contrast  Result Date: 06/23/2020 CLINICAL DATA:  Shortness of breath and cough x2 weeks. EXAM: CT ANGIOGRAPHY CHEST WITH CONTRAST TECHNIQUE: Multidetector CT imaging of the chest was performed using the standard protocol during bolus administration of intravenous contrast. Multiplanar CT image reconstructions and MIPs were obtained to evaluate the vascular anatomy. CONTRAST:  69mL OMNIPAQUE IOHEXOL 350 MG/ML SOLN COMPARISON:  April 16, 2017 FINDINGS: Cardiovascular: Satisfactory opacification of  the pulmonary arteries to the segmental level. No evidence of pulmonary embolism. Mild artifact is seen overlying the pulmonary arteries within the perihilar region on the left. Normal heart size. No pericardial effusion. Mediastinum/Nodes: No enlarged mediastinal, hilar, or axillary lymph nodes. Thyroid gland, trachea, and esophagus demonstrate no significant findings. Lungs/Pleura: Mild atelectasis and/or early infiltrate is seen within the left lung base. A 4.9 cm x 3.2 cm heterogeneous low-attenuation mass is seen within the left apex. Associated areas of cortical destruction are seen involving the first and second left ribs, as well as the adjacent portions of the C7 and T1 vertebral bodies. A 2.6 cm x 1.4 cm soft tissue mass is seen along the anterolateral aspect of the right chest wall. This originates from the fifth right rib. There is no evidence of a pleural effusion or pneumothorax. Upper Abdomen: A 4.9 cm x 2.9 cm  area of heterogeneous low attenuation is seen along the medial aspect of the liver dome. Surgical clips are noted within the gallbladder fossa. 1.5 cm 1.9 cm lymph nodes are seen along the posteromedial aspect of the inferior vena cava. Musculoskeletal: A 4.3 cm x 2.4 cm soft tissue mass is seen adjacent to the posterior aspect of the proximal left clavicle (axial CT images 1 through 17, CT series number 5). A 1.3 cm x 1.0 cm mildly expansile lytic lesion is seen within the posterior aspect of the fifth left rib. Similar appearing lesions are seen within the posteromedial aspect of the tenth left rib, anterolateral aspect of the seventh left rib, posterior seventh right rib, posterolateral aspect of the eighth right rib and lateral fourth right rib. A nondisplaced 12 left rib fracture is present. A chronic posterior eighth right rib fracture is seen. Multilevel degenerative changes seen throughout the thoracic spine. Multiple ill-defined lytic lesions are noted throughout multiple thoracic spine vertebral bodies. Review of the MIP images confirms the above findings. IMPRESSION: 1. No evidence of pulmonary embolism. 2. Heterogeneous left apical soft tissue mass with associated areas of cortical destruction involving the first and second left ribs, as well as the adjacent portions of the C7 and T1 vertebral bodies. This is consistent with primary lung malignancy. 3. 2.6 cm x 1.4 cm soft tissue mass along the anterolateral aspect of the right chest wall, which originates from the fifth right rib. This is concerning for metastatic disease. 4. Multiple ill-defined lytic lesions throughout multiple thoracic spine vertebral bodies and multiple ribs, consistent with osseous metastasis. 5. 4.9 cm x 2.9 cm area of heterogeneous low attenuation along the medial aspect of the liver dome. This is suspicious for a metastatic lesion. 6. Mild atelectasis and/or early infiltrate within the left lung base. 7. Evidence of prior  cholecystectomy. Electronically Signed   By: Virgina Norfolk M.D.   On: 06/23/2020 19:35   MR Brain W Wo Contrast  Result Date: 06/17/2020 CLINICAL DATA:  Lung cancer EXAM: MRI HEAD WITHOUT AND WITH CONTRAST TECHNIQUE: Multiplanar, multiecho pulse sequences of the brain and surrounding structures were obtained without and with intravenous contrast. CONTRAST:  83mL GADAVIST GADOBUTROL 1 MMOL/ML IV SOLN COMPARISON:  None. FINDINGS: Brain: Scattered punctate FLAIR hyperintense foci involving the periventricular and subcortical white matter are nonspecific however commonly associated with chronic microvascular ischemic changes. No diffusion-weighted signal abnormality. No intracranial hemorrhage. No midline shift, ventriculomegaly or extra-axial fluid collection. No mass lesion. No abnormal enhancement. Vascular: Normal flow voids. Skull and upper cervical spine: Normal marrow signal. Sinuses/Orbits: Sequela of right lens replacement. Sequela of left  enucleation. Mild ethmoid and left maxillary sinus mucosal thickening. No mastoid effusion. Other: Postprocedural appearance of the left frontal scalp. IMPRESSION: No evidence of intracranial metastases. Minimal chronic microvascular ischemic changes. Electronically Signed   By: Primitivo Gauze M.D.   On: 06/17/2020 08:53   MR THORACIC SPINE W WO CONTRAST  Result Date: 06/26/2020 CLINICAL DATA:  Initial evaluation for acute back pain, metastatic disease. EXAM: MRI THORACIC AND LUMBAR SPINE WITHOUT AND WITH CONTRAST TECHNIQUE: Multiplanar and multiecho pulse sequences of the thoracic and lumbar spine were obtained without and with intravenous contrast. CONTRAST:  79mL GADAVIST GADOBUTROL 1 MMOL/ML IV SOLN COMPARISON:  Prior PET-CT from 06/13/2020 and chest CT from 06/23/2020 FINDINGS: MRI THORACIC SPINE FINDINGS Alignment: Vertebral bodies normally aligned with preservation of the normal thoracic kyphosis. No listhesis. Vertebrae: Innumerable metastatic lesions  seen throughout the thoracic spine, involving essentially all levels. Most prominent involvement seen at the level of T3, where tumor involves the posterior aspect of the T3 vertebral body with extension into the posterior elements bilaterally. The posterior elements of T3 are somewhat expanded without frank extra osseous tumor. No other significant extra osseous or epidural tumor identified. Multifocal rib involvement noted as well, also seen on prior CTs. Pathologic fracture involving the inferior endplate of T9 with associated 20% height loss without bony retropulsion. Otherwise, vertebral body height maintained with no other pathologic fracture identified. Cord: Signal intensity within the thoracic spinal cord is within normal limits. No cord signal abnormality or abnormal enhancement. No significant epidural tumor at this time. Paraspinal and other soft tissues: Patient's known primary mass positioned at the posterior left lung apex again seen, better evaluated on recent chest CT. Early/impending extension into the left C7-T1 and T1-2 neural foramina. Associated edema within the adjacent paraspinous soft tissues. Paraspinous soft tissues demonstrate no other acute finding. Right greater than left layering bilateral pleural effusions partially visualized. Disc levels: T1-2: Negative interspace. Probable early/impending extension of tumor into the left neural foramen (series 18, image 14). No significant stenosis at this time. T2-3: Unremarkable. T3-4:  Unremarkable. T4-5: Small right paracentral disc protrusion mildly flattens the right ventral thecal sac. No significant stenosis or cord deformity. Foramina remain patent. T5-6:  Unremarkable. T6-7: Shallow broad-based central to left paracentral disc protrusion flattens the ventral thecal sac. No significant spinal stenosis or cord deformity. Foramina remain patent. T7-8: Minimal disc bulge. Mild prominence of the dorsal epidural fat. No significant stenosis.  T8-9: Broad-based central to left paracentral disc protrusion mildly flattens the ventral thecal sac. No significant stenosis or cord deformity. Foramina remain patent. T9-10: Central disc extrusion with superior migration. Flattening of the ventral thecal sac without significant stenosis or cord deformity. Foramina remain patent. T10-11: Negative interspace. Mild posterior element hypertrophy. No stenosis. T11-12:  Unremarkable. T12-L1:  Unremarkable. MRI LUMBAR SPINE FINDINGS Segmentation:  Standard. Alignment:  Physiologic. Vertebrae: Innumerable metastatic lesion seen throughout the visualized lumbar spine and pelvis, essentially involving all levels. For reference purposes, largest lesion is position within the posterior aspect of the L2 vertebral body and measures up to 2.6 cm. No significant extra osseous or epidural extension of tumor. No associated pathologic fracture or other complication. Conus medullaris: Extends to the L1 level and appears normal. Incidental note made of a small fatty filum terminale. No intracanalicular or epidural tumor. No other abnormal enhancement. Paraspinal and other soft tissues: Paraspinous soft tissues demonstrate no acute finding. Disc levels: L1-2:  Minimal annular disc bulge.  No canal or foraminal stenosis. L2-3: Disc desiccation without significant  disc bulge. No stenosis or impingement. L3-4: Disc desiccation without significant disc bulge. Mild facet hypertrophy. No stenosis or impingement. L4-5: Mild intervertebral disc space narrowing with diffuse disc bulge, eccentric to the left. Mild facet and ligament flavum hypertrophy. Borderline mild left lateral recess stenosis. Central canal remains patent. No significant foraminal narrowing. L5-S1: Disc desiccation. Superimposed shallow left foraminal disc protrusion with annular fissure (series 23, image 32). Mild facet hypertrophy. No significant spinal stenosis. Foramina remain patent. IMPRESSION: 1. Widespread osseous  metastases throughout the thoracic and lumbar spine as well as the visualized pelvis, essentially involving all levels. No significant extra osseous or epidural tumor at this time. 2. Pathologic fracture involving the inferior endplate of T9 with associated mild 20% height loss without bony retropulsion. 3. Partial visualization of patient's known primary mass at the left lung apex, better evaluated on recent chest CT. Early/impending extension of tumor into the left C7-T1 and T1-2 neural foramina without significant stenosis at this time. 4. Bilateral layering pleural effusions, right greater than left, partially visualized. 5. Underlying mild multilevel degenerative spondylosis as above. No significant spinal stenosis or neural impingement. Electronically Signed   By: Jeannine Boga M.D.   On: 06/26/2020 00:54   MR Lumbar Spine W Wo Contrast  Result Date: 06/26/2020 CLINICAL DATA:  Initial evaluation for acute back pain, metastatic disease. EXAM: MRI THORACIC AND LUMBAR SPINE WITHOUT AND WITH CONTRAST TECHNIQUE: Multiplanar and multiecho pulse sequences of the thoracic and lumbar spine were obtained without and with intravenous contrast. CONTRAST:  65mL GADAVIST GADOBUTROL 1 MMOL/ML IV SOLN COMPARISON:  Prior PET-CT from 06/13/2020 and chest CT from 06/23/2020 FINDINGS: MRI THORACIC SPINE FINDINGS Alignment: Vertebral bodies normally aligned with preservation of the normal thoracic kyphosis. No listhesis. Vertebrae: Innumerable metastatic lesions seen throughout the thoracic spine, involving essentially all levels. Most prominent involvement seen at the level of T3, where tumor involves the posterior aspect of the T3 vertebral body with extension into the posterior elements bilaterally. The posterior elements of T3 are somewhat expanded without frank extra osseous tumor. No other significant extra osseous or epidural tumor identified. Multifocal rib involvement noted as well, also seen on prior CTs.  Pathologic fracture involving the inferior endplate of T9 with associated 20% height loss without bony retropulsion. Otherwise, vertebral body height maintained with no other pathologic fracture identified. Cord: Signal intensity within the thoracic spinal cord is within normal limits. No cord signal abnormality or abnormal enhancement. No significant epidural tumor at this time. Paraspinal and other soft tissues: Patient's known primary mass positioned at the posterior left lung apex again seen, better evaluated on recent chest CT. Early/impending extension into the left C7-T1 and T1-2 neural foramina. Associated edema within the adjacent paraspinous soft tissues. Paraspinous soft tissues demonstrate no other acute finding. Right greater than left layering bilateral pleural effusions partially visualized. Disc levels: T1-2: Negative interspace. Probable early/impending extension of tumor into the left neural foramen (series 18, image 14). No significant stenosis at this time. T2-3: Unremarkable. T3-4:  Unremarkable. T4-5: Small right paracentral disc protrusion mildly flattens the right ventral thecal sac. No significant stenosis or cord deformity. Foramina remain patent. T5-6:  Unremarkable. T6-7: Shallow broad-based central to left paracentral disc protrusion flattens the ventral thecal sac. No significant spinal stenosis or cord deformity. Foramina remain patent. T7-8: Minimal disc bulge. Mild prominence of the dorsal epidural fat. No significant stenosis. T8-9: Broad-based central to left paracentral disc protrusion mildly flattens the ventral thecal sac. No significant stenosis or cord deformity. Foramina remain  patent. T9-10: Central disc extrusion with superior migration. Flattening of the ventral thecal sac without significant stenosis or cord deformity. Foramina remain patent. T10-11: Negative interspace. Mild posterior element hypertrophy. No stenosis. T11-12:  Unremarkable. T12-L1:  Unremarkable. MRI  LUMBAR SPINE FINDINGS Segmentation:  Standard. Alignment:  Physiologic. Vertebrae: Innumerable metastatic lesion seen throughout the visualized lumbar spine and pelvis, essentially involving all levels. For reference purposes, largest lesion is position within the posterior aspect of the L2 vertebral body and measures up to 2.6 cm. No significant extra osseous or epidural extension of tumor. No associated pathologic fracture or other complication. Conus medullaris: Extends to the L1 level and appears normal. Incidental note made of a small fatty filum terminale. No intracanalicular or epidural tumor. No other abnormal enhancement. Paraspinal and other soft tissues: Paraspinous soft tissues demonstrate no acute finding. Disc levels: L1-2:  Minimal annular disc bulge.  No canal or foraminal stenosis. L2-3: Disc desiccation without significant disc bulge. No stenosis or impingement. L3-4: Disc desiccation without significant disc bulge. Mild facet hypertrophy. No stenosis or impingement. L4-5: Mild intervertebral disc space narrowing with diffuse disc bulge, eccentric to the left. Mild facet and ligament flavum hypertrophy. Borderline mild left lateral recess stenosis. Central canal remains patent. No significant foraminal narrowing. L5-S1: Disc desiccation. Superimposed shallow left foraminal disc protrusion with annular fissure (series 23, image 32). Mild facet hypertrophy. No significant spinal stenosis. Foramina remain patent. IMPRESSION: 1. Widespread osseous metastases throughout the thoracic and lumbar spine as well as the visualized pelvis, essentially involving all levels. No significant extra osseous or epidural tumor at this time. 2. Pathologic fracture involving the inferior endplate of T9 with associated mild 20% height loss without bony retropulsion. 3. Partial visualization of patient's known primary mass at the left lung apex, better evaluated on recent chest CT. Early/impending extension of tumor into  the left C7-T1 and T1-2 neural foramina without significant stenosis at this time. 4. Bilateral layering pleural effusions, right greater than left, partially visualized. 5. Underlying mild multilevel degenerative spondylosis as above. No significant spinal stenosis or neural impingement. Electronically Signed   By: Jeannine Boga M.D.   On: 06/26/2020 00:54   NM PET Image Initial (PI) Skull Base To Thigh  Result Date: 06/14/2020 CLINICAL DATA:  Initial treatment strategy for lung mass. EXAM: NUCLEAR MEDICINE PET SKULL BASE TO THIGH TECHNIQUE: 15.1 mCi F-18 FDG was injected intravenously. Full-ring PET imaging was performed from the skull base to thigh after the radiotracer. CT data was obtained and used for attenuation correction and anatomic localization. Fasting blood glucose: 313 mg/dl COMPARISON:  None available FINDINGS: Mediastinal blood pool activity: SUV max 3.5 Liver activity: SUV max 6.1 NECK: Enlarged hypermetabolic LEFT supraclavicular lymph node measures 25 mm short axis with SUV max equal 10.7. Incidental CT findings: none CHEST: Hypermetabolic LEFT apical mass measures approximately 38 mm x 36 mm with SUV max equal 10.1. The mass involves the apical chest wall with destruction of the first rib posteriorly (image 40). Several hypermetabolic small prevascular lymph nodes as well as hypermetabolic thickening of the pleural surface along the medial aspect of the LEFT upper mediastinum. Incidental CT findings: none ABDOMEN/PELVIS: Ill-defined hypermetabolic lesion in the dome liver measures approximately 15 mm SUV max equal 9.4. Hypermetabolic periportal lymph node measures 18 mm SUV max equal 7.5. Enlarged lymph node position between the aorta and IVC measuring 11 mm inferior to the kidneys with SUV max equal 4.8 (image 141) Incidental CT findings: Diverticulosis noted. Atherosclerotic calcification of the aorta. SKELETON:  Widespread hypermetabolic lesions within the pelvis, spine and multiple  ribs. Pathologic fractures involving several of the posterior RIGHT ribs (SUV max equal 7.2 on image 77 associated RIGHT rib fracture). Expansile hypermetabolic lesion in the anterior RIGHT rib with SUV max equal 12.1 (image 77). Lesion in the mid sternum with SUV max equal 6.5. Lesion the posterior RIGHT iliac bone with SUV max equal 8.1. Incidental CT findings: none IMPRESSION: 1. 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 2. Hypermetabolic LEFT supraclavicular lymph node is large and would be amenable to biopsy. 3. Multifocal hypermetabolic skeletal metastasis involving the ribs, sternum, spine and pelvis. Pathologic fracture involving several of the RIGHT ribs. 4. Ill-defined hypermetabolic lesion in the dome liver consistent with hepatic metastasis. 5. Nodal metastasis to the prevascular space, LEFT supraclavicular node, and abdominal periportal/retroperitoneal nodes. Electronically Signed   By: Suzy Bouchard M.D.   On: 06/14/2020 07:27   DG Chest Portable 1 View  Result Date: 06/23/2020 CLINICAL DATA:  Hypoxia with shortness of breath and cough 2 weeks. History of lung cancer. EXAM: PORTABLE CHEST 1 VIEW COMPARISON:  04/30/2020 and PET-CT 06/13/2020 FINDINGS: Lungs are adequately inflated demonstrate worsening pleural base mass over the lateral right mid to lower thorax. Right eighth posterior rib fracture which may be pathologic. Lungs are adequately inflated without acute airspace process or effusion. Cardiomediastinal silhouette is normal. Suggestion of lytic process involving the right posterior seventh rib. IMPRESSION: 1. No acute airspace process. 2. Worsening pleural based mass over the lateral right mid to lower thorax. Right eighth posterior rib fracture which may be pathologic. Possible lytic process involving the right posterior seventh rib. These findings are likely due to metastatic  disease. Electronically Signed   By: Marin Olp M.D.   On: 06/23/2020 16:25   ECHOCARDIOGRAM COMPLETE  Result Date: 06/25/2020    ECHOCARDIOGRAM REPORT   Patient Name:   Axcel A Bertelson Date of Exam: 06/25/2020 Medical Rec #:  [id number]      Height:       76.0 in Accession #:    [id number]     Weight:       358.7 lb Date of Birth:  [birth year]      BSA:          2.839 m Patient Age:    69 years       BP:           134/54 mmHg Patient Gender: M              HR:           79 bpm. Exam Location:  Inpatient Procedure: 2D Echo, Cardiac Doppler and Color Doppler Indications:    Abnormal EKG  History:        Patient has prior history of Echocardiogram examinations. COPD,                 Signs/Symptoms:Shortness of Breath and Morbid obesity; Risk                 Factors:Diabetes, Dyslipidemia, Former Smoker, Sleep Apnea and                 Lung cancer. Left apical mass, resp. failure.  Sonographer:    Dustin Flock Referring Phys: [id number] [place] Comments: Patient is morbidly obese. Image acquisition challenging due to uncooperative patient, Image acquisition challenging due to patient body habitus, Image acquisition challenging due  to COPD and Lung cancer. IMPRESSIONS  1. Left ventricular ejection fraction, by estimation, is 55 to 60%. The left ventricle has normal function. The left ventricle has no regional wall motion abnormalities. Left ventricular diastolic parameters were normal.  2. Right ventricular systolic function is normal. The right ventricular size is normal.  3. The mitral valve is grossly normal. No evidence of mitral valve regurgitation. No evidence of mitral stenosis.  4. The aortic valve is grossly normal. Aortic valve regurgitation is not visualized. No aortic stenosis is present.  5. The inferior vena cava is normal in size with greater than 50% respiratory variability, suggesting right atrial pressure of 3 mmHg. FINDINGS  Left Ventricle: Left ventricular ejection  fraction, by estimation, is 55 to 60%. The left ventricle has normal function. The left ventricle has no regional wall motion abnormalities. The left ventricular internal cavity size was normal in size. There is  no left ventricular hypertrophy. Left ventricular diastolic parameters were normal. Right Ventricle: The right ventricular size is normal. No increase in right ventricular wall thickness. Right ventricular systolic function is normal. Left Atrium: Left atrial size was normal in size. Right Atrium: Right atrial size was normal in size. Pericardium: There is no evidence of pericardial effusion. Mitral Valve: The mitral valve is grossly normal. No evidence of mitral valve regurgitation. No evidence of mitral valve stenosis. Tricuspid Valve: The tricuspid valve is grossly normal. Tricuspid valve regurgitation is not demonstrated. No evidence of tricuspid stenosis. Aortic Valve: The aortic valve is grossly normal. Aortic valve regurgitation is not visualized. No aortic stenosis is present. Pulmonic Valve: The pulmonic valve was not well visualized. Pulmonic valve regurgitation is not visualized. No evidence of pulmonic stenosis. Aorta: The aortic root is normal in size and structure. Venous: The inferior vena cava is normal in size with greater than 50% respiratory variability, suggesting right atrial pressure of 3 mmHg. IAS/Shunts: No atrial level shunt detected by color flow Doppler.   Diastology LV e' medial:    5.87 cm/s LV E/e' medial:  11.2 LV e' lateral:   12.10 cm/s LV E/e' lateral: 5.4  RIGHT VENTRICLE RV Basal diam:  3.10 cm RV S prime:     12.30 cm/s TAPSE (M-mode): 3.1 cm LEFT ATRIUM           Index LA Vol (A4C): 49.5 ml 17.43 ml/m  AORTIC VALVE LVOT Vmax:   128.00 cm/s LVOT Vmean:  91.100 cm/s LVOT VTI:    0.291 m MITRAL VALVE MV Area (PHT): 4.15 cm    SHUNTS MV Decel Time: 183 msec    Systemic VTI: 0.29 m MV E velocity: 65.60 cm/s MV A velocity: 78.40 cm/s MV E/A ratio:  0.84 Mihai Croitoru MD  Electronically signed by Sanda Klein MD Signature Date/Time: 06/25/2020/2:48:13 PM    Final    [place] CORE BIOPSY (LYMPH NODES)  Result Date: 06/25/2020 INDICATION: Lung mass.  Hypermetabolic enlarged left supraclavicular adenopathy EXAM: ULTRASOUND GUIDED CORE BIOPSY OF LEFT SUPRACLAVICULAR ADENOPATHY MEDICATIONS: lidocaine 1% subcutaneous ANESTHESIA/SEDATION: None required PROCEDURE: The procedure, risks, benefits, and alternatives were explained to the patient and spouse. Questions regarding the procedure were encouraged and answered. The spouse understands and consents to the procedure. Survey ultrasound of the left supraclavicular region was performed. Hypoechoic adenopathy was localized and an appropriate skin entry site was determined and marked. The operative field was prepped with chlorhexidine in a sterile fashion, and a sterile drape was applied covering the operative field. A sterile gown and sterile gloves were used for  the procedure. Local anesthesia was provided with 1% Lidocaine. Under real-time ultrasound guidance, 4 18-gauge core biopsy samples were obtained, submitted in formalin to surgical pathology. The guide needle was removed. Postprocedure scans show no hemorrhage or other apparent complication. The patient tolerated the procedure well. COMPLICATIONS: None immediate. FINDINGS: Left supraclavicular adenopathy was localized. Representative core biopsy samples obtained as above. IMPRESSION: 1. Technically successful left supraclavicular adenopathy core biopsy under ultrasound guidance. Electronically Signed   By: Lucrezia Europe M.D.   On: 06/25/2020 15:37   DG HIP UNILAT W OR W/O PELVIS 2-3 VIEWS RIGHT  Result Date: 06/05/2020 CLINICAL DATA:  Right posterior hip pain.  No known injury. EXAM: DG HIP (WITH OR WITHOUT PELVIS) 2-3V RIGHT COMPARISON:  None. FINDINGS: Bones of the pelvis appear normal. Sacroiliac joints and symphysis pubis appear normal. No joint space narrowing of either hip joint.  No sign of avascular necrosis or focal bone lesion. IMPRESSION: Negative radiographs. Electronically Signed   By: Nelson Chimes M.D.   On: 06/05/2020 16:01    (Echo, Carotid, EGD, Colonoscopy, ERCP)    Subjective:  Awake eating breakfast Discharge Exam: Vitals:   06/28/20 0602 06/28/20 0900  BP: (!) 128/52   Pulse: 73   Resp: 20   Temp: (!) 97.5 F (36.4 C)   SpO2: 91% 90%   Vitals:   06/27/20 2020 06/27/20 2034 06/28/20 0602 06/28/20 0900  BP:  102/71 (!) 128/52   Pulse:  77 73   Resp:  20 20   Temp:  97.9 F (36.6 C) (!) 97.5 F (36.4 C)   TempSrc:  Oral Oral   SpO2: 91% 92% 91% 90%  Weight:      Height:        General: Pt is alert, awake, not in acute distress Cardiovascular: RRR, S1/S2 +, no rubs, no gallops Respiratory: CTA bilaterally, no wheezing, no rhonchi Abdominal: Soft, NT, ND, bowel sounds + Extremities: no edema, no cyanosis Left supraclavicular fossa fullness    The results of significant diagnostics from this hospitalization (including imaging, microbiology, ancillary and laboratory) are listed below for reference.     Microbiology: Recent Results (from the past 240 hour(s))  SARS Coronavirus 2 by RT PCR (hospital order, performed in [facility] hospital lab) Nasopharyngeal Nasopharyngeal Swab     Status: None   Collection Time: 06/23/20  3:54 PM   Specimen: Nasopharyngeal Swab  Result Value Ref Range Status   SARS Coronavirus 2 NEGATIVE NEGATIVE Final    Comment: (NOTE) SARS-CoV-2 target nucleic acids are NOT DETECTED.  The SARS-CoV-2 RNA is generally detectable in upper and lower respiratory specimens during the acute phase of infection. The lowest concentration of SARS-CoV-2 viral copies this assay can detect is 250 copies / mL. A negative result does not preclude SARS-CoV-2 infection and should not be used as the sole basis for treatment or other patient management decisions.  A negative result may occur with improper specimen collection  / handling, submission of specimen other than nasopharyngeal swab, presence of viral mutation(s) within the areas targeted by this assay, and inadequate number of viral copies (<250 copies / mL). A negative result must be combined with clinical observations, patient history, and epidemiological information.  Fact Sheet for Patients:   StrictlyIdeas.no  Fact Sheet for Healthcare Providers: BankingDealers.co.za  This test is not yet approved or  cleared by the [place] FDA and has been authorized for detection and/or diagnosis of SARS-CoV-2 by FDA under an Emergency Use Authorization (EUA).  This EUA  will remain in effect (meaning this test can be used) for the duration of the COVID-19 declaration under Section 564(b)(1) of the Act, 21 U.S.C. section 360bbb-3(b)(1), unless the authorization is terminated or revoked sooner.  Performed at [facility], [address] [place], [place] [address]      Labs: BNP (last 3 results) Recent Labs    06/23/20 1540  BNP 03.1   Basic Metabolic Panel: Recent Labs  Lab 06/23/20 2322 06/24/20 0439 06/26/20 0601 06/27/20 0548 06/28/20 0622  NA 130* 137 136 134* 134*  K 3.3* 3.2* 3.7 3.3* 3.9  CL 90* 96* 97* 94* 95*  CO2 26 29 28 29 28   GLUCOSE 615* 171* 241* 277* 317*  BUN 33* 30* 30* 27* 28*  CREATININE 1.50* 1.28* 1.14 1.17 1.43*  CALCIUM 10.7* 11.0* 10.0 9.8 9.7  MG  --  2.0  --   --   --    Liver Function Tests: Recent Labs  Lab 06/23/20 1540 06/24/20 0439 06/26/20 0601 06/27/20 0548 06/28/20 0622  AST 12* 18 26 25 21   ALT 19 18 20 21 21   ALKPHOS 225* 201* 158* 184* 173*  BILITOT 1.2 0.8 0.9 1.0 1.1  PROT 7.3 6.9 5.7* 6.3* 6.3*  ALBUMIN 3.2* 2.9* 2.5* 2.6* 2.7*   No results for input(s): LIPASE, AMYLASE in the last 168 hours. No results for input(s): AMMONIA in the last 168 hours. CBC: Recent Labs  Lab 06/23/20 1540 06/24/20 0439 06/26/20 0601 06/27/20 0548  06/28/20 0622  WBC 10.7* 15.8* 10.2 10.1 10.9*  NEUTROABS 9.3* 12.4*  --   --   --   HGB 10.7* 10.7* 9.9* 9.8* 10.2*  HCT 34.1* 32.5* 30.2* 30.8* 32.2*  MCV 90.2 84.9 85.6 87.7 89.4  PLT 348 383 324 306 297   Cardiac Enzymes: No results for input(s): CKTOTAL, CKMB, CKMBINDEX, TROPONINI in the last 168 hours. BNP: Invalid input(s): POCBNP CBG: Recent Labs  Lab 06/27/20 1129 06/27/20 1702 06/27/20 2032 06/28/20 0811 06/28/20 1135  GLUCAP 227* 275* 334* 302* 349*   D-Dimer No results for input(s): DDIMER in the last 72 hours. Hgb A1c No results for input(s): HGBA1C in the last 72 hours. Lipid Profile No results for input(s): CHOL, HDL, LDLCALC, TRIG, CHOLHDL, LDLDIRECT in the last 72 hours. Thyroid function studies No results for input(s): TSH, T4TOTAL, T3FREE, THYROIDAB in the last 72 hours.  Invalid input(s): FREET3 Anemia work up No results for input(s): VITAMINB12, FOLATE, FERRITIN, TIBC, IRON, RETICCTPCT in the last 72 hours. Urinalysis    Component Value Date/Time   COLORURINE YELLOW 11/29/2013 1452   APPEARANCEUR CLEAR 11/29/2013 1452   LABSPEC 1.027 11/29/2013 1452   PHURINE 6.0 11/29/2013 1452   GLUCOSEU >1000 (A) 11/29/2013 1452   HGBUR SMALL (A) 11/29/2013 1452   BILIRUBINUR NEGATIVE 11/29/2013 1452   KETONESUR NEGATIVE 11/29/2013 1452   PROTEINUR NEGATIVE 11/29/2013 1452   UROBILINOGEN 0.2 11/29/2013 1452   NITRITE NEGATIVE 11/29/2013 1452   LEUKOCYTESUR NEGATIVE 11/29/2013 1452   Sepsis Labs Invalid input(s): PROCALCITONIN,  WBC,  LACTICIDVEN Microbiology Recent Results (from the past 240 hour(s))  SARS Coronavirus 2 by RT PCR (hospital order, performed in [place] hospital lab) Nasopharyngeal Nasopharyngeal Swab     Status: None   Collection Time: 06/23/20  3:54 PM   Specimen: Nasopharyngeal Swab  Result Value Ref Range Status   SARS Coronavirus 2 NEGATIVE NEGATIVE Final    Comment: (NOTE) SARS-CoV-2 target nucleic acids are NOT  DETECTED.  The SARS-CoV-2 RNA is generally detectable in  upper and lower respiratory specimens during the acute phase of infection. The lowest concentration of SARS-CoV-2 viral copies this assay can detect is 250 copies / mL. A negative result does not preclude SARS-CoV-2 infection and should not be used as the sole basis for treatment or other patient management decisions.  A negative result may occur with improper specimen collection / handling, submission of specimen other than nasopharyngeal swab, presence of viral mutation(s) within the areas targeted by this assay, and inadequate number of viral copies (<250 copies / mL). A negative result must be combined with clinical observations, patient history, and epidemiological information.  Fact Sheet for Patients:   StrictlyIdeas.no  Fact Sheet for Healthcare Providers: BankingDealers.co.za  This test is not yet approved or  cleared by the [place] FDA and has been authorized for detection and/or diagnosis of SARS-CoV-2 by FDA under an Emergency Use Authorization (EUA).  This EUA will remain in effect (meaning this test can be used) for the duration of the COVID-19 declaration under Section 564(b)(1) of the Act, 21 U.S.C. section 360bbb-3(b)(1), unless the authorization is terminated or revoked sooner.  Performed at [facility], [address], [place], [place] [address]      Time coordinating discharge:  39 minutes  SIGNED:   Georgette Shell, MD  [facility] 06/28/2020, 2:11 PM

## 2020-06-28 NOTE — Progress Notes (Signed)
Palliative:  HPI: 69 y.o. male  with past medical history of sleep apnea, hyperlipidemia, diabetes, COPD, cancer admitted on 06/23/2020 with hyperglycemia and has been holding his insulin as he has not been eating well but was drinking. Undergoing work up for newly suspected cancer with Dr. Delton Coombes with plans for biopsy 06/25/20. PET scan completed 06/13/20 with evidence of hypermetabolic areas in left apical mass extending to chest wall and first rib, left supraclavicular lymph node, skeletal mets to ribs/sternum/spine/pelvis, liver lesion, abdominal periportal/retroperitoneal nodes. Also with underlying weakness.    I met today with Kenneth Patrick and his wife at bedside. He has just completed session with therapy and did a little better today and sitting up in recliner. He continues with pain and can tell little difference from increase in gabapentin. He does have some relief from hydrocodone and will liberalize frequency of this for improved relief. I did educate them that we have many options if this pain persists to even long acting opioid if needed.   We further discussed plans to move forward. They confirm desire for DNR as well as desire for hospice support at home. Family are all onboard for focus on quality of life and comfort. We did discuss poor prognosis of < 6 months and signs of decline with worsening weakness, poor intake, and sleeping all the time as signs of worsening prognosis. Hospice will assist them with recognizing these signs as they follow at home as well. They will need maximum amount of equipment available at home.  All questions/concerns addressed. Emotional support provided. Discussed with Dr. Rodena Piety, RN Maudie Mercury, [facility].   Exam: Alert, oriented. No distress. Weakness. Abd soft. Generalized weakness and edema in legs.   Plan: - Home with hospice support. Hopeful for today.  - Pain: Continue gabapentin 600 mg every 12 hours. Increased Norco 10-325 mg to every 4 hours as needed. Hospice  to continue adjustments as needed.  - DNR signed and placed on chart.   [place], NP Palliative Medicine Team Pager [phone] (Please see [email or web address] for schedule) Team Phone [phone]    Greater than 50%  of this time was spent counseling and coordinating care related to the above assessment and plan

## 2020-06-29 LAB — COMPREHENSIVE METABOLIC PANEL
ALT: 24 U/L (ref 0–44)
AST: 23 U/L (ref 15–41)
Albumin: 2.8 g/dL — ABNORMAL LOW (ref 3.5–5.0)
Alkaline Phosphatase: 180 U/L — ABNORMAL HIGH (ref 38–126)
Anion gap: 11 (ref 5–15)
BUN: 30 mg/dL — ABNORMAL HIGH (ref 8–23)
CO2: 29 mmol/L (ref 22–32)
Calcium: 10.1 mg/dL (ref 8.9–10.3)
Chloride: 93 mmol/L — ABNORMAL LOW (ref 98–111)
Creatinine, Ser: 1.29 mg/dL — ABNORMAL HIGH (ref 0.61–1.24)
GFR calc Af Amer: >60 mL/min (ref >60)
GFR calc non Af Amer: 57 mL/min — ABNORMAL LOW (ref >60)
Glucose, Bld: 400 mg/dL — ABNORMAL HIGH (ref 70–99)
Potassium: 3.7 mmol/L (ref 3.5–5.1)
Sodium: 133 mmol/L — ABNORMAL LOW (ref 135–145)
Total Bilirubin: 1 mg/dL (ref 0.3–1.2)
Total Protein: 6.7 g/dL (ref 6.5–8.1)

## 2020-06-29 LAB — GLUCOSE, CAPILLARY: Glucose-Capillary: 345 mg/dL — ABNORMAL HIGH (ref 70–99)

## 2020-06-29 LAB — CBC
HCT: 31.4 % — ABNORMAL LOW (ref 39.0–52.0)
Hemoglobin: 10 g/dL — ABNORMAL LOW (ref 13.0–17.0)
MCH: 28.2 pg (ref 26.0–34.0)
MCHC: 31.8 g/dL (ref 30.0–36.0)
MCV: 88.7 fL (ref 80.0–100.0)
Platelets: 314 10*3/uL (ref 150–400)
RBC: 3.54 MIL/uL — ABNORMAL LOW (ref 4.22–5.81)
RDW: 15 % (ref 11.5–15.5)
WBC: 10 10*3/uL (ref 4.0–10.5)
nRBC: 0 % (ref 0.0–0.2)

## 2020-07-02 ENCOUNTER — Other Ambulatory Visit: Payer: Self-pay | Admitting: *Deleted

## 2020-07-02 ENCOUNTER — Other Ambulatory Visit: Payer: Self-pay | Admitting: "Endocrinology

## 2020-07-02 NOTE — Patient Outreach (Signed)
[facility]) Care Management  07/02/2020  DAYVIN ABER 09-Jan-1951 [id number]   EMMI-general discharge RED ON EMMI ALERT Day #  1 07/01/20 1000      Red Alert Reason: Scheduled follow-up? No   Insurance: [employer]  [facility] admissions x  1 ED visits x 1in the last 6 months  06/23/20-06/28/20 [facility] discharged home per Epic notes with hospice  Outreach attempt # 1 unsuccessful No answer. [facility] RN CM left HIPAA [facility] Portability and Accountability Act) compliant voicemail message along with CM's contact info.   Plan: [facility] RN CM sent an unsuccessful outreach letter and scheduled this patient for another call attempt within 4 business days   Deondria Puryear L. Lavina Hamman, RN, BSN, [place] Coordinator Office number [phone] Main [facility] number [phone] Fax number [phone]

## 2020-07-03 ENCOUNTER — Inpatient Hospital Stay (HOSPITAL_COMMUNITY): Payer: Medicare Other | Admitting: Hematology

## 2020-07-04 ENCOUNTER — Encounter: Payer: Self-pay | Admitting: *Deleted

## 2020-07-04 ENCOUNTER — Other Ambulatory Visit: Payer: Self-pay

## 2020-07-04 ENCOUNTER — Other Ambulatory Visit: Payer: Self-pay | Admitting: *Deleted

## 2020-07-04 DIAGNOSIS — E1165 Type 2 diabetes mellitus with hyperglycemia: Secondary | ICD-10-CM | POA: Insufficient documentation

## 2020-07-04 NOTE — Patient Outreach (Signed)
[facility]) Care Management  07/04/2020  Kenneth Patrick [birth year] [id number]   [facility] outreach for EMMI-general discharge RED ON EMMI ALERT Day #  1 07/01/20 1000      Red Alert Reason: Scheduled follow-up? No   Insurance: [employer] Medicare  [facility] admissions x  1 ED visits x 1 in the last 6 months  06/23/20-06/28/20 [facility] discharged home per Epic notes with hospice  Outreach attempt # 2 successful    Mrs Kenneth Patrick, wife is able to verify HIPAA ([place] and Accountability Act) identifiers Reviewed and addressed the purpose of the follow up call with the her  Consent: [facility] RN CM reviewed [facility] services with patient.'s wife Patient's wife gave verbal consent for services.  EMMI  Mrs Kenneth Patrick reports that the answer to the EMMI questions -Scheduled follow up ? No was correct as at this time Mr Kenneth Patrick is under [place] care and will not be able to go to any appointments She reports all of his office visit appointments have been cancelled  She reports that his primary care provider (PCP) and the hospice MD outreach to them and they have been visited by the hospice care staff, a Education officer, museum and a minister this week.   [facility] RN CM assessed for needs for support, food, durable medical equipment (DME), medications. Mrs Kenneth Patrick reports at this time they are not in need of any of these. Has hospital bed, eyeglasses, 02, wheelchair, bedside commode and shower chair. Is DNR   Past Medical History:  Diagnosis Date  . Cancer ([place])    basal cell   . COPD (chronic obstructive pulmonary disease) (HCC)    uses inhalers  . Depression   . Diabetic neuropathy ([place])   . DM2 (diabetes mellitus, type 2) (HCC)    insulin dependent  . Dyspnea    with exertion  . Hypercholesterolemia   . Low back pain    no meds  . Respiratory infection   . Sleep apnea    uses CPAP     plans case closure Pt cared for by [facility] and no  further needs identified Pt encouraged to return a call to [facility] RN CM prn     Kenneth Patrick L. Lavina Hamman, RN, BSN, [place] Coordinator Office number [phone] Main [facility] number [phone] Fax number [phone]

## 2020-07-10 DIAGNOSIS — Z299 Encounter for prophylactic measures, unspecified: Secondary | ICD-10-CM | POA: Diagnosis not present

## 2020-07-10 DIAGNOSIS — E119 Type 2 diabetes mellitus without complications: Secondary | ICD-10-CM | POA: Diagnosis not present

## 2020-07-10 DIAGNOSIS — C3490 Malignant neoplasm of unspecified part of unspecified bronchus or lung: Secondary | ICD-10-CM | POA: Diagnosis not present

## 2020-07-10 DIAGNOSIS — Z09 Encounter for follow-up examination after completed treatment for conditions other than malignant neoplasm: Secondary | ICD-10-CM | POA: Diagnosis not present

## 2020-07-10 DIAGNOSIS — J9600 Acute respiratory failure, unspecified whether with hypoxia or hypercapnia: Secondary | ICD-10-CM | POA: Diagnosis not present

## 2020-07-17 ENCOUNTER — Ambulatory Visit: Payer: Medicare Other | Admitting: "Endocrinology

## 2020-08-05 DEATH — deceased

## 2021-04-16 ENCOUNTER — Ambulatory Visit: Payer: Medicare Other | Admitting: Cardiology
